# Patient Record
Sex: Male | Born: 1965
Health system: Southern US, Community
[De-identification: ages and names within clinical notes are randomized; demographics above are authoritative.]

## PROBLEM LIST (undated history)

## (undated) DIAGNOSIS — R519 Headache, unspecified: Secondary | ICD-10-CM

## (undated) DIAGNOSIS — I10 Essential (primary) hypertension: Secondary | ICD-10-CM

## (undated) DIAGNOSIS — E785 Hyperlipidemia, unspecified: Secondary | ICD-10-CM

## (undated) DIAGNOSIS — K429 Umbilical hernia without obstruction or gangrene: Secondary | ICD-10-CM

## (undated) DIAGNOSIS — J189 Pneumonia, unspecified organism: Secondary | ICD-10-CM

## (undated) DIAGNOSIS — A159 Respiratory tuberculosis unspecified: Secondary | ICD-10-CM

## (undated) DIAGNOSIS — Z8709 Personal history of other diseases of the respiratory system: Secondary | ICD-10-CM

## (undated) DIAGNOSIS — M1990 Unspecified osteoarthritis, unspecified site: Secondary | ICD-10-CM

## (undated) DIAGNOSIS — F259 Schizoaffective disorder, unspecified: Secondary | ICD-10-CM

## (undated) DIAGNOSIS — J449 Chronic obstructive pulmonary disease, unspecified: Secondary | ICD-10-CM

## (undated) DIAGNOSIS — R42 Dizziness and giddiness: Secondary | ICD-10-CM

## (undated) DIAGNOSIS — F329 Major depressive disorder, single episode, unspecified: Secondary | ICD-10-CM

## (undated) DIAGNOSIS — R51 Headache: Secondary | ICD-10-CM

## (undated) DIAGNOSIS — F319 Bipolar disorder, unspecified: Secondary | ICD-10-CM

## (undated) DIAGNOSIS — E119 Type 2 diabetes mellitus without complications: Secondary | ICD-10-CM

## (undated) DIAGNOSIS — K219 Gastro-esophageal reflux disease without esophagitis: Secondary | ICD-10-CM

## (undated) DIAGNOSIS — F32A Depression, unspecified: Secondary | ICD-10-CM

## (undated) DIAGNOSIS — F419 Anxiety disorder, unspecified: Secondary | ICD-10-CM

## (undated) DIAGNOSIS — F1910 Other psychoactive substance abuse, uncomplicated: Secondary | ICD-10-CM

## (undated) DIAGNOSIS — K649 Unspecified hemorrhoids: Secondary | ICD-10-CM

## (undated) DIAGNOSIS — M2550 Pain in unspecified joint: Secondary | ICD-10-CM

## (undated) DIAGNOSIS — M2540 Effusion, unspecified joint: Secondary | ICD-10-CM

## (undated) DIAGNOSIS — M549 Dorsalgia, unspecified: Secondary | ICD-10-CM

## (undated) HISTORY — DX: Headache: R51

## (undated) HISTORY — DX: Headache, unspecified: R51.9

## (undated) HISTORY — PX: TOTAL KNEE ARTHROPLASTY: SHX125

## (undated) HISTORY — PX: ESOPHAGOGASTRODUODENOSCOPY: SHX1529

## (undated) HISTORY — DX: Unspecified osteoarthritis, unspecified site: M19.90

## (undated) HISTORY — PX: JOINT REPLACEMENT: SHX530

## (undated) HISTORY — DX: Bipolar disorder, unspecified: F31.9

## (undated) HISTORY — PX: LIPOMA EXCISION: SHX5283

---

## 1998-03-07 ENCOUNTER — Emergency Department (HOSPITAL_COMMUNITY): Admission: EM | Admit: 1998-03-07 | Discharge: 1998-03-07 | Payer: Self-pay | Admitting: Emergency Medicine

## 1998-03-12 ENCOUNTER — Emergency Department (HOSPITAL_COMMUNITY): Admission: EM | Admit: 1998-03-12 | Discharge: 1998-03-12 | Payer: Self-pay | Admitting: Emergency Medicine

## 1998-03-19 ENCOUNTER — Emergency Department (HOSPITAL_COMMUNITY): Admission: EM | Admit: 1998-03-19 | Discharge: 1998-03-19 | Payer: Self-pay | Admitting: Emergency Medicine

## 1998-05-26 ENCOUNTER — Ambulatory Visit (HOSPITAL_COMMUNITY): Admission: RE | Admit: 1998-05-26 | Discharge: 1998-05-26 | Payer: Self-pay | Admitting: Family Medicine

## 1999-05-25 ENCOUNTER — Ambulatory Visit (HOSPITAL_COMMUNITY): Admission: RE | Admit: 1999-05-25 | Discharge: 1999-05-25 | Payer: Self-pay | Admitting: *Deleted

## 1999-11-26 ENCOUNTER — Emergency Department (HOSPITAL_COMMUNITY): Admission: EM | Admit: 1999-11-26 | Discharge: 1999-11-26 | Payer: Self-pay | Admitting: Emergency Medicine

## 2001-10-01 ENCOUNTER — Emergency Department (HOSPITAL_COMMUNITY): Admission: EM | Admit: 2001-10-01 | Discharge: 2001-10-01 | Payer: Self-pay

## 2001-10-06 ENCOUNTER — Emergency Department (HOSPITAL_COMMUNITY): Admission: EM | Admit: 2001-10-06 | Discharge: 2001-10-06 | Payer: Self-pay | Admitting: Emergency Medicine

## 2002-04-08 ENCOUNTER — Emergency Department (HOSPITAL_COMMUNITY): Admission: EM | Admit: 2002-04-08 | Discharge: 2002-04-08 | Payer: Self-pay | Admitting: *Deleted

## 2003-01-07 ENCOUNTER — Emergency Department (HOSPITAL_COMMUNITY): Admission: EM | Admit: 2003-01-07 | Discharge: 2003-01-07 | Payer: Self-pay | Admitting: Emergency Medicine

## 2004-07-16 ENCOUNTER — Emergency Department (HOSPITAL_COMMUNITY): Admission: EM | Admit: 2004-07-16 | Discharge: 2004-07-16 | Payer: Self-pay | Admitting: Emergency Medicine

## 2005-05-06 ENCOUNTER — Emergency Department (HOSPITAL_COMMUNITY): Admission: EM | Admit: 2005-05-06 | Discharge: 2005-05-06 | Payer: Self-pay | Admitting: Emergency Medicine

## 2005-07-14 ENCOUNTER — Emergency Department (HOSPITAL_COMMUNITY): Admission: EM | Admit: 2005-07-14 | Discharge: 2005-07-14 | Payer: Self-pay | Admitting: Family Medicine

## 2005-08-05 ENCOUNTER — Emergency Department (HOSPITAL_COMMUNITY): Admission: EM | Admit: 2005-08-05 | Discharge: 2005-08-06 | Payer: Self-pay | Admitting: Emergency Medicine

## 2005-08-12 ENCOUNTER — Emergency Department (HOSPITAL_COMMUNITY): Admission: EM | Admit: 2005-08-12 | Discharge: 2005-08-12 | Payer: Self-pay | Admitting: Family Medicine

## 2007-07-22 ENCOUNTER — Emergency Department (HOSPITAL_COMMUNITY): Admission: EM | Admit: 2007-07-22 | Discharge: 2007-07-22 | Payer: Self-pay | Admitting: Emergency Medicine

## 2008-08-31 ENCOUNTER — Emergency Department (HOSPITAL_COMMUNITY): Admission: EM | Admit: 2008-08-31 | Discharge: 2008-09-01 | Payer: Self-pay | Admitting: Emergency Medicine

## 2008-09-01 ENCOUNTER — Ambulatory Visit: Payer: Self-pay | Admitting: *Deleted

## 2008-09-01 ENCOUNTER — Inpatient Hospital Stay (HOSPITAL_COMMUNITY): Admission: RE | Admit: 2008-09-01 | Discharge: 2008-09-07 | Payer: Self-pay | Admitting: *Deleted

## 2008-12-20 ENCOUNTER — Encounter: Payer: Self-pay | Admitting: Family Medicine

## 2008-12-20 ENCOUNTER — Ambulatory Visit: Payer: Self-pay | Admitting: Family Medicine

## 2008-12-20 DIAGNOSIS — J209 Acute bronchitis, unspecified: Secondary | ICD-10-CM | POA: Insufficient documentation

## 2009-01-31 ENCOUNTER — Ambulatory Visit: Payer: Self-pay | Admitting: Family Medicine

## 2009-01-31 DIAGNOSIS — R0789 Other chest pain: Secondary | ICD-10-CM | POA: Insufficient documentation

## 2009-01-31 DIAGNOSIS — F17200 Nicotine dependence, unspecified, uncomplicated: Secondary | ICD-10-CM | POA: Insufficient documentation

## 2009-01-31 DIAGNOSIS — F319 Bipolar disorder, unspecified: Secondary | ICD-10-CM | POA: Insufficient documentation

## 2009-01-31 DIAGNOSIS — R030 Elevated blood-pressure reading, without diagnosis of hypertension: Secondary | ICD-10-CM | POA: Insufficient documentation

## 2009-01-31 DIAGNOSIS — Z9189 Other specified personal risk factors, not elsewhere classified: Secondary | ICD-10-CM | POA: Insufficient documentation

## 2009-01-31 DIAGNOSIS — L259 Unspecified contact dermatitis, unspecified cause: Secondary | ICD-10-CM | POA: Insufficient documentation

## 2009-02-01 ENCOUNTER — Telehealth: Payer: Self-pay | Admitting: Family Medicine

## 2009-02-01 ENCOUNTER — Encounter: Payer: Self-pay | Admitting: Family Medicine

## 2009-02-01 ENCOUNTER — Ambulatory Visit: Payer: Self-pay | Admitting: Family Medicine

## 2009-02-02 LAB — CONVERTED CEMR LAB
ALT: 17 units/L (ref 0–53)
Albumin: 4.3 g/dL (ref 3.5–5.2)
CO2: 25 meq/L (ref 19–32)
Calcium: 9.7 mg/dL (ref 8.4–10.5)
Chloride: 105 meq/L (ref 96–112)
Cholesterol: 165 mg/dL (ref 0–200)
Glucose, Bld: 101 mg/dL — ABNORMAL HIGH (ref 70–99)
Platelets: 280 10*3/uL (ref 150–400)
Potassium: 5.3 meq/L (ref 3.5–5.3)
RBC: 5.16 M/uL (ref 4.22–5.81)
Sodium: 139 meq/L (ref 135–145)
Total Protein: 7.2 g/dL (ref 6.0–8.3)
VLDL: 10 mg/dL (ref 0–40)
WBC: 6 10*3/uL (ref 4.0–10.5)

## 2009-02-07 ENCOUNTER — Telehealth: Payer: Self-pay | Admitting: Family Medicine

## 2009-02-07 ENCOUNTER — Encounter: Payer: Self-pay | Admitting: Family Medicine

## 2009-02-07 ENCOUNTER — Ambulatory Visit (HOSPITAL_COMMUNITY): Admission: RE | Admit: 2009-02-07 | Discharge: 2009-02-07 | Payer: Self-pay | Admitting: Family Medicine

## 2009-02-13 ENCOUNTER — Ambulatory Visit: Payer: Self-pay | Admitting: Family Medicine

## 2009-02-13 ENCOUNTER — Ambulatory Visit (HOSPITAL_COMMUNITY): Admission: RE | Admit: 2009-02-13 | Discharge: 2009-02-13 | Payer: Self-pay | Admitting: Family Medicine

## 2009-02-14 ENCOUNTER — Emergency Department (HOSPITAL_COMMUNITY): Admission: EM | Admit: 2009-02-14 | Discharge: 2009-02-14 | Payer: Self-pay | Admitting: Emergency Medicine

## 2009-02-17 ENCOUNTER — Encounter: Payer: Self-pay | Admitting: Family Medicine

## 2009-03-01 ENCOUNTER — Ambulatory Visit: Payer: Self-pay | Admitting: Sports Medicine

## 2009-03-09 ENCOUNTER — Emergency Department (HOSPITAL_COMMUNITY): Admission: EM | Admit: 2009-03-09 | Discharge: 2009-03-09 | Payer: Self-pay | Admitting: Emergency Medicine

## 2009-03-29 ENCOUNTER — Telehealth: Payer: Self-pay | Admitting: Family Medicine

## 2009-03-30 ENCOUNTER — Telehealth: Payer: Self-pay | Admitting: Family Medicine

## 2009-04-06 ENCOUNTER — Telehealth: Payer: Self-pay | Admitting: Family Medicine

## 2009-04-17 ENCOUNTER — Encounter: Payer: Self-pay | Admitting: Family Medicine

## 2009-04-25 ENCOUNTER — Ambulatory Visit: Payer: Self-pay | Admitting: Family Medicine

## 2009-04-25 DIAGNOSIS — M545 Low back pain, unspecified: Secondary | ICD-10-CM | POA: Insufficient documentation

## 2009-04-27 ENCOUNTER — Encounter: Payer: Self-pay | Admitting: Family Medicine

## 2009-05-01 ENCOUNTER — Encounter: Payer: Self-pay | Admitting: Family Medicine

## 2009-05-18 ENCOUNTER — Ambulatory Visit: Payer: Self-pay | Admitting: Family Medicine

## 2009-05-18 DIAGNOSIS — D1739 Benign lipomatous neoplasm of skin and subcutaneous tissue of other sites: Secondary | ICD-10-CM | POA: Insufficient documentation

## 2009-05-18 DIAGNOSIS — S143XXA Injury of brachial plexus, initial encounter: Secondary | ICD-10-CM | POA: Insufficient documentation

## 2009-05-22 ENCOUNTER — Encounter: Payer: Self-pay | Admitting: Family Medicine

## 2009-05-23 ENCOUNTER — Encounter: Payer: Self-pay | Admitting: Family Medicine

## 2009-05-25 ENCOUNTER — Telehealth: Payer: Self-pay | Admitting: Family Medicine

## 2009-05-25 ENCOUNTER — Encounter: Payer: Self-pay | Admitting: Family Medicine

## 2009-06-06 ENCOUNTER — Emergency Department (HOSPITAL_COMMUNITY): Admission: EM | Admit: 2009-06-06 | Discharge: 2009-06-07 | Payer: Self-pay | Admitting: Emergency Medicine

## 2009-07-04 ENCOUNTER — Encounter: Admission: RE | Admit: 2009-07-04 | Discharge: 2009-07-04 | Payer: Self-pay | Admitting: Surgery

## 2009-07-06 ENCOUNTER — Encounter (INDEPENDENT_AMBULATORY_CARE_PROVIDER_SITE_OTHER): Payer: Self-pay | Admitting: Surgery

## 2009-07-06 ENCOUNTER — Ambulatory Visit (HOSPITAL_BASED_OUTPATIENT_CLINIC_OR_DEPARTMENT_OTHER): Admission: RE | Admit: 2009-07-06 | Discharge: 2009-07-06 | Payer: Self-pay | Admitting: Surgery

## 2009-08-01 ENCOUNTER — Ambulatory Visit: Payer: Self-pay | Admitting: Family Medicine

## 2009-08-31 ENCOUNTER — Emergency Department (HOSPITAL_COMMUNITY): Admission: EM | Admit: 2009-08-31 | Discharge: 2009-08-31 | Payer: Self-pay | Admitting: Emergency Medicine

## 2009-09-07 ENCOUNTER — Encounter: Payer: Self-pay | Admitting: Family Medicine

## 2009-09-07 ENCOUNTER — Emergency Department (HOSPITAL_COMMUNITY): Admission: EM | Admit: 2009-09-07 | Discharge: 2009-09-07 | Payer: Self-pay | Admitting: Emergency Medicine

## 2010-02-13 ENCOUNTER — Encounter: Payer: Self-pay | Admitting: Family Medicine

## 2010-02-13 ENCOUNTER — Ambulatory Visit: Payer: Self-pay | Admitting: Family Medicine

## 2010-02-13 DIAGNOSIS — F528 Other sexual dysfunction not due to a substance or known physiological condition: Secondary | ICD-10-CM | POA: Insufficient documentation

## 2010-02-13 LAB — CONVERTED CEMR LAB: Testosterone: 459.89 ng/dL (ref 350–890)

## 2010-02-14 ENCOUNTER — Telehealth: Payer: Self-pay | Admitting: Family Medicine

## 2010-02-22 ENCOUNTER — Telehealth: Payer: Self-pay | Admitting: Family Medicine

## 2010-03-14 ENCOUNTER — Emergency Department (HOSPITAL_COMMUNITY): Admission: EM | Admit: 2010-03-14 | Discharge: 2010-03-14 | Payer: Self-pay | Admitting: Family Medicine

## 2010-03-20 ENCOUNTER — Ambulatory Visit: Payer: Self-pay | Admitting: Family Medicine

## 2010-03-20 ENCOUNTER — Encounter: Payer: Self-pay | Admitting: Family Medicine

## 2010-03-20 DIAGNOSIS — S6990XA Unspecified injury of unspecified wrist, hand and finger(s), initial encounter: Secondary | ICD-10-CM | POA: Insufficient documentation

## 2010-03-30 ENCOUNTER — Encounter: Payer: Self-pay | Admitting: Family Medicine

## 2010-03-30 ENCOUNTER — Ambulatory Visit: Payer: Self-pay | Admitting: Family Medicine

## 2010-03-30 DIAGNOSIS — N139 Obstructive and reflux uropathy, unspecified: Secondary | ICD-10-CM | POA: Insufficient documentation

## 2010-04-02 LAB — CONVERTED CEMR LAB: PSA: 1.5 ng/mL (ref 0.10–4.00)

## 2010-04-04 ENCOUNTER — Telehealth: Payer: Self-pay | Admitting: Family Medicine

## 2010-06-24 ENCOUNTER — Emergency Department (HOSPITAL_COMMUNITY): Admission: EM | Admit: 2010-06-24 | Discharge: 2010-06-24 | Payer: Self-pay | Admitting: Emergency Medicine

## 2010-07-02 ENCOUNTER — Telehealth: Payer: Self-pay | Admitting: Family Medicine

## 2010-07-09 ENCOUNTER — Emergency Department (HOSPITAL_COMMUNITY): Admission: EM | Admit: 2010-07-09 | Discharge: 2010-07-09 | Payer: Self-pay | Admitting: Family Medicine

## 2010-07-09 ENCOUNTER — Encounter: Payer: Self-pay | Admitting: Family Medicine

## 2010-10-17 ENCOUNTER — Ambulatory Visit: Admit: 2010-10-17 | Payer: Self-pay

## 2010-11-05 ENCOUNTER — Encounter: Payer: Self-pay | Admitting: Family Medicine

## 2010-11-05 ENCOUNTER — Ambulatory Visit
Admission: RE | Admit: 2010-11-05 | Discharge: 2010-11-05 | Payer: Self-pay | Source: Home / Self Care | Attending: Family Medicine | Admitting: Family Medicine

## 2010-11-05 DIAGNOSIS — R05 Cough: Secondary | ICD-10-CM | POA: Insufficient documentation

## 2010-11-05 DIAGNOSIS — IMO0001 Reserved for inherently not codable concepts without codable children: Secondary | ICD-10-CM | POA: Insufficient documentation

## 2010-11-05 LAB — CONVERTED CEMR LAB
Hemoglobin: 15.4 g/dL (ref 13.0–17.0)
RBC: 4.93 M/uL (ref 4.22–5.81)
RDW: 14.1 % (ref 11.5–15.5)
WBC: 6.9 10*3/uL (ref 4.0–10.5)

## 2010-11-05 LAB — CBC
Hemoglobin: 15.4 g/dL (ref 13.0–17.0)
MCV: 94.7 fL (ref 78.0–100.0)
Platelets: 279 10*3/uL (ref 150–400)
RBC: 4.93 MIL/uL (ref 4.22–5.81)
WBC: 6.9 10*3/uL (ref 4.0–10.5)

## 2010-11-06 ENCOUNTER — Ambulatory Visit (HOSPITAL_COMMUNITY)
Admission: RE | Admit: 2010-11-06 | Discharge: 2010-11-06 | Payer: Self-pay | Source: Home / Self Care | Attending: Family Medicine | Admitting: Family Medicine

## 2010-11-06 NOTE — Progress Notes (Signed)
Summary: triage  Phone Note Call from Patient Call back at Home Phone [phone]   Caller: Patient Summary of Call: Pt saying he has a breathing problem and needs to be seen also a rash. Initial call taken by: Clydell Hakim,  July 02, 2010 2:24 PM  Follow-up for Phone Call        states he has been sob x 2 wks. usually smokes 3-4 cigarettes per day.  told him we have no appts . he will use UC today Follow-up by: Golden Circle RN,  July 02, 2010 2:26 PM

## 2010-11-06 NOTE — Progress Notes (Signed)
Summary: pls call  Phone Note Call from Patient Call back at [phone]   Caller: Patient Summary of Call: wants to talk to Dr Wallene Huh - wants cheaper meds - cannot afford one given him Initial call taken by: De Nurse,  Feb 14, 2010 9:02 AM  Follow-up for Phone Call        Discussed that PDE-I meds are generally expensive. Discussed that he should talk with the physician that prescribed his neurontin about a trial of holding this medication to see if it might help.  Follow-up by: Bobby Rumpf  MD,  Feb 14, 2010 12:13 PM

## 2010-11-06 NOTE — Miscellaneous (Signed)
Summary: he is at [facility]  Clinical Lists Changes UC called for NPI to see him. as we are out of appts,, I ok'd the visit.Golden Circle RN  July 09, 2010 1:39 PM

## 2010-11-06 NOTE — Assessment & Plan Note (Signed)
Summary: f/u UC visit - bilateral hand injury,df   Vital Signs:  Patient profile:   45 year old male Weight:      213.8 pounds Temp:     98.2 degrees F Pulse rate:   68 / minute BP sitting:   124 / 84  (right arm)  Vitals Entered By: Starleen Blue RN (March 20, 2010 1:41 PM) CC: f/u hands Is Patient Diabetic? No Pain Assessment Patient in pain? no        Primary Care Provider:  Bobby Rumpf  MD  CC:  f/u hands.  History of Present Illness: 1) Injury to hands: Seen at Urgent Care on 03/14/10 for puncture wounds from nails while at [employer] store to bilateral hands (sustained on 03/14/10). Received tetanus booster, doxycycline x 10 days, ibuprofen as needed. Bilateral films w/o foreign body or bony injury. Injuries were to dorsum of left index finger at PIP and dorsum of right hand at 4th MCP.  Pain has mostly improved. Patient denies pus, streaking erythema, fever, chills, decreased range of motion, loss of sensation, weakness. Needs a note from work stating extent of injuries and also clearing him for return to work.   Habits & Providers  Alcohol-Tobacco-Diet     Tobacco Status: current     Cigarette Packs/Day: 0.25  Current Medications (verified): 1)  Viagra 25 Mg Tabs (Sildenafil Citrate) .... One Tab By Mouth Qday As Needed For Intercourse 2)  Neurontin 300 Mg Caps (Gabapentin) .... One Tab By Mouth Three Times A Day 3)  Doxycycline Hyclate 100 Mg Tabs (Doxycycline Hyclate) .... One Tab By Mouth Two Times A Day X 10 Days  Allergies (verified): 1)  ! Penicillin  Social History: Packs/Day:  0.25  Review of Systems       as per HPI   Physical Exam  General:  NAD, pleasant  Mouth:  full ROM  Neck:  full ROM  Extremities:  hands with well healed wounds  ~ 5 mm in diameter at dorsum of left index finger at PIP and dorsum of right hand at 4th MCP.  Neurologic:  normal strength in fingers; sensation intact in fingers    Impression & Recommendations:  Problem # 1:   HAND INJURY (ICD-959.4) Assessment New  Healing well w/o sequelae. No further intervention required. Continue doxycycline for complete course. Ibuprofen as needed. Note written to clear patient for work today.   Orders: [facility]- Est Level  3 (81191)  Complete Medication List: 1)  Viagra 25 Mg Tabs (Sildenafil citrate) .... One tab by mouth qday as needed for intercourse 2)  Neurontin 300 Mg Caps (Gabapentin) .... One tab by mouth three times a day 3)  Doxycycline Hyclate 100 Mg Tabs (Doxycycline hyclate) .... One tab by mouth two times a day x 10 days

## 2010-11-06 NOTE — Progress Notes (Signed)
Summary: Rx Req  Phone Note Call from Patient Call back at [phone]   Caller: Patient Summary of Call: Would like his Vigria sent to [facility] [address]. Initial call taken by: Clydell Hakim,  Feb 22, 2010 11:46 AM    Prescriptions: VIAGRA 25 MG TABS (SILDENAFIL CITRATE) one tab by mouth qday as needed for intercourse  #30 x 1   Entered and Authorized by:   Bobby Rumpf  MD   Signed by:   Bobby Rumpf  MD on 02/23/2010   Method used:   Electronically to        [facility] [phone]* (retail)       [address]       [place], [place]  [address]       Ph: [phone]       Fax: [phone]   RxID:   [id number]   Appended Document: Rx Req LM that rx ready for pick up.

## 2010-11-06 NOTE — Assessment & Plan Note (Signed)
Summary: stress problems,df   Vital Signs:  Patient profile:   45 year old male Height:      68 inches Weight:      218.2 pounds BMI:     33.30 Temp:     98.3 degrees F oral Pulse rate:   97 / minute BP sitting:   128 / 87  (left arm) Cuff size:   regular  Vitals Entered By: Garen Grams LPN (Feb 13, 2010 2:35 PM) CC: erectile dysfunction  Is Patient Diabetic? No Pain Assessment Patient in pain? no        Primary Care Provider:  Bobby Rumpf  MD  CC:  erectile dysfunction .  History of Present Illness: 1) Erectile dysfunction: Reports difficulty attaining / maintaining erection x 1 month. Recently incarcerated (released 1 month ago) - since then he has had above difficulties. Heterosexual. One partner. Denies penile pain, testicular pain / atrophy, gynecomastia, breast discharge. Does not think his problem may be psychogenic. Only meds are neurontin which he has been on for several months (not prescribed by me) and trazodone, which he has been on for at least 6 months. Denies alcohol, drug use. Reports tobacco use, one cigarette per day.   Habits & Providers  Alcohol-Tobacco-Diet     Tobacco Status: current     Tobacco Counseling: to quit use of tobacco products  Current Medications (verified): 1)  Viagra 25 Mg Tabs (Sildenafil Citrate) .... One Tab By Mouth Qday As Needed For Intercourse 2)  Neurontin 300 Mg Caps (Gabapentin) .... One Tab By Mouth Three Times A Day  Allergies (verified): 1)  ! Penicillin  Physical Exam  General:  vitals reviewed.  NAD  Breasts:  no gynecomastia and no masses.   Lungs:  Normal respiratory effort, chest expands symmetrically. Lungs are clear to auscultation, no crackles or wheezes. Heart:  Normal rate and regular rhythm. S1 and S2 normal without gallop, murmur, click, rub or other extra sounds. Abdomen:  Bowel sounds positive,abdomen soft and non-tender without masses, organomegaly or hernias noted. Genitalia:  circumcised, no  hydrocele, no scrotal masses, no testicular masses or atrophy or pain, and no urethral discharge.  no external abnormalities of penis. Msk:  No deformity or scoliosis noted of thoracic or lumbar spine.   Neurologic:  alert & oriented X3, cranial nerves II-XII intact, strength normal in all extremities, sensation intact to light touch, sensation intact to pinprick, and DTRs symmetrical and normal.     Impression & Recommendations:  Problem # 1:  ERECTILE DYSFUNCTION, PSYCHOGENIC (ICD-302.72) Assessment New Suspect psychogenic vs iatrogenic (on Neurontin) ED. Will check testosterone level, but exam and history not consistent with hormone abnormalities. Will prescribe Viagra at patient request. Advised regarding discussing issue with partner as well as he has been doing to see if this might be helpful. Follow up in 4-6 months.   His updated medication list for this problem includes:    Viagra 25 Mg Tabs (Sildenafil citrate) ..... One tab by mouth qday as needed for intercourse  Orders: Testosterone-[facility] ([id number]) [facility]- Est Level  3 (13086)  Complete Medication List: 1)  Viagra 25 Mg Tabs (Sildenafil citrate) .... One tab by mouth qday as needed for intercourse 2)  Neurontin 300 Mg Caps (Gabapentin) .... One tab by mouth three times a day Prescriptions: VIAGRA 25 MG TABS (SILDENAFIL CITRATE) one tab by mouth qday as needed for intercourse  #30 x 1   Entered and Authorized by:   Bobby Rumpf  MD   Signed by:  Bobby Rumpf  MD on 02/13/2010   Method used:   Electronically to        [facility] [address] [phone]* (retail)       [address]       [place], [place]  [id number]       Ph: [phone] or [phone]       Fax: [phone]   RxID:   [id number]

## 2010-11-06 NOTE — Assessment & Plan Note (Signed)
Summary: urinary obstruction/eo   Vital Signs:  Patient profile:   45 year old male Weight:      213.6 pounds Temp:     98 degrees F oral Pulse rate:   81 / minute Pulse rhythm:   regular BP sitting:   116 / 71  (left arm) Cuff size:   large  Vitals Entered By: Garen Grams LPN (March 30, 2010 2:29 PM)  Primary Care Provider:  Bobby Rumpf  MD   History of Present Illness: 1) Occasional urinary obstruction: Notes occasional halting urinary stream (occurs less than once a week). Denies nocturia, increased frequency of urination, new medications, fever, dysuria, hematuria, back pain, saddle anesthesia. Unsure about family history of prostate cancer. Has a history of erectile dysfunction that started this year as well after he was released from prison.   Current Medications (verified): 1)  Viagra 25 Mg Tabs (Sildenafil Citrate) .... One Tab By Mouth Qday As Needed For Intercourse 2)  Neurontin 300 Mg Caps (Gabapentin) .... One Tab By Mouth Three Times A Day  Allergies (verified): 1)  ! Penicillin  Physical Exam  General:  NAD, pleasant  Rectal:  DECLINES rectal exam  Genitalia:  circumcised, no hydrocele, no scrotal masses, no testicular masses or atrophy or pain, and no urethral discharge.  no external abnormalities of penis.   Impression & Recommendations:  Problem # 1:  URINARY OBSTRUCTION UNSPECIFIED (ICD-599.60) Assessment New Mild at most. Uncertain etiology. Declines exam. No meds on list that would cause. Patient requests "test for prostate cancer". Will perform PSA today given symptoms, patient reluctance to do exam, patient desire for PSA test for prostate cancer as well. Discussed risk/benefit. Will proceed with testing.   Orders: PSA-[facility] ([id number]) [facility]- Est Level  3 (09811)  Complete Medication List: 1)  Viagra 25 Mg Tabs (Sildenafil citrate) .... One tab by mouth qday as needed for intercourse 2)  Neurontin 300 Mg Caps (Gabapentin) .... One tab by mouth three  times a day

## 2010-11-06 NOTE — Progress Notes (Signed)
Summary: phn msg  Phone Note Other Incoming Call back at [phone] Ext [phone]   Caller: [employer] Claims Summary of Call: Needs to talk to Dr. Wallene Huh about letter that was written.  Needs clarification on some items in the letter.    Initial call taken by: Clydell Hakim,  April 04, 2010 3:25 PM  Follow-up for Phone Call        Called to clarify - date of office visit was incorrect - clarified this with claims adjuster. Dates in letter that patient was out of work due to this injury are correct and were dates given to me by patient's supervisor as the dates that should be listed in the letter.  Follow-up by: Bobby Rumpf  MD,  April 04, 2010 3:51 PM

## 2010-11-06 NOTE — Letter (Signed)
Summary: Generic Letter  [facility]  [address]   [place], [place] [address]   Phone: [phone]  Fax: [phone]    03/20/2010  Ammar Lamorte [address] [place], [place]  [address]  To whom it may concern:  This is to certify that the aforementioned patient, Andrew [name], was seen on 03/22/10 at [facility] for injuries sustained to bilateral hands on 03/14/10 and that these injuries were examined and found to be healing well without sequelae. Please note that the aforementioned patient should be excused from work from 03/15/10 to 03/19/10 as a result of these injuries and is currently cleared to return to work starting today 03/20/10. Please feel free to direct any correspondence or questions to the address and/or phone number provided.            Sincerely,   Bobby Rumpf  MD

## 2010-11-14 NOTE — Assessment & Plan Note (Signed)
Summary: chest pain,df   Vital Signs:  Patient profile:   45 year old male Height:      68 inches Weight:      204.9 pounds BMI:     31.27 Temp:     97.9 degrees F oral Pulse rate:   69 / minute BP sitting:   121 / 84  (left arm) Cuff size:   large  Vitals Entered By: Jimmy Footman, CMA (November 05, 2010 10:25 AM) CC: chest pain, cough(wet), sore throat, bodyaches x2 weeks Is Patient Diabetic? No   Primary Provider:  Bobby Rumpf  MD  CC:  chest pain, cough(wet), sore throat, and bodyaches x2 weeks.  History of Present Illness: Pt presents today with a history of a cough, present for 4-5 months, productive for about the past 4-6 weeks, body aches and chest pain for 2-3 weeks, and occasional wheezing with exertion over the past several months.  Pt denies fevers/chills/headache/visual changes/SOB/nausea/vomiting/diarrhea but complains of back pain, neck pain, and low energy.  No changes in weight recently.  He has tried naproxen, ibuprofen, and percocet for pain, with only percocent helping.  He has not tried tylenol and does not like taking it.  No recent injuries.  Preventive Screening-Counseling & Management  Alcohol-Tobacco     Smoking Status: current  Current Medications (verified): 1)  Viagra 25 Mg Tabs (Sildenafil Citrate) .... One Tab By Mouth Qday As Needed For Intercourse 2)  Neurontin 300 Mg Caps (Gabapentin) .... One Tab By Mouth Three Times A Day 3)  Meloxicam 15 Mg Tabs (Meloxicam) .... Take One Daily For Body Aches  Allergies (verified): 1)  ! Penicillin  Past History:  Past medical, surgical, family and social histories (including risk factors) reviewed for relevance to current acute and chronic problems.  Past Medical History: Reviewed history from 12/20/2008 and no changes required. Bipolar disorder- pt being seen at [facility] for this problem  Family History: Reviewed history from 01/31/2009 and no changes required. Family History of CAD   -Father  MI age 83 Family History Diabetes 1st degree relative- brother and sister Family History Hypertension- father, mother  Social History: Reviewed history from 01/31/2009 and no changes required. Pt lives alone.  Per pt, he is on disability due to his mood disorder.  Worked in [employer] He enjoys watching movies or exercising 3 times per week.  He smokes less than 1/4 pack of cigarettes per day, drinks no alcohol, and quit using drugs -ie cocaine 6 months ago.   Review of Systems       The patient complains of chest pain.  The patient denies anorexia, fever, weight loss, weight gain, vision loss, decreased hearing, hoarseness, syncope, dyspnea on exertion, peripheral edema, prolonged cough, headaches, hemoptysis, abdominal pain, melena, hematochezia, severe indigestion/heartburn, hematuria, incontinence, muscle weakness, difficulty walking, and unusual weight change.    Physical Exam  General:  NAD, pleasant  Head:  normocephalic, atraumatic, and no abnormalities observed.   Neck:  full ROM  Chest Wall:  no deformities.  Some tenderness to palpation throughout. Lungs:  Normal respiratory effort, chest expands symmetrically. Lungs are clear to auscultation, no crackles or wheezes. Heart:  Normal rate and regular rhythm. S1 and S2 normal without gallop, murmur, click, rub or other extra sounds. Abdomen:  Bowel sounds positive,abdomen soft without masses, organomegaly or hernias noted.  Some diffuse, non-focal tenderness in bilateral flanks. Msk:  normal ROM, no joint tenderness, and no joint swelling.  Some diffuse paraspinal tenderness. Pulses:  DP and  radial pulses 2+ bilaterally Extremities:  No edema bilaterally Neurologic:  alert & oriented X3, cranial nerves II-XII intact, strength normal in all extremities, and sensation intact to light touch.   Skin:  color normal, no rashes, no suspicious lesions, and tattoo(s).     Impression & Recommendations:  Problem # 1:  MYALGIA  (ICD-729.1) Feel that this patients body aches are not likely to be caused by a malignent process and is likely a prolonged viral syndrom.  Will obtain a CBC and CK to make certain he is not having significant muscle breakdown or other inflammatory process.  Will give meloxicam for his pain.  Pt did request percocet this visit.  Review of records indicates that the patient was given percocet in the past for an acute injury.  It was made evident in the note that this was a limited, one week supply.  Do not feel comfortable giving this medication without consultation with PCP.  Will recommend that he follow up with PCP in 1-2 weeks if he is not feeling better by that time.  Orders: CBC-[facility] (81191) CXR- 2view (CXR)  His updated medication list for this problem includes:    Meloxicam 15 Mg Tabs (Meloxicam) .Marland Kitchen... Take one daily for body aches  Problem # 2:  COUGH, CHRONIC (ICD-786.2) Due to the patient's history of chronic cough and smoking will order CXR to make certain that he does not have a significant pulmonary process causing his symptoms.  Do not think this is likely due to his normal physical exam, but in a patient with this history, it is hard to rule out the possibility of malignancy.  Orders: CBC-[facility] (47829) CXR- 2view (CXR)  Complete Medication List: 1)  Viagra 25 Mg Tabs (Sildenafil citrate) .... One tab by mouth qday as needed for intercourse 2)  Neurontin 300 Mg Caps (Gabapentin) .... One tab by mouth three times a day 3)  Meloxicam 15 Mg Tabs (Meloxicam) .... Take one daily for body aches  Other Orders: CK (Creatine Kinase)-[facility] [phone])  Patient Instructions: 1)  It was good to see you today. 2)  We will get some blood work to make sure you don't have something bad causing your symptoms.  I would also like to get a chest x-ray to make sure you don't have something bad causing your cough. 3)  We will try Mobic once daily for your pain. 4)  If you are still having these  problems in a week, we need to see you back so we can investigate further.  Try to see your regular doctor then. Prescriptions: MELOXICAM 15 MG TABS (MELOXICAM) Take one daily for body aches  #30 x 0   Entered and Authorized by:   Majel Homer MD   Signed by:   Majel Homer MD on 11/05/2010   Method used:   Print then Give to Patient   RxID:   [id number]    Orders Added: 1)  CBC-[facility] [85027] 2)  CK (Creatine Kinase)-[facility] [[id number]] 3)  CXR- 2view [CXR]  Appended Document: Orders Update    Clinical Lists Changes  Orders: Added new Test order of [facility]- Est Level  3 ([address]27) - Signed

## 2010-12-20 LAB — DIFFERENTIAL
Basophils Absolute: 0 10*3/uL (ref 0.0–0.1)
Basophils Relative: 0 % (ref 0–1)
Neutro Abs: 2.7 10*3/uL (ref 1.7–7.7)
Neutrophils Relative %: 47 % (ref 43–77)

## 2010-12-20 LAB — CBC
Hemoglobin: 16.2 g/dL (ref 13.0–17.0)
MCH: 32.9 pg (ref 26.0–34.0)
RBC: 4.92 MIL/uL (ref 4.22–5.81)

## 2010-12-20 LAB — BASIC METABOLIC PANEL
CO2: 25 mEq/L (ref 19–32)
Calcium: 9.2 mg/dL (ref 8.4–10.5)
GFR calc Af Amer: >60 mL/min (ref >60)
GFR calc non Af Amer: >60 mL/min (ref >60)
Sodium: 138 mEq/L (ref 135–145)

## 2010-12-20 LAB — CK TOTAL AND CKMB (NOT AT ARMC)
CK, MB: 2.7 ng/mL (ref 0.3–4.0)
Total CK: 391 U/L — ABNORMAL HIGH (ref 7–232)

## 2010-12-20 LAB — D-DIMER, QUANTITATIVE: D-Dimer, Quant: <0.22 ug/mL-FEU (ref 0.00–0.48)

## 2010-12-30 ENCOUNTER — Emergency Department (HOSPITAL_COMMUNITY)
Admission: EM | Admit: 2010-12-30 | Discharge: 2010-12-30 | Disposition: A | Discharge status: Home / Self Care | Payer: Medicare Other | Attending: Emergency Medicine | Admitting: Emergency Medicine

## 2010-12-30 ENCOUNTER — Emergency Department (HOSPITAL_COMMUNITY): Payer: Medicare Other

## 2010-12-30 DIAGNOSIS — M545 Low back pain, unspecified: Secondary | ICD-10-CM | POA: Insufficient documentation

## 2010-12-30 DIAGNOSIS — G8929 Other chronic pain: Secondary | ICD-10-CM | POA: Insufficient documentation

## 2010-12-30 DIAGNOSIS — I2510 Atherosclerotic heart disease of native coronary artery without angina pectoris: Secondary | ICD-10-CM | POA: Insufficient documentation

## 2010-12-30 DIAGNOSIS — R7309 Other abnormal glucose: Secondary | ICD-10-CM | POA: Insufficient documentation

## 2010-12-30 DIAGNOSIS — F3289 Other specified depressive episodes: Secondary | ICD-10-CM | POA: Insufficient documentation

## 2010-12-30 DIAGNOSIS — R1909 Other intra-abdominal and pelvic swelling, mass and lump: Secondary | ICD-10-CM | POA: Insufficient documentation

## 2010-12-30 DIAGNOSIS — R350 Frequency of micturition: Secondary | ICD-10-CM | POA: Insufficient documentation

## 2010-12-30 DIAGNOSIS — R109 Unspecified abdominal pain: Secondary | ICD-10-CM | POA: Insufficient documentation

## 2010-12-30 DIAGNOSIS — F329 Major depressive disorder, single episode, unspecified: Secondary | ICD-10-CM | POA: Insufficient documentation

## 2010-12-30 LAB — COMPREHENSIVE METABOLIC PANEL
AST: 26 U/L (ref 0–37)
Albumin: 3.6 g/dL (ref 3.5–5.2)
Chloride: 107 mEq/L (ref 96–112)
Creatinine, Ser: 0.85 mg/dL (ref 0.4–1.5)
GFR calc Af Amer: >60 mL/min (ref >60)
Potassium: 4.1 mEq/L (ref 3.5–5.1)
Total Bilirubin: 0.7 mg/dL (ref 0.3–1.2)
Total Protein: 6.3 g/dL (ref 6.0–8.3)

## 2010-12-30 LAB — URINALYSIS, ROUTINE W REFLEX MICROSCOPIC
Bilirubin Urine: NEGATIVE
Ketones, ur: NEGATIVE mg/dL
Nitrite: NEGATIVE
Specific Gravity, Urine: 1.025 (ref 1.005–1.030)
Urobilinogen, UA: 1 mg/dL (ref 0.0–1.0)

## 2010-12-30 LAB — DIFFERENTIAL
Basophils Relative: 1 % (ref 0–1)
Eosinophils Absolute: 0.2 10*3/uL (ref 0.0–0.7)
Neutrophils Relative %: 44 % (ref 43–77)

## 2010-12-30 LAB — CBC
MCH: 32 pg (ref 26.0–34.0)
Platelets: 255 10*3/uL (ref 150–400)
RBC: 4.75 MIL/uL (ref 4.22–5.81)
WBC: 5.3 10*3/uL (ref 4.0–10.5)

## 2011-01-04 ENCOUNTER — Ambulatory Visit (INDEPENDENT_AMBULATORY_CARE_PROVIDER_SITE_OTHER): Payer: Medicare Other | Admitting: Family Medicine

## 2011-01-04 VITALS — BP 124/53 | HR 61 | Temp 98.2°F | Wt 211.2 lb

## 2011-01-04 DIAGNOSIS — K439 Ventral hernia without obstruction or gangrene: Secondary | ICD-10-CM

## 2011-01-04 NOTE — Progress Notes (Signed)
  Subjective:    Patient ID: Andrew [name], male    DOB: 16-Apr-1966, 45 y.o.   MRN: [id number]  HPI  1) "Knot" on abdomen: Seen 12/30/10 for abdominal mass - noticed a small "knot" just above his umbilicus while he was showering - was not painful, but as he thought more about it, he felt as though he was having sharp pains in that area so he went to the ER. Review of ER report reveals diagnosis of likely abdominal wall hernia. Labs and studies at that time did not show any acute event (report of hyperglycemia on complete metabolic panel with glucose of 125 but patient reports that he had eaten shortly before going to ER). Denies nausea, emesis, diarrhea, constipation , melena, hematochezia.   Review of Systems As per HPI also positive for chronic low back pain.     Objective:   Physical Exam  Constitutional: He appears well-developed and well-nourished.  Cardiovascular: Normal rate and regular rhythm.   Abdominal: Soft. Bowel sounds are normal. He exhibits no distension. There is no tenderness. There is no rebound and no guarding.       2 cm by 2 cm abdominal wall hernia easily reducible just superior to umbilicus, non tender           Assessment & Plan:

## 2011-01-04 NOTE — Patient Instructions (Signed)
  Follow up at next appointment - we will check labs then.    Hernia A hernia occurs when an internal organ pushes out through a weak spot in the belly (abdominal) wall. Hernias most commonly occur in the groin and around the navel. Hernias also can occur through by cut (incision) made by the surgeon after an abdominal operation. Hernias often can be pushed back into place (reduced). Most hernias tend to get worse over time. Problems occur when abdominal contents get stuck in the opening (incarcerated hernia). The blood supply becomes blocked or impaired (strangulated hernia). Because of these risks, you may require surgery to repair the hernia. CAUSES  Heavy lifting.   Prolonged coughing.   Straining to move your bowels.   Hernias can also occur through a cut (incision) by a surgeon after an abdominal operation.  HOME CARE INSTRUCTIONS  Bed rest is not required. You may continue your normal activities. Avoid heavy lifting (more than 10 pounds) or straining. Cough gently. If you are a smoker it is best to stop. Even the best hernia repair can break down with the continual strain of coughing. Even if you do not have your hernia repaired, a cough will continue to aggravate the problem.   Do not wear anything tight over your hernia. Do not try to keep it in with an outside bandage or truss. These can damage abdominal contents if they are trapped within the hernia sac.   Eat a normal diet. Avoid constipation. Straining over long periods of time will increase hernia size and encourage breakdown of repairs. If you cannot do this with diet alone, stool softeners may be used.  SEEK IMMEDIATE MEDICAL CARE IF: You have problems (symptoms) of a trapped (incarcerated) hernia.   You develop increasing abdominal pain.   You feel sick to your stomach (nausea) and vomiting.   The hernia is stuck outside the abdomen, looks discolored, feels hard, or is tender.   You have any changes in your bowel habits  or in the hernia that is unusual for you.   You have increased pain or swelling around the hernia.   You cannot push the hernia back in place by applying gentle pressure while lying down.  MAKE SURE YOU:   Understand these instructions.   Will watch your condition.   Will get help right away if you are not doing well or get worse.  Document Released: 09/23/2005 Document Re-Released: 07/21/2009 [facility] Patient Information 2011 [place], [place].

## 2011-01-05 DIAGNOSIS — K439 Ventral hernia without obstruction or gangrene: Secondary | ICD-10-CM | POA: Insufficient documentation

## 2011-01-05 NOTE — Assessment & Plan Note (Signed)
New problem.   Easily reducible, no red flags. Mr. Decesare is somewhat anxious about this and requests an abdominal binder (this was suggested by PA at [facility] ER) so that he will not think about it as much. Script for binder given. Handout given. Reviewed red flags. Follow up in one month for labs as requested by psychiatrist, physical.

## 2011-01-08 LAB — URINALYSIS, ROUTINE W REFLEX MICROSCOPIC
Nitrite: NEGATIVE
Specific Gravity, Urine: 1.022 (ref 1.005–1.030)
pH: 6.5 (ref 5.0–8.0)

## 2011-01-09 LAB — CBC
MCHC: 34.8 g/dL (ref 30.0–36.0)
MCV: 93.6 fL (ref 78.0–100.0)
Platelets: 222 10*3/uL (ref 150–400)

## 2011-01-09 LAB — LIPASE, BLOOD: Lipase: 30 U/L (ref 11–59)

## 2011-01-09 LAB — COMPREHENSIVE METABOLIC PANEL
AST: 46 U/L — ABNORMAL HIGH (ref 0–37)
CO2: 25 mEq/L (ref 19–32)
Calcium: 8.9 mg/dL (ref 8.4–10.5)
Creatinine, Ser: 0.8 mg/dL (ref 0.4–1.5)
GFR calc Af Amer: >60 mL/min (ref >60)
GFR calc non Af Amer: >60 mL/min (ref >60)

## 2011-01-09 LAB — URINALYSIS, ROUTINE W REFLEX MICROSCOPIC
Nitrite: NEGATIVE
Specific Gravity, Urine: 1.029 (ref 1.005–1.030)
Urobilinogen, UA: 1 mg/dL (ref 0.0–1.0)

## 2011-01-09 LAB — DIFFERENTIAL
Eosinophils Relative: 3 % (ref 0–5)
Lymphocytes Relative: 28 % (ref 12–46)
Lymphs Abs: 1.1 10*3/uL (ref 0.7–4.0)
Neutro Abs: 2.2 10*3/uL (ref 1.7–7.7)

## 2011-01-09 LAB — URINE MICROSCOPIC-ADD ON

## 2011-01-11 LAB — BASIC METABOLIC PANEL
BUN: 10 mg/dL (ref 6–23)
Chloride: 106 mEq/L (ref 96–112)
GFR calc Af Amer: >60 mL/min (ref >60)
Potassium: 3.8 mEq/L (ref 3.5–5.1)
Sodium: 137 mEq/L (ref 135–145)

## 2011-01-11 LAB — CBC
HCT: 48.9 % (ref 39.0–52.0)
Hemoglobin: 16.7 g/dL (ref 13.0–17.0)
MCV: 95.5 fL (ref 78.0–100.0)
RBC: 5.13 MIL/uL (ref 4.22–5.81)
WBC: 6.2 10*3/uL (ref 4.0–10.5)

## 2011-01-11 LAB — DIFFERENTIAL
Eosinophils Absolute: 0.1 10*3/uL (ref 0.0–0.7)
Eosinophils Relative: 2 % (ref 0–5)
Lymphocytes Relative: 30 % (ref 12–46)
Lymphs Abs: 1.9 10*3/uL (ref 0.7–4.0)
Monocytes Absolute: 0.5 10*3/uL (ref 0.1–1.0)
Monocytes Relative: 7 % (ref 3–12)

## 2011-01-11 LAB — GLUCOSE, CAPILLARY: Glucose-Capillary: 106 mg/dL — ABNORMAL HIGH (ref 70–99)

## 2011-01-14 LAB — POCT URINALYSIS DIP (DEVICE)
Bilirubin Urine: NEGATIVE
Ketones, ur: NEGATIVE mg/dL
Protein, ur: NEGATIVE mg/dL
Specific Gravity, Urine: >=1.03 (ref 1.005–1.030)
pH: 5.5 (ref 5.0–8.0)

## 2011-01-16 ENCOUNTER — Ambulatory Visit (INDEPENDENT_AMBULATORY_CARE_PROVIDER_SITE_OTHER): Payer: Medicaid Other | Admitting: Family Medicine

## 2011-01-16 ENCOUNTER — Encounter: Payer: Self-pay | Admitting: Family Medicine

## 2011-01-16 VITALS — BP 141/87 | HR 71 | Temp 97.8°F | Wt 212.0 lb

## 2011-01-16 DIAGNOSIS — R030 Elevated blood-pressure reading, without diagnosis of hypertension: Secondary | ICD-10-CM

## 2011-01-16 DIAGNOSIS — F319 Bipolar disorder, unspecified: Secondary | ICD-10-CM

## 2011-01-16 DIAGNOSIS — K439 Ventral hernia without obstruction or gangrene: Secondary | ICD-10-CM

## 2011-01-16 DIAGNOSIS — M545 Low back pain: Secondary | ICD-10-CM

## 2011-01-16 DIAGNOSIS — F17200 Nicotine dependence, unspecified, uncomplicated: Secondary | ICD-10-CM

## 2011-01-16 LAB — CBC WITH DIFFERENTIAL/PLATELET
Basophils Absolute: 0 10*3/uL (ref 0.0–0.1)
Eosinophils Relative: 2 % (ref 0–5)
HCT: 51.2 % (ref 39.0–52.0)
Lymphocytes Relative: 33 % (ref 12–46)
Lymphs Abs: 2 10*3/uL (ref 0.7–4.0)
MCV: 93.8 fL (ref 78.0–100.0)
Neutro Abs: 3.4 10*3/uL (ref 1.7–7.7)
Platelets: 313 10*3/uL (ref 150–400)
RBC: 5.46 MIL/uL (ref 4.22–5.81)
RDW: 14 % (ref 11.5–15.5)
WBC: 6.1 10*3/uL (ref 4.0–10.5)

## 2011-01-16 LAB — COMPREHENSIVE METABOLIC PANEL
ALT: 33 U/L (ref 0–53)
CO2: 28 mEq/L (ref 19–32)
Calcium: 10 mg/dL (ref 8.4–10.5)
Chloride: 100 mEq/L (ref 96–112)
Creat: 1.08 mg/dL (ref 0.40–1.50)
Glucose, Bld: 108 mg/dL — ABNORMAL HIGH (ref 70–99)
Total Bilirubin: 0.7 mg/dL (ref 0.3–1.2)
Total Protein: 7.4 g/dL (ref 6.0–8.3)

## 2011-01-16 LAB — POCT GLYCOSYLATED HEMOGLOBIN (HGB A1C): Hemoglobin A1C: 6

## 2011-01-16 LAB — LIPID PANEL
Cholesterol: 185 mg/dL (ref 0–200)
HDL: 42 mg/dL (ref >39)
LDL Cholesterol: 124 mg/dL — ABNORMAL HIGH (ref 0–99)
Triglycerides: 95 mg/dL (ref <150)

## 2011-01-16 NOTE — Assessment & Plan Note (Signed)
Easily reducible, no red flags. Mr. Schue is somewhat anxious about this and requests an abdominal binder (lost previous script) (this was suggested by PA at [facility] ER) so that he will not think about it as much. Script for binder given. Handout given. Reviewed red flags.

## 2011-01-16 NOTE — Progress Notes (Signed)
  Subjective:    Patient ID: Andrew [name], male    DOB: 11/03/1965, 45 y.o.   MRN: [id number]  HPI  1) Bipolar d/o: On Zyprexa - reports some increased appetite and occasional diarrhea with this medication. Mood has been stable. Tobacco use helps stabilize mood as well. Denies depressive symptoms, manic symptoms, SI/HI. Meds prescribed by [facility] Labs requested CBC, metabolic panel, lipids, A1C. (fax [phone])  2) Elevated BP: No diagnosis of HTN. Checks his BP at home --> readings are 130-140 / 80-90. Denies chest pain, dyspnea, LE edema, drug use. No urinary changes or neurological changes noted.   3) Tobacco abuse: 1/2 pack per day. Pre-contemplative. Does not want to quit as it "helps him to be stable".  4) Low back pain: Chronic and intermittent. Somewhat improved with purchasing and using new mattress. Started without specific trigger or injury. Intermittent, worse with motion, better with rest, worse with lying flat, worse with palpation. Has tried ice or heating pad. Has been tried on meloxicam and flexeril with some benefit.  Lasts for up to 4 hours. Denies leg numbness or pain or tingling, saddle anethesia, leg weakness, incontinence. Pain is midline.   5) Prevention: Due for CMET, lipids, A1C for screening. PSA less than 1 year ago wnl. Reviewed PMH, family history and allergies and made appropriate changes.   Review of Systems As per HPI otherwise negative for balance of 12 systems.      Objective:   Physical Exam General:  alert, well-developed, and well-hydrated.   Mouth:  pharynx moist, no exudates, no lesions, and no erosions.   Neck:  supple, no lymphadenopathy   Lungs:  normal respiratory effort, normal breath sounds, no dullness, no crackles, and no wheezes.   Heart:  normal rate and regular rhythm.   Abdomen: Soft. Bowel sounds are normal. No distension. There is no tenderness. There is no rebound and no guarding.  2 cm by 2 cm abdominal wall hernia easily reducible  just superior to umbilicus, non tender  Pulses:  2+ radials and pedals  Extremities:  no edema  MSK: Mild pain with flexion, extension at lumbar spine, negative straight leg raise bilaterally, sensation intact bilateral lower extremities Neurologic:  alert & oriented X3 and cranial nerves II-XII intact.   Skin:  eczematous rash at mid abdomen at belt line  Psych:  Oriented X3, memory intact for recent and remote, not anxious appearing, and not depressed appearing.  No pressured speech or racing thoughts.        Assessment & Plan:

## 2011-01-16 NOTE — Assessment & Plan Note (Signed)
Managed by [facility]. Check A1C, CMET, CBC w/ diff, lipids today. Stable. Denies SI/HI. Follow up at [facility] as scheduled. Will fax lab results to number as above.

## 2011-01-16 NOTE — Assessment & Plan Note (Signed)
Somewhat improved. Likely muscle strain / spasm. Back exercises given. Continue meloxicam as needed. No red flag symptoms.

## 2011-01-16 NOTE — Assessment & Plan Note (Signed)
Continue to follow pressures. Advised regarding exercise, DASH diet. Follow up at next appointment.

## 2011-01-16 NOTE — Assessment & Plan Note (Signed)
Pre-contemplative. Follow up at next appointment.

## 2011-01-16 NOTE — Patient Instructions (Signed)
Great to see you today! I will send your labs over to the psychiatrist Do the back exercises that we discussed.  Wear the band as needed for support  Follow up in one year or sooner if needed

## 2011-01-24 ENCOUNTER — Encounter: Payer: Self-pay | Admitting: Family Medicine

## 2011-01-25 ENCOUNTER — Telehealth: Payer: Self-pay | Admitting: Family Medicine

## 2011-01-25 ENCOUNTER — Encounter: Payer: Self-pay | Admitting: Family Medicine

## 2011-01-25 NOTE — Telephone Encounter (Signed)
Wants to know results of his labs.

## 2011-01-25 NOTE — Telephone Encounter (Signed)
Unable to reach by phone. Letter sent. Labs also faxed to [facility].

## 2011-02-05 ENCOUNTER — Ambulatory Visit: Payer: Medicaid Other | Admitting: Family Medicine

## 2011-02-18 ENCOUNTER — Telehealth: Payer: Self-pay | Admitting: Family Medicine

## 2011-02-18 NOTE — Telephone Encounter (Signed)
Pt says he recently had lab work done, needs it faxed to mental health asap so they can prescribe his meds.

## 2011-02-18 NOTE — Telephone Encounter (Signed)
Labs faxed to [facility].

## 2011-02-19 NOTE — H&P (Signed)
Andrew [name], Andrew [name]                 ACCOUNT NO.:  [id number]   MEDICAL RECORD NO.:  [id number]          PATIENT TYPE:  IPS   LOCATION:  0301                          FACILITY:  [facility]   PHYSICIAN:  Jasmine Pang, M.D. DATE OF BIRTH:  [birth year]   DATE OF ADMISSION:  09/01/2008  DATE OF DISCHARGE:                       PSYCHIATRIC ADMISSION ASSESSMENT   TIME OF ASSESSMENT:  10:45 a.m.   IDENTIFICATION:  This is a 45 year old [place] American male, single.  This is a voluntary admission.   HISTORY OF PRESENT ILLNESS:  First [facility] admission for this 45 year old  who initially presented in the emergency room complaining of having a  lot of stress and with multiple physical complaints including frequent  nausea with vomiting.  Had complained that he fell yesterday and hit his  head and had also been vomiting some blood.  Today he is requesting help  with stress.  Says that he has been using cocaine about twice a week  for many years.  No clear period of abstinence.  Says he generally does  not abuse any other substances except the cocaine.  For the past 2 weeks  his cocaine has been heavier.  He is rather guarded about the reasons  why but does endorse that he has had a recent breakup with a girlfriend  and that was part of the stress.  Also had plans for this Thanksgiving  holiday that fell through but will not elaborate on details.  Says that  he has had thoughts on and off about hurting himself but will not  divulge a plan.  Today he has been cooperative here, complaining of  nausea that is keeping him from participating in activities, ate this  morning and vomited his breakfast.  Generally feeling kind of tired and  miserable.  Says that his thoughts are a little bit agitated and asking  for help calming down.  Denying any plans to hurt himself today.   PAST PSYCHIATRIC HISTORY:  First [facility] admission.  He denies a history of  any prior admissions.  Denies a history of treatment for  cocaine.  Has  admitted to having some auditory hallucinations on and off.  Denies any  prior treatment with psychotropics.  Has complained of hurting his head  in the past but this apparently involved a superficial scalp mass that  was removed.  Denies a history of seizures.   SOCIAL HISTORY:  Single African American male, has three grown children  that he is estranged from, generally does not keep in touch with them.  Currently working temporary jobs on and off as work is available.  Previously worked in Optician, dispensing and operated a Psychologist, sport and exercise.  Currently living in a rooming house and endorsing some social isolation  and recent breakup with girlfriend.  Denies any current legal problems.   FAMILY HISTORY:  He denies a family history of mental illness or  substance abuse.  No regular medical history.  No regular primary care.   MEDICAL PROBLEMS:  Are nausea and vomiting, hematemesis per history,  NOS.   PAST MEDICAL HISTORY:  Remarkable for possible  scalp surgery.   DRUG ALLERGIES:  Are PENICILLIN, gives him a rash.   PHYSICAL EXAMINATION:  VITAL SIGNS:  He is 6 feet 1 inch tall, 162  pounds, temperature 97, nine, pulse 91, respirations 20, blood pressure  119/80.  Full physical examination is done in the emergency room as  noted in the record.  He complained of a mild headache then which he  denies today.   DIAGNOSTIC STUDIES:  CBC - WBC 8.6, hemoglobin 14, hematocrit 42.1 and  platelets 302,000, MCV 92.3.  Chemistries all within normal limits.  Liver enzymes are currently pending.  They did check a heparin level on  him because of his complaints of bleeding and that was within normal  limits.  Alcohol level less than 5.  Routine urinalysis was remarkable  for 40 mg/dL of ketones.  Urine drug screen was positive for cocaine.  Chest x-ray revealed some minimal bronchitic changes with some biapical  bullous disease.  Exam of the scalp today reveals no signs of  contusion.  No abrasions or signs of injury.   CURRENT MEDICATIONS:  Are none.   MENTAL STATUS EXAM:  Fully alert male with quite guarded affect.  He is  in bed with the covers pulled up around his chin.  Eye contact is good.  Affect is fairly bright but says that he feels generally miserable  because he has been unable to eat or keep any food or fluid down for a  while.  Denies any abuse of alcohol.  Endorsing use of cocaine.  Speech  soft in tone and minimal.  He offers little by way of history.  Mood  mildly anxious.  Thought process is logical.  Gives coherent answers.  Gives a coherent history.  Asking for some help with his cocaine use.  Some feelings of paranoia that he gets, feeling guarded around people  and anxious, asking for a tranquilizer to help settle down.  He is  oriented to person, place and situation.  Willing to take medications  for his nausea and vomiting.   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  AXIS II:  Deferred.  AXIS III:  Nausea and vomiting, hematemesis by history.  AXIS IV:  Severe issues with relationship breakup and social isolation.  AXIS V:  Current is 45, past year not known.   PLAN:  Is to admit him to our dual diagnosis unit.  We are going to  start him on Seroquel 50 mg q.6 h p.r.n. for agitation and give him a  first dose now.  Will also start him on some Protonix routinely twice a  day and going to make available to him some Ensure and ginger ale.  Also  some Phenergan 25 mg IM or p.o. q.6 h p.r.n. for his nausea.  See if we  get his stomach settled down and will see if we can get him feeling  better, then possibly pursue some additional history regarding his  living situation and stressors.      Margaret A. Lorin Picket, N.P.      Jasmine Pang, M.D.  Electronically Signed    MAS/MEDQ  D:  09/01/2008  T:  09/01/2008  Job:  [id number]

## 2011-02-19 NOTE — Discharge Summary (Signed)
Andrew [name], Andrew [name]                 ACCOUNT NO.:  [id number]   MEDICAL RECORD NO.:  [id number]          PATIENT TYPE:  IPS   LOCATION:  0301                          FACILITY:  [facility]   PHYSICIAN:  Jasmine Pang, M.D. DATE OF BIRTH:  1966/07/12   DATE OF ADMISSION:  09/01/2008  DATE OF DISCHARGE:  09/07/2008                               DISCHARGE SUMMARY   IDENTIFICATION:  This is a 45 year old single African American male who  was admitted on a voluntary basis.   HISTORY OF PRESENT ILLNESS:  This is the first [facility] admission for this 54-  year-old, who initially presented in the emergency room complaining of  having a lot of stress and was also complaining of multiple physical  problems including frequent nausea and vomiting.  He complained that he  failed yesterday and hit his head and he has been vomiting some blood  today.  He is requesting some help with stress.  He says that he has  been using cocaine about twice a week from years.  It was no clear  period of abstinence.  He says he generally does not abuse any other  substances and cocaine.  For the past 2 weeks, his cocaine use has been  heavier.  He is rather guarded about reasons why, but does endorse that  he has had a recent breakup with a girlfriend and that was part of the  stress.  He also had plans for the Thanksgiving holiday that fell  through, but would not elaborate on the details.  He says that he had  thoughts on and off about hurting himself, but will not divulge a plan.  Today, he has been cooperative, complaining of nausea that is keeping  him from participating in activities.  He ate this morning and vomited  his breakfast.  He is generally feeling tired and miserable.  He says  that his thoughts were a little bit agitated and he is asking for help  calming down.  He is denying any plans to hurt himself today.   PAST PSYCHIATRIC HISTORY:  This is the first [facility] admission for the  patient.  He denies a history  of any other psychiatric hospitalization.  He denies a history of treatment for cocaine.  He is admitted to having  some auditory hallucinations in the past, on and off.  He denies any  prior treatment with psychotropics.  He is complained of hurting his  head in the past, but this apparently involved superficial scalp mass  that was removed.  He denies a history of seizures.   FAMILY HISTORY:  The patient denies any family history of mental illness  or substance abuse.   DRUG AND ALCOHOL HISTORY:  As per HPI.   MEDICAL PROBLEMS:  No regular medical history.  He was nauseated and  vomiting with some hematemesis by history.  He has also had possible  scalp surgery.   CURRENT MEDICATIONS:  None.   DRUG ALLERGIES:  PENICILLIN, which gives him a rash.   PHYSICAL EXAM:  There were no acute physical or medical problems noted.  His physical exam was done in the emergency room as noted in the record.  He did complain of mild headache, which he denies today.   DIAGNOSTIC STUDIES:  CBC reveals WBC of 8.6, hemoglobin of 14,  hematocrit 42.1, platelets of 302,000, MCV of 92.3.  Chemistries are all  within normal limits.  Liver enzymes are currently pending.  They did  check heparin level on him because of the complaints of bleeding and  that was within normal limits.  Alcohol level was less than 5.  Routine  urinalysis was remarkable for 40 mg/dL ketones.  Urine drug screen was  positive for cocaine.  Chest x-ray revealed some minimal bronchitic  changes with some by apical bullous disease.  Exam of the scalp today  reveals no signs of contusion.  No abrasions or signs of injury.   HOSPITAL COURSE:  Upon admission, the patient was started on trazodone  50 mg p.o. q.h.s. may repeat x1 p.r.n.  He was also started on Seroquel  50 mg p.o. q.6 h. p.r.n. agitation, first dose now and Protonix 40 mg  p.o. b.i.d. for GERD-like symptoms and Phenergan 25 mg p.o. q.6 h.  p.r.n. nausea.  In individual  sessions with me, the patient was  reserved.  He was lying in bed.  He stated his sleep had been poor.  His  appetite was poor.  Mood was depressed, anxious, and irritable.  There  was positive suicidal ideation.  He was also having auditory  hallucinations with voices telling me to hurt myself.  The patient was  started on Seroquel 50 mg p.o. q.h.s. for sleep and mood stabilization.  His sleep continued to be poor and especially with difficulty falling  asleep.  The trazodone was increased to 100 mg p.o. q.h.s. with may  repeat x1 p.r.n.  He has had poor dentition and was having difficulty  chewing his food and ordered some Ensure supplements t.i.d.  He also had  what appeared to be athletes foot and dry skin to his both feet.  I  prescribed Lotrimin antifungal cream apply to the feet b.i.d.  On  September 05, 2008, he stated that he was still depressed and anxious.  He was suicidal, but contract for safety.  He also thought he had seen  someone in his room the night before.  On September 06, 2008, sleep was  good.  Appetite was good.  He was less depressed, less anxious.  He does  find himself worrying about where he was going to work and live.  There  was no suicidal ideation.  No auditory or visual hallucinations.  On  September 07, 2008, sleep continued to be improved with the increased  trazodone.  Appetite was good, though he still had trouble chewing his  food due to his poor dentition.  Mood was less depressed, less anxious.  Affect was consistent with mood.  There was no suicidal or homicidal  ideation.  No thoughts of self-injurious behavior.  No auditory or  visual hallucinations.  No paranoia or delusions.  Thoughts were logical  and goal-directed.  Thought content no predominant theme.  Cognitive was  grossly intact.  Insight fair.  Judgment fair.  Impulse control was  fair.  It was felt the patient was safe to go home today and was ready  for discharge.   DISCHARGE DIAGNOSES:   Axis I:  Mood disorder, not otherwise specified,  with psychosis, cocaine dependence.  Axis II:  None.  Axis III:  Healthy.  Axis IV:  Severe (issues with relationship breakdown and social  isolation and burden of psychiatric illness).  Axis V:  Global assessment of functioning was 55 upon discharge.  GAF  was 46 upon admission.  GAF highest past year 60 to 10.   DISCHARGE PLANS:  There was no specific activity level or dietary  restrictions.   POSTHOSPITAL CARE PLANS:  The patient will go to vocational rehab for  help with getting his job.  He was given this phone number.  He will go  to [facility] on December 7th at 1:30 p.m. for a followup  medication management.   DISCHARGE MEDICATIONS:  1. Seroquel 50 mg at bedtime.  2. Pepcid 20 mg twice a day for 1 week then once a day.  3. Trazodone 100 mg at bedtime if needed for insomnia.  4. Lotrimin Cream apply to feet twice a day for 2 weeks.   The patient was also referred to [facility] for further medical  problems.      Jasmine Pang, M.D.  Electronically Signed     BHS/MEDQ  D:  09/07/2008  T:  09/08/2008  Job:  [id number]

## 2011-03-20 ENCOUNTER — Emergency Department (HOSPITAL_BASED_OUTPATIENT_CLINIC_OR_DEPARTMENT_OTHER)
Admission: EM | Admit: 2011-03-20 | Discharge: 2011-03-21 | Disposition: A | Discharge status: Home / Self Care | Payer: Medicare Other | Attending: Emergency Medicine | Admitting: Emergency Medicine

## 2011-03-20 ENCOUNTER — Emergency Department (INDEPENDENT_AMBULATORY_CARE_PROVIDER_SITE_OTHER): Payer: Medicare Other

## 2011-03-20 DIAGNOSIS — F17200 Nicotine dependence, unspecified, uncomplicated: Secondary | ICD-10-CM | POA: Insufficient documentation

## 2011-03-20 DIAGNOSIS — R221 Localized swelling, mass and lump, neck: Secondary | ICD-10-CM

## 2011-03-20 DIAGNOSIS — R220 Localized swelling, mass and lump, head: Secondary | ICD-10-CM

## 2011-03-20 DIAGNOSIS — I2510 Atherosclerotic heart disease of native coronary artery without angina pectoris: Secondary | ICD-10-CM | POA: Insufficient documentation

## 2011-03-20 DIAGNOSIS — J029 Acute pharyngitis, unspecified: Secondary | ICD-10-CM

## 2011-03-22 ENCOUNTER — Ambulatory Visit (INDEPENDENT_AMBULATORY_CARE_PROVIDER_SITE_OTHER): Payer: Medicare Other | Admitting: Family Medicine

## 2011-03-22 ENCOUNTER — Encounter: Payer: Self-pay | Admitting: Family Medicine

## 2011-03-22 DIAGNOSIS — L905 Scar conditions and fibrosis of skin: Secondary | ICD-10-CM

## 2011-03-22 NOTE — Progress Notes (Signed)
  Subjective:    Patient ID: Andrew [name], male    DOB: 1965-11-30, 45 y.o.   MRN: [id number]  HPI  1) Follow up lipoma: Patient status post removal of occipital lipoma with Dr. Davina Poke at [facility] (performed in 2010). He feels like lipoma is returning and has been having some soreness at the site of excision. He has tried Tylenol and Ibuprofen sparingly with some relief of pain. Denies dizziness, fever, chills, nausea, emesis, photophobia / phonophobia, swelling at site of lipoma excision, drainage at site of lipoma excision   Pertinent past history reviewed   Review of Systems As per HPI    Objective:   Physical Exam General: well appearing, NAD  Head: well healed scars occipitally at site of previous lipoma excisions without erythema, edema orr fluctuance. Mildly tender (at most) to palpation at site of scars.        Assessment & Plan:

## 2011-03-22 NOTE — Patient Instructions (Signed)
Give Dr. Rosezena Sensor office a call to set up an appointment to follow up your lipoma. Take ibuprofen for pain. This is an anti-inflammatory and will work best for you.  Stop working if you want to build up your sperm count - this will work better than anything else for this.

## 2011-03-23 DIAGNOSIS — L905 Scar conditions and fibrosis of skin: Secondary | ICD-10-CM | POA: Insufficient documentation

## 2011-03-23 NOTE — Assessment & Plan Note (Addendum)
At site of lipoma excision. Andrew [name] often has pain out of proportion to exam / physical findings - I do not believe his reported pain to be clinically significant at this time. Advised that patient could follow up with Dr. Davina Poke if needed. He understood this and will call to make follow-up appointment if needed. Would consider topical corticosteroid for irritation if continues.

## 2011-04-01 ENCOUNTER — Telehealth: Payer: Self-pay | Admitting: Family Medicine

## 2011-04-01 DIAGNOSIS — D170 Benign lipomatous neoplasm of skin and subcutaneous tissue of head, face and neck: Secondary | ICD-10-CM

## 2011-04-01 NOTE — Telephone Encounter (Signed)
Pt asking for referral to Dr. Luisa Hart at [facility], says he spoke with Dr. Wallene Huh about this at his last visit, would like to speak with [facility] before he leaves.

## 2011-04-23 ENCOUNTER — Ambulatory Visit (INDEPENDENT_AMBULATORY_CARE_PROVIDER_SITE_OTHER): Payer: Self-pay | Admitting: Surgery

## 2011-04-23 NOTE — Telephone Encounter (Signed)
Referral placed & pts has upcoming appt, closing encounter.

## 2011-05-16 ENCOUNTER — Ambulatory Visit (INDEPENDENT_AMBULATORY_CARE_PROVIDER_SITE_OTHER): Payer: Medicare Other | Admitting: Surgery

## 2011-05-16 ENCOUNTER — Encounter (INDEPENDENT_AMBULATORY_CARE_PROVIDER_SITE_OTHER): Payer: Self-pay | Admitting: Surgery

## 2011-05-16 VITALS — BP 124/88 | HR 64 | Temp 96.4°F | Ht 68.5 in | Wt 217.4 lb

## 2011-05-16 DIAGNOSIS — R51 Headache: Secondary | ICD-10-CM

## 2011-05-16 MED ORDER — HYDROCODONE-ACETAMINOPHEN 10-325 MG PO TABS: 1.0000 | ORAL_TABLET | Freq: Four times a day (QID) | ORAL | Status: DC | PRN

## 2011-05-16 NOTE — Patient Instructions (Signed)
Follow up with your new doctor.Will obtain a CT scan of your head to evaluate the pain.  Follow up as needed.

## 2011-05-16 NOTE — Progress Notes (Signed)
Andrew [name] is a 45 y.o. male.    Chief Complaint  Patient presents with  . Other    new pt- eval lipomA    HPI HPIThe patient presents today with a 2 month history of pain over his posterior head. He had a lipoma removed from his posterior scalp back in 2010. Over the last 2-3 months he is having increasing pain in his old scar site over his posterior scalp. The pain is relatively constant and dull in nature. Nothing seems to make it better or worse. He is very tender when he pushes on the scar. He thinks he feels a new lipoma there. There is been no redness, drainage or signs of infection. The pain is probably a 5-7/10. Over the counter pain medicine doesn't seem to help the scalp pain.   Past Medical History  Diagnosis Date  . Bipolar disorder   . Eczema   . Tobacco abuse   . Low back pain   . Elevated blood pressure reading without diagnosis of hypertension   . History of cocaine abuse   . Abdominal wall hernia   . Poor circulation   . Generalized headaches   . Arthritis     Past Surgical History  Procedure Date  . Lipoma excision     Family History  Problem Relation Age of Onset  . Hypertension Mother   . Cancer Mother   . Heart disease Father   . Heart attack Father 35  . Hyperlipidemia Father   . Hypertension Father   . Diabetes Sister   . Diabetes Brother     Social History History  Substance Use Topics  . Smoking status: Current Some Day Smoker -- 0.5 packs/day    Types: Cigarettes  . Smokeless tobacco: Not on file  . Alcohol Use: 4.2 oz/week    7 Glasses of wine per week    Allergies  Allergen Reactions  . Penicillins     Current Outpatient Prescriptions  Medication Sig Dispense Refill  . FLUoxetine (PROZAC) 20 MG tablet Take 20 mg by mouth daily.        Marland Kitchen OLANZapine (ZYPREXA) 5 MG tablet Take 5 mg by mouth at bedtime.        Marland Kitchen HYDROcodone-acetaminophen (NORCO) 10-325 MG per tablet Take 1 tablet by mouth every 6 (six) hours as needed for  pain.  20 tablet  0    Review of Systems Review of Systems  Constitutional: Negative.   HENT: Negative for hearing loss and tinnitus.   Eyes: Negative.   Respiratory: Negative.   Cardiovascular: Negative.   Gastrointestinal: Negative.   Genitourinary: Negative.   Musculoskeletal: Negative.   Skin: Negative.   Neurological: Positive for headaches. Negative for dizziness, tingling, tremors and focal weakness.  Endo/Heme/Allergies: Negative.   Psychiatric/Behavioral: Negative.     Physical Exam Physical Exam  Constitutional: He is oriented to person, place, and time. He appears well-developed and well-nourished.  HENT:  Head: Normocephalic.  Nose: Nose normal.       Scar over her posterior scalp well-healed. Extremely tender. No mass noted. No evidence of discharge or drainage.  Eyes: Conjunctivae are normal. Pupils are equal, round, and reactive to light.  Neck: Normal range of motion. Neck supple.  Musculoskeletal: Normal range of motion.  Neurological: He is alert and oriented to person, place, and time. He has normal reflexes. No cranial nerve deficit. Coordination normal.  Skin: Skin is warm and dry.  Psychiatric: He has a normal mood and affect. His  behavior is normal. Judgment and thought content normal.     Blood pressure 124/88, pulse 64, temperature 96.4 F (35.8 C), temperature source Temporal, height 5' 8.5" (1.74 m), weight 217 lb 6.4 oz (98.612 kg).  Assessment/Plan Pain over her posterior scalp near previous lipoma excision site x2 months.  The etiology of his pain is unclear to me. Certainly this could be underlying intracranial problem. I will send him for a CT scan of his head to further evaluate his pain since I see no obvious cause on physical examination today. Certainly this scar and cause discomfort for over 2 years out from surgery and is unusual for him to start bothering him as far out now. I recommended he contact his primary care doctor for visit and  we'll see him back unless something shows up on his workup.  Andrew [name] A. 05/16/2011, 10:44 AM

## 2011-05-17 ENCOUNTER — Ambulatory Visit
Admission: RE | Admit: 2011-05-17 | Discharge: 2011-05-17 | Disposition: A | Discharge status: Home / Self Care | Payer: Medicare Other | Source: Ambulatory Visit | Attending: Surgery | Admitting: Surgery

## 2011-05-17 MED ORDER — IOHEXOL 300 MG/ML  SOLN: 75.0000 mL | Freq: Once | INTRAMUSCULAR | Status: AC | PRN

## 2011-05-27 ENCOUNTER — Encounter: Payer: Medicare Other | Admitting: Family Medicine

## 2011-06-07 ENCOUNTER — Encounter: Payer: Medicare Other | Admitting: Family Medicine

## 2011-06-20 ENCOUNTER — Encounter: Payer: Self-pay | Admitting: Family Medicine

## 2011-06-20 ENCOUNTER — Ambulatory Visit (INDEPENDENT_AMBULATORY_CARE_PROVIDER_SITE_OTHER): Payer: Medicare Other | Admitting: Family Medicine

## 2011-06-20 VITALS — BP 116/73 | HR 62 | Temp 98.4°F | Ht 68.0 in | Wt 208.0 lb

## 2011-06-20 DIAGNOSIS — R519 Headache, unspecified: Secondary | ICD-10-CM | POA: Insufficient documentation

## 2011-06-20 DIAGNOSIS — R51 Headache: Secondary | ICD-10-CM

## 2011-06-20 MED ORDER — SUMATRIPTAN SUCCINATE 6 MG/0.5ML ~~LOC~~ SOLN: 6.0000 mg | Freq: Once | SUBCUTANEOUS | Status: DC

## 2011-06-20 MED ORDER — KETOROLAC TROMETHAMINE 30 MG/ML IJ SOLN
30.0000 mg | Freq: Once | INTRAMUSCULAR | Status: AC
  Administered 2011-06-20: 30 mg via INTRAMUSCULAR

## 2011-06-20 NOTE — Progress Notes (Signed)
Addended by: Adalberto Cole on: 06/20/2011 11:59 AM   Modules accepted: Orders

## 2011-06-20 NOTE — Assessment & Plan Note (Signed)
Patient does have some sign of having a little trauma to the left temporal region. Patient though seems to be doing well and going on his activities of daily living without any trouble. Patient states she is having some visual problems but when tested does not seem to be very impeded. Patient has full range of motion of the neck area but does have some mild cervical spasm on the left side. We will treat this as a possible migraine secondary to the trauma we will give him a shot of the trochanter as well as portal at this time. Monitor for 10 minutes while patient is an office have patient take ibuprofen at home as needed. Patient given handout about headaches and other home therapy secondly done. We'll have him followup only as needed if not better by the weekend the

## 2011-06-20 NOTE — Patient Instructions (Signed)
Headache, General, Without Cause The specific cause of your headache may not have been found today. There are many causes and types of headache. A few common ones are:  Tension headache.  Migraine.   Infections (examples: dental and sinus infections).  Bone and/or joint problems in the neck or jaw.   Depression.  Eye problems.   These headaches are not life threatening.  Headaches can sometimes be diagnosed by a patient history and a physical exam. Sometimes, lab and imaging studies (such as x-ray and/or CT scan) are used to rule out more serious problems. In some cases, a spinal tap (lumbar puncture) may be requested. There are many times when your exam and tests may be normal on the first visit even when there is a serious problem causing your headaches. Because of that, it is very important to follow up with your doctor or local clinic for further evaluation. HOME CARE INSTRUCTIONS  Keep follow-up appointments with your caregiver, or any specialist referral.   Only take over-the-counter or prescription medicines for pain, discomfort, or fever as directed by your caregiver.   Biofeedback, massage, or other relaxation techniques may be helpful.   Ice packs or heat applied to the head and neck can be used. Do this three to four times per day, or as needed.   Call your doctor if you have any questions or concerns.   If you smoke, you should quit.  SEEK MEDICAL CARE IF:  You develop problems with medications prescribed.   You do not respond to or obtain relief from medications.   You have a change from the usual headache.   You develop nausea or vomiting.  SEEK IMMEDIATE MEDICAL CARE IF:   If your headache becomes severe.   You have an unexplained oral temperature above 100.4, or as your caregiver suggests.   You have a stiff neck.   You have loss of vision.   You have muscular weakness.   You have loss of muscular control.   You develop severe symptoms different from  your first symptoms.   You start losing your balance or have trouble walking.   You feel faint or pass out.  MAKE SURE YOU:   Understand these instructions.   Will watch your condition.   Will get help right away if you are not doing well or get worse.  Document Released: 09/23/2005 Document Re-Released: 09/05/2008 [facility] Patient Information 2011 [place], [place].

## 2011-06-20 NOTE — Progress Notes (Signed)
  Subjective:    Patient ID: Andrew [name], male    DOB: [birth year], 45 y.o.   MRN: [id number]  HPI 45 year old [facility] was trying to walk to the door and the sliding door hit him on the left side of his temple. Patient states that it cost him to swing his head with a lot of force. Did not pass out did not have any loss of consciousness did not fall down. Patient states though he had a headache immediately and then starting on Tuesday, initial incident occurred Monday, he started having some little trouble with vision changes. Patient does know her glasses and never has worn glasses. Patient's vision is showing about approximately 20/40 uncorrected. Patient states since this has been occurring 2 he is seen more floaters in his eyes than usual and states he's had some mild photophobia. Patient denies any type of nausea vomiting patient though does state he has a headache on the left side that seems to be constant and throbbing. The patient has no history of headaches prior to this. Patient denies any type of numbness or tingling in any extremities and no trouble with word finding able to swallow without difficulty.   Review of Systems As above related to the history of present illness. Past medical history, social, surgical and family history all reviewed.      Objective:   Physical Exam Gen: NAD, alert HEENT: Extraocular movements intact, pupils are equal round reactive to light and accommodation, patient's head is normocephalic atraumatic in nature patient though is tender over the super orbital ridge. Patient's TMs are intact and nonbulging non-erythemic bilaterally. Patient's throat has no erythema no postnasal drip uvula is midline. Neuro, cranial nerves II through XII are intact, patient neurovascularly intact in all extremities and face.    Assessment & Plan:

## 2011-07-04 ENCOUNTER — Encounter: Payer: Medicare Other | Admitting: Family Medicine

## 2011-07-09 LAB — URINALYSIS, ROUTINE W REFLEX MICROSCOPIC
Hgb urine dipstick: NEGATIVE
Nitrite: NEGATIVE
Protein, ur: NEGATIVE
Specific Gravity, Urine: 1.029
Urobilinogen, UA: 1

## 2011-07-09 LAB — DIFFERENTIAL
Basophils Absolute: 0
Lymphocytes Relative: 19
Lymphs Abs: 1.6
Neutro Abs: 6.2
Neutrophils Relative %: 71

## 2011-07-09 LAB — POCT CARDIAC MARKERS
CKMB, poc: 1.7
CKMB, poc: 2.2
Myoglobin, poc: 190
Myoglobin, poc: 278
Troponin i, poc: <0.05
Troponin i, poc: <0.05

## 2011-07-09 LAB — POCT I-STAT, CHEM 8
BUN: 21
Calcium, Ion: 1.21
Chloride: 100
Glucose, Bld: 129 — ABNORMAL HIGH
HCT: 44
Potassium: 3.6

## 2011-07-09 LAB — CBC
HCT: 42.1
MCHC: 33.2
MCV: 92.3
Platelets: 302
RDW: 13.1
WBC: 8.6

## 2011-07-09 LAB — HEPATIC FUNCTION PANEL
ALT: 18 U/L (ref 0–53)
ALT: 16
AST: 29 U/L (ref 0–37)
AST: 26
Albumin: 3.1 g/dL — ABNORMAL LOW (ref 3.5–5.2)
Alkaline Phosphatase: 66 U/L (ref 39–117)
Bilirubin, Direct: 0.1 mg/dL (ref 0.0–0.3)
Total Bilirubin: 0.7 mg/dL (ref 0.3–1.2)
Total Protein: 7.1

## 2011-07-09 LAB — HEPARIN LEVEL (UNFRACTIONATED): Heparin Unfractionated: <0.1 — ABNORMAL LOW

## 2011-07-09 LAB — RAPID URINE DRUG SCREEN, HOSP PERFORMED
Amphetamines: NOT DETECTED
Opiates: NOT DETECTED
Tetrahydrocannabinol: NOT DETECTED

## 2011-07-09 LAB — LIPASE, BLOOD: Lipase: 21

## 2011-07-12 ENCOUNTER — Encounter: Payer: Medicare Other | Admitting: Family Medicine

## 2011-07-23 ENCOUNTER — Encounter: Payer: Medicare Other | Admitting: Family Medicine

## 2011-10-19 ENCOUNTER — Other Ambulatory Visit: Payer: Self-pay

## 2011-10-19 ENCOUNTER — Encounter (HOSPITAL_COMMUNITY): Payer: Self-pay | Admitting: *Deleted

## 2011-10-19 ENCOUNTER — Emergency Department (HOSPITAL_COMMUNITY)
Admission: EM | Admit: 2011-10-19 | Discharge: 2011-10-19 | Disposition: A | Discharge status: Home / Self Care | Payer: Medicare Other | Attending: Emergency Medicine | Admitting: Emergency Medicine

## 2011-10-19 ENCOUNTER — Emergency Department (HOSPITAL_COMMUNITY): Payer: Medicare Other

## 2011-10-19 DIAGNOSIS — M129 Arthropathy, unspecified: Secondary | ICD-10-CM | POA: Diagnosis not present

## 2011-10-19 DIAGNOSIS — R5381 Other malaise: Secondary | ICD-10-CM | POA: Insufficient documentation

## 2011-10-19 DIAGNOSIS — J3489 Other specified disorders of nose and nasal sinuses: Secondary | ICD-10-CM | POA: Insufficient documentation

## 2011-10-19 DIAGNOSIS — IMO0001 Reserved for inherently not codable concepts without codable children: Secondary | ICD-10-CM | POA: Insufficient documentation

## 2011-10-19 DIAGNOSIS — J189 Pneumonia, unspecified organism: Secondary | ICD-10-CM | POA: Insufficient documentation

## 2011-10-19 DIAGNOSIS — R059 Cough, unspecified: Secondary | ICD-10-CM | POA: Diagnosis not present

## 2011-10-19 DIAGNOSIS — I4460 Unspecified fascicular block: Secondary | ICD-10-CM | POA: Diagnosis not present

## 2011-10-19 DIAGNOSIS — F319 Bipolar disorder, unspecified: Secondary | ICD-10-CM | POA: Diagnosis not present

## 2011-10-19 DIAGNOSIS — R062 Wheezing: Secondary | ICD-10-CM | POA: Insufficient documentation

## 2011-10-19 DIAGNOSIS — R918 Other nonspecific abnormal finding of lung field: Secondary | ICD-10-CM | POA: Diagnosis not present

## 2011-10-19 DIAGNOSIS — R0989 Other specified symptoms and signs involving the circulatory and respiratory systems: Secondary | ICD-10-CM | POA: Diagnosis not present

## 2011-10-19 DIAGNOSIS — R6889 Other general symptoms and signs: Secondary | ICD-10-CM | POA: Diagnosis not present

## 2011-10-19 DIAGNOSIS — R0602 Shortness of breath: Secondary | ICD-10-CM | POA: Diagnosis not present

## 2011-10-19 DIAGNOSIS — F17200 Nicotine dependence, unspecified, uncomplicated: Secondary | ICD-10-CM | POA: Insufficient documentation

## 2011-10-19 DIAGNOSIS — Z79899 Other long term (current) drug therapy: Secondary | ICD-10-CM | POA: Insufficient documentation

## 2011-10-19 DIAGNOSIS — R05 Cough: Secondary | ICD-10-CM | POA: Insufficient documentation

## 2011-10-19 LAB — POCT I-STAT, CHEM 8
Calcium, Ion: 1.22 mmol/L (ref 1.12–1.32)
Glucose, Bld: 100 mg/dL — ABNORMAL HIGH (ref 70–99)
HCT: 48 % (ref 39.0–52.0)
Hemoglobin: 16.3 g/dL (ref 13.0–17.0)
TCO2: 30 mmol/L (ref 0–100)

## 2011-10-19 MED ORDER — ALBUTEROL SULFATE HFA 108 (90 BASE) MCG/ACT IN AERS: 2.0000 | INHALATION_SPRAY | RESPIRATORY_TRACT | Status: DC | PRN

## 2011-10-19 MED ORDER — KETOROLAC TROMETHAMINE 30 MG/ML IJ SOLN
30.0000 mg | Freq: Once | INTRAMUSCULAR | Status: AC
  Administered 2011-10-19: 30 mg via INTRAMUSCULAR
  Filled 2011-10-19: qty 1

## 2011-10-19 MED ORDER — ALBUTEROL SULFATE (5 MG/ML) 0.5% IN NEBU
5.0000 mg | INHALATION_SOLUTION | Freq: Once | RESPIRATORY_TRACT | Status: AC
  Administered 2011-10-19: 5 mg via RESPIRATORY_TRACT
  Filled 2011-10-19: qty 1

## 2011-10-19 MED ORDER — DEXTROSE 5 % IV SOLN
1.0000 g | Freq: Once | INTRAVENOUS | Status: AC
  Administered 2011-10-19: 17:00:00 via INTRAVENOUS
  Filled 2011-10-19: qty 10

## 2011-10-19 MED ORDER — HYDROCOD POLST-CHLORPHEN POLST 10-8 MG/5ML PO LQCR: 5.0000 mL | Freq: Every evening | ORAL | Status: DC | PRN

## 2011-10-19 MED ORDER — MOXIFLOXACIN HCL 400 MG PO TABS: 400.0000 mg | ORAL_TABLET | Freq: Every day | ORAL | Status: AC

## 2011-10-19 NOTE — ED Notes (Signed)
EKG was done and given to Dr. Patrica Duel. New and Old

## 2011-10-19 NOTE — ED Notes (Signed)
Patient c/o body aches, weakness, chest congestion, cough for about two weeks.

## 2011-10-19 NOTE — ED Provider Notes (Signed)
Date: 10/19/2011  Rate: 82  Rhythm: normal sinus rhythm  QRS Axis: left  Intervals: normal  ST/T Wave abnormalities: normal  Conduction Disutrbances:left anterior fascicular block  Narrative Interpretation:   Old EKG Reviewed: unchanged    Patient seen and examined following first nebulizer treatment.  Discussed results with patient.  Patient reports he feels much better after treatment.  On exam, pt is A&Ox4, NAD, RRR, lungs with coarse breath sounds without wheezing, moving air well in all fields.   8:17 PM Patient is sleeping soundly on monitor.    10:14 PM Patient now 98% on room air.  Feeling better after neb treatments.  Lungs are CTAB, moving air well in all fields.  Plan is for d/c home.  Avelox chosen because of patient's unclear medical history per Dr Patrica Duel.  Dillard Cannon Gilliam, [place] 10/19/11 2236

## 2011-10-19 NOTE — ED Provider Notes (Addendum)
History     CSN: [id number]  Arrival date & time 10/19/11  1450   First MD Initiated Contact with Patient 10/19/11 1610      Chief Complaint  Patient presents with  . Nasal Congestion  . chest congestion     (Consider location/radiation/quality/duration/timing/severity/associated sxs/prior treatment) HPI  Past Medical History  Diagnosis Date  . Bipolar disorder   . Eczema   . Tobacco abuse   . Low back pain   . Elevated blood pressure reading without diagnosis of hypertension   . History of cocaine abuse   . Abdominal wall hernia   . Poor circulation   . Generalized headaches   . Arthritis    Patient with fairly extensive past medical history documented above. Reports nasal congestion, cough, body aches, weakness, for a period of 2 weeks. He states today he "passed out" in the parking lot. He's had no documented fevers, no dysuria. He has pain in his chest especially with coughing. He's had no rash. No neck pain. Patient did not want to tell me his past medical history,.we'll review the chart. He is a smoker. Also, apparently drinks. Past Surgical History  Procedure Date  . Lipoma excision     Family History  Problem Relation Age of Onset  . Hypertension Mother   . Cancer Mother   . Heart disease Father   . Heart attack Father 54  . Hyperlipidemia Father   . Hypertension Father   . Diabetes Sister   . Diabetes Brother     History  Substance Use Topics  . Smoking status: Current Some Day Smoker -- 1.0 packs/day    Types: Cigarettes  . Smokeless tobacco: Not on file  . Alcohol Use: 4.2 oz/week    7 Glasses of wine per week      Review of Systems  All other systems reviewed and are negative.    Allergies  Penicillins  Home Medications   Current Outpatient Rx  Name Route Sig Dispense Refill  . OLANZAPINE 5 MG PO TABS Oral Take 5 mg by mouth at bedtime.        BP 121/80  Pulse 65  Temp(Src) 98.3 F (36.8 C) (Oral)  Resp 20  SpO2  95%  Physical Exam  Nursing note and vitals reviewed. Constitutional: He is oriented to person, place, and time. He appears well-developed and well-nourished.  HENT:  Head: Normocephalic and atraumatic.  Eyes: Conjunctivae and EOM are normal. Pupils are equal, round, and reactive to light.       Oral mucous membranes moist no exudate or edema to the throat. Left TM is slightly red, right TM is clear  Neck: Neck supple.  Cardiovascular: Normal rate and regular rhythm.  Exam reveals no gallop and no friction rub.   No murmur heard. Pulmonary/Chest: He has wheezes. He has no rales. He exhibits no tenderness.       Congested cough, scattered wheezing and rhonchi. No respiratory distress  Abdominal: Soft. Bowel sounds are normal. He exhibits no distension. There is no tenderness. There is no rebound and no guarding.  Musculoskeletal: Normal range of motion.  Neurological: He is alert and oriented to person, place, and time. No cranial nerve deficit. Coordination normal.  Skin: Skin is warm and dry. No rash noted.  Psychiatric: He has a normal mood and affect.    ED Course  Procedures (including critical care time)   Labs Reviewed  I-STAT, CHEM 8   No results found.   No diagnosis found.  MDM  Pt is seen and examined;  Initial history and physical completed.  Will follow.        Patient will be moved to the CDU for further care and evaluation and respiratory isolation.  Alhaji Mcneal A. Patrica Duel, MD 10/19/11 1658  Bryam Taborda A. Patrica Duel, MD 10/19/11 1659

## 2011-10-19 NOTE — ED Notes (Signed)
Pt resp e/u, AOx4, NAD, ambulatory with steady gait. Pt states understanding of discharge instructions and denies questions at time of discharge.

## 2011-10-20 NOTE — ED Notes (Signed)
Call from [facility] regarding RX Avelox too expensive. RX changed to Doxycycline 100 mg BID x seven days by Fayrene Helper PA.

## 2011-10-21 NOTE — ED Provider Notes (Signed)
Medical screening examination/treatment/procedure(s) were conducted as a shared visit with non-physician practitioner(s) and myself.  I personally evaluated the patient during the encounter   Damyah Gugel A. Patrica Duel, MD 10/21/11 2348

## 2011-12-18 ENCOUNTER — Encounter (HOSPITAL_COMMUNITY): Payer: Self-pay | Admitting: *Deleted

## 2011-12-18 ENCOUNTER — Emergency Department (HOSPITAL_COMMUNITY)

## 2011-12-18 ENCOUNTER — Emergency Department (HOSPITAL_COMMUNITY)
Admission: EM | Admit: 2011-12-18 | Discharge: 2011-12-18 | Disposition: A | Discharge status: Home / Self Care | Attending: Emergency Medicine | Admitting: Emergency Medicine

## 2011-12-18 DIAGNOSIS — Z79899 Other long term (current) drug therapy: Secondary | ICD-10-CM | POA: Insufficient documentation

## 2011-12-18 DIAGNOSIS — K429 Umbilical hernia without obstruction or gangrene: Secondary | ICD-10-CM | POA: Insufficient documentation

## 2011-12-18 DIAGNOSIS — R112 Nausea with vomiting, unspecified: Secondary | ICD-10-CM | POA: Insufficient documentation

## 2011-12-18 DIAGNOSIS — R197 Diarrhea, unspecified: Secondary | ICD-10-CM | POA: Insufficient documentation

## 2011-12-18 DIAGNOSIS — F319 Bipolar disorder, unspecified: Secondary | ICD-10-CM | POA: Insufficient documentation

## 2011-12-18 DIAGNOSIS — M129 Arthropathy, unspecified: Secondary | ICD-10-CM | POA: Insufficient documentation

## 2011-12-18 DIAGNOSIS — R1033 Periumbilical pain: Secondary | ICD-10-CM | POA: Insufficient documentation

## 2011-12-18 LAB — BASIC METABOLIC PANEL
Chloride: 101 mEq/L (ref 96–112)
Creatinine, Ser: 0.83 mg/dL (ref 0.50–1.35)
GFR calc Af Amer: >90 mL/min (ref >90)
Sodium: 136 mEq/L (ref 135–145)

## 2011-12-18 LAB — DIFFERENTIAL
Basophils Absolute: 0 10*3/uL (ref 0.0–0.1)
Basophils Relative: 0 % (ref 0–1)
Lymphocytes Relative: 40 % (ref 12–46)
Monocytes Absolute: 0.6 10*3/uL (ref 0.1–1.0)
Neutro Abs: 2.9 10*3/uL (ref 1.7–7.7)
Neutrophils Relative %: 47 % (ref 43–77)

## 2011-12-18 LAB — URINALYSIS, ROUTINE W REFLEX MICROSCOPIC
Ketones, ur: NEGATIVE mg/dL
Leukocytes, UA: NEGATIVE
Nitrite: NEGATIVE
Protein, ur: NEGATIVE mg/dL
pH: 6 (ref 5.0–8.0)

## 2011-12-18 LAB — CBC
HCT: 42.1 % (ref 39.0–52.0)
MCHC: 34.4 g/dL (ref 30.0–36.0)
Platelets: 294 10*3/uL (ref 150–400)
RDW: 12.9 % (ref 11.5–15.5)
WBC: 6.1 10*3/uL (ref 4.0–10.5)

## 2011-12-18 MED ORDER — IBUPROFEN 200 MG PO TABS: 400.0000 mg | ORAL_TABLET | Freq: Four times a day (QID) | ORAL | Status: AC | PRN

## 2011-12-18 MED ORDER — IOHEXOL 300 MG/ML  SOLN
100.0000 mL | Freq: Once | INTRAMUSCULAR | Status: AC | PRN
  Administered 2011-12-18: 100 mL via INTRAVENOUS
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
## 2011-12-18 NOTE — ED Notes (Signed)
Umbilical hernia pain for 2 weeks, worse today.,  Vomited x2 since yesterday, diarrhea x 2 days.    No fever or chills

## 2011-12-18 NOTE — ED Provider Notes (Signed)
History   This chart was scribed for Flint Melter, MD by Davonna Belling. The patient was seen in room APA04/APA04. Patient's care was started at 1330.   CSN: [id number]  Arrival date & time 12/18/11  1330   First MD Initiated Contact with Patient 12/18/11 1431      Chief Complaint  Patient presents with  . Abdominal Pain    (Consider location/radiation/quality/duration/timing/severity/associated sxs/prior treatment) Patient is a 46 y.o. male presenting with abdominal pain.  Abdominal Pain The primary symptoms of the illness include abdominal pain, nausea, vomiting and diarrhea. The primary symptoms of the illness do not include fever.  Symptoms associated with the illness do not include constipation.   Andrew [name] is a 46 y.o. male who presents to the Emergency Department complaining of constant moderate periumbilical abdominal pain with associated nausea, vomiting and diarrhea onset yesterday and worsening since. Reports having 1 episode of vomiting and diarrhea this morning.  Denies constipation, fever and previous history of similar abdominal pain. Patient reports he has been evaluated by a PCP for abdominal hernia 5 months ago and was given instructions to perform specific exercises, reduce food intake and prescribed a hernia belt to wear. Patient has had hernia since November.    Past Medical History  Diagnosis Date  . Bipolar disorder   . Eczema   . Tobacco abuse   . Low back pain   . Elevated blood pressure reading without diagnosis of hypertension   . History of cocaine abuse   . Abdominal wall hernia   . Poor circulation   . Generalized headaches   . Arthritis     Past Surgical History  Procedure Date  . Lipoma excision     Family History  Problem Relation Age of Onset  . Hypertension Mother   . Cancer Mother   . Heart disease Father   . Heart attack Father 87  . Hyperlipidemia Father   . Hypertension Father   . Diabetes Sister   . Diabetes Brother      History  Substance Use Topics  . Smoking status: Current Some Day Smoker -- 1.0 packs/day    Types: Cigarettes  . Smokeless tobacco: Not on file  . Alcohol Use: 4.2 oz/week    7 Glasses of wine per week      Review of Systems  Constitutional: Negative for fever.  Gastrointestinal: Positive for nausea, vomiting, abdominal pain and diarrhea. Negative for constipation.  All other systems reviewed and are negative.    Allergies  Penicillins  Home Medications   Current Outpatient Rx  Name Route Sig Dispense Refill  . ALBUTEROL SULFATE HFA 108 (90 BASE) MCG/ACT IN AERS Inhalation Inhale 2 puffs into the lungs every 4 (four) hours as needed for wheezing or shortness of breath (or cough). 1 Inhaler 0  . HYDROCOD POLST-CPM POLST ER 10-8 MG/5ML PO LQCR Oral Take 5 mLs by mouth at bedtime as needed (cough). 80 mL 0  . IBUPROFEN 200 MG PO TABS Oral Take 2 tablets (400 mg total) by mouth every 6 (six) hours as needed for pain. 60 tablet 0  . OLANZAPINE 5 MG PO TABS Oral Take 5 mg by mouth at bedtime.        BP 122/79  Pulse 52  Temp(Src) 97.8 F (36.6 C) (Oral)  Resp 21  Ht 5\' 8"  (1.727 m)  Wt 215 lb (97.523 kg)  BMI 32.69 kg/m2  SpO2 100%  Physical Exam  Nursing note and vitals reviewed. Constitutional: He  is oriented to person, place, and time. He appears well-developed and well-nourished. No distress.  HENT:  Head: Normocephalic and atraumatic.  Mouth/Throat: Oropharynx is clear and moist.  Eyes: EOM are normal. Pupils are equal, round, and reactive to light.  Neck: Neck supple. No tracheal deviation present.  Cardiovascular: Normal rate, regular rhythm and normal heart sounds.  Exam reveals no gallop and no friction rub.   No murmur heard. Pulmonary/Chest: Effort normal and breath sounds normal. No respiratory distress. He has no wheezes. He has no rales.  Abdominal: Soft. Bowel sounds are normal. He exhibits no distension. There is tenderness.       Diffuse mild  abdominal tenderness with increased tenderness to periumbilical region. Suggestion of mass palpated but indistinct.   Musculoskeletal: Normal range of motion. He exhibits no edema.  Neurological: He is alert and oriented to person, place, and time. No sensory deficit.  Skin: Skin is warm and dry.  Psychiatric: He has a normal mood and affect. His behavior is normal.    ED Course  Procedures (including critical care time)  DIAGNOSTIC STUDIES: Oxygen Saturation is 98% on room air, normal by my interpretation.    COORDINATION OF CARE: 2:45PM- Patient informed of current plan for treatment and evaluation and agrees with plan at this time. Patient's past medical records reviewed at bedside.    Labs Reviewed  BASIC METABOLIC PANEL - Abnormal; Notable for the following:    Glucose, Bld 121 (*)    All other components within normal limits  CBC  DIFFERENTIAL  URINALYSIS, ROUTINE W REFLEX MICROSCOPIC  LAB REPORT - SCANNED   Ct Abdomen Pelvis W Contrast  12/18/2011  *RADIOLOGY REPORT*  Clinical Data: Periumbilical pain.  Hernia.  Vomiting and diarrhea.  CT ABDOMEN AND PELVIS WITH CONTRAST  Technique:  Multidetector CT imaging of the abdomen and pelvis was performed following the standard protocol during bolus administration of intravenous contrast.  Contrast: OMNIPAQUE IOHEXOL 300 MG/ML IJ SOLN  Comparison: None.  Findings: The abdominal parenchymal organs are normal appearance. Gallbladder is unremarkable, and there is no evidence of hydronephrosis.  No soft tissue masses or lymphadenopathy identified within the abdomen or pelvis.  There is no evidence of inflammatory process or abnormal fluid collections.  No evidence of bowel wall thickening or dilatation.  A tiny periumbilical hernia is seen which contains only omental fat.  No evidence of herniated bowel or bowel obstruction.  IMPRESSION:  Tiny periumbilical hernia containing only fat.  No evidence of herniated bowel or other significant  abnormality.  Original Report Authenticated By: Danae Orleans, M.D.     1. Periumbilical hernia       MDM  Minor abdominal wall hernia, no indication for emergent surgical repair. Doubt sepsis, occult infection or ischemic abdominal contents.   Plan: Home Medications- advil prn; Home Treatments- rest prn; Recommended follow up- General Surgery for problems prn   I personally performed the services described in this documentation, which was scribed in my presence. The recorded information has been reviewed and considered.   Flint Melter, MD 12/20/11 0930

## 2011-12-18 NOTE — Discharge Instructions (Signed)
  You have a small hernia, that is unlikely to need surgical repair. Take ibuprofen for pain. See a Development worker, international aid. If you have persistent pain, vomiting, fever, or are unable to eat.     Hernia A hernia occurs when an internal organ pushes out through a weak spot in the abdominal wall. Hernias most commonly occur in the groin and around the navel. Hernias often can be pushed back into place (reduced). Most hernias tend to get worse over time. Some abdominal hernias can get stuck in the opening (irreducible or incarcerated hernia) and cannot be reduced. An irreducible abdominal hernia which is tightly squeezed into the opening is at risk for impaired blood supply (strangulated hernia). A strangulated hernia is a medical emergency. Because of the risk for an irreducible or strangulated hernia, surgery may be recommended to repair a hernia. CAUSES   Heavy lifting.   Prolonged coughing.   Straining to have a bowel movement.   A cut (incision) made during an abdominal surgery.  HOME CARE INSTRUCTIONS   Bed rest is not required. You may continue your normal activities.   Avoid lifting more than 10 pounds (4.5 kg) or straining.   Cough gently. If you are a smoker it is best to stop. Even the best hernia repair can break down with the continual strain of coughing. Even if you do not have your hernia repaired, a cough will continue to aggravate the problem.   Do not wear anything tight over your hernia. Do not try to keep it in with an outside bandage or truss. These can damage abdominal contents if they are trapped within the hernia sac.   Eat a normal diet.   Avoid constipation. Straining over long periods of time will increase hernia size and encourage breakdown of repairs. If you cannot do this with diet alone, stool softeners may be used.  SEEK IMMEDIATE MEDICAL CARE IF:   You have a fever.   You develop increasing abdominal pain.   You feel nauseous or vomit.   Your hernia is  stuck outside the abdomen, looks discolored, feels hard, or is tender.   You have any changes in your bowel habits or in the hernia that are unusual for you.   You have increased pain or swelling around the hernia.   You cannot push the hernia back in place by applying gentle pressure while lying down.  MAKE SURE YOU:   Understand these instructions.   Will watch your condition.   Will get help right away if you are not doing well or get worse.  Document Released: 09/23/2005 Document Revised: 09/12/2011 Document Reviewed: 05/12/2008 [facility] Patient Information 2012 [place], [place].

## 2012-02-03 ENCOUNTER — Emergency Department (HOSPITAL_COMMUNITY): Payer: Medicare Other

## 2012-02-03 ENCOUNTER — Emergency Department (HOSPITAL_COMMUNITY)
Admission: EM | Admit: 2012-02-03 | Discharge: 2012-02-03 | Disposition: A | Discharge status: Home / Self Care | Payer: Medicare Other | Attending: Emergency Medicine | Admitting: Emergency Medicine

## 2012-02-03 ENCOUNTER — Encounter (HOSPITAL_COMMUNITY): Payer: Self-pay | Admitting: Emergency Medicine

## 2012-02-03 DIAGNOSIS — F17200 Nicotine dependence, unspecified, uncomplicated: Secondary | ICD-10-CM | POA: Diagnosis not present

## 2012-02-03 DIAGNOSIS — F319 Bipolar disorder, unspecified: Secondary | ICD-10-CM | POA: Diagnosis not present

## 2012-02-03 DIAGNOSIS — S20219A Contusion of unspecified front wall of thorax, initial encounter: Secondary | ICD-10-CM | POA: Diagnosis not present

## 2012-02-03 DIAGNOSIS — I10 Essential (primary) hypertension: Secondary | ICD-10-CM | POA: Insufficient documentation

## 2012-02-03 DIAGNOSIS — R071 Chest pain on breathing: Secondary | ICD-10-CM | POA: Diagnosis not present

## 2012-02-03 DIAGNOSIS — J9819 Other pulmonary collapse: Secondary | ICD-10-CM | POA: Diagnosis not present

## 2012-02-03 DIAGNOSIS — Y9210 Unspecified residential institution as the place of occurrence of the external cause: Secondary | ICD-10-CM | POA: Insufficient documentation

## 2012-02-03 DIAGNOSIS — R918 Other nonspecific abnormal finding of lung field: Secondary | ICD-10-CM | POA: Diagnosis not present

## 2012-02-03 DIAGNOSIS — J984 Other disorders of lung: Secondary | ICD-10-CM | POA: Diagnosis not present

## 2012-02-03 HISTORY — DX: Essential (primary) hypertension: I10

## 2012-02-03 MED ORDER — HYDROCODONE-ACETAMINOPHEN 5-325 MG PO TABS: 1.0000 | ORAL_TABLET | ORAL | Status: AC | PRN
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
## 2012-02-03 NOTE — ED Provider Notes (Signed)
Medical screening examination/treatment/procedure(s) were performed by non-physician practitioner and as supervising physician I was immediately available for consultation/collaboration.   Loren Racer, MD 02/03/12 2007
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
## 2012-02-07 ENCOUNTER — Ambulatory Visit: Admitting: Family Medicine

## 2012-02-12 ENCOUNTER — Ambulatory Visit: Admitting: Family Medicine

## 2012-03-31 ENCOUNTER — Inpatient Hospital Stay (HOSPITAL_COMMUNITY)
Admission: EM | Admit: 2012-03-31 | Discharge: 2012-04-02 | DRG: 313 | Disposition: A | Discharge status: Home / Self Care | Payer: Medicaid Other | Attending: Internal Medicine | Admitting: Internal Medicine

## 2012-03-31 ENCOUNTER — Emergency Department (HOSPITAL_COMMUNITY): Payer: Medicaid Other

## 2012-03-31 ENCOUNTER — Encounter (HOSPITAL_COMMUNITY): Payer: Self-pay | Admitting: Emergency Medicine

## 2012-03-31 DIAGNOSIS — R748 Abnormal levels of other serum enzymes: Secondary | ICD-10-CM | POA: Diagnosis present

## 2012-03-31 DIAGNOSIS — R7989 Other specified abnormal findings of blood chemistry: Secondary | ICD-10-CM | POA: Diagnosis present

## 2012-03-31 DIAGNOSIS — R599 Enlarged lymph nodes, unspecified: Secondary | ICD-10-CM | POA: Diagnosis present

## 2012-03-31 DIAGNOSIS — M545 Low back pain: Secondary | ICD-10-CM

## 2012-03-31 DIAGNOSIS — F319 Bipolar disorder, unspecified: Secondary | ICD-10-CM | POA: Diagnosis not present

## 2012-03-31 DIAGNOSIS — R0789 Other chest pain: Principal | ICD-10-CM | POA: Diagnosis present

## 2012-03-31 DIAGNOSIS — I10 Essential (primary) hypertension: Secondary | ICD-10-CM | POA: Diagnosis present

## 2012-03-31 DIAGNOSIS — L905 Scar conditions and fibrosis of skin: Secondary | ICD-10-CM

## 2012-03-31 DIAGNOSIS — I472 Ventricular tachycardia, unspecified: Secondary | ICD-10-CM | POA: Diagnosis present

## 2012-03-31 DIAGNOSIS — R1110 Vomiting, unspecified: Secondary | ICD-10-CM | POA: Diagnosis not present

## 2012-03-31 DIAGNOSIS — M6282 Rhabdomyolysis: Secondary | ICD-10-CM | POA: Diagnosis present

## 2012-03-31 DIAGNOSIS — M129 Arthropathy, unspecified: Secondary | ICD-10-CM | POA: Diagnosis present

## 2012-03-31 DIAGNOSIS — R51 Headache: Secondary | ICD-10-CM | POA: Diagnosis not present

## 2012-03-31 DIAGNOSIS — L259 Unspecified contact dermatitis, unspecified cause: Secondary | ICD-10-CM | POA: Diagnosis present

## 2012-03-31 DIAGNOSIS — R030 Elevated blood-pressure reading, without diagnosis of hypertension: Secondary | ICD-10-CM

## 2012-03-31 DIAGNOSIS — R079 Chest pain, unspecified: Secondary | ICD-10-CM | POA: Diagnosis present

## 2012-03-31 DIAGNOSIS — F17200 Nicotine dependence, unspecified, uncomplicated: Secondary | ICD-10-CM | POA: Diagnosis present

## 2012-03-31 DIAGNOSIS — F528 Other sexual dysfunction not due to a substance or known physiological condition: Secondary | ICD-10-CM

## 2012-03-31 DIAGNOSIS — J439 Emphysema, unspecified: Secondary | ICD-10-CM | POA: Diagnosis not present

## 2012-03-31 DIAGNOSIS — F1410 Cocaine abuse, uncomplicated: Secondary | ICD-10-CM | POA: Diagnosis present

## 2012-03-31 DIAGNOSIS — K439 Ventral hernia without obstruction or gangrene: Secondary | ICD-10-CM

## 2012-03-31 DIAGNOSIS — I4729 Other ventricular tachycardia: Secondary | ICD-10-CM | POA: Diagnosis present

## 2012-03-31 HISTORY — DX: Major depressive disorder, single episode, unspecified: F32.9

## 2012-03-31 HISTORY — DX: Umbilical hernia without obstruction or gangrene: K42.9

## 2012-03-31 HISTORY — DX: Anxiety disorder, unspecified: F41.9

## 2012-03-31 HISTORY — DX: Depression, unspecified: F32.A

## 2012-03-31 HISTORY — DX: Schizoaffective disorder, unspecified: F25.9

## 2012-03-31 HISTORY — DX: Pneumonia, unspecified organism: J18.9

## 2012-03-31 LAB — CARDIAC PANEL(CRET KIN+CKTOT+MB+TROPI)
CK, MB: 11.1 ng/mL (ref 0.3–4.0)
CK, MB: 8.8 ng/mL (ref 0.3–4.0)
CK, MB: 8.1 ng/mL (ref 0.3–4.0)
Relative Index: 1.1 (ref 0.0–2.5)
Total CK: 1140 U/L — ABNORMAL HIGH (ref 7–232)
Total CK: 728 U/L — ABNORMAL HIGH (ref 7–232)
Troponin I: <0.3 ng/mL (ref <0.30)
Troponin I: <0.3 ng/mL (ref <0.30)

## 2012-03-31 LAB — SEDIMENTATION RATE: Sed Rate: 6 mm/hr (ref 0–16)

## 2012-03-31 LAB — LIPID PANEL
LDL Cholesterol: 83 mg/dL (ref 0–99)
Triglycerides: 67 mg/dL (ref <150)
VLDL: 13 mg/dL (ref 0–40)

## 2012-03-31 LAB — COMPREHENSIVE METABOLIC PANEL
Albumin: 4.1 g/dL (ref 3.5–5.2)
BUN: 26 mg/dL — ABNORMAL HIGH (ref 6–23)
Calcium: 9.5 mg/dL (ref 8.4–10.5)
Creatinine, Ser: 1.3 mg/dL (ref 0.50–1.35)
GFR calc Af Amer: 75 mL/min — ABNORMAL LOW (ref >90)
Glucose, Bld: 107 mg/dL — ABNORMAL HIGH (ref 70–99)
Total Protein: 7.5 g/dL (ref 6.0–8.3)

## 2012-03-31 LAB — LIPASE, BLOOD: Lipase: 28 U/L (ref 11–59)

## 2012-03-31 LAB — TSH: TSH: 2.206 u[IU]/mL (ref 0.350–4.500)

## 2012-03-31 LAB — ETHANOL: Alcohol, Ethyl (B): <11 mg/dL (ref 0–11)

## 2012-03-31 MED ORDER — ASPIRIN 81 MG PO CHEW
324.0000 mg | CHEWABLE_TABLET | Freq: Once | ORAL | Status: AC
  Administered 2012-03-31: 324 mg via ORAL
  Filled 2012-03-31: qty 4

## 2012-03-31 MED ORDER — SODIUM CHLORIDE 0.9 % IV BOLUS (SEPSIS)
1000.0000 mL | Freq: Once | INTRAVENOUS | Status: AC
  Administered 2012-03-31: 1000 mL via INTRAVENOUS

## 2012-03-31 MED ORDER — SODIUM CHLORIDE 0.9 % IJ SOLN
3.0000 mL | Freq: Two times a day (BID) | INTRAMUSCULAR | Status: DC
  Administered 2012-03-31: 3 mL via INTRAVENOUS

## 2012-03-31 MED ORDER — OXYCODONE-ACETAMINOPHEN 5-325 MG PO TABS
1.0000 | ORAL_TABLET | Freq: Four times a day (QID) | ORAL | Status: DC | PRN
  Administered 2012-03-31: 1 via ORAL
  Filled 2012-03-31: qty 1

## 2012-03-31 MED ORDER — ONDANSETRON HCL 4 MG/2ML IJ SOLN
4.0000 mg | Freq: Once | INTRAMUSCULAR | Status: AC
  Administered 2012-03-31: 4 mg via INTRAVENOUS
  Filled 2012-03-31: qty 2

## 2012-03-31 MED ORDER — NITROGLYCERIN 0.4 MG SL SUBL
SUBLINGUAL_TABLET | SUBLINGUAL | Status: AC
  Filled 2012-03-31: qty 25

## 2012-03-31 MED ORDER — SODIUM CHLORIDE 0.9 % IV SOLN
INTRAVENOUS | Status: AC
  Administered 2012-03-31 (×2): via INTRAVENOUS

## 2012-03-31 NOTE — ED Notes (Signed)
Pt c/o substernal CP with n/v that began this past Friday.  Denies SOB or diarrhea.  Pain increases with palpation.  Some RUQ pain as well.

## 2012-03-31 NOTE — Progress Notes (Signed)
UR Completed Shamela Haydon Graves-Bigelow, RN,BSN [phone]  

## 2012-03-31 NOTE — H&P (Addendum)
Andrew [name] is an 46 y.o. male.   Chief Complaint: chest pain HPI: 46 yo male with ?bipolar, has c/o cp "sscp" since Friday.  Slight sob?  Denies radiation of pain, fever, chills, cough, palp, n/v, diaphoresis. ekg not yet available in Epic, initial set of cardiac markers negative.  Pt denies any cp presently.  Notes that cp is intermittent.  Pt will be admitted for cp r/o  Past Medical History  Diagnosis Date  . Bipolar disorder   . Eczema   . Tobacco abuse   . Low back pain   . Elevated blood pressure reading without diagnosis of hypertension   . History of cocaine abuse   . Abdominal wall hernia   . Poor circulation   . Generalized headaches   . Arthritis   . Hypertension     Past Surgical History  Procedure Date  . Lipoma excision     Family History  Problem Relation Age of Onset  . Hypertension Mother   . Cancer Mother   . Heart disease Father   . Heart attack Father 69  . Hyperlipidemia Father   . Hypertension Father   . Diabetes Sister   . Diabetes Brother    Social History:  reports that he has been smoking Cigarettes.  He has been smoking about 1 pack per day. He does not have any smokeless tobacco history on file. He reports that he drinks about 4.2 ounces of alcohol per week. He reports that he does not use illicit drugs.  Allergies:  Allergies  Allergen Reactions  . Penicillins Hives     (Not in a hospital admission)  Results for orders placed during the hospital encounter of 03/31/12 (from the past 48 hour(s))  CARDIAC PANEL(CRET KIN+CKTOT+MB+TROPI)     Status: Abnormal   Collection Time   03/31/12  3:06 AM      Component Value Range Comment   Total CK 1140 (*) 7 - 232 U/L    CK, MB 11.1 (*) 0.3 - 4.0 ng/mL    Troponin I <0.30  <0.30 ng/mL    Relative Index 1.0  0.0 - 2.5   COMPREHENSIVE METABOLIC PANEL     Status: Abnormal   Collection Time   03/31/12  3:06 AM      Component Value Range Comment   Sodium 137  135 - 145 mEq/L    Potassium 3.6   3.5 - 5.1 mEq/L    Chloride 101  96 - 112 mEq/L    CO2 23  19 - 32 mEq/L    Glucose, Bld 107 (*) 70 - 99 mg/dL    BUN 26 (*) 6 - 23 mg/dL    Creatinine, Ser 1.61  0.50 - 1.35 mg/dL    Calcium 9.5  8.4 - 09.6 mg/dL    Total Protein 7.5  6.0 - 8.3 g/dL    Albumin 4.1  3.5 - 5.2 g/dL    AST 36  0 - 37 U/L    ALT 20  0 - 53 U/L    Alkaline Phosphatase 69  39 - 117 U/L    Total Bilirubin 0.8  0.3 - 1.2 mg/dL    GFR calc non Af Amer 64 (*) >90 mL/min    GFR calc Af Amer 75 (*) >90 mL/min   LIPASE, BLOOD     Status: Normal   Collection Time   03/31/12  3:06 AM      Component Value Range Comment   Lipase 28  11 -  59 U/L   ETHANOL     Status: Normal   Collection Time   03/31/12  3:06 AM      Component Value Range Comment   Alcohol, Ethyl (B) <11  0 - 11 mg/dL    Dg Chest 2 View  0/98/1191  *RADIOLOGY REPORT*  Clinical Data: Vomiting and chest pain.  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 The heart size and pulmonary vascularity are normal. The lungs appear clear and expanded without focal air space disease or consolidation. No blunting of the costophrenic angles.  No pneumothorax.  No significant change since previous study. Incidental note of a small cervical ribs.  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  No evidence of active pulmonary disease.  Original Report Authenticated By: Marlon Pel, M.D.    Review of Systems  Constitutional: Negative for fever, chills, weight loss, malaise/fatigue and diaphoresis.  HENT: Negative for hearing loss, ear pain, nosebleeds, congestion, sore throat, neck pain, tinnitus and ear discharge.   Eyes: Negative for blurred vision, double vision, photophobia, pain, discharge and redness.  Respiratory: Negative for cough, hemoptysis, sputum production, shortness of breath, wheezing and stridor.   Cardiovascular: Positive for chest pain. Negative for palpitations, orthopnea,  claudication, leg swelling and PND.  Gastrointestinal: Negative for heartburn, nausea, vomiting, abdominal pain, diarrhea, constipation, blood in stool and melena.  Genitourinary: Negative for dysuria, urgency, frequency, hematuria and flank pain.  Musculoskeletal: Negative for myalgias, back pain and joint pain.  Skin: Negative for itching and rash.  Neurological: Negative for dizziness, tingling, tremors, sensory change, speech change, focal weakness, seizures, loss of consciousness, weakness and headaches.  Endo/Heme/Allergies: Negative for environmental allergies and polydipsia. Does not bruise/bleed easily.  Psychiatric/Behavioral: Negative for depression, suicidal ideas, hallucinations and substance abuse. The patient is not nervous/anxious.     Blood pressure 123/77, pulse 66, temperature 98.3 F (36.8 C), resp. rate 16, SpO2 93.00%. Physical Exam  Constitutional: He is oriented to person, place, and time. He appears well-developed and well-nourished. No distress.  HENT:  Head: Normocephalic and atraumatic.  Mouth/Throat: No oropharyngeal exudate.  Eyes: Conjunctivae and EOM are normal. Pupils are equal, round, and reactive to light. Right eye exhibits no discharge. Left eye exhibits no discharge. No scleral icterus.  Neck: Normal range of motion. Neck supple. No JVD present. No tracheal deviation present. No thyromegaly present.  Cardiovascular: Normal rate and regular rhythm.  Exam reveals no gallop and no friction rub.   No murmur heard. Respiratory: Effort normal and breath sounds normal. No stridor. No respiratory distress. He has no wheezes. He has no rales. He exhibits no tenderness.  GI: Soft. Bowel sounds are normal. He exhibits no distension and no mass. There is no tenderness. There is no rebound and no guarding.  Musculoskeletal: Normal range of motion. He exhibits no edema and no tenderness.  Lymphadenopathy:    He has cervical adenopathy.  Neurological: He is alert and  oriented to person, place, and time. He has normal reflexes. He displays normal reflexes. No cranial nerve deficit. He exhibits normal muscle tone. Coordination normal.  Skin: Skin is warm and dry. No rash noted. He is not diaphoretic. No erythema. No pallor.  Psychiatric: He has a normal mood and affect. Judgment and thought content normal.     Assessment/Plan CP Tele Cpk, mb, trop q6h x 3 Pt notes that he has reproducile cp with palpation.  Please see if he confirms this with oncoming  physician If so then this is atypical cp and pt can be discharged if cardiac markers are negative otherwise if not consistently reproducible, please arrange stress testing.    Rhabdo NS IV Check tsh, esr, ana  Pearson Grippe 03/31/2012, 6:12 AM

## 2012-03-31 NOTE — Care Management Note (Signed)
    Page 1 of 1   04/01/2012     11:50:36 AM   CARE MANAGEMENT NOTE 04/01/2012  Patient:  Andrew [name], Andrew [name]   Account Number:  [id number]  Date Initiated:  03/31/2012  Documentation initiated by:  GRAVES-BIGELOW,Dennice Tindol  Subjective/Objective Assessment:   Pt admitted with cp.  CM received order from MD to help with medicaitons for mental Health. CM will be unalbe to assist with this effort. Pt needs to contact physician that usually writes Rx for mental health meds.     Action/Plan:   CM will continue to monitor for disposition needs.   Anticipated DC Date:  04/01/2012   Anticipated DC Plan:  HOME/SELF CARE      DC Planning Services  CM consult      Choice offered to / List presented to:             Status of service:  Completed, signed off Medicare Important Message given?   (If response is "NO", the following Medicare IM given date fields will be blank) Date Medicare IM given:   Date Additional Medicare IM given:    Discharge Disposition:  HOME/SELF CARE  Per UR Regulation:  Reviewed for med. necessity/level of care/duration of stay  If discussed at Long Length of Stay Meetings, dates discussed:    Comments:  04-01-12 [address], [place] [phone] CM spoke to pt about medications from [facility]. Pt states he has had several appointments with one MD and on the day of the appointment the MD is not available and the office has not set him up with another provider. CM did discuss issue with CSW and she placed call to [facility] to verify information. Pt states he has been without meds for 2-3 months. CSW did find out that pt has 3 appointments and he did not show up for them. CSW received appointment for April 22 2012 at 5:15.  Pt states usually gets meds from [facility].No further needs for CM at this time.

## 2012-03-31 NOTE — ED Notes (Signed)
Admitting MD at bedside.

## 2012-03-31 NOTE — ED Notes (Signed)
Pt reports chest pain x2 days mid chest but also reports recent cough / cold symptoms pain increases with deep breathing

## 2012-03-31 NOTE — Progress Notes (Signed)
Notified Dr. Susie Cassette pt having 7/10 CP while talking with adm nurse. Pt refuses NTG, pt appears to be in no distress. Pt has been sleeping through out day without difficulty. Pt will not get urine sample, states unable to get it, yet witnessed walking to BR by other staff. New orders given.

## 2012-03-31 NOTE — ED Provider Notes (Signed)
History     CSN: [id number]  Arrival date & time 03/31/12  0205   First MD Initiated Contact with Patient 03/31/12 0224      Chief Complaint  Patient presents with  . Chest Pain    (Consider location/radiation/quality/duration/timing/severity/associated sxs/prior treatment) HPI 46 yo male presents to emergency department complaining of central chest pain. Patient reports pain started on Friday, 3 days ago.  Pain lasts for minutes to hours.  Pt had episode of vomiting on Friday as well prior to onset of chest pain.  No radiation of chest pain, no sweating, sob with the chest pain.  Pt with bizarre affect, somnolent, falling asleep while talking with me.  Pt reports he does not want to answer a lot of questions as it makes him feel bad.  Pt denies h/o prior chest pain.  No recent change in activity.  Pt reports he took some codeine on Friday and earlier today for gum pain where his partials are rubbing uncomfortably.      Past Medical History  Diagnosis Date  . Bipolar disorder   . Eczema   . Tobacco abuse   . Low back pain   . Elevated blood pressure reading without diagnosis of hypertension   . History of cocaine abuse   . Abdominal wall hernia   . Poor circulation   . Generalized headaches   . Arthritis   . Hypertension     Past Surgical History  Procedure Date  . Lipoma excision     Family History  Problem Relation Age of Onset  . Hypertension Mother   . Cancer Mother   . Heart disease Father   . Heart attack Father 79  . Hyperlipidemia Father   . Hypertension Father   . Diabetes Sister   . Diabetes Brother     History  Substance Use Topics  . Smoking status: Current Some Day Smoker -- 1.0 packs/day    Types: Cigarettes  . Smokeless tobacco: Not on file  . Alcohol Use: 4.2 oz/week    7 Glasses of wine per week      Review of Systems  Unable to perform ROS: Other  pt refuses to ask  Allergies  Penicillins  Home Medications  No current outpatient  prescriptions on file.  BP 135/73  Pulse 100  Temp 98.3 F (36.8 C)  Resp 19  SpO2 96%  Physical Exam  Nursing note and vitals reviewed. Constitutional: He is oriented to person, place, and time. He appears well-developed and well-nourished. No distress.       Somnolent, bizarre affect  HENT:  Head: Normocephalic and atraumatic.  Nose: Nose normal.  Mouth/Throat: Oropharynx is clear and moist.  Eyes: Conjunctivae and EOM are normal. Pupils are equal, round, and reactive to light.  Neck: Normal range of motion. Neck supple. No JVD present. No tracheal deviation present. No thyromegaly present.  Cardiovascular: Normal rate, regular rhythm, normal heart sounds and intact distal pulses.  Exam reveals no gallop and no friction rub.   No murmur heard. Pulmonary/Chest: Effort normal and breath sounds normal. No stridor. No respiratory distress. He has no wheezes. He has no rales. He exhibits tenderness (significant tenderness over sternum).  Abdominal: Soft. Bowel sounds are normal. He exhibits no distension and no mass. There is no tenderness. There is no rebound and no guarding.  Musculoskeletal: Normal range of motion. He exhibits no edema and no tenderness.  Lymphadenopathy:    He has no cervical adenopathy.  Neurological: He is oriented  to person, place, and time. He has normal reflexes. No cranial nerve deficit. He exhibits normal muscle tone. Coordination normal.       Somnolent but arousable  Skin: Skin is dry. No rash noted. No erythema. No pallor.  Psychiatric:       Paranoid, bizarre affect    ED Course  Procedures (including critical care time)   Date: 03/31/2012  Rate: 98  Rhythm: normal sinus rhythm  QRS Axis: left  Intervals: normal  ST/T Wave abnormalities: normal  Conduction Disutrbances:none  Narrative Interpretation:   Old EKG Reviewed: unchanged   Labs Reviewed  CARDIAC PANEL(CRET KIN+CKTOT+MB+TROPI) - Abnormal; Notable for the following:    Total CK 1140  (*)     CK, MB 11.1 (*)     All other components within normal limits  COMPREHENSIVE METABOLIC PANEL - Abnormal; Notable for the following:    Glucose, Bld 107 (*)     BUN 26 (*)     GFR calc non Af Amer 64 (*)     GFR calc Af Amer 75 (*)     All other components within normal limits  LIPASE, BLOOD  ETHANOL  URINALYSIS, ROUTINE W REFLEX MICROSCOPIC  URINE RAPID DRUG SCREEN (HOSP PERFORMED)   Dg Chest 2 View  03/31/2012  *RADIOLOGY REPORT*  Clinical Data: Vomiting and chest pain.  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 The heart size and pulmonary vascularity are normal. The lungs appear clear and expanded without focal air space disease or consolidation. No blunting of the costophrenic angles.  No pneumothorax.  No significant change since previous study. Incidental note of a small cervical ribs.  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  No evidence of active pulmonary disease.  Original Report Authenticated By: Marlon Pel, M.D.     1. Chest pain   2. Cardiac enzymes elevated       MDM  46 year old male who presents with chest pain with bizarre paranoid affect and tenderness with palpation. Patient without ischemic changes on EKG, but has elevation in CK-MB with negative troponin Asian appears altered, question maybe recent cocaine use with washout syndrome, versus somnolence do to recent codeine use. Awaiting drug screen. Given abnormal lab findings, and our behavior will discuss with hospitalist for admission.        Olivia Mackie, MD 03/31/12 [phone]

## 2012-03-31 NOTE — Progress Notes (Signed)
Patient seen and examined, agree with monitoring on telemetry and cycle cardiac enzymes for at least 24 hours. Elevated CK Mild rhabdomyolysis, will continue with IV fluids, increase to 100 cc per hour Also order a 2-D echo, if no wall motion abnormalities the patient can safely be discharged home tomorrow Urine drug screen

## 2012-04-01 ENCOUNTER — Encounter (HOSPITAL_COMMUNITY): Payer: Self-pay | Admitting: Physician Assistant

## 2012-04-01 DIAGNOSIS — R079 Chest pain, unspecified: Secondary | ICD-10-CM

## 2012-04-01 DIAGNOSIS — I517 Cardiomegaly: Secondary | ICD-10-CM

## 2012-04-01 DIAGNOSIS — M6282 Rhabdomyolysis: Secondary | ICD-10-CM

## 2012-04-01 DIAGNOSIS — F1910 Other psychoactive substance abuse, uncomplicated: Secondary | ICD-10-CM

## 2012-04-01 DIAGNOSIS — I472 Ventricular tachycardia: Secondary | ICD-10-CM

## 2012-04-01 LAB — CARDIAC PANEL(CRET KIN+CKTOT+MB+TROPI)
CK, MB: 5.2 ng/mL — ABNORMAL HIGH (ref 0.3–4.0)
Total CK: 439 U/L — ABNORMAL HIGH (ref 7–232)
Troponin I: <0.3 ng/mL (ref <0.30)

## 2012-04-01 LAB — RAPID URINE DRUG SCREEN, HOSP PERFORMED
Barbiturates: NOT DETECTED
Cocaine: POSITIVE — AB
Tetrahydrocannabinol: NOT DETECTED

## 2012-04-01 LAB — ANA: Anti Nuclear Antibody(ANA): NEGATIVE

## 2012-04-01 LAB — URINALYSIS, ROUTINE W REFLEX MICROSCOPIC
Bilirubin Urine: NEGATIVE
Hgb urine dipstick: NEGATIVE
Ketones, ur: NEGATIVE mg/dL
Specific Gravity, Urine: 1.02 (ref 1.005–1.030)
Urobilinogen, UA: 1 mg/dL (ref 0.0–1.0)

## 2012-04-01 MED ORDER — ENSURE COMPLETE PO LIQD
237.0000 mL | Freq: Every day | ORAL | Status: DC
  Administered 2012-04-01: 237 mL via ORAL

## 2012-04-01 NOTE — Consult Note (Signed)
CARDIOLOGY CONSULT NOTE   Patient ID: Andrew [name] MRN: [id number] DOB/AGE: 1966-04-23 46 y.o.  Admit date: 03/31/2012  Primary Physician   Ellery Plunk, MD Primary Cardiologist   none Reason for Consultation   Chest pain  HPI: This  is a 46 y.o. male  with  history of HTN, cocaine abuse, bipolar and schizoaffective disorder who presented to the ER with 5 days of intermittent chest pain. He was in usual state of health until 03/27/12 when he came home from work, and started feeling chest pain 10/10 in his whole ches associated with N/V. Whenever he moved, the pain got worse but subsided completely when he laid down in bed.He described it as achy and intermittent. He denies any relation to exertion. He could not remember what he took for the pain on Friday 03/27/12 the day of onset. He however took motrin on Saturday 03/28/12 without any relief but pain subsided when he laid down.He experienced same trend on 6/23 and 6/24 but did not take any medications. He just laid down and it went away so he decided to come to the ER on 6/25 for evaluation. Cardiology has been consulted to further eval. Currently, he states chest only hurts 8/10 with palpation.  Past Medical History  Diagnosis Date  . Bipolar disorder   . Eczema   . Low back pain   . Elevated blood pressure reading without diagnosis of hypertension   . Abdominal wall hernia   . Poor circulation   . Arthritis   . Hypertension   . Anginal pain   . Pneumonia ~ 10/2011  . Umbilical hernia     "needs to be repaired"  . Generalized headaches     "just about every day"  . Anxiety   . Depression   . Schizoaffective disorder      Past Surgical History  Procedure Date  . Lipoma excision ~ 2010    "back left side of my head"   Allergies  Allergen Reactions  . Penicillins Hives    I have reviewed the patient's current medications   . feeding supplement  237 mL Oral Q1500  . nitroGLYCERIN      . sodium chloride  3 mL  Intravenous Q12H   . sodium chloride 100 mL/hr at 03/31/12 1758   oxyCODONE-acetaminophen  Prior to Admission medications   Not on File     History   Social History  . Marital Status: Divorced    Spouse Name: N/A    Number of Children: N/A  . Years of Education: N/A   Occupational History  . lifts and pulls concrete    Social History Main Topics  . Smoking status: Current Some Day Smoker -- 1.0 packs/day for 30 years    Types: Cigarettes  . Smokeless tobacco: Never Used  . Alcohol Use: 1.8 oz/week    3 Glasses of wine, 0 Shots of liquor per week     03/31/12 "2 bottles of wine/month; shot of liquor couple times/month  . Drug Use: Yes    Special: Cocaine     03/31/12 "don't really know when the last time I used was"  . Sexually Active: Yes   Other Topics Concern  . Not on file   Social History Narrative   Lives with girlfriend. Work involves a lot of pulling and lifting concrete, was not definite about type of work. Denies use of illegal drugs. Smokes half pack of cigarettes daily.     Family History  Problem Relation  Age of Onset  . Hypertension Mother   . Cancer Mother   . Heart disease Father   . Heart attack Father 7  . Hyperlipidemia Father   . Hypertension Father   . Diabetes Sister   . Diabetes Brother      ROS:  Full 14 point review of systems complete and found to be negative unless listed above.  Physical Exam: Blood pressure 97/65, pulse 71, temperature 98.2 F (36.8 C), temperature source Oral, resp. rate 18, height 5' 8.5" (1.74 m), weight 217 lb 14.4 oz (98.839 kg), SpO2 97.00%.  General: Well developed, well nourished, male in no acute distress Head: Eyes PERRLA,    Normocephalic and atraumatic Lungs: CTA with no wheezes. Heart: HRRR S1 S2, no rub/gallop or  murmur. pulses are 2+ extrem.   Neck: No carotid bruits. Abdomen: Bowel sounds present, hypogastric region  tender on palpation. Msk: No weakness, no joint deformities or  effusions. Extremities: No clubbing or cyanosis.  edema.  Neuro: Alert and oriented X 3. No focal deficits noted. Psych:  Good affect, responds inappropriately at intervals. Skin: No rashes or lesions noted.  Labs:   Lab Results  Component Value Date   WBC 6.1 12/18/2011   HGB 14.5 12/18/2011   HCT 42.1 12/18/2011   MCV 90.1 12/18/2011   PLT 294 12/18/2011     Lab 03/31/12 0306  NA 137  K 3.6  CL 101  CO2 23  BUN 26*  CREATININE 1.30  CALCIUM 9.5  PROT 7.5  BILITOT 0.8  ALKPHOS 69  ALT 20  AST 36  GLUCOSE 107*    Basename 04/01/12 0902 03/31/12 1940 03/31/12 1432 03/31/12 0825  CKTOTAL 439* 728* 837* 1003*  CKMB 5.2* 8.1* 8.8* 10.3*  CKMB Index 1.2 1.1 1.1 1.0  TROPONINI <0.30 <0.30 <0.30 <0.30    Lab Results  Component Value Date   CHOL 134 03/31/2012   HDL 38* 03/31/2012   LDLCALC 83 03/31/2012   TRIG 67 03/31/2012   Lipase  Date/Time Value Range Status  03/31/2012  3:06 AM 28  11 - 59 U/L Final   TSH  Date/Time Value Range Status  03/31/2012  6:28 AM 2.206  0.350 - 4.500 uIU/mL Final   Drugs of Abuse     Component Value Date/Time   LABOPIA NONE DETECTED 04/01/2012 0921   COCAINSCRNUR POSITIVE* 04/01/2012 0921   LABBENZ NONE DETECTED 04/01/2012 0921   AMPHETMU NONE DETECTED 04/01/2012 0921   THCU NONE DETECTED 04/01/2012 0921   LABBARB NONE DETECTED 04/01/2012 0921     Echo: 02/07/2009 Study Conclusions 1. Left ventricle: The cavity size was normal. Systolic function was normal. The estimated ejection fraction was in the range of 60% to 65%. Left ventricular diastolic function parameters were normal. 2. Right ventricle: The cavity size was mildly dilated.  ECG: 19-Oct-2011 17:00:07  Normal sinus rhythm Left anterior fascicular block Septal infarct , age undetermined Vent. rate 82 BPM PR interval 144 ms QRS duration 84 ms QT/QTc 382/446 ms P-R-T axes 45 -46 45  Radiology:  Dg Chest 2 View 03/31/2012  *RADIOLOGY REPORT*  Clinical Data: Vomiting and  chest pain.  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 The heart size and pulmonary vascularity are normal. The lungs appear clear and expanded without focal air space disease or consolidation. No blunting of the costophrenic angles.  No pneumothorax.  No significant change since previous study. Incidental note of a small cervical ribs.  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  No evidence of active pulmonary disease.  Original Report Authenticated By: Marlon Pel, M.D.    ASSESSMENT AND PLAN:    1.  chest pain- Pt feels pain only upon palpation of whole chest area, musculoskeletal cause is likely. ECG non-acute.  Troponins negative and CK-MB trending down.  Will schedule pt for outpatient stress test. Echo results pending. LDL normal. Will not place on ASA,BB , heparin or statin for now. Instructed to stop using cocaine.  2. Substance abuse- tested positive for cocaine this admission but denies use of illegal drugs.  I have confronted the patient about this and he admits to cocaine use.  3.Rhabdo - CK total trending down from 1003 to 439. Pt already on IV NS infusion @ 160ml/hr. Will maintain current treatment. 4. NSVT - await 2D echo. If his EF down, would consider catheterization with NSVT. Otherwise, would treat blood pressure and ask the patient to avoid cocaine.  4. Azotemia-  BUN 26. Probably due to rhabdomylysis. He is being hydrated with IV NS. Will f/u BMET in am.  5.  HTN-  Pt has hx of it. Currently stable without any meds. Will not place on any medications now.   Signed:  Lewayne Bunting, M.D.

## 2012-04-01 NOTE — Progress Notes (Signed)
*  PRELIMINARY RESULTS* Echocardiogram 2D Echocardiogram has been performed.  Jeryl Columbia 04/01/2012, 2:41 PM

## 2012-04-01 NOTE — Progress Notes (Signed)
Clinical social worker also met with pt at bedside to discuss pt current substance abuse. Pt denied all substance abuse. Pt states he doesn't drink or use illicit drugs. Pt tested positive for cocaine on urine drug screen but pt denies. Pt stated he is not interested in substance abuse resources at this time. CSW unable to complete SBIRT. Marland KitchenNo further Clinical Social Work needs, signing off.   Catha Gosselin, Theresia Majors  [phone] .04/01/2012 1300pm

## 2012-04-01 NOTE — Clinical Social Work Psychosocial (Signed)
     Clinical Social Work Department BRIEF PSYCHOSOCIAL ASSESSMENT 04/01/2012  Patient:  Andrew [name], Andrew [name]     Account Number:  [id number]     Admit date:  03/31/2012  Clinical Social Worker:  Doree Albee  Date/Time:  04/01/2012 11:53 AM  Referred by:  RN  Date Referred:  04/01/2012 Referred for  Other - See comment   Other Referral:   [facility] follow up appt.   Interview type:  Patient Other interview type:    PSYCHOSOCIAL DATA Living Status:  FRIEND(S) Admitted from facility:   Level of care:   Primary support name:  Barrett Henle Primary support relationship to patient:  FRIEND Degree of support available:   moderate    CURRENT CONCERNS Current Concerns  Other - See comment   Other Concerns:   follow up mental health    SOCIAL WORK ASSESSMENT / PLAN Clinical social worker assisted rn case manager regarding pt follow up appt at [facility]. CSW met with pt at bedside to discuss pt appointments at [facility]. Pt stated, " I've made a few appointment but i had to break them since I had to work." CSW and pt discussed policies regarding notice of cancellation of appointment and how some can be considered a no show. Pt and csw discussed that [facility] and other medical providers need 24 to 48 hours notice of cancellation of appointment or it may be a no show. CSW provided education on the [employer]. Pt verbalized understanding of making the next appointment of 7/17 at 5:15pm. Pt verbalized understanding of cancellation policy and if patient is "no show" again pt will have to complete re admission appointment.    CSW provided patient iwth appointment information.   Assessment/plan status:  No Further Intervention Required Other assessment/ plan:   Information/referral to community resources:   [employer]    PATIENTS/FAMILYS RESPONSE TO PLAN OF CARE: Pt thanked csw for concern and support and education regarding [facility] and [facility]. Pt plans to follow up with  [facility] on 7/17 at 515pm

## 2012-04-01 NOTE — Progress Notes (Signed)
INITIAL ADULT NUTRITION ASSESSMENT Date: 04/01/2012   Time: 1:54 PM  Reason for Assessment: Nutrition Risk Report  ASSESSMENT: Male 46 y.o.  Dx: chest pain  Hx:  Past Medical History  Diagnosis Date  . Bipolar disorder   . Eczema   . Low back pain   . Elevated blood pressure reading without diagnosis of hypertension   . Abdominal wall hernia   . Poor circulation   . Arthritis   . Hypertension   . Anginal pain   . Pneumonia ~ 10/2011  . Umbilical hernia     "needs to be repaired"  . Generalized headaches     "just about every day"  . Anxiety   . Depression   . Schizoaffective disorder     Related Meds:     . nitroGLYCERIN      . sodium chloride  3 mL Intravenous Q12H    Ht: 5' 8.5" (174 cm)  Wt: 217 lb 14.4 oz (98.839 kg)  Ideal Wt: 70 kg % Ideal Wt: 141%  Usual Wt: --- % Usual Wt: ---   Body mass index is 32.65 kg/(m^2).  Food/Nutrition Related Hx: unintentional weight loss > 10 lbs within the past month per admission nutrition screen  Labs:  CMP     Component Value Date/Time   NA 137 03/31/2012 0306   K 3.6 03/31/2012 0306   CL 101 03/31/2012 0306   CO2 23 03/31/2012 0306   GLUCOSE 107* 03/31/2012 0306   BUN 26* 03/31/2012 0306   CREATININE 1.30 03/31/2012 0306   CREATININE 1.08 01/16/2011 0948   CALCIUM 9.5 03/31/2012 0306   PROT 7.5 03/31/2012 0306   ALBUMIN 4.1 03/31/2012 0306   AST 36 03/31/2012 0306   ALT 20 03/31/2012 0306   ALKPHOS 69 03/31/2012 0306   BILITOT 0.8 03/31/2012 0306   GFRNONAA 64* 03/31/2012 0306   GFRAA 75* 03/31/2012 0306     Intake/Output Summary (Last 24 hours) at 04/01/12 1356 Last data filed at 03/31/12 2159  Gross per 24 hour  Intake    603 ml  Output      0 ml  Net    603 ml    Diet Order: Cardiac  Supplements/Tube Feeding: N/A  IVF:    sodium chloride Last Rate: 100 mL/hr at 03/31/12 1758    Estimated Nutritional Needs:   Kcal: 2000-2200 Protein: 100-110 gm Fluid: 2.0-2.2 L  Patient admitted with chest  pain; reports his appetite is good at this time, however, PTA he was experiencing nausea and vomiting for 3 days; reports a 15 lb weight loss during this time frame; PO intake 100% per flowsheet records; noted hx of mental illness; question accuracy of weight loss history; would like Ensure supplements during hospitalization per patient request -- RD to order.  NUTRITION DIAGNOSIS: No nutrition diagnosis at this time  RELATED TO: ---  AS EVIDENCE BY: ---  MONITORING/EVALUATION(Goals): Goal: Oral intake to meet >90% of estimated nutrition needs Monitor: PO intake, weight, labs, I/O's  EDUCATION NEEDS: -No education needs identified at this time  INTERVENTION:  Ensure Complete daily per pt request (350 kcals, 13 gm protein per 8 fl oz bottle)  RD to follow for nutrition care plan  Dietitian #: [phone]  DOCUMENTATION CODES Per approved criteria  -Obesity Unspecified    Alger Memos 04/01/2012, 1:54 PM

## 2012-04-01 NOTE — Progress Notes (Signed)
Subjective:   Chart reviewed. He indicates that the anterior chest "soreness" is better. This is worse with deep breaths or touching the area. Gives history of using cocaine approximately a week ago. Continues to smoke tobacco.  Objective  Vital signs in last 24 hours: Filed Vitals:   03/31/12 2100 04/01/12 0500 04/01/12 0551 04/01/12 1400  BP: 111/69 109/74  97/65  Pulse: 71 50 62 71  Temp: 98.3 F (36.8 C) 97.9 F (36.6 C)  98.2 F (36.8 C)  TempSrc: Oral Oral  Oral  Resp: 20 18  18   Height:      Weight:  98.839 kg (217 lb 14.4 oz)    SpO2: 95% 98%  97%   Weight change:   Intake/Output Summary (Last 24 hours) at 04/01/12 1839 Last data filed at 03/31/12 2159  Gross per 24 hour  Intake    243 ml  Output      0 ml  Net    243 ml    Physical Exam:  General Exam: Comfortable.  Respiratory System: Clear. No increased work of breathing. Reproducible upper anterior chest pain, to palpation.  Cardiovascular System: First and second heart sounds heard. Regular rate and rhythm. No JVD/murmurs.  telemetry shows mostly sinus rhythm but had an 8 beat non-sustained VT run. Gastrointestinal System: Abdomen is non distended, soft and normal bowel sounds heard.  nontender.  Central Nervous System: Alert and oriented. No focal neurological deficits. Extremities: Symmetric 5 x 5 power.  Labs:  Basic Metabolic Panel:  Lab 03/31/12 1610  NA 137  K 3.6  CL 101  CO2 23  GLUCOSE 107*  BUN 26*  CREATININE 1.30  CALCIUM 9.5  ALB --  PHOS --   Liver Function Tests:  Lab 03/31/12 0306  AST 36  ALT 20  ALKPHOS 69  BILITOT 0.8  PROT 7.5  ALBUMIN 4.1    Lab 03/31/12 0306  LIPASE 28  AMYLASE --   No results found for this basename: AMMONIA:3 in the last 168 hours CBC: No results found for this basename: WBC:3,NEUTROABS:3,HGB:3,HCT:3,MCV:5,PLT:3 in the last 168 hours Cardiac Enzymes:  Lab 04/01/12 0902 03/31/12 1940 03/31/12 1432 03/31/12 0825 03/31/12 0306  CKTOTAL 439*  728* 837* 1003* 1140*  CKMB 5.2* 8.1* 8.8* 10.3* 11.1*  CKMBINDEX -- -- -- -- --  TROPONINI <0.30 <0.30 <0.30 <0.30 <0.30   CBG:  Lab 04/01/12 1612  GLUCAP 180*    Iron Studies: No results found for this basename: IRON,TIBC,TRANSFERRIN,FERRITIN in the last 72 hours Studies/Results: Dg Chest 2 View  03/31/2012  *RADIOLOGY REPORT*  Clinical Data: Vomiting and chest pain.  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 The heart size and pulmonary vascularity are normal. The lungs appear clear and expanded without focal air space disease or consolidation. No blunting of the costophrenic angles.  No pneumothorax.  No significant change since previous study. Incidental note of a small cervical ribs.  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  No evidence of active pulmonary disease.  Original Report Authenticated By: Marlon Pel, M.D.   2-D echocardiogram:  Study Conclusions  Left ventricle: The cavity size was normal. Wall thickness was increased in a pattern of mild LVH. Systolic function was normal. The estimated ejection fraction was in the range of 60% to 65%. Wall motion was normal; there were no regional wall motion abnormalities. Left ventricular diastolic function parameters were normal.    Medications:    . sodium chloride  100 mL/hr at 03/31/12 1758      . feeding supplement  237 mL Oral Q1500  . nitroGLYCERIN      . sodium chloride  3 mL Intravenous Q12H    I  have reviewed scheduled and prn medications.     Problem/Plan: Active Problems:  Chest pain  1. Chest pain: Seems musculoskeletal in etiology. Troponins negative. Echo without abnormal findings. Cardiology consulted secondary to NSVT and will arrange outpatient stress test. Patient counseled regarding abstinence from substance abuse. Cardiology does not recommend aspirin/beta blocker/heparin or statins at this  time. 2. Rhabdomyolysis: Improving. Continue IV fluids for additional night. 3. NSVT: Cardiology consulted. Echo shows mild LVH but otherwise is okay. Await further recommendations by cardiology. 4. Polysubstance abuse: Cessation counseled. 5. Prerenal azotemia: Followup BMP tomorrow. 6. Hypertension: Currently controlled off medications.   Disposition: Possible discharge on 6/27, unless further workup recommended by cardiology.  Jkayla Spiewak 04/01/2012,6:39 PM  LOS: 1 day

## 2012-04-02 DIAGNOSIS — M6282 Rhabdomyolysis: Secondary | ICD-10-CM | POA: Diagnosis present

## 2012-04-02 DIAGNOSIS — F1410 Cocaine abuse, uncomplicated: Secondary | ICD-10-CM | POA: Diagnosis present

## 2012-04-02 DIAGNOSIS — R079 Chest pain, unspecified: Secondary | ICD-10-CM

## 2012-04-02 DIAGNOSIS — I472 Ventricular tachycardia: Secondary | ICD-10-CM

## 2012-04-02 DIAGNOSIS — F1910 Other psychoactive substance abuse, uncomplicated: Secondary | ICD-10-CM

## 2012-04-02 LAB — BASIC METABOLIC PANEL
BUN: 10 mg/dL (ref 6–23)
Calcium: 8.8 mg/dL (ref 8.4–10.5)
GFR calc non Af Amer: >90 mL/min (ref >90)
Glucose, Bld: 114 mg/dL — ABNORMAL HIGH (ref 70–99)
Sodium: 137 mEq/L (ref 135–145)

## 2012-04-02 MED ORDER — UNABLE TO FIND: Status: DC

## 2012-04-02 NOTE — Discharge Summary (Signed)
Discharge Summary  Andrew [name] MR#: [id number]  DOB:1966-01-24  Date of Admission: 03/31/2012 Date of Discharge: 04/02/2012  Patient's PCP: Ellery Plunk, MD  Attending Physician:Kameshia Madruga  Consults: 1. Cardiology: Marinus Maw, MD   Discharge Diagnoses: Principal Problem:  *Chest pain Active Problems:  TOBACCO ABUSE  Cocaine abuse  Rhabdomyolysis   Brief Admitting History and Physical 46 y.o. male with history of HTN, cocaine abuse, bipolar and schizoaffective disorder who presented to the ER with 5 days of intermittent chest pain. He was admitted for further evaluation and management.   Discharge Medications Current Discharge Medication List    START taking these medications   Details  UNABLE TO FIND To whom it may concern. This is to advise that Mr. Andrew [name] was admitted to the [facility] from 03/31/2012 to 04/02/2012 for medical evaluation and treatment. He is cleared to return to work without restrictions. Please contact us for any further assistance. Thank you, [facility]. Phone number: [phone]. Qty: 1 Units, Refills: 0        Hospital Course: Chest pain Present on Admission:  .Chest pain .TOBACCO ABUSE .Cocaine abuse .Rhabdomyolysis   1. Chest pain: Seems musculoskeletal in etiology. Troponins negative. Echo without abnormal findings. Cardiology consulted. Cardiology indicates that patient's chest pain is atypical for cardiac etiology and agree that it appears more musculoskeletal in nature. They do not recommend any ischemic workup unless symptoms change. They do not recommend any medications but have advised abstinence from cocaine. Patient has been counseled repeatedly regarding abstinence from tobacco and substance abuse. He verbalizes understanding. Cardiology has cleared him for return to work and discharge home.  2. Rhabdomyolysis:? Secondary to cocaine abuse and physical exertion: Patient denies any muscle aches.  Patient was hydrated with IV fluids and this has resolved. Patient has been counseled regarding adequately hydrating himself. 3. NSVT: Patient had 8 beat NSVT which was asymptomatic. Cardiology consulted. Echo shows mild LVH but otherwise is okay. Cardiology does not recommend any further workup or management. They have cleared him for discharge. Again counseled regarding abstinence from cocaine abuse. 4. Polysubstance abuse (tobacco and cocaine): Cessation counseled.  5. Prerenal azotemia: Resolved with IV fluids.   Day of Discharge  Complaints: Feels tired lying in bed. Denies chest pain or dyspnea or palpitations.  Physical exam: BP 108/81  Pulse 67  Temp 97.6 F (36.4 C) (Oral)  Resp 18  Ht 5' 8.5" (1.74 m)  Wt 93.895 kg (207 lb)  BMI 31.02 kg/m2  SpO2 97% General Exam: Comfortable.  Respiratory System: Clear. No increased work of breathing.  Cardiovascular System: First and second heart sounds heard. Regular rate and rhythm. No JVD/murmurs. telemetry shows bradycardia in the 50s to sinus rhythm in the 60s. No further episodes of NSVT.  Gastrointestinal System: Abdomen is non distended, soft and normal bowel sounds heard. nontender.  Central Nervous System: Alert and oriented. No focal neurological deficits.  Extremities: Symmetric 5 x 5 power.  Basic Metabolic Panel:  Lab 04/02/12 8295 03/31/12 0306  NA 137 137  K 4.0 3.6  CL 105 101  CO2 26 23  GLUCOSE 114* 107*  BUN 10 26*  CREATININE 0.99 1.30  CALCIUM 8.8 9.5  ALB -- --  PHOS -- --   Liver Function Tests:  Lab 03/31/12 0306  AST 36  ALT 20  ALKPHOS 69  BILITOT 0.8  PROT 7.5  ALBUMIN 4.1    Lab 03/31/12 0306  LIPASE 28  AMYLASE --   No results found for  this basename: AMMONIA:3 in the last 168 hours CBC: No results found for this basename: WBC:3,NEUTROABS:3,HGB:3,HCT:3,MCV:5,PLT:3 in the last 168 hours Cardiac Enzymes:  Lab 04/02/12 0500 04/01/12 0902 03/31/12 1940 03/31/12 1432 03/31/12 0825  03/31/12 0306  CKTOTAL 222 439* 728* 837* 1003* --  CKMB -- 5.2* 8.1* 8.8* 10.3* 11.1*  CKMBINDEX -- -- -- -- -- --  TROPONINI -- <0.30 <0.30 <0.30 <0.30 <0.30   CBG:  Lab 04/01/12 1612  GLUCAP 180*   Other lab data:  1. Lipid panel: Cholesterol 134, triglycerides 67, HDL 38, LDL 83 and VLDL 13. 2. ESR: 6. 3. TSH: 2.206. 4. ANA: Negative 5. Urine analysis: Not indicative of UTI. 6. UDS: Positive for cocaine. 7. Blood alcohol level: Less than 11.  Studies/Results:   Dg Chest 2 View   03/31/2012 *RADIOLOGY REPORT* Clinical Data: Vomiting and chest pain. CHEST - 2 VIEW Comparison: 02/03/2012 Findings: Bullous emphysematous changes in the upper lungs. The heart size and pulmonary vascularity are normal. The lungs appear clear and expanded without focal air space disease or consolidation. No blunting of the costophrenic angles. No pneumothorax. No significant change since previous study. Incidental note of a small cervical ribs. 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 No evidence of active pulmonary disease. Original Report Authenticated By: Marlon Pel, M.D.   2-D echocardiogram:   Study Conclusions  Left ventricle: The cavity size was normal. Wall thickness was increased in a pattern of mild LVH. Systolic function was normal. The estimated ejection fraction was in the range of 60% to 65%. Wall motion was normal; there were no regional wall motion abnormalities. Left ventricular diastolic function parameters were normal.    Disposition: Discharged home in stable condition.  Diet: Heart healthy.  Activity: Ad lib.   Follow-up Appts: Discharge Orders    Future Orders Please Complete By Expires   Diet - low sodium heart healthy      Activity as tolerated - No restrictions      Call MD for:  severe uncontrolled pain         TESTS THAT NEED FOLLOW-UP None.  Time spent on discharge, talking to the patient, and coordinating care: Less than 30  mins.   SignedMarcellus Scott, MD 04/02/2012, 11:39 AM

## 2012-04-02 NOTE — Progress Notes (Signed)
Went over discharge instructions with patient. Gave smoking cessation resources and numbers and told him to call and encouraged him to wean himself from cigarettes by reducing it a little at a time. Also gave information on cocaine and chest pain. Pt verbalizes understanding. Pt to be discharged walking out to private vehicle. Pt stable at this time. No other needs expressed.

## 2012-04-02 NOTE — Progress Notes (Signed)
Subjective: No CP or SOB at rest. Objective: Filed Vitals:   04/01/12 0551 04/01/12 1400 04/01/12 2100 04/02/12 0500  BP:  97/65 126/74 108/81  Pulse: 62 71 70 67  Temp:  98.2 F (36.8 C) 98.2 F (36.8 C) 97.6 F (36.4 C)  TempSrc:  Oral Oral Oral  Resp:  18 18 18   Height:      Weight:    207 lb (93.895 kg)  SpO2:  97% 98% 97%   Weight change: -14.4 oz (-0.408 kg)  Intake/Output Summary (Last 24 hours) at 04/02/12 0906 Last data filed at 04/01/12 1952  Gross per 24 hour  Intake    240 ml  Output      0 ml  Net    240 ml    General: Alert, awake, oriented x3, in no acute distress Neck:  JVP is normal Heart: Regular rate and rhythm, without murmurs, rubs, gallops.  Chest:  Mild discomfort with pressing. Lungs: Clear to auscultation.  No rales or wheezes. Exemities:  No edema.   Neuro: Grossly intact, nonfocal.   Lab Results: Results for orders placed during the hospital encounter of 03/31/12 (from the past 24 hour(s))  URINALYSIS, ROUTINE W REFLEX MICROSCOPIC     Status: Normal   Collection Time   04/01/12  9:21 AM      Component Value Range   Color, Urine YELLOW  YELLOW   APPearance CLEAR  CLEAR   Specific Gravity, Urine 1.020  1.005 - 1.030   pH 6.0  5.0 - 8.0   Glucose, UA NEGATIVE  NEGATIVE mg/dL   Hgb urine dipstick NEGATIVE  NEGATIVE   Bilirubin Urine NEGATIVE  NEGATIVE   Ketones, ur NEGATIVE  NEGATIVE mg/dL   Protein, ur NEGATIVE  NEGATIVE mg/dL   Urobilinogen, UA 1.0  0.0 - 1.0 mg/dL   Nitrite NEGATIVE  NEGATIVE   Leukocytes, UA NEGATIVE  NEGATIVE  URINE RAPID DRUG SCREEN (HOSP PERFORMED)     Status: Abnormal   Collection Time   04/01/12  9:21 AM      Component Value Range   Opiates NONE DETECTED  NONE DETECTED   Cocaine POSITIVE (*) NONE DETECTED   Benzodiazepines NONE DETECTED  NONE DETECTED   Amphetamines NONE DETECTED  NONE DETECTED   Tetrahydrocannabinol NONE DETECTED  NONE DETECTED   Barbiturates NONE DETECTED  NONE DETECTED  GLUCOSE,  CAPILLARY     Status: Abnormal   Collection Time   04/01/12  4:12 PM      Component Value Range   Glucose-Capillary 180 (*) 70 - 99 mg/dL   Comment 1 Notify RN    BASIC METABOLIC PANEL     Status: Abnormal   Collection Time   04/02/12  5:00 AM      Component Value Range   Sodium 137  135 - 145 mEq/L   Potassium 4.0  3.5 - 5.1 mEq/L   Chloride 105  96 - 112 mEq/L   CO2 26  19 - 32 mEq/L   Glucose, Bld 114 (*) 70 - 99 mg/dL   BUN 10  6 - 23 mg/dL   Creatinine, Ser 4.09  0.50 - 1.35 mg/dL   Calcium 8.8  8.4 - 81.1 mg/dL   GFR calc non Af Amer >90  >90 mL/min   GFR calc Af Amer >90  >90 mL/min  CK     Status: Normal   Collection Time   04/02/12  5:00 AM      Component Value Range   Total  CK 222  7 - 232 U/L    Studies/Results: No results found.  Medications: Reviewed  Tele:  SR.   Patient Active Hospital Problem List: Chest pain (03/31/2012)   Assessment: CP is atypical for cardiac.  Appears more musculoskeletal.  Echo normal.  I would not schedule stress test unless symptoms change (not brought on/ exacerbated by pressing chest, pain exertional with SOB).   Discussed with patient. Understands.  Cocaine:  Counselled on stopping.  OK to d/c from cardiac standpoint.  Return to work.  LOS: 2 days   Dietrich Pates 04/02/2012, 9:06 AM

## 2012-05-26 ENCOUNTER — Emergency Department (HOSPITAL_COMMUNITY)
Admission: EM | Admit: 2012-05-26 | Discharge: 2012-05-26 | Disposition: A | Discharge status: Home / Self Care | Payer: Medicare Other | Attending: Emergency Medicine | Admitting: Emergency Medicine

## 2012-05-26 ENCOUNTER — Emergency Department (HOSPITAL_COMMUNITY): Payer: Medicare Other

## 2012-05-26 ENCOUNTER — Encounter (HOSPITAL_COMMUNITY): Payer: Self-pay | Admitting: *Deleted

## 2012-05-26 DIAGNOSIS — R079 Chest pain, unspecified: Secondary | ICD-10-CM | POA: Insufficient documentation

## 2012-05-26 DIAGNOSIS — M79609 Pain in unspecified limb: Secondary | ICD-10-CM | POA: Insufficient documentation

## 2012-05-26 DIAGNOSIS — L0390 Cellulitis, unspecified: Secondary | ICD-10-CM

## 2012-05-26 DIAGNOSIS — L02419 Cutaneous abscess of limb, unspecified: Secondary | ICD-10-CM | POA: Insufficient documentation

## 2012-05-26 DIAGNOSIS — J439 Emphysema, unspecified: Secondary | ICD-10-CM | POA: Diagnosis not present

## 2012-05-26 DIAGNOSIS — M7989 Other specified soft tissue disorders: Secondary | ICD-10-CM

## 2012-05-26 DIAGNOSIS — R918 Other nonspecific abnormal finding of lung field: Secondary | ICD-10-CM | POA: Diagnosis not present

## 2012-05-26 DIAGNOSIS — J9819 Other pulmonary collapse: Secondary | ICD-10-CM | POA: Diagnosis not present

## 2012-05-26 DIAGNOSIS — F319 Bipolar disorder, unspecified: Secondary | ICD-10-CM | POA: Diagnosis not present

## 2012-05-26 DIAGNOSIS — R4182 Altered mental status, unspecified: Secondary | ICD-10-CM | POA: Diagnosis not present

## 2012-05-26 LAB — ETHANOL: Alcohol, Ethyl (B): <11 mg/dL (ref 0–11)

## 2012-05-26 LAB — COMPREHENSIVE METABOLIC PANEL
Albumin: 3 g/dL — ABNORMAL LOW (ref 3.5–5.2)
BUN: 7 mg/dL (ref 6–23)
Creatinine, Ser: 0.87 mg/dL (ref 0.50–1.35)
Total Bilirubin: 0.2 mg/dL — ABNORMAL LOW (ref 0.3–1.2)
Total Protein: 6.2 g/dL (ref 6.0–8.3)

## 2012-05-26 LAB — CBC WITH DIFFERENTIAL/PLATELET
Eosinophils Absolute: 0.3 10*3/uL (ref 0.0–0.7)
Eosinophils Relative: 5 % (ref 0–5)
Hemoglobin: 14.2 g/dL (ref 13.0–17.0)
Lymphs Abs: 1.8 10*3/uL (ref 0.7–4.0)
MCH: 31.3 pg (ref 26.0–34.0)
MCV: 90.9 fL (ref 78.0–100.0)
Monocytes Relative: 9 % (ref 3–12)
RBC: 4.53 MIL/uL (ref 4.22–5.81)

## 2012-05-26 LAB — POCT I-STAT, CHEM 8
Creatinine, Ser: 1 mg/dL (ref 0.50–1.35)
Glucose, Bld: 94 mg/dL (ref 70–99)
Hemoglobin: 13.9 g/dL (ref 13.0–17.0)
TCO2: 28 mmol/L (ref 0–100)

## 2012-05-26 LAB — POCT I-STAT TROPONIN I: Troponin i, poc: 0 ng/mL (ref 0.00–0.08)

## 2012-05-26 MED ORDER — CEPHALEXIN 500 MG PO CAPS: 500.0000 mg | ORAL_CAPSULE | Freq: Four times a day (QID) | ORAL | Status: AC

## 2012-05-26 MED ORDER — DOXYCYCLINE HYCLATE 100 MG IV SOLR
100.0000 mg | Freq: Once | INTRAVENOUS | Status: AC
  Administered 2012-05-26: 100 mg via INTRAVENOUS
  Filled 2012-05-26: qty 100

## 2012-05-26 MED ORDER — SODIUM CHLORIDE 0.9 % IV BOLUS (SEPSIS)
1000.0000 mL | Freq: Once | INTRAVENOUS | Status: AC
  Administered 2012-05-26: 1000 mL via INTRAVENOUS

## 2012-05-26 MED ORDER — ASPIRIN 81 MG PO CHEW
324.0000 mg | CHEWABLE_TABLET | Freq: Once | ORAL | Status: AC
  Administered 2012-05-26: 324 mg via ORAL
  Filled 2012-05-26: qty 4

## 2012-05-26 MED ORDER — SULFAMETHOXAZOLE-TRIMETHOPRIM 800-160 MG PO TABS: 1.0000 | ORAL_TABLET | Freq: Two times a day (BID) | ORAL | Status: AC

## 2012-05-26 NOTE — ED Notes (Signed)
Pt lethargic, groggy. Pt responding to voice. Pt appears irritable. Not receptive to request for urine or blood draw. Pt reporting "don't talk to me in the hospitality voice, I don't need nice from you".

## 2012-05-26 NOTE — ED Notes (Signed)
Patient unable to void at this time

## 2012-05-26 NOTE — Progress Notes (Signed)
VASCULAR LAB PRELIMINARY  PRELIMINARY  PRELIMINARY  PRELIMINARY  Right lower extremity venous duplex completed.    Preliminary report:  Right:  No evidence of DVT, superficial thrombosis, or Baker's cyst.  Sumaiya Arruda, RVT 05/26/2012, 5:10 PM

## 2012-05-26 NOTE — ED Notes (Signed)
RN asked for urine. Pt. Unable to provide.

## 2012-05-26 NOTE — ED Notes (Signed)
Pt c/o right leg pain and swelling. Redness and some edema noted. Pt. Is lethargic, Drifts off to sleep during assessment but is arousable, pt.s speech is slurred, and eyes are red. Pt. Denies any ETOH or drug use. Pt. Currently denies CP.

## 2012-05-26 NOTE — ED Notes (Signed)
Vascular at bedside

## 2012-05-26 NOTE — ED Notes (Signed)
Asked patient again for urine sample, which has been asked multiple times, pt was irriated refusing to provide sample. PA notified reporting do not worry with sample .

## 2012-05-26 NOTE — ED Notes (Signed)
Pt is here with chest pain that has been intermittent and denies pain at this time.  Pt has swelling in bilateral lower extremities but has problem in right leg and looks bruised and thinks he got bite by something

## 2012-05-26 NOTE — ED Notes (Signed)
Waiting for IV antibiotics, then patient will be discharged

## 2012-05-26 NOTE — ED Provider Notes (Signed)
History     CSN: [id number]  Arrival date & time 05/26/12  1042   First MD Initiated Contact with Patient 05/26/12 1304      Chief Complaint  Patient presents with  . Chest Pain    (Consider location/radiation/quality/duration/timing/severity/associated sxs/prior treatment) The history is provided by a friend and the patient.   46 y.o. in no acute distress complaining of pain and swelling to right lower extremity 7 days ago. He thinks he may have been bitten by a spider. He denies fever, nausea vomiting, chest pain. Patient's altered and somnolent but rousable. Level V caveat for AMS. He will not respond when asked about alcohol or drug abuse. He is here with friend who states she does not know if he uses alcohol or drugs.  Past Medical History  Diagnosis Date  . Bipolar disorder   . Eczema   . Low back pain   . Elevated blood pressure reading without diagnosis of hypertension   . Abdominal wall hernia   . Poor circulation   . Arthritis   . Hypertension   . Anginal pain   . Pneumonia ~ 10/2011  . Umbilical hernia     "needs to be repaired"  . Generalized headaches     "just about every day"  . Anxiety   . Depression   . Schizoaffective disorder     Past Surgical History  Procedure Date  . Lipoma excision ~ 2010    "back left side of my head"    Family History  Problem Relation Age of Onset  . Hypertension Mother   . Cancer Mother   . Heart disease Father   . Heart attack Father 60  . Hyperlipidemia Father   . Hypertension Father   . Diabetes Sister   . Diabetes Brother     History  Substance Use Topics  . Smoking status: Current Some Day Smoker -- 1.0 packs/day for 30 years    Types: Cigarettes  . Smokeless tobacco: Never Used  . Alcohol Use: 1.8 oz/week    3 Glasses of wine, 0 Shots of liquor per week     03/31/12 "2 bottles of wine/month; shot of liquor couple times/month      Review of Systems  Unable to perform ROS: Other  All other systems  reviewed and are negative.    Allergies  Penicillins  Home Medications  No current outpatient prescriptions on file.  BP 116/69  Pulse 78  Temp 98.1 F (36.7 C) (Oral)  Resp 18  SpO2 97%  Physical Exam  Vitals reviewed. Constitutional: He is oriented to person, place, and time. He appears well-developed and well-nourished. No distress.  HENT:  Head: Normocephalic.  Eyes: Conjunctivae and EOM are normal. Pupils are equal, round, and reactive to light.  Cardiovascular: Normal rate, regular rhythm, normal heart sounds and intact distal pulses.   Pulmonary/Chest: Effort normal and breath sounds normal. No respiratory distress. He has no wheezes. He has no rales. He exhibits no tenderness.  Abdominal: Soft. Bowel sounds are normal.  Musculoskeletal: Normal range of motion.  Neurological: He is oriented to person, place, and time.  Skin:       10 x 15 cm of induration, tenderness and warmth to right lateral ankle. Generalized swelling to the area as well.  Psychiatric: He has a normal mood and affect.       Somnolent but arousable, patient is uncooperative with neurological examination    ED Course  Procedures (including critical care time)  Labs  Reviewed  POCT I-STAT, CHEM 8 - Abnormal; Notable for the following:    Potassium 3.4 (*)     Calcium, Ion 1.28 (*)     All other components within normal limits  CBC WITH DIFFERENTIAL  URINALYSIS, ROUTINE W REFLEX MICROSCOPIC  COMPREHENSIVE METABOLIC PANEL  URINE RAPID DRUG SCREEN (HOSP PERFORMED)  ETHANOL   No results found.   Date: 05/26/2012  Rate: 64  Rhythm: normal sinus rhythm  QRS Axis: left  Intervals: normal  ST/T Wave abnormalities: nonspecific ST/T changes  Conduction Disutrbances:none  Narrative Interpretation:   Old EKG Reviewed: unchanged    1. Cellulitis       MDM  Patient is giving inconsistent reports he now denies chest pain, which is not consistent with the triage note.   Patient is not  falling following commands will not comply with neurological examination. I suspect that this is an intoxication, however, neurological process cannot be ruled out. I tended to obtain a urine drug screen the patient refused and reassured this is an intoxication scenario based on resolution of somnolence and agitation over the course of his stay here.  Patient has cellulitis to the right lower extremity at will give him a dose of doxycycline IV. I discharged him on Bactrim and Keflex. Venous duplex to rule out DVT showed no clots.   Pt verbalized understanding and agrees with care plan. Outpatient follow-up and return precautions given.          Wynetta Emery, PA-C 05/26/12 1725

## 2012-05-26 NOTE — ED Notes (Signed)
Pt ask for a urine, pt states that he is tired because he has not been sleeping and he did not have to urinate at this time

## 2012-05-28 NOTE — ED Provider Notes (Signed)
Medical screening examination/treatment/procedure(s) were performed by non-physician practitioner and as supervising physician I was immediately available for consultation/collaboration.   Loren Racer, MD 05/28/12 [phone]

## 2012-07-02 ENCOUNTER — Ambulatory Visit (INDEPENDENT_AMBULATORY_CARE_PROVIDER_SITE_OTHER): Payer: Medicaid Other | Admitting: Family Medicine

## 2012-07-02 ENCOUNTER — Encounter: Payer: Self-pay | Admitting: Family Medicine

## 2012-07-02 VITALS — BP 125/82 | HR 74 | Temp 98.1°F | Wt 208.0 lb

## 2012-07-02 DIAGNOSIS — Z23 Encounter for immunization: Secondary | ICD-10-CM

## 2012-07-02 DIAGNOSIS — J069 Acute upper respiratory infection, unspecified: Secondary | ICD-10-CM | POA: Diagnosis not present

## 2012-07-02 MED ORDER — BENZONATATE 200 MG PO CAPS: 200.0000 mg | ORAL_CAPSULE | Freq: Two times a day (BID) | ORAL | Status: DC | PRN

## 2012-07-02 MED ORDER — ONDANSETRON HCL 4 MG PO TABS: 4.0000 mg | ORAL_TABLET | Freq: Three times a day (TID) | ORAL | Status: DC | PRN

## 2012-07-02 NOTE — Progress Notes (Signed)
Patient ID: Andrew [name], male   DOB: [birth year], 46 y.o.   MRN: [id number]  Andrew [name] is a 46 y.o. male who presents complaining of 2-3 days of cold symptoms. He has been around a nephew who has been sick. He has had a cough that produces green sputum. Two days ago he says he had a fever to 103-104 but it has since come down. Has thrown up and is having trouble keeping anything down. Nose has been stuffy and he has had a headache. No diarrhea. Has tried mucinex, cough syrup, and tussinex. These helped some.  ROS- Per HPI  PMH Smokes around 3 cigarettes per day  Exam: Gen: NAD HEENT: MMM, R TM clear, L TM with some fluid but no erythema or bulging. No cervical LAD. Heart: RRR Lungs: CTAB Psych: unusual affect, seems strangely fixated on me being his primary doctor.

## 2012-07-02 NOTE — Assessment & Plan Note (Signed)
Appears well hydrated on exam. I do not see anything on his exam that suggests the need for antibiotics. Will rx tessalon pearls and zofran. He can also try mucinex OTC and saline nasal spray. F/u as needed with PCP.

## 2012-07-02 NOTE — Patient Instructions (Addendum)
I am giving you a medicine for your cough and for your nausea. You can also take Mucinex, which you can buy over the counter. You can also try nasal saline spray. If you are still having symptoms next week, you can make an appointment with Dr. Madolyn Frieze, your primary doctor.

## 2012-07-03 ENCOUNTER — Telehealth: Payer: Self-pay | Admitting: Family Medicine

## 2012-07-03 ENCOUNTER — Telehealth: Payer: Self-pay | Admitting: *Deleted

## 2012-07-03 ENCOUNTER — Other Ambulatory Visit: Payer: Self-pay | Admitting: Family Medicine

## 2012-07-03 DIAGNOSIS — J069 Acute upper respiratory infection, unspecified: Secondary | ICD-10-CM

## 2012-07-03 MED ORDER — PROMETHAZINE HCL 25 MG PO TABS: 25.0000 mg | ORAL_TABLET | Freq: Four times a day (QID) | ORAL | Status: DC | PRN

## 2012-07-03 NOTE — Telephone Encounter (Signed)
Called back and actually spoke with patient and gave him message .

## 2012-07-03 NOTE — Telephone Encounter (Signed)
Patient is calling because he needs to get his med straightened out so that his insurance will cover them.

## 2012-07-03 NOTE — Telephone Encounter (Signed)
Pt stated that he cannot afford to get the Robitussin and is asking that some other kind of cough med be sent into the pharmacy. He rides the bus and is still at the pharmacy waiting for an answer. [facility] [address].Loralee Pacas [place]

## 2012-07-03 NOTE — Telephone Encounter (Signed)
Paged Dr. Pollie Meyer and she states she has checked with Dr. Deirdre Priest and there is no medication that medicaid will cover. Will need to but something OTC. Message left on patient's voicemail.

## 2012-07-03 NOTE — Telephone Encounter (Signed)
I spoke with Dr. Deirdre Priest regarding other options. Unfortunately there is not a cough medicine that Medicaid will cover so buying a cough medicine over the counter is all we can offer him. Communicated this to Myrlene Broker, who will call pt to let him know.

## 2012-07-03 NOTE — Telephone Encounter (Signed)
PA required for ondansetron. PA form placed in intern  "to do box."for Dr.  Casper Harrison . Will also send to Dr. Pollie Meyer.

## 2012-07-03 NOTE — Telephone Encounter (Signed)
Dr. Bluford Kaufmann park has never met patient.  Will need an appt. Fleeger, Maryjo Rochester

## 2012-07-03 NOTE — Assessment & Plan Note (Signed)
Insurance will not cover zofran or tessalon, so I will send in an rx for phenergan. He will need to do OTC cough medicine. Routed message to [facility] red pool so they can contact him about this.

## 2012-07-03 NOTE — Telephone Encounter (Signed)
I will send in a prescription for phenergan for nausea. Medicaid will not cover cough medicine, so he will need to try robitussin or some other over the counter cough medicine, which he can buy at a pharmacy over the counter. Please call pt to let him know this.

## 2012-07-03 NOTE — Telephone Encounter (Signed)
Called pt. LMVM.  Waiting for call back. Please see Dr.McIntyre's message. Lorenda Hatchet, Renato Battles

## 2012-07-15 ENCOUNTER — Telehealth: Payer: Self-pay | Admitting: Family Medicine

## 2012-07-15 NOTE — Telephone Encounter (Signed)
Pt has a personal question to ask nurse

## 2012-07-15 NOTE — Telephone Encounter (Signed)
Returned call to patient and left message to call our office back.  Richardson, Jeannette Ann, RN  

## 2012-07-16 NOTE — Telephone Encounter (Signed)
Called patient again.  Patient c/o "dark red blood" in stool and describes color as "asphalt."  Has hx of abdominal hernia and performed abdominal crunches 3 days ago.  Has had bowel movement and dark blood afterwards on Tuesday, yesterday, and today.  Offered an appt for today, but patient lives in [place] and unable to come today.  Appt scheduled for tomorrow morning at 11:15am with Dr. Lula Olszewski.  Gaylene Brooks, RN

## 2012-07-17 ENCOUNTER — Ambulatory Visit (INDEPENDENT_AMBULATORY_CARE_PROVIDER_SITE_OTHER): Payer: Medicaid Other | Admitting: Family Medicine

## 2012-07-17 ENCOUNTER — Ambulatory Visit
Admission: RE | Admit: 2012-07-17 | Discharge: 2012-07-17 | Disposition: A | Discharge status: Home / Self Care | Payer: Medicaid Other | Source: Ambulatory Visit | Attending: Family Medicine | Admitting: Family Medicine

## 2012-07-17 ENCOUNTER — Encounter: Payer: Self-pay | Admitting: Family Medicine

## 2012-07-17 VITALS — BP 121/83 | HR 58 | Ht 68.5 in | Wt 216.0 lb

## 2012-07-17 DIAGNOSIS — Z9289 Personal history of other medical treatment: Secondary | ICD-10-CM | POA: Insufficient documentation

## 2012-07-17 DIAGNOSIS — R042 Hemoptysis: Secondary | ICD-10-CM | POA: Diagnosis not present

## 2012-07-17 DIAGNOSIS — R7611 Nonspecific reaction to tuberculin skin test without active tuberculosis: Secondary | ICD-10-CM | POA: Diagnosis not present

## 2012-07-17 DIAGNOSIS — R079 Chest pain, unspecified: Secondary | ICD-10-CM | POA: Diagnosis not present

## 2012-07-17 DIAGNOSIS — K649 Unspecified hemorrhoids: Secondary | ICD-10-CM | POA: Diagnosis not present

## 2012-07-17 MED ORDER — ABDOMINAL BINDER/ELASTIC MED MISC: 1.0000 | Freq: Once | Status: DC

## 2012-07-17 NOTE — Assessment & Plan Note (Signed)
Suspect this is from throat irritation from URI.  However, with complaints of hot/cold at night, feel need to rule out active TB.

## 2012-07-17 NOTE — Progress Notes (Signed)
  Subjective:    Patient ID: Andrew [name], male    DOB: 04/23/1966, 46 y.o.   MRN: [id number]  HPI  Andrew [name] comes in with several complaints.    He has an abdominal hernia.  He says a few weeks ago he decided to start working out again.  He says that he was doing some abdominal muscle exercises 3 days ago and had a sharp pain.  Since then, he has had some blood in his stool.  It is mostly when he wipes, but he does feel like there has been some in his stool.  He denies nausea/vomiting/diarrhea.  He has had nasal congestion for a few weeks, and has developed a cough.  For the past 3 days he has had some blood tinged sputum when he coughs up phlegm.  He says it is streaks of blood, not a lot.  He has a history of +PPD, but took 6 months of antibiotics from the [facility].  He says sometimes at night he gets hot and cold, but has not taken his temperature.   Past Medical History  Diagnosis Date  . Bipolar disorder   . Eczema   . Low back pain   . Elevated blood pressure reading without diagnosis of hypertension   . Abdominal wall hernia   . Poor circulation   . Arthritis   . Hypertension   . Anginal pain   . Pneumonia ~ 10/2011  . Umbilical hernia     "needs to be repaired"  . Generalized headaches     "just about every day"  . Anxiety   . Depression   . Schizoaffective disorder    Family History  Problem Relation Age of Onset  . Hypertension Mother   . Cancer Mother   . Heart disease Father   . Heart attack Father 68  . Hyperlipidemia Father   . Hypertension Father   . Diabetes Sister   . Diabetes Brother    History  Substance Use Topics  . Smoking status: Current Some Day Smoker -- 1.0 packs/day for 30 years    Types: Cigarettes  . Smokeless tobacco: Never Used  . Alcohol Use: 1.8 oz/week    3 Glasses of wine, 0 Shots of liquor per week     03/31/12 "2 bottles of wine/month; shot of liquor couple times/month     Review of Systems See HPI    Objective:   Physical Exam BP 121/83  Pulse 58  Ht 5' 8.5" (1.74 m)  Wt 216 lb (97.977 kg)  BMI 32.36 kg/m2 General appearance: alert, cooperative and no distress Throat: lips, mucosa, and tongue normal; teeth and gums normal Abdomen: soft, non-tender; bowel sounds normal; no masses,  no organomegaly and small abdominal wall hernia below umbilicas. Rectal: normal tone, normal prostate, no masses or tenderness and There is an external hemorrhoid. Extremities: extremities normal, atraumatic, no cyanosis or edema Pulses: 2+ and symmetric       Assessment & Plan:

## 2012-07-17 NOTE — Assessment & Plan Note (Signed)
Reassured him, gave hand out about hemorrhoid treatment.

## 2012-07-17 NOTE — Patient Instructions (Signed)
Hemorrhoids Hemorrhoids are enlarged (dilated) veins around the rectum. There are 2 types of hemorrhoids, and the type of hemorrhoid is determined by its location. Internal hemorrhoids occur in the veins just inside the rectum.They are usually not painful, but they may bleed.However, they may poke through to the outside and become irritated and painful. External hemorrhoids involve the veins outside the anus and can be felt as a painful swelling or hard lump near the anus.They are often itchy and may crack and bleed. Sometimes clots will form in the veins. This makes them swollen and painful. These are called thrombosed hemorrhoids. CAUSES Causes of hemorrhoids include:  Pregnancy. This increases the pressure in the hemorrhoidal veins.  Constipation.  Straining to have a bowel movement.  Obesity.  Heavy lifting or other activity that caused you to strain. TREATMENT Most of the time hemorrhoids improve in 1 to 2 weeks. However, if symptoms do not seem to be getting better or if you have a lot of rectal bleeding, your caregiver may perform a procedure to help make the hemorrhoids get smaller or remove them completely.Possible treatments include:  Rubber band ligation. A rubber band is placed at the base of the hemorrhoid to cut off the circulation.  Sclerotherapy. A chemical is injected to shrink the hemorrhoid.  Infrared light therapy. Tools are used to burn the hemorrhoid.  Hemorrhoidectomy. This is surgical removal of the hemorrhoid. HOME CARE INSTRUCTIONS   Increase fiber in your diet. Ask your caregiver about using fiber supplements.  Drink enough water and fluids to keep your urine clear or pale yellow.  Exercise regularly.  Go to the bathroom when you have the urge to have a bowel movement. Do not wait.  Avoid straining to have bowel movements.  Keep the anal area dry and clean.  Only take over-the-counter or prescription medicines for pain, discomfort, or fever as  directed by your caregiver. SEEK MEDICAL CARE IF:   You have increasing pain and swelling that is not controlled with your medicine.  You have uncontrolled bleeding.  You have difficulty or you are unable to have a bowel movement.  You have pain or inflammation outside the area of the hemorrhoids.  You have chills or an oral temperature above 102 F (38.9 C). MAKE SURE YOU:   Understand these instructions.  Will watch your condition.  Will get help right away if you are not doing well or get worse. Document Released: 09/20/2000 Document Revised: 12/16/2011 Document Reviewed: 09/03/2010 [facility] Patient Information 2013 [place], [place].

## 2012-07-17 NOTE — Assessment & Plan Note (Signed)
Will obtain CXR to rule out active TB.

## 2012-07-20 ENCOUNTER — Telehealth: Payer: Self-pay | Admitting: Family Medicine

## 2012-07-20 NOTE — Telephone Encounter (Signed)
Patient would like to speak to the nurse about the elastic binder that was prescribed.  He has some questions.

## 2012-07-20 NOTE — Telephone Encounter (Signed)
Pt states [facility] does not have the abdominal binder, gave pt #s for [facility] and [employer] for pt to call

## 2012-07-29 ENCOUNTER — Encounter: Payer: Medicaid Other | Admitting: Family Medicine

## 2012-11-17 ENCOUNTER — Emergency Department (HOSPITAL_COMMUNITY)
Admission: EM | Admit: 2012-11-17 | Discharge: 2012-11-17 | Disposition: A | Discharge status: Home / Self Care | Payer: Medicare Other | Attending: Emergency Medicine | Admitting: Emergency Medicine

## 2012-11-17 ENCOUNTER — Encounter (HOSPITAL_COMMUNITY): Payer: Self-pay | Admitting: Emergency Medicine

## 2012-11-17 ENCOUNTER — Emergency Department (HOSPITAL_COMMUNITY): Payer: Medicare Other

## 2012-11-17 DIAGNOSIS — S8990XA Unspecified injury of unspecified lower leg, initial encounter: Secondary | ICD-10-CM | POA: Diagnosis not present

## 2012-11-17 DIAGNOSIS — F17200 Nicotine dependence, unspecified, uncomplicated: Secondary | ICD-10-CM | POA: Diagnosis not present

## 2012-11-17 DIAGNOSIS — Z8659 Personal history of other mental and behavioral disorders: Secondary | ICD-10-CM | POA: Insufficient documentation

## 2012-11-17 DIAGNOSIS — Z8701 Personal history of pneumonia (recurrent): Secondary | ICD-10-CM | POA: Diagnosis not present

## 2012-11-17 DIAGNOSIS — Z8739 Personal history of other diseases of the musculoskeletal system and connective tissue: Secondary | ICD-10-CM | POA: Insufficient documentation

## 2012-11-17 DIAGNOSIS — Z8719 Personal history of other diseases of the digestive system: Secondary | ICD-10-CM | POA: Diagnosis not present

## 2012-11-17 DIAGNOSIS — M25569 Pain in unspecified knee: Secondary | ICD-10-CM | POA: Diagnosis not present

## 2012-11-17 DIAGNOSIS — Z8679 Personal history of other diseases of the circulatory system: Secondary | ICD-10-CM | POA: Insufficient documentation

## 2012-11-17 DIAGNOSIS — M25469 Effusion, unspecified knee: Secondary | ICD-10-CM | POA: Diagnosis not present

## 2012-11-17 DIAGNOSIS — I10 Essential (primary) hypertension: Secondary | ICD-10-CM | POA: Insufficient documentation

## 2012-11-17 DIAGNOSIS — G8911 Acute pain due to trauma: Secondary | ICD-10-CM | POA: Diagnosis not present

## 2012-11-17 DIAGNOSIS — Z872 Personal history of diseases of the skin and subcutaneous tissue: Secondary | ICD-10-CM | POA: Diagnosis not present

## 2012-11-17 DIAGNOSIS — S99919A Unspecified injury of unspecified ankle, initial encounter: Secondary | ICD-10-CM | POA: Diagnosis not present

## 2012-11-17 DIAGNOSIS — M25461 Effusion, right knee: Secondary | ICD-10-CM

## 2012-11-17 DIAGNOSIS — S99929A Unspecified injury of unspecified foot, initial encounter: Secondary | ICD-10-CM | POA: Diagnosis not present

## 2012-11-17 MED ORDER — NAPROXEN 500 MG PO TABS: 500.0000 mg | ORAL_TABLET | Freq: Two times a day (BID) | ORAL | Status: DC

## 2012-11-17 NOTE — ED Notes (Signed)
Pt ambulatory leaving ED. Pt had knee immobilizer placed by ortho tech. Pt given d/c teaching and prescription upon d/c. Pt has been educated on importance of follow up appointment with orthopedics. Pt verbalizes understanding and has no further questions upon d/c. Pt does not appear to be in acute distress upon d/c.
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
## 2012-11-17 NOTE — ED Notes (Signed)
Paged ortho 

## 2012-11-17 NOTE — ED Notes (Signed)
Ortho tech at bedside 
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
## 2012-11-17 NOTE — Progress Notes (Signed)
Orthopedic Tech Progress Note Patient Details:  Andrew [name] 03-Aug-1966 [id number]  Ortho Devices Type of Ortho Device: Knee Immobilizer Ortho Device/Splint Location: (R) LE Ortho Device/Splint Interventions: Application   Jennye Moccasin 11/17/2012, 10:16 PM

## 2012-11-18 NOTE — ED Provider Notes (Signed)
Medical screening examination/treatment/procedure(s) were performed by non-physician practitioner and as supervising physician I was immediately available for consultation/collaboration.  Christopher J. Pollina, MD 11/18/12 2258 

## 2012-11-24 DIAGNOSIS — M25469 Effusion, unspecified knee: Secondary | ICD-10-CM | POA: Diagnosis not present

## 2012-11-24 DIAGNOSIS — M25569 Pain in unspecified knee: Secondary | ICD-10-CM | POA: Diagnosis not present

## 2012-11-26 ENCOUNTER — Other Ambulatory Visit: Payer: Self-pay | Admitting: Family Medicine

## 2012-11-26 DIAGNOSIS — M25561 Pain in right knee: Secondary | ICD-10-CM

## 2012-12-05 ENCOUNTER — Ambulatory Visit
Admission: RE | Admit: 2012-12-05 | Discharge: 2012-12-05 | Disposition: A | Discharge status: Home / Self Care | Payer: Medicaid Other | Source: Ambulatory Visit | Attending: Family Medicine | Admitting: Family Medicine

## 2012-12-05 DIAGNOSIS — IMO0002 Reserved for concepts with insufficient information to code with codable children: Secondary | ICD-10-CM | POA: Diagnosis not present

## 2012-12-05 DIAGNOSIS — M25561 Pain in right knee: Secondary | ICD-10-CM

## 2012-12-10 DIAGNOSIS — M23329 Other meniscus derangements, posterior horn of medial meniscus, unspecified knee: Secondary | ICD-10-CM | POA: Diagnosis not present

## 2012-12-10 DIAGNOSIS — M25469 Effusion, unspecified knee: Secondary | ICD-10-CM | POA: Diagnosis not present

## 2012-12-10 DIAGNOSIS — M25569 Pain in unspecified knee: Secondary | ICD-10-CM | POA: Diagnosis not present

## 2013-01-11 ENCOUNTER — Encounter (HOSPITAL_COMMUNITY): Payer: Self-pay | Admitting: *Deleted

## 2013-01-11 ENCOUNTER — Emergency Department (HOSPITAL_COMMUNITY)
Admission: EM | Admit: 2013-01-11 | Discharge: 2013-01-11 | Disposition: A | Discharge status: Home / Self Care | Payer: Medicare Other | Attending: Emergency Medicine | Admitting: Emergency Medicine

## 2013-01-11 DIAGNOSIS — Z8659 Personal history of other mental and behavioral disorders: Secondary | ICD-10-CM | POA: Diagnosis not present

## 2013-01-11 DIAGNOSIS — M129 Arthropathy, unspecified: Secondary | ICD-10-CM | POA: Diagnosis not present

## 2013-01-11 DIAGNOSIS — IMO0001 Reserved for inherently not codable concepts without codable children: Secondary | ICD-10-CM | POA: Insufficient documentation

## 2013-01-11 DIAGNOSIS — I10 Essential (primary) hypertension: Secondary | ICD-10-CM | POA: Diagnosis not present

## 2013-01-11 DIAGNOSIS — Z8679 Personal history of other diseases of the circulatory system: Secondary | ICD-10-CM | POA: Insufficient documentation

## 2013-01-11 DIAGNOSIS — M791 Myalgia, unspecified site: Secondary | ICD-10-CM

## 2013-01-11 DIAGNOSIS — Z8701 Personal history of pneumonia (recurrent): Secondary | ICD-10-CM | POA: Diagnosis not present

## 2013-01-11 DIAGNOSIS — IMO0002 Reserved for concepts with insufficient information to code with codable children: Secondary | ICD-10-CM | POA: Insufficient documentation

## 2013-01-11 DIAGNOSIS — F17200 Nicotine dependence, unspecified, uncomplicated: Secondary | ICD-10-CM | POA: Insufficient documentation

## 2013-01-11 DIAGNOSIS — R4182 Altered mental status, unspecified: Secondary | ICD-10-CM | POA: Diagnosis not present

## 2013-01-11 DIAGNOSIS — Z8719 Personal history of other diseases of the digestive system: Secondary | ICD-10-CM | POA: Insufficient documentation

## 2013-01-11 DIAGNOSIS — R51 Headache: Secondary | ICD-10-CM | POA: Diagnosis not present

## 2013-01-11 DIAGNOSIS — R451 Restlessness and agitation: Secondary | ICD-10-CM

## 2013-01-11 DIAGNOSIS — Z872 Personal history of diseases of the skin and subcutaneous tissue: Secondary | ICD-10-CM | POA: Insufficient documentation

## 2013-01-11 LAB — CBC WITH DIFFERENTIAL/PLATELET
Basophils Absolute: 0 10*3/uL (ref 0.0–0.1)
Eosinophils Absolute: 0.2 10*3/uL (ref 0.0–0.7)
Lymphs Abs: 2.5 10*3/uL (ref 0.7–4.0)
MCH: 31 pg (ref 26.0–34.0)
Neutrophils Relative %: 59 % (ref 43–77)
Platelets: 316 10*3/uL (ref 150–400)
RBC: 4.81 MIL/uL (ref 4.22–5.81)
RDW: 12.7 % (ref 11.5–15.5)
WBC: 8.7 10*3/uL (ref 4.0–10.5)

## 2013-01-11 LAB — BASIC METABOLIC PANEL
BUN: 23 mg/dL (ref 6–23)
Creatinine, Ser: 1.13 mg/dL (ref 0.50–1.35)
GFR calc Af Amer: 88 mL/min — ABNORMAL LOW (ref >90)
GFR calc non Af Amer: 76 mL/min — ABNORMAL LOW (ref >90)
Glucose, Bld: 108 mg/dL — ABNORMAL HIGH (ref 70–99)
Potassium: 4.2 mEq/L (ref 3.5–5.1)

## 2013-01-11 MED ORDER — ACETAMINOPHEN 325 MG PO TABS
650.0000 mg | ORAL_TABLET | Freq: Once | ORAL | Status: AC
  Administered 2013-01-11: 650 mg via ORAL
  Filled 2013-01-11: qty 2
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
## 2013-01-11 NOTE — ED Notes (Signed)
Pt attempting to call for ride at present

## 2013-01-11 NOTE — ED Notes (Signed)
Patient allowed blood to be drawn but does not want to be touched, refusing meds.  Patient very jumpy and jerking arms.  Warm blanket applied.  When left alone, patient resting with eyes closed and arms crossed.

## 2013-01-11 NOTE — ED Notes (Signed)
Continues to jerk

## 2013-01-11 NOTE — ED Notes (Signed)
Discharge instructions given to prior nurse. Patient request 1-2 [place] sandwich bags to go. Given two to patient.

## 2013-01-11 NOTE — ED Notes (Signed)
Patient found outside the apartment building  Would not answer questions for EMS and would not allow them to do anything.

## 2013-02-12 ENCOUNTER — Emergency Department (HOSPITAL_COMMUNITY)
Admission: EM | Admit: 2013-02-12 | Discharge: 2013-02-14 | Disposition: A | Discharge status: Home / Self Care | Payer: Medicare Other | Attending: Emergency Medicine | Admitting: Emergency Medicine

## 2013-02-12 DIAGNOSIS — Z8739 Personal history of other diseases of the musculoskeletal system and connective tissue: Secondary | ICD-10-CM | POA: Insufficient documentation

## 2013-02-12 DIAGNOSIS — Z8659 Personal history of other mental and behavioral disorders: Secondary | ICD-10-CM | POA: Insufficient documentation

## 2013-02-12 DIAGNOSIS — Z8679 Personal history of other diseases of the circulatory system: Secondary | ICD-10-CM | POA: Insufficient documentation

## 2013-02-12 DIAGNOSIS — Z8701 Personal history of pneumonia (recurrent): Secondary | ICD-10-CM | POA: Insufficient documentation

## 2013-02-12 DIAGNOSIS — F1910 Other psychoactive substance abuse, uncomplicated: Secondary | ICD-10-CM | POA: Insufficient documentation

## 2013-02-12 DIAGNOSIS — Z8719 Personal history of other diseases of the digestive system: Secondary | ICD-10-CM | POA: Insufficient documentation

## 2013-02-12 DIAGNOSIS — F17200 Nicotine dependence, unspecified, uncomplicated: Secondary | ICD-10-CM | POA: Insufficient documentation

## 2013-02-12 DIAGNOSIS — I10 Essential (primary) hypertension: Secondary | ICD-10-CM | POA: Insufficient documentation

## 2013-02-12 DIAGNOSIS — Z872 Personal history of diseases of the skin and subcutaneous tissue: Secondary | ICD-10-CM | POA: Insufficient documentation

## 2013-02-12 DIAGNOSIS — R45851 Suicidal ideations: Secondary | ICD-10-CM | POA: Insufficient documentation

## 2013-02-13 ENCOUNTER — Encounter (HOSPITAL_COMMUNITY): Payer: Self-pay | Admitting: Emergency Medicine

## 2013-02-13 LAB — CBC
HCT: 41.5 % (ref 39.0–52.0)
MCH: 30.7 pg (ref 26.0–34.0)
MCV: 89.6 fL (ref 78.0–100.0)
Platelets: 250 10*3/uL (ref 150–400)
RBC: 4.63 MIL/uL (ref 4.22–5.81)
RDW: 13 % (ref 11.5–15.5)
WBC: 5.9 10*3/uL (ref 4.0–10.5)

## 2013-02-13 LAB — ETHANOL: Alcohol, Ethyl (B): <11 mg/dL (ref 0–11)

## 2013-02-13 LAB — RAPID URINE DRUG SCREEN, HOSP PERFORMED: Opiates: NOT DETECTED

## 2013-02-13 LAB — COMPREHENSIVE METABOLIC PANEL
BUN: 12 mg/dL (ref 6–23)
CO2: 26 mEq/L (ref 19–32)
Calcium: 9 mg/dL (ref 8.4–10.5)
Chloride: 102 mEq/L (ref 96–112)
Creatinine, Ser: 0.8 mg/dL (ref 0.50–1.35)
GFR calc non Af Amer: >90 mL/min (ref >90)
Total Bilirubin: 0.2 mg/dL — ABNORMAL LOW (ref 0.3–1.2)

## 2013-02-13 LAB — SALICYLATE LEVEL: Salicylate Lvl: <2 mg/dL — ABNORMAL LOW (ref 2.8–20.0)

## 2013-02-13 MED ORDER — ONDANSETRON HCL 4 MG PO TABS: 4.0000 mg | ORAL_TABLET | Freq: Three times a day (TID) | ORAL | Status: DC | PRN

## 2013-02-13 MED ORDER — ZOLPIDEM TARTRATE 5 MG PO TABS
5.0000 mg | ORAL_TABLET | Freq: Every evening | ORAL | Status: DC | PRN
  Administered 2013-02-13: 5 mg via ORAL
  Filled 2013-02-13: qty 1

## 2013-02-13 MED ORDER — LORAZEPAM 1 MG PO TABS
1.0000 mg | ORAL_TABLET | Freq: Three times a day (TID) | ORAL | Status: DC | PRN
  Administered 2013-02-13: 1 mg via ORAL
  Filled 2013-02-13: qty 1

## 2013-02-13 MED ORDER — ACETAMINOPHEN 325 MG PO TABS
650.0000 mg | ORAL_TABLET | ORAL | Status: DC | PRN
  Administered 2013-02-13: 650 mg via ORAL
  Filled 2013-02-13: qty 2

## 2013-02-13 MED ORDER — IBUPROFEN 600 MG PO TABS
600.0000 mg | ORAL_TABLET | Freq: Three times a day (TID) | ORAL | Status: DC | PRN
  Administered 2013-02-13: 600 mg via ORAL
  Filled 2013-02-13: qty 1

## 2013-02-13 MED ORDER — ALUM & MAG HYDROXIDE-SIMETH 200-200-20 MG/5ML PO SUSP: 30.0000 mL | ORAL | Status: DC | PRN

## 2013-02-13 NOTE — ED Notes (Signed)
Pt kept falling asleep during the assessment. He wouldn't elaborate about his recent stressors leading to him being suicidal. He's been to [facility] before

## 2013-02-13 NOTE — ED Notes (Signed)
Up to the bathroom 

## 2013-02-13 NOTE — ED Notes (Signed)
To the desk on the phone 

## 2013-02-13 NOTE — ED Provider Notes (Signed)
History     CSN: [id number]  Arrival date & time 02/12/13  2356   First MD Initiated Contact with Patient 02/13/13 0033      Chief Complaint  Patient presents with  . Medical Clearance    (Consider location/radiation/quality/duration/timing/severity/associated sxs/prior treatment) HPI Comments: Patient with a history of Bipolar Disorder and Schizoaffective Disorder presents today voluntarily due to have suicidal thoughts.  Patient reports that he began having these thoughts yesterday.  He cut his right forearm in an attempt to kill himself.  He has abrasions to his right forearm at this time.  He denies HI.  He reports that he drank alcohol earlier today, but does not regularly drink alcohol.  He denies recreational drug use. He denies any physical symptoms at this time.  His tetanus is UTD.    The history is provided by the patient.    Past Medical History  Diagnosis Date  . Bipolar disorder   . Eczema   . Low back pain   . Elevated blood pressure reading without diagnosis of hypertension   . Abdominal wall hernia   . Poor circulation   . Arthritis   . Hypertension   . Anginal pain   . Pneumonia ~ 10/2011  . Umbilical hernia     "needs to be repaired"  . Generalized headaches     "just about every day"  . Anxiety   . Depression   . Schizoaffective disorder     Past Surgical History  Procedure Laterality Date  . Lipoma excision  ~ 2010    "back left side of my head"    Family History  Problem Relation Age of Onset  . Hypertension Mother   . Cancer Mother   . Heart disease Father   . Heart attack Father 20  . Hyperlipidemia Father   . Hypertension Father   . Diabetes Sister   . Diabetes Brother     History  Substance Use Topics  . Smoking status: Current Some Day Smoker -- 1.00 packs/day for 30 years    Types: Cigarettes  . Smokeless tobacco: Never Used  . Alcohol Use: No     Comment: 03/31/12 "2 bottles of wine/month; shot of liquor couple times/month       Review of Systems  Psychiatric/Behavioral: Positive for suicidal ideas, self-injury and dysphoric mood.  All other systems reviewed and are negative.    Allergies  Penicillins  Home Medications  No current outpatient prescriptions on file.  BP 139/76  Pulse 85  Temp(Src) 98.7 F (37.1 C) (Oral)  Resp 18  Wt 200 lb (90.719 kg)  BMI 29.96 kg/m2  SpO2 98%  Physical Exam  Nursing note and vitals reviewed. Constitutional: He appears well-developed and well-nourished.  HENT:  Head: Normocephalic and atraumatic.  Eyes: EOM are normal. Pupils are equal, round, and reactive to light.  Neck: Normal range of motion. Neck supple.  Cardiovascular: Normal rate, regular rhythm and normal heart sounds.   Pulmonary/Chest: Effort normal and breath sounds normal.  Neurological: He is alert.  Skin: Skin is warm and dry.  Superficial abrasions to right forearm    Psychiatric: His speech is normal. His affect is angry. He is agitated. He is not actively hallucinating. He expresses suicidal ideation. He expresses no homicidal ideation. He expresses suicidal plans. He expresses no homicidal plans.    ED Course  Procedures (including critical care time)  Labs Reviewed  ACETAMINOPHEN LEVEL  CBC  COMPREHENSIVE METABOLIC PANEL  ETHANOL  SALICYLATE LEVEL  URINE RAPID DRUG SCREEN (HOSP PERFORMED)   No results found.   No diagnosis found.    MDM  Patient presenting with suicidal thoughts.  He reports that he is trying to come up with a plan that would work.  Patient appears agitated.  Psych holding orders placed.  ACT team consulted.        Pascal Lux Graceton, PA-C 02/13/13 0054  Magnus Sinning, PA-C 02/13/13 [phone]

## 2013-02-13 NOTE — ED Provider Notes (Signed)
Medical screening examination/treatment/procedure(s) were performed by non-physician practitioner and as supervising physician I was immediately available for consultation/collaboration.   Joya Gaskins, MD 02/13/13 [phone]
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
## 2013-02-13 NOTE — ED Provider Notes (Signed)
Andrew [name] S 1:00 AM patient discussed in sign out with Benn Moulder PAC. Patient with suicidal ideations. Labs pending for medical clearance. Will continue to follow labs sure patient is medically cleared.  Labs unremarkable. Patient to be evaluated by act team.   Angus Seller, PA-C 02/13/13 0321

## 2013-02-13 NOTE — ED Notes (Signed)
ACT  Into see

## 2013-02-13 NOTE — BH Assessment (Signed)
Assessment Note   Andrew [name] is an 47 y.o. male with a history of Bipolar Disorder and Schizoaffective Disorder presents today voluntarily due to suicidal thoughts. Patient's male friend that he lives with brought him here to [facility] for a evaluation. Patient reports that he began having these thoughts yesterday. He attempted to cut his right forearm in an attempt to kill himself. However, his friend stopped him.  He has abrasions to his right forearm at this time from another recent episode of trying to self mutilate himself. He continues to endorse plans of how he will harm himself by jumping off a 2 story building or stabbing himself in the chest. Patient has a prior history of multiple suicide attempts by overdosing.    He denies HI.   He reports auditory hallucinations of voices telling him to kill himself and how to kill himself. He also reports visual hallucinations of people in clouds waving at him. Says that one of the people in the clouds was his deceased mother.    He reports that he drank alcohol earlier today, but does not regularly drink alcohol. Prior to drinking yesterday he drank 2 weeks ago. He also reports heavy Heroin use. Sts he was using every day for the past year $200 worth and his last use was 2 weeks ago. He also using crack cocaine daily for the past year at $40 per use. He last used crack cocaine yesterday.   Patient has a psychiatrist at [facility] however he has not followed up in over a year. He also been non-compliant with medications in the past year. He is not able to provide the names of the medications that the psychiatrist at [facility] was prescribing him. Patient has received inpatient treatment at [facility] 1x in the past but he is unable recall the date of his hospitalization.   Pt referred to [facility] for inpatient hospitalization.   Axis I: Substance Induced Mood Disorder; Major Depressive Disorder, Recurrent, Severe with Psychotic Features Axis II: Deferred Axis III:   Past Medical History  Diagnosis Date  . Bipolar disorder   . Eczema   . Low back pain   . Elevated blood pressure reading without diagnosis of hypertension   . Abdominal wall hernia   . Poor circulation   . Arthritis   . Hypertension   . Anginal pain   . Pneumonia ~ 10/2011  . Umbilical hernia     "needs to be repaired"  . Generalized headaches     "just about every day"  . Anxiety   . Depression   . Schizoaffective disorder    Axis IV: other psychosocial or environmental problems, problems related to social environment, problems with access to health care services and problems with primary support group Axis V: 31-40 impairment in reality testing  Past Medical History:  Past Medical History  Diagnosis Date  . Bipolar disorder   . Eczema   . Low back pain   . Elevated blood pressure reading without diagnosis of hypertension   . Abdominal wall hernia   . Poor circulation   . Arthritis   . Hypertension   . Anginal pain   . Pneumonia ~ 10/2011  . Umbilical hernia     "needs to be repaired"  . Generalized headaches     "just about every day"  . Anxiety   . Depression   . Schizoaffective disorder     Past Surgical History  Procedure Laterality Date  . Lipoma excision  ~ 2010    "  back left side of my head"    Family History:  Family History  Problem Relation Age of Onset  . Hypertension Mother   . Cancer Mother   . Heart disease Father   . Heart attack Father 69  . Hyperlipidemia Father   . Hypertension Father   . Diabetes Sister   . Diabetes Brother     Social History:  reports that he has been smoking Cigarettes.  He has a 30 pack-year smoking history. He has never used smokeless tobacco. He reports that he uses illicit drugs (Cocaine and Marijuana). He reports that he does not drink alcohol.  Additional Social History:  Alcohol / Drug Use Pain Medications: SEE MAR Prescriptions: SEE MAR Over the Counter: SEE MAR History of alcohol / drug use?:  Yes Substance #1 Name of Substance 1: Crack Cocaine (snorting) 1 - Age of First Use: 47 yrs old  1 - Amount (size/oz): 2x's per week  1 - Frequency: $40 1 - Duration: on-going for the past year 1 - Last Use / Amount: 02/12/2013; $40 worth of cocaine Substance #2 Name of Substance 2: Heroin (snorting) 2 - Age of First Use: 47 yrs old  2 - Amount (size/oz): $200 per day 2 - Frequency: daily  2 - Duration: on-going for the past year 2 - Last Use / Amount: 2 weeks ago Substance #3 Name of Substance 3: Alcohol  3 - Age of First Use: teens  3 - Amount (size/oz): "very little" 3 - Frequency: "I rarely drink"; "Occasionally" 3 - Duration: 2 weeks ago  CIWA: CIWA-Ar BP: 113/70 mmHg Pulse Rate: 87 COWS:    Allergies:  Allergies  Allergen Reactions  . Penicillins Hives    Home Medications:  (Not in a hospital admission)  OB/GYN Status:  No LMP for male patient.  General Assessment Data Location of Assessment: [facility] Living Arrangements: Non-relatives/Friends (lives with a friend) Can pt return to current living arrangement?: Yes Admission Status: Voluntary Is patient capable of signing voluntary admission?: Yes Transfer from: Acute Hospital Referral Source: Self/Family/Friend     Risk to self Suicidal Ideation: Yes-Currently Present Suicidal Intent: Yes-Currently Present Is patient at risk for suicide?: Yes Suicidal Plan?: Yes-Currently Present Specify Current Suicidal Plan:  (jump out of a 2 story buiilding; cut self with knife) Access to Means: Yes Specify Access to Suicidal Means:  (access to buildings and sharp objects) What has been your use of drugs/alcohol within the last 12 months?:  (patient reports Heroin and Cocaine use; occas. alcohol use) Previous Attempts/Gestures: Yes How many times?:  (multiple x's-overdoses and cutting) Other Self Harm Risks:  (history of cutting) Triggers for Past Attempts: Other (Comment) (AVH's; "The voices told me to do  it") Intentional Self Injurious Behavior: None Family Suicide History: Unknown Recent stressful life event(s): Other (Comment) ("Can't find a job", relationship problems) Persecutory voices/beliefs?: No Depression: Yes Depression Symptoms: Despondent;Feeling angry/irritable;Feeling worthless/self pity;Loss of interest in usual pleasures;Guilt;Fatigue;Isolating;Insomnia Substance abuse history and/or treatment for substance abuse?: No Suicide prevention information given to non-admitted patients: Not applicable  Risk to Others Homicidal Ideation: No Thoughts of Harm to Others: No Current Homicidal Intent: No Current Homicidal Plan: No Access to Homicidal Means: No Identified Victim:  (n/a) History of harm to others?: No Assessment of Violence: None Noted Violent Behavior Description:  (patient currently calm and cooperative) Does patient have access to weapons?: No Criminal Charges Pending?: No Does patient have a court date: No  Psychosis Hallucinations: Auditory;Visual;With command (Aud-"Voices tell me to kill  myself and ways to do it") Delusions: Unspecified (Visual-people waving at me in clouds)  Mental Status Report Appear/Hygiene: Disheveled Eye Contact: Fair Motor Activity: Freedom of movement Speech: Logical/coherent Level of Consciousness: Alert Mood: Preoccupied;Apprehensive Affect: Appropriate to circumstance Anxiety Level: None Thought Processes: Coherent;Relevant Judgement: Impaired Orientation: Person;Place;Time;Situation Obsessive Compulsive Thoughts/Behaviors: None  Cognitive Functioning Concentration: Decreased Memory: Recent Intact;Remote Intact IQ: Average Insight: Poor Impulse Control: Poor Appetite: Poor Weight Loss:  (none reported) Weight Gain:  (none reported) Sleep: Decreased Total Hours of Sleep:  (2-3 hours per night) Vegetative Symptoms: None  ADLScreening [facility] Assessment [facility]) Patient's cognitive ability adequate to safely  complete daily activities?: Yes Patient able to express need for assistance with ADLs?: Yes Independently performs ADLs?: Yes (appropriate for developmental age)  Abuse/Neglect [facility]) Physical Abuse: Denies Verbal Abuse: Denies Sexual Abuse: Denies  Prior Inpatient Therapy Prior Inpatient Therapy: Yes Prior Therapy Dates:  (patient unable to recall) Prior Therapy Facilty/Provider(s):  [facility] ) Reason for Treatment:  (AVH's, suiciddal , substance abuse)  Prior Outpatient Therapy Prior Outpatient Therapy: Yes Prior Therapy Dates:  (past; last appointment was over 1 yr ago) Prior Therapy Facilty/Provider(s):  [facility]) Reason for Treatment:  (medication management)  ADL Screening (condition at time of admission) Patient's cognitive ability adequate to safely complete daily activities?: Yes Patient able to express need for assistance with ADLs?: Yes Independently performs ADLs?: Yes (appropriate for developmental age) Weakness of Legs: None Weakness of Arms/Hands: None  Home Assistive Devices/Equipment Home Assistive Devices/Equipment: None    Abuse/Neglect Assessment (Assessment to be complete while patient is alone) Physical Abuse: Denies Verbal Abuse: Denies Sexual Abuse: Denies Exploitation of patient/patient's resources: Denies Self-Neglect: Denies Values / Beliefs Cultural Requests During Hospitalization: None Spiritual Requests During Hospitalization: None   Advance Directives (For Healthcare) Advance Directive: Patient does not have advance directive Nutrition Screen- [facility] Patient's home diet: Regular  Additional Information 1:1 In Past 12 Months?: No CIRT Risk: No Elopement Risk: No Does patient have medical clearance?: Yes     Disposition:  Disposition Initial Assessment Completed for this Encounter: Yes Disposition of Patient: Inpatient treatment program Type of inpatient treatment program: Adult  On Site Evaluation by:   Reviewed with  Physician:     Octaviano Batty 02/13/2013 1:58 PM

## 2013-02-13 NOTE — ED Provider Notes (Signed)
7:43 AM pt seen in psych ED, pt resting comfortably without current complaints.  No reports of issues overnight from nursing. Pt is awaiting ACT team evaluation.   Ethelda Chick, MD 02/13/13 [phone]

## 2013-02-13 NOTE — ED Notes (Signed)
Pt up to bathroom.

## 2013-02-13 NOTE — ED Provider Notes (Signed)
Medical screening examination/treatment/procedure(s) were performed by non-physician practitioner and as supervising physician I was immediately available for consultation/collaboration.   Joya Gaskins, MD 02/13/13 [phone]

## 2013-02-14 NOTE — ED Notes (Addendum)
Patient resting in bed; no s/s of distress noted. Pt declining to talk about why he is depressed stating "I don't want to talk about that while I'm here; I just want to rest". Pt states he is suicidal but verbally contracts for safety; denies plan.

## 2013-02-14 NOTE — ED Notes (Signed)
Up to the bathroom 

## 2013-02-14 NOTE — ED Provider Notes (Signed)
Patient cleared for D/C by psych.  Gerhard Munch, MD 02/14/13 2101

## 2013-02-14 NOTE — ED Notes (Signed)
telepsych info faxed and called 

## 2013-02-14 NOTE — ED Provider Notes (Signed)
No new nursing issues. Placement pending  Toy Baker, MD 02/14/13 [phone]

## 2013-02-14 NOTE — ED Notes (Signed)
Sitting at table eatting sandwich

## 2013-02-25 ENCOUNTER — Emergency Department (HOSPITAL_COMMUNITY): Payer: Medicare Other

## 2013-02-25 ENCOUNTER — Emergency Department (HOSPITAL_COMMUNITY)
Admission: EM | Admit: 2013-02-25 | Discharge: 2013-02-25 | Disposition: A | Discharge status: Home / Self Care | Payer: Medicare Other | Attending: Emergency Medicine | Admitting: Emergency Medicine

## 2013-02-25 ENCOUNTER — Encounter (HOSPITAL_COMMUNITY): Payer: Self-pay | Admitting: Emergency Medicine

## 2013-02-25 DIAGNOSIS — Z8739 Personal history of other diseases of the musculoskeletal system and connective tissue: Secondary | ICD-10-CM | POA: Insufficient documentation

## 2013-02-25 DIAGNOSIS — S99919A Unspecified injury of unspecified ankle, initial encounter: Secondary | ICD-10-CM | POA: Diagnosis not present

## 2013-02-25 DIAGNOSIS — M25469 Effusion, unspecified knee: Secondary | ICD-10-CM | POA: Insufficient documentation

## 2013-02-25 DIAGNOSIS — Y9301 Activity, walking, marching and hiking: Secondary | ICD-10-CM | POA: Insufficient documentation

## 2013-02-25 DIAGNOSIS — Z8719 Personal history of other diseases of the digestive system: Secondary | ICD-10-CM | POA: Insufficient documentation

## 2013-02-25 DIAGNOSIS — Z8679 Personal history of other diseases of the circulatory system: Secondary | ICD-10-CM | POA: Insufficient documentation

## 2013-02-25 DIAGNOSIS — S8990XA Unspecified injury of unspecified lower leg, initial encounter: Secondary | ICD-10-CM | POA: Diagnosis not present

## 2013-02-25 DIAGNOSIS — Z8659 Personal history of other mental and behavioral disorders: Secondary | ICD-10-CM | POA: Insufficient documentation

## 2013-02-25 DIAGNOSIS — S99929A Unspecified injury of unspecified foot, initial encounter: Secondary | ICD-10-CM | POA: Insufficient documentation

## 2013-02-25 DIAGNOSIS — F17200 Nicotine dependence, unspecified, uncomplicated: Secondary | ICD-10-CM | POA: Diagnosis not present

## 2013-02-25 DIAGNOSIS — Z87828 Personal history of other (healed) physical injury and trauma: Secondary | ICD-10-CM | POA: Diagnosis not present

## 2013-02-25 DIAGNOSIS — Z880 Allergy status to penicillin: Secondary | ICD-10-CM | POA: Insufficient documentation

## 2013-02-25 DIAGNOSIS — I10 Essential (primary) hypertension: Secondary | ICD-10-CM | POA: Diagnosis not present

## 2013-02-25 DIAGNOSIS — W010XXA Fall on same level from slipping, tripping and stumbling without subsequent striking against object, initial encounter: Secondary | ICD-10-CM | POA: Insufficient documentation

## 2013-02-25 DIAGNOSIS — Z872 Personal history of diseases of the skin and subcutaneous tissue: Secondary | ICD-10-CM | POA: Diagnosis not present

## 2013-02-25 DIAGNOSIS — Y929 Unspecified place or not applicable: Secondary | ICD-10-CM | POA: Insufficient documentation

## 2013-02-25 DIAGNOSIS — Z8701 Personal history of pneumonia (recurrent): Secondary | ICD-10-CM | POA: Diagnosis not present

## 2013-02-25 DIAGNOSIS — M25461 Effusion, right knee: Secondary | ICD-10-CM

## 2013-02-25 MED ORDER — OXYCODONE-ACETAMINOPHEN 5-325 MG PO TABS
1.0000 | ORAL_TABLET | Freq: Once | ORAL | Status: AC
  Administered 2013-02-25: 1 via ORAL
  Filled 2013-02-25: qty 1

## 2013-02-25 MED ORDER — OXYCODONE-ACETAMINOPHEN 5-325 MG PO TABS: 1.0000 | ORAL_TABLET | Freq: Four times a day (QID) | ORAL | Status: DC | PRN

## 2013-02-25 NOTE — ED Provider Notes (Addendum)
History     CSN: [id number]  Arrival date & time 02/25/13  4098   First MD Initiated Contact with Patient 02/25/13 [phone]      Chief Complaint  Patient presents with  . Fall    (Consider location/radiation/quality/duration/timing/severity/associated sxs/prior treatment) Patient is a 47 y.o. male presenting with knee pain. The history is provided by the patient.  Knee Pain Location:  Knee Time since incident:  8 hours Injury: yes   Mechanism of injury: fall   Fall:    Fall occurred:  Tripped and walking (walking to the bathroom in the middle of the night and tripped over a dog toy and heard a pop in the knee)   Impact surface:  [employer] of impact:  Knees Knee location:  R knee Pain details:    Quality:  Shooting, throbbing and sharp   Radiates to:  Does not radiate   Severity:  Moderate   Onset quality:  Sudden   Timing:  Constant   Progression:  Unchanged Chronicity: known prior ligamentous injury in the knee. Prior injury to area:  Yes Relieved by:  Nothing Worsened by:  Bearing weight Ineffective treatments:  None tried Associated symptoms: stiffness and swelling   Associated symptoms: no back pain, no fever, no muscle weakness and no tingling   Risk factors: no known bone disorder     Past Medical History  Diagnosis Date  . Bipolar disorder   . Eczema   . Low back pain   . Elevated blood pressure reading without diagnosis of hypertension   . Abdominal wall hernia   . Poor circulation   . Arthritis   . Hypertension   . Anginal pain   . Pneumonia ~ 10/2011  . Umbilical hernia     "needs to be repaired"  . Generalized headaches     "just about every day"  . Anxiety   . Depression   . Schizoaffective disorder     Past Surgical History  Procedure Laterality Date  . Lipoma excision  ~ 2010    "back left side of my head"    Family History  Problem Relation Age of Onset  . Hypertension Mother   . Cancer Mother   . Heart disease Father   . Heart  attack Father 18  . Hyperlipidemia Father   . Hypertension Father   . Diabetes Sister   . Diabetes Brother     History  Substance Use Topics  . Smoking status: Current Some Day Smoker -- 1.00 packs/day for 30 years    Types: Cigarettes  . Smokeless tobacco: Never Used  . Alcohol Use: No     Comment: 03/31/12 "2 bottles of wine/month; shot of liquor couple times/month      Review of Systems  Constitutional: Negative for fever.  Musculoskeletal: Positive for stiffness. Negative for back pain.  All other systems reviewed and are negative.    Allergies  Penicillins  Home Medications  No current outpatient prescriptions on file.  There were no vitals taken for this visit.  Physical Exam  Nursing note and vitals reviewed. Constitutional: He is oriented to person, place, and time. He appears well-developed and well-nourished. No distress.  HENT:  Head: Normocephalic and atraumatic.  Mouth/Throat: Oropharynx is clear and moist.  Eyes: Conjunctivae and EOM are normal. Pupils are equal, round, and reactive to light.  Cardiovascular: Normal rate and intact distal pulses.   Pulmonary/Chest: Effort normal.  Musculoskeletal: Normal range of motion. He exhibits tenderness. He exhibits  no edema.       Right knee: He exhibits swelling and effusion. He exhibits no deformity, no erythema, no LCL laxity and no MCL laxity. Tenderness found. Medial joint line and MCL tenderness noted.       Legs: Neurological: He is alert and oriented to person, place, and time.  Skin: Skin is warm and dry. No rash noted. No erythema.  Psychiatric: He has a normal mood and affect. His behavior is normal.    ED Course  Procedures (including critical care time)  Labs Reviewed - No data to display Dg Knee Complete 4 Views Right  02/25/2013   *RADIOLOGY REPORT*  Clinical Data: Pain post trauma  RIGHT KNEE - COMPLETE 4+ VIEW  Comparison:  November 17, 2012  Findings:  Frontal, lateral, bilateral oblique  views were obtained. There is no fracture or dislocation.  There is a small joint effusion.  There is persistent spurring in the patellofemoral joint region.  Joint spaces are normal except for mild patellofemoral joint space narrowing, stable.  There is an unfused apophysis along the posterior proximal fibula, stable.  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 Small joint effusion.  No fracture or dislocation.   Original Report Authenticated By: Bretta Bang, M.D.     1. Knee effusion, right       MDM   Patient with a history of known ligamentous injury of his right knee who had a mechanical fall last night in knee pain in the knee. Appreciable swelling in the medial aspect of the knee without any discernible ligamentous laxity I exam however due to pain he guards when trying to range the knee. Neurovascularly intact without any femoral tenderness and no pain at the ankle. Plain of the knee pending   8:48 AM Small joint effusion but no other acute findings.  Will have f/u with ortho and pt already has a knee brace.     Gwyneth Sprout, MD 02/25/13 7829  Gwyneth Sprout, MD 02/25/13 5621

## 2013-02-25 NOTE — ED Notes (Signed)
Fell last night and hurt his  Rt leg states that he alsready has brace on that leg from before

## 2013-02-28 ENCOUNTER — Emergency Department (HOSPITAL_COMMUNITY)
Admission: EM | Admit: 2013-02-28 | Discharge: 2013-02-28 | Disposition: A | Discharge status: Home / Self Care | Payer: Medicare Other | Attending: Emergency Medicine | Admitting: Emergency Medicine

## 2013-02-28 ENCOUNTER — Emergency Department (HOSPITAL_COMMUNITY): Admission: EM | Admit: 2013-02-28 | Payer: Medicaid Other | Source: Home / Self Care

## 2013-02-28 ENCOUNTER — Encounter (HOSPITAL_COMMUNITY): Payer: Self-pay | Admitting: Emergency Medicine

## 2013-02-28 DIAGNOSIS — F17200 Nicotine dependence, unspecified, uncomplicated: Secondary | ICD-10-CM | POA: Insufficient documentation

## 2013-02-28 DIAGNOSIS — Z872 Personal history of diseases of the skin and subcutaneous tissue: Secondary | ICD-10-CM | POA: Insufficient documentation

## 2013-02-28 DIAGNOSIS — F1420 Cocaine dependence, uncomplicated: Secondary | ICD-10-CM | POA: Insufficient documentation

## 2013-02-28 DIAGNOSIS — F1910 Other psychoactive substance abuse, uncomplicated: Secondary | ICD-10-CM

## 2013-02-28 DIAGNOSIS — R45851 Suicidal ideations: Secondary | ICD-10-CM | POA: Insufficient documentation

## 2013-02-28 DIAGNOSIS — M129 Arthropathy, unspecified: Secondary | ICD-10-CM | POA: Insufficient documentation

## 2013-02-28 DIAGNOSIS — Z8701 Personal history of pneumonia (recurrent): Secondary | ICD-10-CM | POA: Insufficient documentation

## 2013-02-28 DIAGNOSIS — Z8719 Personal history of other diseases of the digestive system: Secondary | ICD-10-CM | POA: Insufficient documentation

## 2013-02-28 DIAGNOSIS — Z8679 Personal history of other diseases of the circulatory system: Secondary | ICD-10-CM | POA: Insufficient documentation

## 2013-02-28 DIAGNOSIS — Z8659 Personal history of other mental and behavioral disorders: Secondary | ICD-10-CM | POA: Insufficient documentation

## 2013-02-28 DIAGNOSIS — F1110 Opioid abuse, uncomplicated: Secondary | ICD-10-CM | POA: Insufficient documentation

## 2013-02-28 DIAGNOSIS — Z880 Allergy status to penicillin: Secondary | ICD-10-CM | POA: Insufficient documentation

## 2013-02-28 LAB — COMPREHENSIVE METABOLIC PANEL
AST: 17 U/L (ref 0–37)
BUN: 17 mg/dL (ref 6–23)
CO2: 27 mEq/L (ref 19–32)
Calcium: 9.3 mg/dL (ref 8.4–10.5)
Creatinine, Ser: 0.98 mg/dL (ref 0.50–1.35)
GFR calc Af Amer: >90 mL/min (ref >90)
GFR calc non Af Amer: >90 mL/min (ref >90)
Glucose, Bld: 150 mg/dL — ABNORMAL HIGH (ref 70–99)

## 2013-02-28 LAB — CBC WITH DIFFERENTIAL/PLATELET
Basophils Absolute: 0 10*3/uL (ref 0.0–0.1)
Eosinophils Relative: 3 % (ref 0–5)
HCT: 42.4 % (ref 39.0–52.0)
Lymphocytes Relative: 27 % (ref 12–46)
MCHC: 35.4 g/dL (ref 30.0–36.0)
MCV: 89.6 fL (ref 78.0–100.0)
Monocytes Absolute: 0.7 10*3/uL (ref 0.1–1.0)
RDW: 13 % (ref 11.5–15.5)
WBC: 8.8 10*3/uL (ref 4.0–10.5)

## 2013-02-28 MED ORDER — ACETAMINOPHEN 325 MG PO TABS: 650.0000 mg | ORAL_TABLET | ORAL | Status: DC | PRN

## 2013-02-28 MED ORDER — ZOLPIDEM TARTRATE 5 MG PO TABS: 5.0000 mg | ORAL_TABLET | Freq: Every evening | ORAL | Status: DC | PRN

## 2013-02-28 MED ORDER — LORAZEPAM 1 MG PO TABS: 1.0000 mg | ORAL_TABLET | Freq: Three times a day (TID) | ORAL | Status: DC | PRN

## 2013-02-28 MED ORDER — ONDANSETRON HCL 4 MG PO TABS: 4.0000 mg | ORAL_TABLET | Freq: Three times a day (TID) | ORAL | Status: DC | PRN

## 2013-02-28 MED ORDER — IBUPROFEN 400 MG PO TABS: 600.0000 mg | ORAL_TABLET | Freq: Three times a day (TID) | ORAL | Status: DC | PRN

## 2013-02-28 NOTE — ED Notes (Signed)
Pt given paper scrubs, sox and pt belonging bags for his person belonging. Pt told tech if she would leave he could change.

## 2013-02-28 NOTE — ED Notes (Signed)
PT. REQUESTING DETOX FOR COCAINE ABUSE , LAST COCAINE THIS EVENING , DENIES PAIN OR DISCOMFORT.

## 2013-02-28 NOTE — ED Notes (Signed)
Patient indicating at this time that he was having suicidal thoughts this evening, no plan at this time.  Patient states that he is SI, not HI.  Patient wanting help for his addiction to Crack cocaine, last time he had some was earlier this evening.

## 2013-02-28 NOTE — ED Notes (Signed)
Telepsych conference performed.

## 2013-02-28 NOTE — ED Provider Notes (Addendum)
History     CSN: [id number]  Arrival date & time 02/28/13  0116   First MD Initiated Contact with Patient 02/28/13 0134      Chief Complaint  Patient presents with  . Addiction Problem  . Suicidal     The history is provided by the patient.   patient reports a history of cocaine abuse with occasional heroin abuse.  His last use cocaine this evening.  He states this makes him sad and he wants to quit using cocaine.  He has had vague suicidal thoughts tonight without a significant plan.  He reports a history of schizoaffective disorder currently not on any medications.  Past Medical History  Diagnosis Date  . Bipolar disorder   . Eczema   . Low back pain   . Elevated blood pressure reading without diagnosis of hypertension   . Abdominal wall hernia   . Poor circulation   . Arthritis   . Hypertension   . Anginal pain   . Pneumonia ~ 10/2011  . Umbilical hernia     "needs to be repaired"  . Generalized headaches     "just about every day"  . Anxiety   . Depression   . Schizoaffective disorder     Past Surgical History  Procedure Laterality Date  . Lipoma excision  ~ 2010    "back left side of my head"    Family History  Problem Relation Age of Onset  . Hypertension Mother   . Cancer Mother   . Heart disease Father   . Heart attack Father 19  . Hyperlipidemia Father   . Hypertension Father   . Diabetes Sister   . Diabetes Brother     History  Substance Use Topics  . Smoking status: Current Some Day Smoker -- 1.00 packs/day for 30 years    Types: Cigarettes  . Smokeless tobacco: Never Used  . Alcohol Use: No     Comment: 03/31/12 "2 bottles of wine/month; shot of liquor couple times/month      Review of Systems  All other systems reviewed and are negative.    Allergies  Penicillins  Home Medications   Current Outpatient Rx  Name  Route  Sig  Dispense  Refill  . nabumetone (RELAFEN) 750 MG tablet   Oral   Take 750 mg by mouth 2 (two) times  daily.         Marland Kitchen oxyCODONE-acetaminophen (PERCOCET/ROXICET) 5-325 MG per tablet   Oral   Take 1-2 tablets by mouth every 6 (six) hours as needed for pain.   15 tablet   0     BP 117/75  Pulse 80  Temp(Src) 98 F (36.7 C) (Oral)  Resp 16  SpO2 97%  Physical Exam  Nursing note and vitals reviewed. Constitutional: He is oriented to person, place, and time. He appears well-developed and well-nourished.  HENT:  Head: Normocephalic and atraumatic.  Eyes: EOM are normal.  Neck: Normal range of motion.  Cardiovascular: Normal rate, regular rhythm, normal heart sounds and intact distal pulses.   Pulmonary/Chest: Effort normal and breath sounds normal. No respiratory distress.  Abdominal: Soft. He exhibits no distension. There is no tenderness.  Genitourinary: Rectum normal.  Musculoskeletal: Normal range of motion.  Neurological: He is alert and oriented to person, place, and time.  Skin: Skin is warm and dry.  Psychiatric: He has a normal mood and affect. Judgment normal.    ED Course  Procedures (including critical care time)  Labs Reviewed  CBC WITH DIFFERENTIAL - Abnormal; Notable for the following:    Platelets 414 (*)    All other components within normal limits  COMPREHENSIVE METABOLIC PANEL - Abnormal; Notable for the following:    Glucose, Bld 150 (*)    Albumin 3.4 (*)    All other components within normal limits  ETHANOL  DRUG SCREEN PANEL (SERUM)   No results found.   No diagnosis found.    MDM  We'll have psychiatry evaluate the patient for his vague SI.  I suspect the majority of his issues are more related to his substance abuse.  Lyanne Co, MD 02/28/13 [phone]  Patient seen and evaluated by the psychiatrist Dr. Jacky Kindle, MD who recommends discharge home at this time.  He does not believe the patient to be a threat to himself or to others and recommends outpatient followup for his substance abuse issues.  The patient be given substance abuse  referrals.  Lyanne Co, MD 02/28/13 [phone]

## 2013-02-28 NOTE — ED Notes (Signed)
Patient moved to C22.

## 2013-03-03 DIAGNOSIS — M23329 Other meniscus derangements, posterior horn of medial meniscus, unspecified knee: Secondary | ICD-10-CM | POA: Diagnosis not present

## 2013-03-07 ENCOUNTER — Encounter (HOSPITAL_COMMUNITY): Payer: Self-pay | Admitting: *Deleted

## 2013-03-07 ENCOUNTER — Emergency Department (HOSPITAL_COMMUNITY)
Admission: EM | Admit: 2013-03-07 | Discharge: 2013-03-07 | Disposition: A | Discharge status: Home / Self Care | Payer: Medicare Other | Attending: Emergency Medicine | Admitting: Emergency Medicine

## 2013-03-07 DIAGNOSIS — M129 Arthropathy, unspecified: Secondary | ICD-10-CM | POA: Diagnosis not present

## 2013-03-07 DIAGNOSIS — M25569 Pain in unspecified knee: Secondary | ICD-10-CM | POA: Diagnosis not present

## 2013-03-07 DIAGNOSIS — Z8701 Personal history of pneumonia (recurrent): Secondary | ICD-10-CM | POA: Insufficient documentation

## 2013-03-07 DIAGNOSIS — Z8719 Personal history of other diseases of the digestive system: Secondary | ICD-10-CM | POA: Insufficient documentation

## 2013-03-07 DIAGNOSIS — Z8679 Personal history of other diseases of the circulatory system: Secondary | ICD-10-CM | POA: Diagnosis not present

## 2013-03-07 DIAGNOSIS — IMO0002 Reserved for concepts with insufficient information to code with codable children: Secondary | ICD-10-CM | POA: Diagnosis not present

## 2013-03-07 DIAGNOSIS — Z8659 Personal history of other mental and behavioral disorders: Secondary | ICD-10-CM | POA: Diagnosis not present

## 2013-03-07 DIAGNOSIS — Z872 Personal history of diseases of the skin and subcutaneous tissue: Secondary | ICD-10-CM | POA: Diagnosis not present

## 2013-03-07 DIAGNOSIS — G8929 Other chronic pain: Secondary | ICD-10-CM | POA: Diagnosis not present

## 2013-03-07 DIAGNOSIS — I10 Essential (primary) hypertension: Secondary | ICD-10-CM | POA: Diagnosis not present

## 2013-03-07 DIAGNOSIS — Z880 Allergy status to penicillin: Secondary | ICD-10-CM | POA: Diagnosis not present

## 2013-03-07 DIAGNOSIS — Z8739 Personal history of other diseases of the musculoskeletal system and connective tissue: Secondary | ICD-10-CM | POA: Diagnosis not present

## 2013-03-07 DIAGNOSIS — F17200 Nicotine dependence, unspecified, uncomplicated: Secondary | ICD-10-CM | POA: Insufficient documentation

## 2013-03-07 NOTE — ED Provider Notes (Signed)
History  This chart was scribed for Dierdre Forth, PA-C working with Juliet Rude. Rubin Payor, MD by Ardelia Mems, ED Scribe. This patient was seen in room WTR7/WTR7 and the patient's care was started at 11:24 PM.   CSN: [id number]  Arrival date & time 03/07/13  2307     Chief Complaint  Patient presents with  . Knee Pain     The history is provided by the patient and the police. No language interpreter was used.    HPI Comments: Andrew [name] is a 47 y.o. male with a h/o bipolar disorder, schizoaffective disorder and arthritis brought in by [employer] who presents to the Emergency Department complaining of constant, moderate right knee pain. Pt reports that he has arthritis in his knee and a possible torn ligament. Pt states that he has a scheduled knee surgery. Pt is able to bend his right knee. Pt does not appear to have an injury to his left knee. Pt states that he can walk on his left knee but only with severe pain.  [employer] states the patient is here for evaluation of his knee and possible changing of his knee brace. [employer] date patient cannot go to jail with the current knee brace because it has metal in it.  They're requesting a soft knee brace.   Past Medical History  Diagnosis Date  . Bipolar disorder   . Eczema   . Low back pain   . Elevated blood pressure reading without diagnosis of hypertension   . Abdominal wall hernia   . Poor circulation   . Arthritis   . Hypertension   . Anginal pain   . Pneumonia ~ 10/2011  . Umbilical hernia     "needs to be repaired"  . Generalized headaches     "just about every day"  . Anxiety   . Depression   . Schizoaffective disorder     Past Surgical History  Procedure Laterality Date  . Lipoma excision  ~ 2010    "back left side of my head"    Family History  Problem Relation Age of Onset  . Hypertension Mother   . Cancer Mother   . Heart disease Father   . Heart attack Father 27  . Hyperlipidemia Father   . Hypertension Father    . Diabetes Sister   . Diabetes Brother     History  Substance Use Topics  . Smoking status: Current Some Day Smoker -- 1.00 packs/day for 30 years    Types: Cigarettes  . Smokeless tobacco: Never Used  . Alcohol Use: No     Comment: 03/31/12 "2 bottles of wine/month; shot of liquor couple times/month      Review of Systems  Constitutional: Negative for fever and chills.  HENT: Negative for neck pain.   Gastrointestinal: Negative for nausea, vomiting and diarrhea.  Musculoskeletal: Positive for arthralgias.  Skin: Negative for rash.  Psychiatric/Behavioral: Positive for agitation.  All other systems reviewed and are negative.    Allergies  Penicillins  Home Medications   Current Outpatient Rx  Name  Route  Sig  Dispense  Refill  . oxyCODONE-acetaminophen (PERCOCET/ROXICET) 5-325 MG per tablet   Oral   Take 1-2 tablets by mouth every 6 (six) hours as needed for pain.   15 tablet   0     Triage Vitals: BP 126/92  Pulse 74  Resp 20  SpO2 100%  Physical Exam  Nursing note and vitals reviewed. Constitutional: He appears well-developed and well-nourished. No distress.  HENT:  Head: Normocephalic and atraumatic.  Eyes: Conjunctivae are normal. Pupils are equal, round, and reactive to light.  Neck: Normal range of motion and full passive range of motion without pain. No spinous process tenderness and no muscular tenderness present. No rigidity. Normal range of motion present.  Cardiovascular: Normal rate, regular rhythm, normal heart sounds and intact distal pulses.   No murmur heard. Pulses:      Posterior tibial pulses are 2+ on the right side, and 2+ on the left side.  Capillary refill < 3 sec  Pulmonary/Chest: Effort normal and breath sounds normal. No respiratory distress. He has no wheezes.  Musculoskeletal: He exhibits tenderness. He exhibits no edema.       Right knee: He exhibits swelling and effusion. He exhibits normal range of motion, no ecchymosis, no  deformity, no laceration and no erythema. Tenderness found. Medial joint line and lateral joint line tenderness noted.       Legs: ROM: Full range of motion of the right knee with pain  Patient ambulates with a mild limp but steady gait and normal balance without brace  Lymphadenopathy:    He has no cervical adenopathy.  Neurological: He is alert. Coordination normal. GCS eye subscore is 4. GCS verbal subscore is 5. GCS motor subscore is 6.  Reflex Scores:      Patellar reflexes are 2+ on the right side and 2+ on the left side.      Achilles reflexes are 2+ on the right side and 2+ on the left side. Sensation intact the bilateral lower extremities Strength 5 out of 5 in the bilateral lower extremities  Skin: Skin is warm and dry. He is not diaphoretic. No erythema.  No tenting of the skin  Psychiatric: He has a normal mood and affect.    ED Course  Procedures (including critical care time)  DIAGNOSTIC STUDIES: Oxygen Saturation is 100% on RA, normal by my interpretation.    COORDINATION OF CARE: 11:26 PM- Pt advised of plan for treatment and pt agrees.     Labs Reviewed - No data to display No results found.   1. Knee pain, chronic, right       MDM  Frederich Cha presents with chronic pain in the right knee and in need of a different kind of brace.  No x-ray indicated as pt has chronic pain and there has been no reported acute injury. Pt advised to follow up with orthopedics for his already scheduled knee surgery.   Patient given soft brace while in ED, conservative therapy recommended and discussed. Recommended return to the hard brace when released from jail.  Patient will be d/c with [employer].    I personally performed the services described in this documentation, which was scribed in my presence. The recorded information has been reviewed and is accurate.   Dahlia Client Vilma Will, PA-C 03/08/13 0031

## 2013-03-07 NOTE — ED Notes (Signed)
Pt in with [employer], need knee brace evaluated for admission into jail.

## 2013-03-15 NOTE — ED Provider Notes (Signed)
Medical screening examination/treatment/procedure(s) were performed by non-physician practitioner and as supervising physician I was immediately available for consultation/collaboration.  Juliet Rude. Rubin Payor, MD 03/15/13 [phone]

## 2013-08-12 ENCOUNTER — Other Ambulatory Visit: Payer: Self-pay

## 2013-08-26 ENCOUNTER — Emergency Department (HOSPITAL_COMMUNITY)
Admission: EM | Admit: 2013-08-26 | Discharge: 2013-08-26 | Disposition: A | Discharge status: Home / Self Care | Payer: Medicare Other | Attending: Emergency Medicine | Admitting: Emergency Medicine

## 2013-08-26 ENCOUNTER — Encounter (HOSPITAL_COMMUNITY): Payer: Self-pay | Admitting: Emergency Medicine

## 2013-08-26 DIAGNOSIS — Z8719 Personal history of other diseases of the digestive system: Secondary | ICD-10-CM | POA: Insufficient documentation

## 2013-08-26 DIAGNOSIS — I10 Essential (primary) hypertension: Secondary | ICD-10-CM | POA: Insufficient documentation

## 2013-08-26 DIAGNOSIS — M129 Arthropathy, unspecified: Secondary | ICD-10-CM | POA: Diagnosis not present

## 2013-08-26 DIAGNOSIS — Z8659 Personal history of other mental and behavioral disorders: Secondary | ICD-10-CM | POA: Diagnosis not present

## 2013-08-26 DIAGNOSIS — Z872 Personal history of diseases of the skin and subcutaneous tissue: Secondary | ICD-10-CM | POA: Insufficient documentation

## 2013-08-26 DIAGNOSIS — Z8701 Personal history of pneumonia (recurrent): Secondary | ICD-10-CM | POA: Insufficient documentation

## 2013-08-26 DIAGNOSIS — Z880 Allergy status to penicillin: Secondary | ICD-10-CM | POA: Diagnosis not present

## 2013-08-26 DIAGNOSIS — N342 Other urethritis: Secondary | ICD-10-CM | POA: Diagnosis not present

## 2013-08-26 DIAGNOSIS — I209 Angina pectoris, unspecified: Secondary | ICD-10-CM | POA: Insufficient documentation

## 2013-08-26 DIAGNOSIS — F17200 Nicotine dependence, unspecified, uncomplicated: Secondary | ICD-10-CM | POA: Insufficient documentation

## 2013-08-26 LAB — URINE MICROSCOPIC-ADD ON

## 2013-08-26 LAB — URINALYSIS, ROUTINE W REFLEX MICROSCOPIC
Nitrite: NEGATIVE
Specific Gravity, Urine: 1.022 (ref 1.005–1.030)
Urobilinogen, UA: 0.2 mg/dL (ref 0.0–1.0)

## 2013-08-26 MED ORDER — AZITHROMYCIN 250 MG PO TABS
1000.0000 mg | ORAL_TABLET | Freq: Once | ORAL | Status: AC
  Administered 2013-08-26: 1000 mg via ORAL
  Filled 2013-08-26: qty 4

## 2013-08-26 MED ORDER — LIDOCAINE HCL (PF) 1 % IJ SOLN
INTRAMUSCULAR | Status: AC
  Administered 2013-08-26: 5 mL
  Filled 2013-08-26: qty 5

## 2013-08-26 MED ORDER — CEFTRIAXONE SODIUM 250 MG IJ SOLR
250.0000 mg | Freq: Once | INTRAMUSCULAR | Status: AC
  Administered 2013-08-26: 250 mg via INTRAMUSCULAR
  Filled 2013-08-26: qty 250

## 2013-08-26 NOTE — ED Provider Notes (Signed)
CSN: [id number]     Arrival date & time 08/26/13  1422 History   First MD Initiated Contact with Patient 08/26/13 1502     Chief Complaint  Patient presents with  . Hematuria    The history is provided by the patient.   patient reports blood in his urine over the past 24 hours.  He states that the majority of the blood that he noticed towards the end of his urination.  He also found a small amount of blood in his underwear.  He denies rectal bleeding.  Denies urinary frequency.  He does report some painful urination.  He denies urethral discharge.  No new sexual partners.  The patient is a monogamous relationship.  His symptoms are mild in severity.  Nothing worsens or improves his symptoms.         Past Medical History  Diagnosis Date  . Bipolar disorder   . Eczema   . Low back pain   . Elevated blood pressure reading without diagnosis of hypertension   . Abdominal wall hernia   . Poor circulation   . Arthritis   . Hypertension   . Anginal pain   . Pneumonia ~ 10/2011  . Umbilical hernia     "needs to be repaired"  . Generalized headaches     "just about every day"  . Anxiety   . Depression   . Schizoaffective disorder    Past Surgical History  Procedure Laterality Date  . Lipoma excision  ~ 2010    "back left side of my head"   Family History  Problem Relation Age of Onset  . Hypertension Mother   . Cancer Mother   . Heart disease Father   . Heart attack Father 70  . Hyperlipidemia Father   . Hypertension Father   . Diabetes Sister   . Diabetes Brother    History  Substance Use Topics  . Smoking status: Current Some Day Smoker -- 1.00 packs/day for 30 years    Types: Cigarettes  . Smokeless tobacco: Never Used  . Alcohol Use: No     Comment: 03/31/12 "2 bottles of wine/month; shot of liquor couple times/month    Review of Systems  Genitourinary: Positive for hematuria.  All other systems reviewed and are negative.    Allergies  Penicillins  Home  Medications   Current Outpatient Rx  Name  Route  Sig  Dispense  Refill  . acetaminophen (TYLENOL) 325 MG tablet   Oral   Take 650 mg by mouth once.          BP 117/81  Pulse 87  Temp(Src) 97.8 F (36.6 C) (Oral)  Resp 20  Ht 5' 7.5" (1.715 m)  Wt 205 lb 3 oz (93.072 kg)  BMI 31.64 kg/m2  SpO2 96% Physical Exam  Nursing note and vitals reviewed. Constitutional: He is oriented to person, place, and time. He appears well-developed and well-nourished.  HENT:  Head: Normocephalic and atraumatic.  Eyes: EOM are normal.  Neck: Normal range of motion.  Cardiovascular: Normal rate.   Pulmonary/Chest: Effort normal and breath sounds normal.  Abdominal: Soft. He exhibits no distension. There is no tenderness.  Genitourinary:  Normal uncircumcised penis.  Normal testes bilaterally.  No testicular tenderness.  No scrotal masses.  Mild tenderness along the shaft of his penis.  No urethral discharge noted.  No blood at the meatus  Musculoskeletal: Normal range of motion.  Neurological: He is alert and oriented to person, place, and time.  Skin: Skin is warm and dry.  Psychiatric: He has a normal mood and affect. Judgment normal.    ED Course  Procedures (including critical care time) Labs Review Labs Reviewed  URINALYSIS, ROUTINE W REFLEX MICROSCOPIC - Abnormal; Notable for the following:    APPearance HAZY (*)    Hgb urine dipstick MODERATE (*)    Leukocytes, UA SMALL (*)    All other components within normal limits  URINE MICROSCOPIC-ADD ON - Abnormal; Notable for the following:    Squamous Epithelial / LPF FEW (*)    All other components within normal limits  GC/CHLAMYDIA PROBE AMP   Imaging Review No results found.  EKG Interpretation   None       MDM   1. Urethritis    Will treat this as possible urethra this.  Rocephin and azithromycin.  GC and gonorrhea cultures pending.  Urine culture pending.    Lyanne Co, MD 08/26/13 1556

## 2013-08-26 NOTE — ED Notes (Signed)
Per pt noticed blood in urine yesterday. sts also pain while urinating.

## 2013-08-26 NOTE — ED Notes (Signed)
Upon entering room to review discharge instructions, pt had left room. Unable to locate pt to review paperwork at this time.

## 2013-08-26 NOTE — ED Notes (Signed)
Pt rates pain 8/10 denies need for pain medication. Provider at bedside for specimen collection.

## 2013-09-20 ENCOUNTER — Ambulatory Visit (INDEPENDENT_AMBULATORY_CARE_PROVIDER_SITE_OTHER): Payer: Medicare Other | Admitting: Family Medicine

## 2013-09-20 ENCOUNTER — Encounter: Payer: Self-pay | Admitting: Family Medicine

## 2013-09-20 VITALS — BP 125/83 | HR 66 | Temp 98.5°F | Ht 67.5 in | Wt 212.0 lb

## 2013-09-20 DIAGNOSIS — Z13220 Encounter for screening for lipoid disorders: Secondary | ICD-10-CM

## 2013-09-20 DIAGNOSIS — K219 Gastro-esophageal reflux disease without esophagitis: Secondary | ICD-10-CM

## 2013-09-20 DIAGNOSIS — Z0000 Encounter for general adult medical examination without abnormal findings: Secondary | ICD-10-CM

## 2013-09-20 DIAGNOSIS — E669 Obesity, unspecified: Secondary | ICD-10-CM

## 2013-09-20 LAB — COMPREHENSIVE METABOLIC PANEL
Albumin: 4.1 g/dL (ref 3.5–5.2)
Alkaline Phosphatase: 70 U/L (ref 39–117)
BUN: 12 mg/dL (ref 6–23)
CO2: 27 mEq/L (ref 19–32)
Glucose, Bld: 100 mg/dL — ABNORMAL HIGH (ref 70–99)
Total Bilirubin: 0.4 mg/dL (ref 0.3–1.2)

## 2013-09-20 LAB — LIPID PANEL
HDL: 49 mg/dL (ref >39)
LDL Cholesterol: 121 mg/dL — ABNORMAL HIGH (ref 0–99)
Total CHOL/HDL Ratio: 3.8 Ratio
Triglycerides: 79 mg/dL (ref <150)

## 2013-09-20 LAB — CBC
HCT: 48.2 % (ref 39.0–52.0)
Hemoglobin: 16.6 g/dL (ref 13.0–17.0)
MCH: 31.8 pg (ref 26.0–34.0)
MCHC: 34.4 g/dL (ref 30.0–36.0)

## 2013-09-20 MED ORDER — PANTOPRAZOLE SODIUM 40 MG PO TBEC: 40.0000 mg | DELAYED_RELEASE_TABLET | Freq: Every day | ORAL | Status: DC

## 2013-09-20 NOTE — Progress Notes (Signed)
Patient ID: Andrew [name], male   DOB: 06/07/1966, 47 y.o.   MRN: [id number]  Andrew [name] is a 47 y.o. male who presents to [facility] today for physical. His concerns today include abdominal pain and knee swelling  Abd Pain - He reports symptoms of reflux.  Endorses burping and burning in his throat.  Says that if he overeats he will occasionally vomit.  Feels some coughing/choking at night when he lies down. He has a history of umbilical hernia, but his symptoms are not consistent with this. (No sharp pain periumbilical, difficulty having BM, etc.) His symptoms somewhat interfere with his daily life.  Knee swelling- Had accident many months ago, followed at [facility] for right knee pain. States he needs surgery but cannot afford it at this time. He is wondering if the orthopedist will drain his knee. However, he does not want an exam on his knee here today.  Health maintenance- Needs lipid panel today as an overweight african [place] male. Declined flu shot.    The following portions of the patient's history were reviewed and updated as appropriate: allergies, current medications, past medical history, family and social history, and problem list.  Patient is a current smoker.  Past Medical History  Diagnosis Date  . Bipolar disorder   . Eczema   . Low back pain   . Elevated blood pressure reading without diagnosis of hypertension   . Abdominal wall hernia   . Poor circulation   . Arthritis   . Hypertension   . Anginal pain   . Pneumonia ~ 10/2011  . Umbilical hernia     "needs to be repaired"  . Generalized headaches     "just about every day"  . Anxiety   . Depression   . Schizoaffective disorder     ROS as above, reports some knee pain/swelling from a previous injury.    Medications reviewed. Current Outpatient Prescriptions  Medication Sig Dispense Refill  . acetaminophen (TYLENOL) 325 MG tablet Take 650 mg by mouth once.      . pantoprazole (PROTONIX) 40 MG tablet Take 1  tablet (40 mg total) by mouth daily.  30 tablet  5   No current facility-administered medications for this visit.    Exam:  BP 125/83  Pulse 66  Temp(Src) 98.5 F (36.9 C) (Oral)  Ht 5' 7.5" (1.715 m)  Wt 212 lb (96.163 kg)  BMI 32.69 kg/m2 Gen: Well NAD HEENT: EOMI,  MMM Lungs: CTABL Nl WOB Heart: RRR Abd: Non tender Exts: Non edematous BL. Does have brace on right knee, but declines further exam   Assessment Andrew [name] is a 47 y.o. male who presents for a routine physical.  Plan  Reflux: -Prescribed Protonix 40mg  daily for symptom relief -Follow-up in 6 months  Health Maintenance: -CBC, CMP, and lipid panel drawn today.

## 2013-09-20 NOTE — Assessment & Plan Note (Signed)
Protonix 40mg  daily, f/u 6 months or sooner if needed.

## 2013-09-20 NOTE — Patient Instructions (Addendum)
It was nice to see you today Andrew [name].  -For your blood pressure: continue to check it at home, and write down your measurements.  Bring these in to [place] so we can see how your pressures are doing.  -For your reflux we have prescribed Protonix, [facility].  Take it once a day and it should help with your symptoms.  -We have drawn blood to look at your cholesterol today.  We will send you a letter in the mail with your results.  -Please see [place] back in 6 months.

## 2013-09-22 ENCOUNTER — Other Ambulatory Visit (HOSPITAL_COMMUNITY): Payer: Self-pay | Admitting: Orthopedic Surgery

## 2013-09-22 ENCOUNTER — Encounter (HOSPITAL_COMMUNITY): Payer: Self-pay | Admitting: Pharmacy Technician

## 2013-09-22 DIAGNOSIS — M23329 Other meniscus derangements, posterior horn of medial meniscus, unspecified knee: Secondary | ICD-10-CM | POA: Diagnosis not present

## 2013-09-22 NOTE — Pre-Procedure Instructions (Addendum)
[name]  09/22/2013   Your procedure is scheduled on: Tuesday, December 23rd.    Report to [facility]  2 * 3 at  5:30 AM.         Call this number if you have problems the morning of surgery: [phone]   Remember:   Do not eat food or drink liquids after midnight Monday.   Take these medicines the morning of surgery with A SIP OF WATER: Protonix   Do not wear jewelry--no rings or watches.  Do not wear lotions, powders, or colognes.  You may NOT wear deodorant.   Men may shave face and neck.  Do not bring valuables to the hospital.  [facility] is not responsible for any belongings or valuables.               Contacts, dentures or bridgework may not be worn into surgery.  Leave suitcase in the car. After surgery it may be brought to your room.              Patients discharged the day of surgery will not be allowed to drive home, and will need to have a responsible adult stay with you for the first 24 hrs.   Name and phone number of your driver:  Barrett Henle   --   FRIEND    [phone]   Special Instructions: Shower using CHG 2 nights before surgery and the night before surgery.  If you shower the day of surgery use CHG.  Use special wash - you have one bottle of CHG for all showers.  You should use approximately 1/3 of the bottle for each shower.   Please read over the following fact sheets that you were given: Pain Booklet and Surgical Site Infection Prevention

## 2013-09-23 ENCOUNTER — Encounter: Payer: Self-pay | Admitting: Family Medicine

## 2013-09-23 ENCOUNTER — Encounter (HOSPITAL_COMMUNITY): Payer: Self-pay

## 2013-09-23 ENCOUNTER — Encounter (HOSPITAL_COMMUNITY)
Admission: RE | Admit: 2013-09-23 | Discharge: 2013-09-23 | Disposition: A | Discharge status: Home / Self Care | Payer: Medicare Other | Source: Ambulatory Visit | Attending: Orthopedic Surgery | Admitting: Orthopedic Surgery

## 2013-09-23 DIAGNOSIS — Z01810 Encounter for preprocedural cardiovascular examination: Secondary | ICD-10-CM | POA: Insufficient documentation

## 2013-09-23 DIAGNOSIS — Z01812 Encounter for preprocedural laboratory examination: Secondary | ICD-10-CM | POA: Insufficient documentation

## 2013-09-23 LAB — CBC
Hemoglobin: 16.9 g/dL (ref 13.0–17.0)
MCHC: 34.6 g/dL (ref 30.0–36.0)
Platelets: 284 10*3/uL (ref 150–400)
RBC: 5.21 MIL/uL (ref 4.22–5.81)
WBC: 6.5 10*3/uL (ref 4.0–10.5)

## 2013-09-23 LAB — COMPREHENSIVE METABOLIC PANEL
ALT: 20 U/L (ref 0–53)
AST: 20 U/L (ref 0–37)
Albumin: 3.5 g/dL (ref 3.5–5.2)
Alkaline Phosphatase: 71 U/L (ref 39–117)
CO2: 26 mEq/L (ref 19–32)
Chloride: 102 mEq/L (ref 96–112)
GFR calc non Af Amer: >90 mL/min (ref >90)
Potassium: 4.2 mEq/L (ref 3.5–5.1)
Sodium: 135 mEq/L (ref 135–145)
Total Bilirubin: 0.4 mg/dL (ref 0.3–1.2)

## 2013-09-23 NOTE — Progress Notes (Addendum)
After interviewing patient and asking about using any 'street drugs'..he told me that its been 4 yrs.  But after checking more into his hx, i found out he had used cocaine in May 2014.  When confronted with this new info.he stated he had used then, but has not used any since.  I strongly urged him NOT to.  DA He also told me that he had "stress test", but that was around 4 yrs ago, and he thinks it was at [facility]--but not sure.   No stress test results @ [facility] .  DA

## 2013-09-27 MED ORDER — CLINDAMYCIN PHOSPHATE 900 MG/50ML IV SOLN
900.0000 mg | INTRAVENOUS | Status: DC
  Filled 2013-09-27: qty 50

## 2013-09-28 ENCOUNTER — Encounter (HOSPITAL_COMMUNITY): Payer: Medicare Other | Admitting: Anesthesiology

## 2013-09-28 ENCOUNTER — Ambulatory Visit (HOSPITAL_COMMUNITY): Payer: Medicare Other | Admitting: Anesthesiology

## 2013-09-28 ENCOUNTER — Encounter (HOSPITAL_COMMUNITY)
Admission: RE | Disposition: A | Discharge status: Home / Self Care | Payer: Self-pay | Source: Ambulatory Visit | Attending: Orthopedic Surgery

## 2013-09-28 ENCOUNTER — Ambulatory Visit (HOSPITAL_COMMUNITY)
Admission: RE | Admit: 2013-09-28 | Discharge: 2013-09-28 | Disposition: A | Discharge status: Home / Self Care | Payer: Medicare Other | Source: Ambulatory Visit | Attending: Orthopedic Surgery | Admitting: Orthopedic Surgery

## 2013-09-28 ENCOUNTER — Encounter (HOSPITAL_COMMUNITY): Payer: Self-pay | Admitting: *Deleted

## 2013-09-28 DIAGNOSIS — Z5309 Procedure and treatment not carried out because of other contraindication: Secondary | ICD-10-CM | POA: Diagnosis not present

## 2013-09-28 DIAGNOSIS — M23305 Other meniscus derangements, unspecified medial meniscus, unspecified knee: Secondary | ICD-10-CM | POA: Insufficient documentation

## 2013-09-28 DIAGNOSIS — K047 Periapical abscess without sinus: Secondary | ICD-10-CM | POA: Diagnosis not present

## 2013-09-28 SURGERY — ARTHROSCOPY, KNEE
Anesthesia: General

## 2013-09-28 MED ORDER — ACETAMINOPHEN-CODEINE 120-12 MG/5ML PO SOLN
ORAL | Status: AC
  Filled 2013-09-28: qty 10

## 2013-09-28 MED ORDER — CHLORHEXIDINE GLUCONATE 4 % EX LIQD: 60.0000 mL | Freq: Once | CUTANEOUS | Status: DC

## 2013-09-28 MED ORDER — ACETAMINOPHEN-CODEINE #3 300-30 MG PO TABS: 1.0000 | ORAL_TABLET | ORAL | Status: DC | PRN

## 2013-09-28 SURGICAL SUPPLY — 48 items
BANDAGE ELASTIC 6 VELCRO ST LF (GAUZE/BANDAGES/DRESSINGS) IMPLANT
BANDAGE ESMARK 6X9 LF (GAUZE/BANDAGES/DRESSINGS) IMPLANT
BLADE CUDA 5.5 (BLADE) IMPLANT
BLADE GREAT WHITE 4.2 (BLADE) IMPLANT
BLADE SURG ROTATE 9660 (MISCELLANEOUS) IMPLANT
BNDG CMPR 9X6 STRL LF SNTH (GAUZE/BANDAGES/DRESSINGS)
BNDG ESMARK 6X9 LF (GAUZE/BANDAGES/DRESSINGS)
BUR OVAL 6.0 (BURR) IMPLANT
CLOTH BEACON ORANGE TIMEOUT ST (SAFETY) ×3 IMPLANT
COVER SURGICAL LIGHT HANDLE (MISCELLANEOUS) ×3 IMPLANT
CUFF TOURNIQUET SINGLE 34IN LL (TOURNIQUET CUFF) IMPLANT
CUFF TOURNIQUET SINGLE 44IN (TOURNIQUET CUFF) IMPLANT
DRAPE ARTHROSCOPY W/POUCH 114 (DRAPES) ×3 IMPLANT
DRAPE INCISE IOBAN 66X45 STRL (DRAPES) IMPLANT
DRAPE PROXIMA HALF (DRAPES) IMPLANT
DRAPE U-SHAPE 47X51 STRL (DRAPES) ×3 IMPLANT
DURAPREP 26ML APPLICATOR (WOUND CARE) ×3 IMPLANT
GAUZE XEROFORM 1X8 LF (GAUZE/BANDAGES/DRESSINGS) IMPLANT
GLOVE BIOGEL PI IND STRL 8 (GLOVE) ×2 IMPLANT
GLOVE BIOGEL PI INDICATOR 8 (GLOVE) ×1
GLOVE SURG ORTHO 8.0 STRL STRW (GLOVE) ×3 IMPLANT
GOWN PREVENTION PLUS XLARGE (GOWN DISPOSABLE) ×3 IMPLANT
GOWN STRL NON-REIN LRG LVL3 (GOWN DISPOSABLE) ×6 IMPLANT
KIT BASIN OR (CUSTOM PROCEDURE TRAY) ×3 IMPLANT
KIT ROOM TURNOVER OR (KITS) ×3 IMPLANT
MANIFOLD NEPTUNE II (INSTRUMENTS) IMPLANT
NDL 18GX1X1/2 (RX/OR ONLY) (NEEDLE) IMPLANT
NDL HYPO 25GX1X1/2 BEV (NEEDLE) ×1 IMPLANT
NEEDLE 18GX1X1/2 (RX/OR ONLY) (NEEDLE) IMPLANT
NEEDLE HYPO 25GX1X1/2 BEV (NEEDLE) ×2 IMPLANT
NS IRRIG 1000ML POUR BTL (IV SOLUTION) IMPLANT
PACK ARTHROSCOPY DSU (CUSTOM PROCEDURE TRAY) ×3 IMPLANT
PAD ARMBOARD 7.5X6 YLW CONV (MISCELLANEOUS) ×6 IMPLANT
PADDING CAST COTTON 6X4 STRL (CAST SUPPLIES) ×3 IMPLANT
SET ARTHROSCOPY TUBING (MISCELLANEOUS) ×2
SET ARTHROSCOPY TUBING LN (MISCELLANEOUS) ×2 IMPLANT
SPONGE GAUZE 4X4 12PLY (GAUZE/BANDAGES/DRESSINGS) IMPLANT
SPONGE LAP 4X18 X RAY DECT (DISPOSABLE) ×3 IMPLANT
SUT ETHILON 3 0 PS 1 (SUTURE) IMPLANT
SUT MENISCAL KIT (KITS) IMPLANT
SYR 20ML ECCENTRIC (SYRINGE) ×3 IMPLANT
SYR CONTROL 10ML LL (SYRINGE) IMPLANT
SYR TB 1ML LUER SLIP (SYRINGE) ×3 IMPLANT
TOWEL OR 17X24 6PK STRL BLUE (TOWEL DISPOSABLE) ×3 IMPLANT
TOWEL OR 17X26 10 PK STRL BLUE (TOWEL DISPOSABLE) ×3 IMPLANT
TUBE CONNECTING 12X1/4 (SUCTIONS) ×3 IMPLANT
WAND 90 DEG TURBOVAC W/CORD (SURGICAL WAND) ×3 IMPLANT
WATER STERILE IRR 1000ML POUR (IV SOLUTION) ×3 IMPLANT

## 2013-09-28 NOTE — Anesthesia Preprocedure Evaluation (Addendum)
Anesthesia Evaluation  Patient identified by MRN, date of birth, ID band Patient awake    Reviewed: Allergy & Precautions, H&P , NPO status   History of Anesthesia Complications Negative for: history of anesthetic complications  Airway Mallampati: II      Dental   Pulmonary Current Smoker,  1 ppd breath sounds clear to auscultation  Pulmonary exam normal       Cardiovascular hypertension, + angina Rhythm:Regular Rate:Normal  Patient states had chest pain x 1, medicated, and has not recurred   Neuro/Psych  Headaches, Bipolar Disorder Headaches    GI/Hepatic Neg liver ROS, GERD-  Medicated and Controlled,Related to type and amount of food consumption.   Endo/Other  negative endocrine ROS  Renal/GU negative Renal ROS     Musculoskeletal   Abdominal Normal abdominal exam  (+)   Peds  Hematology negative hematology ROS (+)   Anesthesia Other Findings   Reproductive/Obstetrics                          Anesthesia Physical Anesthesia Plan  ASA: III  Anesthesia Plan: General   Post-op Pain Management:    Induction: Intravenous  Airway Management Planned: LMA  Additional Equipment:   Intra-op Plan:   Post-operative Plan: Extubation in OR  Informed Consent: I have reviewed the patients History and Physical, chart, labs and discussed the procedure including the risks, benefits and alternatives for the proposed anesthesia with the patient or authorized representative who has indicated his/her understanding and acceptance.   Dental advisory given  Plan Discussed with: CRNA and Anesthesiologist  Anesthesia Plan Comments:        Anesthesia Quick Evaluation

## 2013-09-28 NOTE — Progress Notes (Signed)
Pt given Tylenol with Codeine liquid form

## 2013-09-28 NOTE — Progress Notes (Signed)
Pt with likely early tooth abcess Developed last pm Recommend to him  dental consult - pt aware from last visit with dentist that more work needed t 3 prescribed - surgery canceled for now

## 2013-10-15 ENCOUNTER — Telehealth: Payer: Self-pay | Admitting: Family Medicine

## 2013-10-15 MED ORDER — SIMVASTATIN 40 MG PO TABS: 40.0000 mg | ORAL_TABLET | Freq: Every day | ORAL | Status: DC

## 2013-10-15 NOTE — Telephone Encounter (Signed)
LMOVM informing pt that "MD went ahead and sent Rx in, please keep appt on 10/25/13". Andrew [name], [name]

## 2013-10-15 NOTE — Telephone Encounter (Signed)
Needs cholestrol pills. He cant control his diet right now. Also wanted to ask her "questions about something"

## 2013-10-15 NOTE — Telephone Encounter (Signed)
I went ahead and [facility] He should take one pill at bedtime. This is for his elevated LDL from his labs in December. Thank you for making an appointment for him to come in to discuss other issues.   Amber M. Hairford, M.D.

## 2013-10-15 NOTE — Telephone Encounter (Signed)
Advised that I would have to send a message to MD about cholesterol pills.  Pt states that he "cant exercise right now and is hungry all the time".  Pt made appt with MD to talk about headaches and to get cholesterol pills if MD approves. Takeyla Million, [name]

## 2013-10-23 ENCOUNTER — Emergency Department (HOSPITAL_COMMUNITY)
Admission: EM | Admit: 2013-10-23 | Discharge: 2013-10-23 | Disposition: A | Discharge status: Home / Self Care | Payer: Medicare Other | Attending: Emergency Medicine | Admitting: Emergency Medicine

## 2013-10-23 ENCOUNTER — Other Ambulatory Visit: Payer: Self-pay

## 2013-10-23 ENCOUNTER — Encounter (HOSPITAL_COMMUNITY): Payer: Self-pay | Admitting: Emergency Medicine

## 2013-10-23 ENCOUNTER — Emergency Department (HOSPITAL_COMMUNITY): Payer: Medicare Other

## 2013-10-23 DIAGNOSIS — Z79899 Other long term (current) drug therapy: Secondary | ICD-10-CM | POA: Insufficient documentation

## 2013-10-23 DIAGNOSIS — F3289 Other specified depressive episodes: Secondary | ICD-10-CM | POA: Diagnosis not present

## 2013-10-23 DIAGNOSIS — Z8739 Personal history of other diseases of the musculoskeletal system and connective tissue: Secondary | ICD-10-CM | POA: Insufficient documentation

## 2013-10-23 DIAGNOSIS — J438 Other emphysema: Secondary | ICD-10-CM | POA: Diagnosis not present

## 2013-10-23 DIAGNOSIS — F329 Major depressive disorder, single episode, unspecified: Secondary | ICD-10-CM | POA: Diagnosis not present

## 2013-10-23 DIAGNOSIS — R519 Headache, unspecified: Secondary | ICD-10-CM

## 2013-10-23 DIAGNOSIS — Z8719 Personal history of other diseases of the digestive system: Secondary | ICD-10-CM | POA: Diagnosis not present

## 2013-10-23 DIAGNOSIS — R071 Chest pain on breathing: Secondary | ICD-10-CM | POA: Insufficient documentation

## 2013-10-23 DIAGNOSIS — F17200 Nicotine dependence, unspecified, uncomplicated: Secondary | ICD-10-CM | POA: Diagnosis not present

## 2013-10-23 DIAGNOSIS — Z880 Allergy status to penicillin: Secondary | ICD-10-CM | POA: Diagnosis not present

## 2013-10-23 DIAGNOSIS — I10 Essential (primary) hypertension: Secondary | ICD-10-CM | POA: Insufficient documentation

## 2013-10-23 DIAGNOSIS — F411 Generalized anxiety disorder: Secondary | ICD-10-CM | POA: Insufficient documentation

## 2013-10-23 DIAGNOSIS — Z8659 Personal history of other mental and behavioral disorders: Secondary | ICD-10-CM | POA: Insufficient documentation

## 2013-10-23 DIAGNOSIS — Z8701 Personal history of pneumonia (recurrent): Secondary | ICD-10-CM | POA: Diagnosis not present

## 2013-10-23 DIAGNOSIS — R51 Headache: Secondary | ICD-10-CM | POA: Insufficient documentation

## 2013-10-23 DIAGNOSIS — Z872 Personal history of diseases of the skin and subcutaneous tissue: Secondary | ICD-10-CM | POA: Insufficient documentation

## 2013-10-23 DIAGNOSIS — R0789 Other chest pain: Secondary | ICD-10-CM

## 2013-10-23 LAB — CBC WITH DIFFERENTIAL/PLATELET
BASOS ABS: 0 10*3/uL (ref 0.0–0.1)
BASOS PCT: 0 % (ref 0–1)
Eosinophils Absolute: 0.1 10*3/uL (ref 0.0–0.7)
Eosinophils Relative: 2 % (ref 0–5)
HCT: 48.4 % (ref 39.0–52.0)
HEMOGLOBIN: 16.8 g/dL (ref 13.0–17.0)
LYMPHS PCT: 38 % (ref 12–46)
Lymphs Abs: 2.2 10*3/uL (ref 0.7–4.0)
MCH: 32.4 pg (ref 26.0–34.0)
MCHC: 34.7 g/dL (ref 30.0–36.0)
MCV: 93.4 fL (ref 78.0–100.0)
MONO ABS: 0.4 10*3/uL (ref 0.1–1.0)
Monocytes Relative: 7 % (ref 3–12)
NEUTROS ABS: 3.1 10*3/uL (ref 1.7–7.7)
NEUTROS PCT: 52 % (ref 43–77)
Platelets: 298 10*3/uL (ref 150–400)
RBC: 5.18 MIL/uL (ref 4.22–5.81)
RDW: 13 % (ref 11.5–15.5)
WBC: 5.8 10*3/uL (ref 4.0–10.5)

## 2013-10-23 LAB — COMPREHENSIVE METABOLIC PANEL
ALT: 17 U/L (ref 0–53)
AST: 17 U/L (ref 0–37)
Albumin: 3.2 g/dL — ABNORMAL LOW (ref 3.5–5.2)
Alkaline Phosphatase: 74 U/L (ref 39–117)
BUN: 9 mg/dL (ref 6–23)
CO2: 26 meq/L (ref 19–32)
CREATININE: 0.86 mg/dL (ref 0.50–1.35)
Calcium: 9 mg/dL (ref 8.4–10.5)
Chloride: 101 mEq/L (ref 96–112)
GFR calc Af Amer: >90 mL/min (ref >90)
Glucose, Bld: 111 mg/dL — ABNORMAL HIGH (ref 70–99)
POTASSIUM: 4.7 meq/L (ref 3.7–5.3)
Sodium: 137 mEq/L (ref 137–147)
Total Bilirubin: 0.3 mg/dL (ref 0.3–1.2)
Total Protein: 6.4 g/dL (ref 6.0–8.3)

## 2013-10-23 LAB — SEDIMENTATION RATE: SED RATE: 2 mm/h (ref 0–16)

## 2013-10-23 LAB — TROPONIN I: Troponin I: <0.3 ng/mL (ref <0.30)

## 2013-10-23 MED ORDER — NAPROXEN 500 MG PO TABS: 500.0000 mg | ORAL_TABLET | Freq: Two times a day (BID) | ORAL | Status: DC

## 2013-10-23 MED ORDER — IBUPROFEN 800 MG PO TABS
800.0000 mg | ORAL_TABLET | Freq: Once | ORAL | Status: AC
  Administered 2013-10-23: 800 mg via ORAL
  Filled 2013-10-23: qty 1

## 2013-10-23 MED ORDER — CYCLOBENZAPRINE HCL 10 MG PO TABS
10.0000 mg | ORAL_TABLET | Freq: Once | ORAL | Status: AC
  Administered 2013-10-23: 10 mg via ORAL
  Filled 2013-10-23: qty 1

## 2013-10-23 MED ORDER — CYCLOBENZAPRINE HCL 10 MG PO TABS: 10.0000 mg | ORAL_TABLET | Freq: Three times a day (TID) | ORAL | Status: DC | PRN

## 2013-10-23 NOTE — ED Provider Notes (Signed)
CSN: [id number]     Arrival date & time 10/23/13  1014 History   First MD Initiated Contact with Patient 10/23/13 1029     Chief Complaint  Patient presents with  . Chest Pain  . Headache   (Consider location/radiation/quality/duration/timing/severity/associated sxs/prior Treatment) HPI Patient reports he has been having chest pain off and on for the past 7 days. He states the pain comes and goes and only lasts a few seconds. He indicates it's in his left upper chest and he describes it as sharp. He states it got constant the past 3 days. He denies nausea or vomiting. He has had a cough with yellow/green sputum. He denies fever but states she's having sweats at night and  he feels hot. He states when he takes a big deep breath it makes him feel short of breath. He denies any wheezing. He states he had chest pain like this 5-6 years ago and was seen at [facility] and had echocardiogram and a stress test done. He states he's unable to sleep now because of the pain. He states deep breathing makes the pain worse. Laying down and being still makes it better. It also feels better if he puts pressure to the area or massages his chest.  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 He was told it could turned and could return inside his brain. He states he's having a sharp pain in his left temple off and on for the past one to 2 weeks that last one to 2 hours. He described it as an ache. He denies any numbness or tingling of his extremities. He denies any blurred vision or change in his vision. He denies any head trauma. Patient wants to get a CT done today.  Family history. Patient states his father had heart problems and died at age 26. He states his maternal grandmother had several MIs and died of a heart problem.  PCP [place]  Dr Sheral Apley Orthopedist Dr Marlou Sa  Past Medical History  Diagnosis Date  . Bipolar disorder   . Eczema    . Low back pain   . Elevated blood pressure reading without diagnosis of hypertension   . Abdominal wall hernia   . Poor circulation   . Arthritis   . Hypertension   . Anginal pain   . Pneumonia ~ 10/2011  . Umbilical hernia     "needs to be repaired"  . Generalized headaches     "just about every day"  . Anxiety   . Depression   . Schizoaffective disorder    Past Surgical History  Procedure Laterality Date  . Lipoma excision  ~ 2010    "back left side of my head"   Family History  Problem Relation Age of Onset  . Hypertension Mother   . Cancer Mother   . Heart disease Father   . Heart attack Father 25  . Hyperlipidemia Father   . Hypertension Father   . Diabetes Sister   . Diabetes Brother    History  Substance Use Topics  . Smoking status: Current Some Day Smoker -- 1.00 packs/day for 30 years    Types: Cigarettes  . Smokeless tobacco: Never Used  . Alcohol Use: No     Comment: occasionally has a drink every couple of months  on disability for a torn ligament in his leg is going to have surgery by Dr Marlou Sa soon Denies cocaine  since early last year States he smokes 1 pp week   Review of Systems  All other systems reviewed and are negative.    Allergies  Penicillins  Home Medications   Current Outpatient Rx  Name  Route  Sig  Dispense  Refill  . pantoprazole (PROTONIX) 40 MG tablet   Oral   Take 1 tablet (40 mg total) by mouth daily.   30 tablet   5   . simvastatin (ZOCOR) 40 MG tablet   Oral   Take 1 tablet (40 mg total) by mouth at bedtime.   30 tablet   11   . traMADol (ULTRAM) 50 MG tablet   Oral   Take 50 mg by mouth every 6 (six) hours as needed for moderate pain (every 8 hrs).          BP 110/84  Pulse 72  Temp(Src) 97.4 F (36.3 C) (Oral)  Resp 20  SpO2 98%  Vital signs normal   Physical Exam  Nursing note and vitals reviewed. Constitutional: He is oriented to person, place, and time. He appears well-developed and  well-nourished.  Non-toxic appearance. He does not appear ill. No distress.  HENT:  Head: Normocephalic and atraumatic.    Right Ear: External ear normal.  Left Ear: External ear normal.  Nose: Nose normal. No mucosal edema or rhinorrhea.  Mouth/Throat: Oropharynx is clear and moist and mucous membranes are normal. No dental abscesses or uvula swelling.  Tender in his left temple where he states he has had pain, no masses felt.  Patient has a scar in his posterior scalp where he had his prior lipoma resected.  Eyes: Conjunctivae and EOM are normal. Pupils are equal, round, and reactive to light.  Neck: Normal range of motion and full passive range of motion without pain. Neck supple.  Cardiovascular: Normal rate, regular rhythm and normal heart sounds.  Exam reveals no gallop and no friction rub.   No murmur heard. Pulmonary/Chest: Effort normal and breath sounds normal. No respiratory distress. He has no wheezes. He has no rhonchi. He has no rales. He exhibits tenderness. He exhibits no crepitus.    Tender in his left upper lateral chest wall in the muscle mass that helps move the arm.  Abdominal: Soft. Normal appearance and bowel sounds are normal. He exhibits no distension. There is no tenderness. There is no rebound and no guarding.  Musculoskeletal: Normal range of motion. He exhibits no edema and no tenderness.  Moves all extremities well.   Neurological: He is alert and oriented to person, place, and time. He has normal strength. No cranial nerve deficit.  Skin: Skin is warm, dry and intact. No rash noted. No erythema. No pallor.  Psychiatric: He has a normal mood and affect. His speech is normal and behavior is normal. His mood appears not anxious.    ED Course  Procedures (including critical care time)  Medications  ibuprofen (ADVIL,MOTRIN) tablet 800 mg (800 mg Oral Given 10/23/13 1121)  cyclobenzaprine (FLEXERIL) tablet 10 mg (10 mg Oral Given 10/23/13 1121)    Patient  has been in no distress during his ED visit. At this time I do not feel head CT indicated. He can followup with his PCP to discuss this if he continues to have headaches. Patient has a psychiatric history he seems to be concerned about possible recurrence of lipoma that was removed from his scalp several years ago. He has no other neurological symptoms. His sedimentation rate is normal at 2 with  no indication of temporal arteritis. He will be discharged on nonsteroidal anti-inflammatory drugs and muscle laxer for his chest wall pain.  Labs Review Results for orders placed during the hospital encounter of 10/23/13  SEDIMENTATION RATE      Result Value Range   Sed Rate 2  0 - 16 mm/hr  CBC WITH DIFFERENTIAL      Result Value Range   WBC 5.8  4.0 - 10.5 K/uL   RBC 5.18  4.22 - 5.81 MIL/uL   Hemoglobin 16.8  13.0 - 17.0 g/dL   HCT 48.4  39.0 - 52.0 %   MCV 93.4  78.0 - 100.0 fL   MCH 32.4  26.0 - 34.0 pg   MCHC 34.7  30.0 - 36.0 g/dL   RDW 13.0  11.5 - 15.5 %   Platelets 298  150 - 400 K/uL   Neutrophils Relative % 52  43 - 77 %   Neutro Abs 3.1  1.7 - 7.7 K/uL   Lymphocytes Relative 38  12 - 46 %   Lymphs Abs 2.2  0.7 - 4.0 K/uL   Monocytes Relative 7  3 - 12 %   Monocytes Absolute 0.4  0.1 - 1.0 K/uL   Eosinophils Relative 2  0 - 5 %   Eosinophils Absolute 0.1  0.0 - 0.7 K/uL   Basophils Relative 0  0 - 1 %   Basophils Absolute 0.0  0.0 - 0.1 K/uL  COMPREHENSIVE METABOLIC PANEL      Result Value Range   Sodium 137  137 - 147 mEq/L   Potassium 4.7  3.7 - 5.3 mEq/L   Chloride 101  96 - 112 mEq/L   CO2 26  19 - 32 mEq/L   Glucose, Bld 111 (*) 70 - 99 mg/dL   BUN 9  6 - 23 mg/dL   Creatinine, Ser 0.86  0.50 - 1.35 mg/dL   Calcium 9.0  8.4 - 10.5 mg/dL   Total Protein 6.4  6.0 - 8.3 g/dL   Albumin 3.2 (*) 3.5 - 5.2 g/dL   AST 17  0 - 37 U/L   ALT 17  0 - 53 U/L   Alkaline Phosphatase 74  39 - 117 U/L   Total Bilirubin 0.3  0.3 - 1.2 mg/dL   GFR calc non Af Amer >90  >90 mL/min    GFR calc Af Amer >90  >90 mL/min  TROPONIN I      Result Value Range   Troponin I <0.30  <0.30 ng/mL   Laboratory interpretation all normal    Imaging Review Dg Chest 2 View  10/23/2013   CLINICAL DATA:  Left-sided chest pressure for 3 days, cough and congestion  EXAM: CHEST  2 VIEW  COMPARISON:  07/17/2012  FINDINGS: Mild left and moderate right apical bullous change. Heart size and vascular pattern normal. No consolidation or effusion. No change from prior study.  IMPRESSION: Emphysematous change.  No acute findings.   Electronically Signed   By: Skipper Cliche M.D.   On: 10/23/2013 11:40    EKG Interpretation    Date/Time:  Saturday October 23 2013 10:18:41 EST Ventricular Rate:  74 PR Interval:  150 QRS Duration: 82 QT Interval:  354 QTC Calculation: 392 R Axis:   33 Text Interpretation:  Normal sinus rhythm Septal infarct , age undetermined No significant change since last tracing 23 Sep 2013 Confirmed by [facility]  MD-I, Juanluis Guastella ([id number]) on 10/23/2013 11:59:00 AM  MDM   1. Chest wall pain   2. Left temporal headache       Discharge Medication List as of 10/23/2013 12:50 PM    START taking these medications   Details  cyclobenzaprine (FLEXERIL) 10 MG tablet Take 1 tablet (10 mg total) by mouth 3 (three) times daily as needed for muscle spasms., Starting 10/23/2013, Until Discontinued, Print    naproxen (NAPROSYN) 500 MG tablet Take 1 tablet (500 mg total) by mouth 2 (two) times daily with a meal., Starting 10/23/2013, Until Discontinued, Print        Plan discharge  Rolland Porter, MD, Alanson Aly, MD 10/23/13 [phone]

## 2013-10-23 NOTE — Discharge Instructions (Signed)
Try ice and heat to your chest and your head. Take the medications as prescribed which should help both of your pains. If you continue to have pain you can be seen at the [facility] and they can decide if you need a CT scan of your head done at that time. You need to stop smoking, you have changes in your lung from the smoking you have been doing.

## 2013-10-23 NOTE — ED Notes (Signed)
Patient transported to X-ray 

## 2013-10-23 NOTE — ED Notes (Signed)
Pt placed back on cardiac monitor. Vital signs stable. Pt resting quietly at present. No signs of distress noted.

## 2013-10-23 NOTE — ED Notes (Signed)
Back from xray

## 2013-10-23 NOTE — ED Notes (Signed)
Patient discharged using teach back method verbalizes an understanding

## 2013-10-23 NOTE — ED Notes (Signed)
Pt c/o intermittent sharp L CP x 3 days.  Pt also c/o headache.

## 2013-10-25 ENCOUNTER — Ambulatory Visit (INDEPENDENT_AMBULATORY_CARE_PROVIDER_SITE_OTHER): Payer: Medicare Other | Admitting: Family Medicine

## 2013-10-25 ENCOUNTER — Encounter: Payer: Self-pay | Admitting: Family Medicine

## 2013-10-25 VITALS — BP 125/83 | HR 78 | Temp 97.5°F | Wt 215.0 lb

## 2013-10-25 DIAGNOSIS — F528 Other sexual dysfunction not due to a substance or known physiological condition: Secondary | ICD-10-CM

## 2013-10-25 DIAGNOSIS — R37 Sexual dysfunction, unspecified: Secondary | ICD-10-CM

## 2013-10-25 DIAGNOSIS — R51 Headache: Secondary | ICD-10-CM

## 2013-10-25 DIAGNOSIS — E7800 Pure hypercholesterolemia, unspecified: Secondary | ICD-10-CM

## 2013-10-25 DIAGNOSIS — F529 Unspecified sexual dysfunction not due to a substance or known physiological condition: Secondary | ICD-10-CM

## 2013-10-25 DIAGNOSIS — F17200 Nicotine dependence, unspecified, uncomplicated: Secondary | ICD-10-CM | POA: Diagnosis not present

## 2013-10-25 MED ORDER — EZETIMIBE 10 MG PO TABS: 10.0000 mg | ORAL_TABLET | Freq: Every day | ORAL | Status: DC

## 2013-10-25 MED ORDER — VARENICLINE TARTRATE 0.5 MG X 11 & 1 MG X 42 PO MISC: ORAL | Status: DC

## 2013-10-25 NOTE — Assessment & Plan Note (Signed)
Would like to switch from Zocor to Zetia since he states it helped his reflux as well. New Rx sent to pharmacy. D/c Zocor

## 2013-10-25 NOTE — Patient Instructions (Signed)
Stop taking the Zocor, and start Zetia. I have sent this to the pharmacy but if Medicaid needs approval, it may be a few days to get it filled.  Stop smoking! I have given you a written prescription for Chantix. Take as directed. Follow up with me in one month.  Bralin Garry M. Jihan Rudy, M.D.

## 2013-10-25 NOTE — Assessment & Plan Note (Signed)
Offered Rx for Viagra today which he states he cannot afford. Suggested seeing urologist if he feels this is necessary and he seems unsure if he wants to do that. I have placed referral and he can cancel the appointment if he would like.

## 2013-10-25 NOTE — Assessment & Plan Note (Signed)
Seems ready to quit. Declined health coaching and 1-800 quit line. Will prescribe Chantix starter pack, but patient advised of negative side effects to be aware of.

## 2013-10-25 NOTE — Assessment & Plan Note (Signed)
Con't current Tramadol and f/u as needed.

## 2013-10-25 NOTE — Progress Notes (Signed)
Patient ID: Andrew [name], male   DOB: [birth year], 48 y.o.   MRN: [id number]    Subjective: HPI: Patient is a 48 y.o. male presenting to clinic today for follow up appointment. Concerns today include headaches and sex life  1. Cholesterol - Patient started on Zocor recently. He states he took someone's Zetia recently and that helped with his reflux as well. He would like to switch to Zetia since he feels it worked better and made him feel better. States he has changed his diet and cutting back on fried foods. Increased veggies.   2. Headaches- Seen in ED. States his headache is around his temple. No vision changes or neurologic defect. EDP did not feel CT scan was needed, and he did not mention that today.  3. Tobacco cessation- Smokes 1/4ppd (down from 1ppd.) Went to the ED for chest pain, was told he has emphysematous changes and was told to stop smoking. He is still smoking some but is ready to stop. Would like to try Chantix. Would like to quit today.   4. Impotence - Decreased libido and decreased erections. Reports some nighttime erections, but not as much. 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  History Reviewed: Everyday smoker. Health Maintenance: UTD  ROS: Please see HPI above.  Objective: Office vital signs reviewed. BP 125/83  Pulse 78  Temp(Src) 97.5 F (36.4 C) (Oral)  Wt 215 lb (97.523 kg)  Physical Examination:  General: Awake, alert. NAD HEENT: Atraumatic, normocephalic Neck: No masses palpated. No LAD Pulm: CTAB, no wheezes Cardio: RRR, no murmurs appreciated Abdomen:+BS, soft, nontender, nondistended Extremities: No edema Neuro: Grossly intact  Assessment: 48 y.o. male follow up appointment  Plan: See Problem List and After Visit Summary

## 2013-11-24 ENCOUNTER — Ambulatory Visit: Payer: Medicare Other | Admitting: Family Medicine

## 2013-11-25 DIAGNOSIS — F259 Schizoaffective disorder, unspecified: Secondary | ICD-10-CM | POA: Diagnosis not present

## 2013-12-09 ENCOUNTER — Ambulatory Visit (INDEPENDENT_AMBULATORY_CARE_PROVIDER_SITE_OTHER): Payer: Medicare Other | Admitting: Family Medicine

## 2013-12-09 ENCOUNTER — Encounter: Payer: Self-pay | Admitting: Family Medicine

## 2013-12-09 ENCOUNTER — Telehealth: Payer: Self-pay | Admitting: Family Medicine

## 2013-12-09 VITALS — BP 129/80 | HR 75 | Temp 98.6°F | Wt 217.0 lb

## 2013-12-09 DIAGNOSIS — R030 Elevated blood-pressure reading, without diagnosis of hypertension: Secondary | ICD-10-CM | POA: Diagnosis not present

## 2013-12-09 DIAGNOSIS — R300 Dysuria: Secondary | ICD-10-CM | POA: Diagnosis not present

## 2013-12-09 DIAGNOSIS — F319 Bipolar disorder, unspecified: Secondary | ICD-10-CM

## 2013-12-09 DIAGNOSIS — Z79899 Other long term (current) drug therapy: Secondary | ICD-10-CM

## 2013-12-09 DIAGNOSIS — K219 Gastro-esophageal reflux disease without esophagitis: Secondary | ICD-10-CM

## 2013-12-09 DIAGNOSIS — F17200 Nicotine dependence, unspecified, uncomplicated: Secondary | ICD-10-CM

## 2013-12-09 LAB — POCT URINALYSIS DIPSTICK
Bilirubin, UA: NEGATIVE
Blood, UA: NEGATIVE
Glucose, UA: NEGATIVE
Ketones, UA: NEGATIVE
Nitrite, UA: NEGATIVE
PROTEIN UA: NEGATIVE
Spec Grav, UA: 1.015
Urobilinogen, UA: 0.2
pH, UA: 7

## 2013-12-09 LAB — POCT H PYLORI SCREEN: H PYLORI SCREEN, POC: NEGATIVE

## 2013-12-09 LAB — POCT UA - MICROSCOPIC ONLY

## 2013-12-09 LAB — POCT GLYCOSYLATED HEMOGLOBIN (HGB A1C): Hemoglobin A1C: 6.1

## 2013-12-09 MED ORDER — NICOTINE 14 MG/24HR TD PT24: 14.0000 mg | MEDICATED_PATCH | Freq: Every day | TRANSDERMAL | Status: DC

## 2013-12-09 MED ORDER — HYDROCHLOROTHIAZIDE 12.5 MG PO CAPS: 12.5000 mg | ORAL_CAPSULE | Freq: Every day | ORAL | Status: DC

## 2013-12-09 MED ORDER — METRONIDAZOLE 500 MG PO TABS: 2000.0000 mg | ORAL_TABLET | Freq: Once | ORAL | Status: DC

## 2013-12-09 MED ORDER — TRAMADOL HCL 50 MG PO TABS: 50.0000 mg | ORAL_TABLET | Freq: Four times a day (QID) | ORAL | Status: DC | PRN

## 2013-12-09 NOTE — Assessment & Plan Note (Signed)
UA neg for UTI, but positive for trich. Flagyl sent to pharmacy, patient notified.

## 2013-12-09 NOTE — Assessment & Plan Note (Signed)
Managed by [employer]. Labs ordered today. Will fax to [phone]

## 2013-12-09 NOTE — Assessment & Plan Note (Signed)
BP at goal but he is concerned about times of stress when he does not feel well. This has been an issue in the past as well. Given his concern and elevated BP previously, will try him on a low dose HCTZ to see how he does. F/u BP in 1 month.

## 2013-12-09 NOTE — Assessment & Plan Note (Signed)
Doing well off PPI. Will check H.pylori today.

## 2013-12-09 NOTE — Telephone Encounter (Signed)
Called patient and left message to call our office back.  Nolene Ebbs, RN

## 2013-12-09 NOTE — Assessment & Plan Note (Signed)
Will try on nicotine patches. Patient seems to be motivated. Will con't to monitor.

## 2013-12-09 NOTE — Progress Notes (Signed)
Patient ID: Andrew [name], male   DOB: 07/30/1966, 48 y.o.   MRN: [id number]    Subjective: HPI: Patient is a 48 y.o. male presenting to clinic today for follow up appointment. Concerns today include blood pressure, H. pylori  1. Hypertension: Does not handle stress well. Feels his blood pressure is high when he is stressed. Feels swollen and has headaches. Checks BP at home and bottom number is in the 90's. Feels better once he calms down.  Blood pressure today: 129/80 Taking Meds: None ROS: Denies headache, visual changes, nausea, vomiting, chest pain, abdominal pain or shortness of breath. Interested in starting a low dose medication.  2. Bipolar: Patient has history of bipolar managed by [facility]. Beverly Sessions has requested labs to be done and faxed to [phone]. He is on Valproic acid now but states he does not like it. I have encouraged him to NOT stop the medicine and to call [facility] with any questions.   3. Dysuria: Patient reports some dysuria. Denies fevers, frequency, urgency. He does state he has bilateral flank pain. No blood. No penis discharge.  4. Tobacco Dependence: As discussed before, he has some CXR changes that will only improve with smoking cessation. Encouraged to stop smoking. States Chantix did not work for him. Would like to try the nicotine patch.  5. GERD: Was told by a family member that he may have H.pylori and it could cause cancer. Right now, his symptoms are controlled off of Prilosec. He would like to be checked  History Reviewed: Daily smoker. Health Maintenance: Needs Tdap  ROS: Please see HPI above.  Objective: Office vital signs reviewed. BP 129/80  Pulse 75  Temp(Src) 98.6 F (37 C) (Oral)  Wt 217 lb (98.431 kg)  Physical Examination:  General: Awake, alert. NAD HEENT: Atraumatic, normocephalic. Missing multiple teeth Pulm: CTAB, no wheezes Cardio: RRR, no murmurs appreciated Abdomen:+BS, soft, nontender, nondistended. ?CVA  tenderness Extremities: No edema Neuro: Grossly intact  Assessment: 48 y.o. male follow up  Plan: See Problem List and After Visit Summary

## 2013-12-09 NOTE — Patient Instructions (Signed)
I will call you with any abnormal results. I will fax your labs to [facility] for you.  Take one HCTZ every morning for your blood pressure.  I will see you back in one month.  Joely Losier M. [place] Luppino, M.D.

## 2013-12-09 NOTE — Telephone Encounter (Signed)
Please let patient know that his H.pylori was negative.  His urine did not show an infection in his bladder or his kidneys. It did show that he had trichomonas. He needs to take an antibiotic for this (Flagyl 2g x1) which I will send to the pharmacy. His sexual partner should also get treatment and they should obtain from intercourse for at least 72 hours.  Thank you, Amber M. Hairford, M.D.

## 2013-12-10 LAB — CBC WITH DIFFERENTIAL/PLATELET
Basophils Absolute: 0 10*3/uL (ref 0.0–0.1)
Basophils Relative: 0 % (ref 0–1)
EOS ABS: 0.1 10*3/uL (ref 0.0–0.7)
Eosinophils Relative: 2 % (ref 0–5)
HEMATOCRIT: 47.2 % (ref 39.0–52.0)
HEMOGLOBIN: 15.7 g/dL (ref 13.0–17.0)
LYMPHS ABS: 2.5 10*3/uL (ref 0.7–4.0)
LYMPHS PCT: 44 % (ref 12–46)
MCH: 30.7 pg (ref 26.0–34.0)
MCHC: 33.3 g/dL (ref 30.0–36.0)
MCV: 92.4 fL (ref 78.0–100.0)
MONO ABS: 0.6 10*3/uL (ref 0.1–1.0)
Monocytes Relative: 11 % (ref 3–12)
Neutro Abs: 2.5 10*3/uL (ref 1.7–7.7)
Neutrophils Relative %: 43 % (ref 43–77)
PLATELETS: 262 10*3/uL (ref 150–400)
RBC: 5.11 MIL/uL (ref 4.22–5.81)
RDW: 13.3 % (ref 11.5–15.5)
WBC: 5.7 10*3/uL (ref 4.0–10.5)

## 2013-12-10 LAB — COMPREHENSIVE METABOLIC PANEL
ALK PHOS: 64 U/L (ref 39–117)
ALT: 22 U/L (ref 0–53)
AST: 22 U/L (ref 0–37)
Albumin: 3.7 g/dL (ref 3.5–5.2)
BILIRUBIN TOTAL: 0.4 mg/dL (ref 0.2–1.2)
BUN: 8 mg/dL (ref 6–23)
CO2: 29 mEq/L (ref 19–32)
Calcium: 9.2 mg/dL (ref 8.4–10.5)
Chloride: 106 mEq/L (ref 96–112)
Creat: 0.85 mg/dL (ref 0.50–1.35)
Glucose, Bld: 107 mg/dL — ABNORMAL HIGH (ref 70–99)
Potassium: 4.4 mEq/L (ref 3.5–5.3)
SODIUM: 139 meq/L (ref 135–145)
TOTAL PROTEIN: 6 g/dL (ref 6.0–8.3)

## 2013-12-10 LAB — VALPROIC ACID LEVEL: Valproic Acid Lvl: 1 ug/mL — ABNORMAL LOW (ref 50.0–100.0)

## 2013-12-10 NOTE — Telephone Encounter (Signed)
Called patient and informed of test results.  Patient will pick up Rx and notify partner that she needs to be tested/treated for trichomonas.  Will also need to abstain from intercourse for at least 72 hours & patient verbalized understanding.  Burna Forts, BSN, RN-BC

## 2013-12-21 ENCOUNTER — Telehealth: Payer: Self-pay | Admitting: Family Medicine

## 2013-12-21 NOTE — Telephone Encounter (Signed)
Can you please print off Mr. Brisendine last labs and fax to [facility] at [phone]?  Thank you! Tabita Corbo M. Charae Depaolis, M.D.

## 2013-12-29 ENCOUNTER — Emergency Department (HOSPITAL_COMMUNITY)
Admission: EM | Admit: 2013-12-29 | Discharge: 2013-12-30 | Disposition: A | Discharge status: Home / Self Care | Payer: Medicare Other | Attending: Emergency Medicine | Admitting: Emergency Medicine

## 2013-12-29 ENCOUNTER — Encounter (HOSPITAL_COMMUNITY): Payer: Self-pay | Admitting: Emergency Medicine

## 2013-12-29 ENCOUNTER — Emergency Department (HOSPITAL_COMMUNITY): Payer: Medicare Other

## 2013-12-29 DIAGNOSIS — M129 Arthropathy, unspecified: Secondary | ICD-10-CM | POA: Insufficient documentation

## 2013-12-29 DIAGNOSIS — F319 Bipolar disorder, unspecified: Secondary | ICD-10-CM | POA: Insufficient documentation

## 2013-12-29 DIAGNOSIS — Z8719 Personal history of other diseases of the digestive system: Secondary | ICD-10-CM | POA: Insufficient documentation

## 2013-12-29 DIAGNOSIS — Z79899 Other long term (current) drug therapy: Secondary | ICD-10-CM | POA: Insufficient documentation

## 2013-12-29 DIAGNOSIS — Z8701 Personal history of pneumonia (recurrent): Secondary | ICD-10-CM | POA: Insufficient documentation

## 2013-12-29 DIAGNOSIS — Z791 Long term (current) use of non-steroidal anti-inflammatories (NSAID): Secondary | ICD-10-CM | POA: Insufficient documentation

## 2013-12-29 DIAGNOSIS — J209 Acute bronchitis, unspecified: Secondary | ICD-10-CM | POA: Diagnosis not present

## 2013-12-29 DIAGNOSIS — I10 Essential (primary) hypertension: Secondary | ICD-10-CM | POA: Insufficient documentation

## 2013-12-29 DIAGNOSIS — F411 Generalized anxiety disorder: Secondary | ICD-10-CM | POA: Insufficient documentation

## 2013-12-29 DIAGNOSIS — Z872 Personal history of diseases of the skin and subcutaneous tissue: Secondary | ICD-10-CM | POA: Insufficient documentation

## 2013-12-29 DIAGNOSIS — I209 Angina pectoris, unspecified: Secondary | ICD-10-CM | POA: Diagnosis not present

## 2013-12-29 DIAGNOSIS — J440 Chronic obstructive pulmonary disease with acute lower respiratory infection: Secondary | ICD-10-CM | POA: Insufficient documentation

## 2013-12-29 DIAGNOSIS — R0789 Other chest pain: Secondary | ICD-10-CM | POA: Diagnosis not present

## 2013-12-29 DIAGNOSIS — Z880 Allergy status to penicillin: Secondary | ICD-10-CM | POA: Insufficient documentation

## 2013-12-29 DIAGNOSIS — Z8659 Personal history of other mental and behavioral disorders: Secondary | ICD-10-CM | POA: Insufficient documentation

## 2013-12-29 DIAGNOSIS — F17200 Nicotine dependence, unspecified, uncomplicated: Secondary | ICD-10-CM | POA: Diagnosis not present

## 2013-12-29 DIAGNOSIS — J438 Other emphysema: Secondary | ICD-10-CM | POA: Diagnosis not present

## 2013-12-29 DIAGNOSIS — J40 Bronchitis, not specified as acute or chronic: Secondary | ICD-10-CM

## 2013-12-29 LAB — I-STAT TROPONIN, ED: TROPONIN I, POC: 0 ng/mL (ref 0.00–0.08)

## 2013-12-29 LAB — BASIC METABOLIC PANEL
BUN: 10 mg/dL (ref 6–23)
CALCIUM: 9.2 mg/dL (ref 8.4–10.5)
CO2: 22 meq/L (ref 19–32)
Chloride: 105 mEq/L (ref 96–112)
Creatinine, Ser: 0.84 mg/dL (ref 0.50–1.35)
GFR calc Af Amer: >90 mL/min (ref >90)
GFR calc non Af Amer: >90 mL/min (ref >90)
Glucose, Bld: 164 mg/dL — ABNORMAL HIGH (ref 70–99)
POTASSIUM: 4 meq/L (ref 3.7–5.3)
SODIUM: 140 meq/L (ref 137–147)

## 2013-12-29 LAB — CBC
HCT: 46.6 % (ref 39.0–52.0)
Hemoglobin: 16.3 g/dL (ref 13.0–17.0)
MCH: 31.5 pg (ref 26.0–34.0)
MCHC: 35 g/dL (ref 30.0–36.0)
MCV: 90.1 fL (ref 78.0–100.0)
PLATELETS: 340 10*3/uL (ref 150–400)
RBC: 5.17 MIL/uL (ref 4.22–5.81)
RDW: 13.1 % (ref 11.5–15.5)
WBC: 8.6 10*3/uL (ref 4.0–10.5)

## 2013-12-29 LAB — PRO B NATRIURETIC PEPTIDE: PRO B NATRI PEPTIDE: 48.3 pg/mL (ref 0–125)

## 2013-12-29 NOTE — ED Notes (Signed)
Patient with history of congestion, chest pain and shortness of breath for last two weeks.  Patients states that he also is having some dizziness along with the congestion.

## 2013-12-30 MED ORDER — AMOXICILLIN 500 MG PO CAPS: 1000.0000 mg | ORAL_CAPSULE | Freq: Two times a day (BID) | ORAL | Status: DC

## 2013-12-30 NOTE — ED Provider Notes (Signed)
CSN: [id number]     Arrival date & time 12/29/13  1904 History   First MD Initiated Contact with Patient 12/29/13 2311     Chief Complaint  Patient presents with  . Chest Pain  . Shortness of Breath  . Nasal Congestion     (Consider location/radiation/quality/duration/timing/severity/associated sxs/prior Treatment) Patient is a 48 y.o. male presenting with chest pain and shortness of breath. The history is provided by the patient. No language interpreter was used.  Chest Pain Pain location:  L chest and R chest Associated symptoms: cough and shortness of breath   Associated symptoms: no fever, no nausea and not vomiting   Associated symptoms comment:  Cough, congestion, chest discomfort without fever for the past 2 weeks. He denies nausea or vomiting. He reports he is cutting down significantly on smoking but continues to smoke 1/2 pack per week.  Shortness of Breath Associated symptoms: chest pain, cough and sore throat   Associated symptoms: no fever and no vomiting     Past Medical History  Diagnosis Date  . Bipolar disorder   . Eczema   . Low back pain   . Elevated blood pressure reading without diagnosis of hypertension   . Abdominal wall hernia   . Poor circulation   . Arthritis   . Hypertension   . Anginal pain   . Pneumonia ~ 10/2011  . Umbilical hernia     "needs to be repaired"  . Generalized headaches     "just about every day"  . Anxiety   . Depression   . Schizoaffective disorder    Past Surgical History  Procedure Laterality Date  . Lipoma excision  ~ 2010    "back left side of my head"   Family History  Problem Relation Age of Onset  . Hypertension Mother   . Cancer Mother   . Heart disease Father   . Heart attack Father 63  . Hyperlipidemia Father   . Hypertension Father   . Diabetes Sister   . Diabetes Brother    History  Substance Use Topics  . Smoking status: Current Some Day Smoker -- 1.00 packs/day for 30 years    Types: Cigarettes  .  Smokeless tobacco: Never Used  . Alcohol Use: No     Comment: occasionally has a drink every couple of months    Review of Systems  Constitutional: Negative for fever and chills.  HENT: Positive for congestion and sore throat. Negative for rhinorrhea.   Respiratory: Positive for cough and shortness of breath.   Cardiovascular: Positive for chest pain.       Chest pain with cough.  Gastrointestinal: Negative.  Negative for nausea and vomiting.  Musculoskeletal: Positive for myalgias.  Skin: Negative.   Neurological: Negative.       Allergies  Penicillins  Home Medications   Current Outpatient Rx  Name  Route  Sig  Dispense  Refill  . cyclobenzaprine (FLEXERIL) 10 MG tablet   Oral   Take 1 tablet (10 mg total) by mouth 3 (three) times daily as needed for muscle spasms.   30 tablet   0   . ezetimibe (ZETIA) 10 MG tablet   Oral   Take 1 tablet (10 mg total) by mouth daily.   30 tablet   11   . hydrochlorothiazide (MICROZIDE) 12.5 MG capsule   Oral   Take 1 capsule (12.5 mg total) by mouth daily.   30 capsule   11   . metroNIDAZOLE (FLAGYL) 500 MG  tablet   Oral   Take 4 tablets (2,000 mg total) by mouth once.   4 tablet   0   . naproxen (NAPROSYN) 500 MG tablet   Oral   Take 1 tablet (500 mg total) by mouth 2 (two) times daily with a meal.   30 tablet   0   . nicotine (NICODERM CQ - DOSED IN MG/24 HOURS) 14 mg/24hr patch   Transdermal   Place 1 patch (14 mg total) onto the skin daily.   28 patch   0   . traMADol (ULTRAM) 50 MG tablet   Oral   Take 1 tablet (50 mg total) by mouth every 6 (six) hours as needed for moderate pain (every 8 hrs).   30 tablet   0   . varenicline (CHANTIX STARTING MONTH PAK) 0.5 MG X 11 & 1 MG X 42 tablet      Take one 0.5 mg tablet by mouth once daily for 3 days, then increase to one 0.5 mg tablet twice daily for 4 days, then increase to one 1 mg tablet twice daily.   53 tablet   0    BP 127/77  Pulse 82  Temp(Src)  98.5 F (36.9 C) (Oral)  Resp 14  Wt 216 lb (97.977 kg)  SpO2 96% Physical Exam  Constitutional: He is oriented to person, place, and time. He appears well-developed and well-nourished.  HENT:  Head: Normocephalic.  Right Ear: External ear normal.  Left Ear: External ear normal.  Nose: Mucosal edema present. Right sinus exhibits frontal sinus tenderness. Left sinus exhibits frontal sinus tenderness.  Mouth/Throat: Oropharynx is clear and moist.  Neck: Normal range of motion. Neck supple.  Cardiovascular: Normal rate and normal heart sounds.   No murmur heard. Pulmonary/Chest: Effort normal and breath sounds normal. He has no wheezes. He has no rales.  Abdominal: Soft. Bowel sounds are normal. He exhibits no distension. There is no tenderness.  Musculoskeletal: Normal range of motion.  Lymphadenopathy:    He has no cervical adenopathy.  Neurological: He is oriented to person, place, and time.  Skin: Skin is warm and dry. No pallor.    ED Course  Procedures (including critical care time) Labs Review Labs Reviewed  BASIC METABOLIC PANEL - Abnormal; Notable for the following:    Glucose, Bld 164 (*)    All other components within normal limits  CBC  PRO B NATRIURETIC PEPTIDE  I-STAT TROPOININ, ED   Imaging Review Dg Chest 2 View  12/29/2013   CLINICAL DATA:  CHEST PAIN SHORTNESS OF BREATH NASAL CONGESTION  EXAM: CHEST  2 VIEW  COMPARISON:  DG CHEST 2 VIEW dated 10/23/2013  FINDINGS: Cardiac silhouette is within normal limits. Stable emphysematous changes within the lung apices. No focal regions of consolidation or focal infiltrates. Osseous structures unremarkable.  IMPRESSION: Stable biapical emphysematous changes. No evidence of acute cardiopulmonary disease.   Electronically Signed   By: Margaree Mackintosh M.D.   On: 12/29/2013 20:38     EKG Interpretation None      MDM   Final diagnoses:  None    1. Bronchitis  Uncomplicated bronchitis in a patient with known  emphysema. No compromise to oxygenation, no tachycardia or tachypnea. Unchanged CXR. Abfebrile. Stable for discharge.     Dewaine Oats, PA-C 12/30/13 [phone]

## 2013-12-30 NOTE — Discharge Instructions (Signed)
Bronchitis °Bronchitis is swelling (inflammation) of the air tubes leading to your lungs (bronchi). This causes mucus and a cough. If the swelling gets bad, you may have trouble breathing. °HOME CARE  °· Rest. °· Drink enough fluids to keep your pee (urine) clear or pale yellow (unless you have a condition where you have to watch how much you drink). °· Only take medicine as told by your doctor. If you were given antibiotic medicines, finish them even if you start to feel better. °· Avoid smoke, irritating chemicals, and strong smells. These make the problem worse. Quit smoking if you smoke. This helps your lungs heal faster. °· Use a cool mist humidifier. Change the water in the humidifier every day. You can also sit in the bathroom with hot shower running for 5 10 minutes. Keep the door closed. °· See your health care provider as told. °· Wash your hands often. °GET HELP IF: °Your problems do not get better after 1 week. °GET HELP RIGHT AWAY IF:  °· Your fever gets worse. °· You have chills. °· Your chest hurts. °· Your problems breathing get worse. °· You have blood in your mucus. °· You pass out (faint). °· You feel lightheaded. °· You have a bad headache. °· You throw up (vomit) again and again. °MAKE SURE YOU: °· Understand these instructions. °· Will watch your condition. °· Will get help right away if you are not doing well or get worse. °Document Released: 03/11/2008 Document Revised: 07/14/2013 Document Reviewed: 05/18/2013 °ExitCare® Patient Information ©2014 [employer]. ° °

## 2014-01-02 NOTE — ED Provider Notes (Signed)
Medical screening examination/treatment/procedure(s) were performed by non-physician practitioner and as supervising physician I was immediately available for consultation/collaboration.   EKG Interpretation   Date/Time:  Wednesday December 29 2013 19:15:15 EDT Ventricular Rate:  95 PR Interval:  154 QRS Duration: 82 QT Interval:  332 QTC Calculation: 417 R Axis:   -4 Text Interpretation:  Normal sinus rhythm Septal infarct , age  undetermined Abnormal ECG ED PHYSICIAN INTERPRETATION AVAILABLE IN [place]  Patrick Confirmed by TEST, Record ([id number]) on 12/31/2013 7:41:57 AM        Elyn Peers, MD 01/02/14 [phone]

## 2014-01-05 ENCOUNTER — Ambulatory Visit: Payer: Medicare Other | Admitting: Family Medicine

## 2014-01-06 ENCOUNTER — Ambulatory Visit (INDEPENDENT_AMBULATORY_CARE_PROVIDER_SITE_OTHER): Payer: Medicare Other | Admitting: Family Medicine

## 2014-01-06 ENCOUNTER — Other Ambulatory Visit (HOSPITAL_COMMUNITY)
Admission: RE | Admit: 2014-01-06 | Discharge: 2014-01-06 | Disposition: A | Discharge status: Home / Self Care | Payer: Medicare Other | Source: Ambulatory Visit | Attending: Family Medicine | Admitting: Family Medicine

## 2014-01-06 VITALS — BP 111/69 | HR 69 | Temp 98.2°F | Ht 67.5 in | Wt 217.0 lb

## 2014-01-06 DIAGNOSIS — Z202 Contact with and (suspected) exposure to infections with a predominantly sexual mode of transmission: Secondary | ICD-10-CM

## 2014-01-06 DIAGNOSIS — Z711 Person with feared health complaint in whom no diagnosis is made: Secondary | ICD-10-CM | POA: Diagnosis not present

## 2014-01-06 DIAGNOSIS — Z113 Encounter for screening for infections with a predominantly sexual mode of transmission: Secondary | ICD-10-CM | POA: Insufficient documentation

## 2014-01-06 DIAGNOSIS — A64 Unspecified sexually transmitted disease: Secondary | ICD-10-CM

## 2014-01-06 DIAGNOSIS — M25569 Pain in unspecified knee: Secondary | ICD-10-CM

## 2014-01-06 DIAGNOSIS — M23329 Other meniscus derangements, posterior horn of medial meniscus, unspecified knee: Secondary | ICD-10-CM | POA: Diagnosis not present

## 2014-01-06 DIAGNOSIS — M25561 Pain in right knee: Secondary | ICD-10-CM

## 2014-01-06 MED ORDER — TRAMADOL HCL 50 MG PO TABS: 50.0000 mg | ORAL_TABLET | Freq: Four times a day (QID) | ORAL | Status: DC | PRN

## 2014-01-06 MED ORDER — CEFTRIAXONE SODIUM 1 G IJ SOLR
250.0000 mg | Freq: Once | INTRAMUSCULAR | Status: AC
  Administered 2014-01-06: 250 mg via INTRAMUSCULAR

## 2014-01-06 MED ORDER — AZITHROMYCIN 250 MG PO TABS: 1000.0000 mg | ORAL_TABLET | Freq: Once | ORAL | Status: DC

## 2014-01-06 NOTE — Patient Instructions (Signed)
Take the Azithromycin all the pills at once.    Shot today.  We will call you on Monday with results of the tests.  Referral to Ortho today.

## 2014-01-06 NOTE — Progress Notes (Signed)
Subjective:    Andrew [name] is a 48 y.o. male who presents to [facility] today for 2 issues:  1.  STD check:  Currently with 2 sexual partners.  I have trouble understanding if he or they have been diagnosed with current STD, but according to patient, someone in the relationship has.  Was recently "checked out" about 1 month ago, though no records in our chart.  Desire recheck today.  Denies any symptoms, dysuria, fevers, chills, abdominal/testicular pain, etc.   2.  Knee pain:  Has been seen at Ortho for this.  States he has "torn ligament:" and requires surgery.  Pain on daily basis.  Some swelling.  Wears flexible brace for relief.  Left Ortho's office earlier this PM for separate issue, told he needs separate referral for knee.      ROS as above per HPI, otherwise neg.   The following portions of the patient's history were reviewed and updated as appropriate: allergies, current medications, past medical history, family and social history, and problem list. Patient is a nonsmoker.    PMH reviewed.  Past Medical History  Diagnosis Date  . Bipolar disorder   . Eczema   . Low back pain   . Elevated blood pressure reading without diagnosis of hypertension   . Abdominal wall hernia   . Poor circulation   . Arthritis   . Hypertension   . Anginal pain   . Pneumonia ~ 10/2011  . Umbilical hernia     "needs to be repaired"  . Generalized headaches     "just about every day"  . Anxiety   . Depression   . Schizoaffective disorder    Past Surgical History  Procedure Laterality Date  . Lipoma excision  ~ 2010    "back left side of my head"    Medications reviewed. Current Outpatient Prescriptions  Medication Sig Dispense Refill  . amoxicillin (AMOXIL) 500 MG capsule Take 2 capsules (1,000 mg total) by mouth 2 (two) times daily.  40 capsule  0  . cyclobenzaprine (FLEXERIL) 10 MG tablet Take 1 tablet (10 mg total) by mouth 3 (three) times daily as needed for muscle spasms.  30 tablet  0   . ezetimibe (ZETIA) 10 MG tablet Take 1 tablet (10 mg total) by mouth daily.  30 tablet  11  . hydrochlorothiazide (MICROZIDE) 12.5 MG capsule Take 1 capsule (12.5 mg total) by mouth daily.  30 capsule  11  . metroNIDAZOLE (FLAGYL) 500 MG tablet Take 4 tablets (2,000 mg total) by mouth once.  4 tablet  0  . naproxen (NAPROSYN) 500 MG tablet Take 1 tablet (500 mg total) by mouth 2 (two) times daily with a meal.  30 tablet  0  . nicotine (NICODERM CQ - DOSED IN MG/24 HOURS) 14 mg/24hr patch Place 1 patch (14 mg total) onto the skin daily.  28 patch  0  . traMADol (ULTRAM) 50 MG tablet Take 1 tablet (50 mg total) by mouth every 6 (six) hours as needed for moderate pain (every 8 hrs).  30 tablet  0  . varenicline (CHANTIX STARTING MONTH PAK) 0.5 MG X 11 & 1 MG X 42 tablet Take one 0.5 mg tablet by mouth once daily for 3 days, then increase to one 0.5 mg tablet twice daily for 4 days, then increase to one 1 mg tablet twice daily.  53 tablet  0   No current facility-administered medications for this visit.     Objective:   Physical  Exam BP 111/69  Pulse 69  Temp(Src) 98.2 F (36.8 C) (Oral) Gen:  Alert, cooperative patient who appears stated age in no acute distress.  Vital signs reviewed. Psych:  Pressured speech, though coherent and not tangential.   No results found for this or any previous visit (from the past 72 hour(s)).

## 2014-01-06 NOTE — Assessment & Plan Note (Addendum)
As in HPI, unclear exact history. Will test and treat, especially as this is long weekend. Rocephin here, never received for this previously.   Azithro 1000 mg once called in. Has allergy to PCNs, which is mild hives.  Ok for Rocephin.  Will call with results on Monday.

## 2014-01-07 DIAGNOSIS — M25561 Pain in right knee: Secondary | ICD-10-CM | POA: Insufficient documentation

## 2014-01-07 NOTE — Assessment & Plan Note (Signed)
Ortho referral today.  Seems to be LCL tear based on history.   Patient "in hurry," had to see a house and deferred any examination.  Tramadol for relief until he can be seen there.

## 2014-01-10 ENCOUNTER — Telehealth: Payer: Self-pay | Admitting: Family Medicine

## 2014-01-10 LAB — URINE CYTOLOGY ANCILLARY ONLY
Chlamydia: NEGATIVE
NEISSERIA GONORRHEA: NEGATIVE

## 2014-01-10 NOTE — Telephone Encounter (Signed)
Do you mind calling and letting Andrew [name] know that all of his labs were normal?  (we were checking for STDs).  Thank you,  Merry Proud

## 2014-01-11 ENCOUNTER — Telehealth: Payer: Self-pay | Admitting: Family Medicine

## 2014-01-11 NOTE — Telephone Encounter (Signed)
Please advise.Thank you.Andrew [name]  

## 2014-01-11 NOTE — Telephone Encounter (Signed)
Faxed over Tramadol the day of his appointment.  Unsure why this was not picked up.  He should ask his pharmacy if they did not receive it, or we can call and ask.  If they did not receive the fax, could we call this in with instructions as per last prescription.

## 2014-01-11 NOTE — Telephone Encounter (Signed)
Left several messages on voicemail for a return call,since no return calls,I left message on voicemail stating all labs were normal and to call back to confirm message received.Lerone Onder, Lewie Loron

## 2014-01-11 NOTE — Telephone Encounter (Signed)
Patient seen by Dr. Mingo Amber on 01/06/14 and states he was suppose to be prescribed meds for the pain in his legs. Also, patient request to speak to [facility]. Advised patient that she was not is the office today and that I would relay msg for a call back.

## 2014-01-12 ENCOUNTER — Ambulatory Visit: Payer: Medicare Other | Admitting: Family Medicine

## 2014-01-12 NOTE — Telephone Encounter (Signed)
Called [facility],pharmacist states it has been refilled yesterday on the 6th of April and it has been ready./informed patient he voiced understanding.[place]

## 2014-01-14 ENCOUNTER — Encounter: Payer: Self-pay | Admitting: Family Medicine

## 2014-01-14 ENCOUNTER — Ambulatory Visit (INDEPENDENT_AMBULATORY_CARE_PROVIDER_SITE_OTHER): Payer: Medicare Other | Admitting: Family Medicine

## 2014-01-14 VITALS — BP 122/88 | HR 71 | Temp 98.4°F | Wt 219.0 lb

## 2014-01-14 DIAGNOSIS — M25569 Pain in unspecified knee: Secondary | ICD-10-CM

## 2014-01-14 DIAGNOSIS — M25561 Pain in right knee: Secondary | ICD-10-CM

## 2014-01-14 DIAGNOSIS — F319 Bipolar disorder, unspecified: Secondary | ICD-10-CM | POA: Diagnosis not present

## 2014-01-14 MED ORDER — ACETAMINOPHEN-CODEINE #3 300-30 MG PO TABS: 1.0000 | ORAL_TABLET | Freq: Three times a day (TID) | ORAL | Status: DC | PRN

## 2014-01-14 NOTE — Patient Instructions (Signed)
I am sorry your knee is bothering you, but it is important to keep your appointment with Dr. Marlou Sa.  Take care, I will see you as needed!  Andrew [name], M.D.

## 2014-01-14 NOTE — Progress Notes (Signed)
Patient ID: Andrew [name], male   DOB: 31-Oct-1965, 48 y.o.   MRN: [id number]    Subjective: HPI: Patient is a 48 y.o. male presenting to clinic today for follow up for knee pain.  1. Right knee pain - Has been told he has a torn ligament (review of chart states medial meniscus.) Knee has been giving out. He wears a brace for comfort, but refuses to walk to with cane. Followed by Dr. Marlou Sa by [facility], referred back to Ortho at last visit. States he could not have surgery a few months ago because of dental abscess. Requesting other pain medication. Tramadol did not help in the past. Informed patient we do not do chronic narcotics, and he understands. Tylenol #3 has helped before and willing to try that again.   History Reviewed: Daily smoker. Health Maintenance: Need Tdap  ROS: Please see HPI above.  Objective: Office vital signs reviewed. BP 122/88  Pulse 71  Temp(Src) 98.4 F (36.9 C) (Oral)  Wt 219 lb (99.338 kg)  Physical Examination:  General: Awake, alert. NAD HEENT: Atraumatic, normocephalic. MMM. Pulm: CTAB, no wheezes Cardio: RRR, no murmurs appreciated Extremities: Trace edema. Brace on R knee. TTP medial joint line. Full extension, flexion to 90 degrees. No weakness appreciated with brace in place Neuro: Strength and sensation rossly intact  Assessment: 48 y.o. male follow up knee pain  Plan: See Problem List and After Visit Summary

## 2014-01-16 NOTE — Assessment & Plan Note (Signed)
A: Right knee pain due to medial meniscus injury. Awaiting surgery. Followed by Dr. Marlou Sa and next appt 4/16.  P: - Cancel Tramadol Rx at pharmacy - Rx Tylenol #3, disp 45 - F/u with ortho - Ice and NSAID prn.

## 2014-01-20 ENCOUNTER — Other Ambulatory Visit (HOSPITAL_COMMUNITY): Payer: Self-pay | Admitting: Orthopedic Surgery

## 2014-01-20 DIAGNOSIS — M25569 Pain in unspecified knee: Secondary | ICD-10-CM | POA: Diagnosis not present

## 2014-02-02 ENCOUNTER — Emergency Department (HOSPITAL_COMMUNITY)
Admission: EM | Admit: 2014-02-02 | Discharge: 2014-02-02 | Disposition: A | Discharge status: Home / Self Care | Payer: Medicare Other | Attending: Emergency Medicine | Admitting: Emergency Medicine

## 2014-02-02 ENCOUNTER — Encounter (HOSPITAL_COMMUNITY): Payer: Self-pay | Admitting: Emergency Medicine

## 2014-02-02 ENCOUNTER — Emergency Department (HOSPITAL_COMMUNITY): Payer: Medicare Other

## 2014-02-02 DIAGNOSIS — R059 Cough, unspecified: Secondary | ICD-10-CM | POA: Diagnosis not present

## 2014-02-02 DIAGNOSIS — M129 Arthropathy, unspecified: Secondary | ICD-10-CM | POA: Diagnosis not present

## 2014-02-02 DIAGNOSIS — F17200 Nicotine dependence, unspecified, uncomplicated: Secondary | ICD-10-CM | POA: Diagnosis not present

## 2014-02-02 DIAGNOSIS — Z880 Allergy status to penicillin: Secondary | ICD-10-CM | POA: Insufficient documentation

## 2014-02-02 DIAGNOSIS — R05 Cough: Secondary | ICD-10-CM

## 2014-02-02 DIAGNOSIS — I209 Angina pectoris, unspecified: Secondary | ICD-10-CM | POA: Diagnosis not present

## 2014-02-02 DIAGNOSIS — Z791 Long term (current) use of non-steroidal anti-inflammatories (NSAID): Secondary | ICD-10-CM | POA: Insufficient documentation

## 2014-02-02 DIAGNOSIS — Z8701 Personal history of pneumonia (recurrent): Secondary | ICD-10-CM | POA: Insufficient documentation

## 2014-02-02 DIAGNOSIS — Z872 Personal history of diseases of the skin and subcutaneous tissue: Secondary | ICD-10-CM | POA: Diagnosis not present

## 2014-02-02 DIAGNOSIS — M542 Cervicalgia: Secondary | ICD-10-CM | POA: Diagnosis not present

## 2014-02-02 DIAGNOSIS — Z8659 Personal history of other mental and behavioral disorders: Secondary | ICD-10-CM | POA: Insufficient documentation

## 2014-02-02 DIAGNOSIS — J9801 Acute bronchospasm: Secondary | ICD-10-CM | POA: Insufficient documentation

## 2014-02-02 DIAGNOSIS — Z79899 Other long term (current) drug therapy: Secondary | ICD-10-CM | POA: Insufficient documentation

## 2014-02-02 DIAGNOSIS — I10 Essential (primary) hypertension: Secondary | ICD-10-CM | POA: Insufficient documentation

## 2014-02-02 DIAGNOSIS — Z8719 Personal history of other diseases of the digestive system: Secondary | ICD-10-CM | POA: Diagnosis not present

## 2014-02-02 DIAGNOSIS — Z792 Long term (current) use of antibiotics: Secondary | ICD-10-CM | POA: Diagnosis not present

## 2014-02-02 DIAGNOSIS — R079 Chest pain, unspecified: Secondary | ICD-10-CM | POA: Diagnosis not present

## 2014-02-02 MED ORDER — OXYCODONE-ACETAMINOPHEN 5-325 MG PO TABS
1.0000 | ORAL_TABLET | Freq: Once | ORAL | Status: AC
  Administered 2014-02-02: 1 via ORAL
  Filled 2014-02-02: qty 1

## 2014-02-02 MED ORDER — PREDNISONE 20 MG PO TABS
60.0000 mg | ORAL_TABLET | Freq: Once | ORAL | Status: AC
  Administered 2014-02-02: 60 mg via ORAL
  Filled 2014-02-02: qty 3

## 2014-02-02 MED ORDER — IPRATROPIUM BROMIDE 0.02 % IN SOLN
0.5000 mg | Freq: Once | RESPIRATORY_TRACT | Status: AC
  Administered 2014-02-02: 0.5 mg via RESPIRATORY_TRACT
  Filled 2014-02-02: qty 2.5

## 2014-02-02 MED ORDER — ALBUTEROL SULFATE HFA 108 (90 BASE) MCG/ACT IN AERS
2.0000 | INHALATION_SPRAY | RESPIRATORY_TRACT | Status: DC | PRN
  Administered 2014-02-02: 2 via RESPIRATORY_TRACT
  Filled 2014-02-02: qty 6.7

## 2014-02-02 MED ORDER — NAPROXEN 500 MG PO TABS: 500.0000 mg | ORAL_TABLET | Freq: Two times a day (BID) | ORAL | Status: DC

## 2014-02-02 MED ORDER — PREDNISONE 50 MG PO TABS: 50.0000 mg | ORAL_TABLET | Freq: Every day | ORAL | Status: DC

## 2014-02-02 MED ORDER — ALBUTEROL SULFATE (2.5 MG/3ML) 0.083% IN NEBU
5.0000 mg | INHALATION_SOLUTION | Freq: Once | RESPIRATORY_TRACT | Status: AC
  Administered 2014-02-02: 5 mg via RESPIRATORY_TRACT
  Filled 2014-02-02: qty 6
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
## 2014-02-02 NOTE — ED Notes (Signed)
Pt presents to department for evaluation of diffuse chest pain and productive cough. Onset yesterday. 5/10 pain upon arrival to ED. Respirations unlabored. Pt is alert and oriented x4.

## 2014-02-02 NOTE — Discharge Instructions (Signed)
Return to the ED with any concerns including difficulty breathing despite using albuterol every 4 hours, not drinking fluids, decreased urine output, vomiting and not able to keep down liquids or medications, decreased level of alertness/lethargy, or any other alarming symptoms °

## 2014-02-04 ENCOUNTER — Telehealth: Payer: Self-pay

## 2014-02-04 NOTE — Telephone Encounter (Signed)
Returned call to patient.  No answer.  Will call patient back later.    Nolene Ebbs, RN

## 2014-02-04 NOTE — Telephone Encounter (Signed)
Pt called and would like Jeannette to call him. Blima Rich

## 2014-02-08 ENCOUNTER — Ambulatory Visit: Payer: Medicare Other | Admitting: Family Medicine

## 2014-02-16 ENCOUNTER — Encounter (HOSPITAL_COMMUNITY): Payer: Self-pay

## 2014-02-18 NOTE — Pre-Procedure Instructions (Signed)
Andrew [name]  02/18/2014   Your procedure is scheduled on: Tues, May 26 @ 11:00 AM  Report to [facility] at 9:00 AM.  Call this number if you have problems the morning of surgery: [phone]   Remember:   Do not eat food or drink liquids after midnight.   Take these medicines the morning of surgery with A SIP OF WATER: Prednisone(Deltasone)              Stop taking your Naproxen. No Goody's,BC's,Aspirin,Ibuprofen,Fish Oil,or any Herbal Medications   Do not wear jewelry  Do not wear lotions, powders, or colognes. You may wear deodorant.  Men may shave face and neck.  Do not bring valuables to the hospital.  [facility] is not responsible                  for any belongings or valuables.               Contacts, dentures or bridgework may not be worn into surgery.  Leave suitcase in the car. After surgery it may be brought to your room.  For patients admitted to the hospital, discharge time is determined by your                treatment team.               Patients discharged the day of surgery will not be allowed to drive  home.    Special Instructions:  [place] - Preparing for Surgery  Before surgery, you can play an important role.  Because skin is not sterile, your skin needs to be as free of germs as possible.  You can reduce the number of germs on you skin by washing with CHG (chlorahexidine gluconate) soap before surgery.  CHG is an antiseptic cleaner which kills germs and bonds with the skin to continue killing germs even after washing.  Please DO NOT use if you have an allergy to CHG or antibacterial soaps.  If your skin becomes reddened/irritated stop using the CHG and inform your nurse when you arrive at Short Stay.  Do not shave (including legs and underarms) for at least 48 hours prior to the first CHG shower.  You may shave your face.  Please follow these instructions carefully:   1.  Shower with CHG Soap the night before surgery and the                                 morning of Surgery.  2.  If you choose to wash your hair, wash your hair first as usual with your       normal shampoo.  3.  After you shampoo, rinse your hair and body thoroughly to remove the                      Shampoo.  4.  Use CHG as you would any other liquid soap.  You can apply chg directly       to the skin and wash gently with scrungie or a clean washcloth.  5.  Apply the CHG Soap to your body ONLY FROM THE NECK DOWN.        Do not use on open wounds or open sores.  Avoid contact with your eyes,       ears, mouth and genitals (private parts).  Wash genitals (private parts)  with your normal soap.  6.  Wash thoroughly, paying special attention to the area where your surgery        will be performed.  7.  Thoroughly rinse your body with warm water from the neck down.  8.  DO NOT shower/wash with your normal soap after using and rinsing off       the CHG Soap.  9.  Pat yourself dry with a clean towel.            10.  Wear clean pajamas.            11.  Place clean sheets on your bed the night of your first shower and do not        sleep with pets.  Day of Surgery  Do not apply any lotions/deoderants the morning of surgery.  Please wear clean clothes to the hospital/surgery center.     Please read over the following fact sheets that you were given: Pain Booklet, Coughing and Deep Breathing and Surgical Site Infection Prevention

## 2014-02-21 ENCOUNTER — Encounter (HOSPITAL_COMMUNITY)
Admission: RE | Admit: 2014-02-21 | Discharge: 2014-02-21 | Disposition: A | Discharge status: Home / Self Care | Payer: Medicare Other | Source: Ambulatory Visit | Attending: Orthopedic Surgery | Admitting: Orthopedic Surgery

## 2014-02-21 ENCOUNTER — Encounter (HOSPITAL_COMMUNITY): Payer: Self-pay

## 2014-02-21 DIAGNOSIS — Z01812 Encounter for preprocedural laboratory examination: Secondary | ICD-10-CM | POA: Diagnosis not present

## 2014-02-21 HISTORY — DX: Pain in unspecified joint: M25.50

## 2014-02-21 HISTORY — DX: Dizziness and giddiness: R42

## 2014-02-21 HISTORY — DX: Effusion, unspecified joint: M25.40

## 2014-02-21 HISTORY — DX: Hyperlipidemia, unspecified: E78.5

## 2014-02-21 HISTORY — DX: Personal history of other diseases of the respiratory system: Z87.09

## 2014-02-21 HISTORY — DX: Gastro-esophageal reflux disease without esophagitis: K21.9

## 2014-02-21 HISTORY — DX: Dorsalgia, unspecified: M54.9

## 2014-02-21 LAB — CBC
HCT: 51.6 % (ref 39.0–52.0)
HEMOGLOBIN: 17.9 g/dL — AB (ref 13.0–17.0)
MCH: 31.6 pg (ref 26.0–34.0)
MCHC: 34.7 g/dL (ref 30.0–36.0)
MCV: 91 fL (ref 78.0–100.0)
PLATELETS: 298 10*3/uL (ref 150–400)
RBC: 5.67 MIL/uL (ref 4.22–5.81)
RDW: 13.7 % (ref 11.5–15.5)
WBC: 6.1 10*3/uL (ref 4.0–10.5)

## 2014-02-21 LAB — BASIC METABOLIC PANEL
BUN: 11 mg/dL (ref 6–23)
CHLORIDE: 103 meq/L (ref 96–112)
CO2: 24 meq/L (ref 19–32)
Calcium: 8.7 mg/dL (ref 8.4–10.5)
Creatinine, Ser: 0.92 mg/dL (ref 0.50–1.35)
GFR calc Af Amer: >90 mL/min (ref >90)
GFR calc non Af Amer: >90 mL/min (ref >90)
GLUCOSE: 117 mg/dL — AB (ref 70–99)
POTASSIUM: 4 meq/L (ref 3.7–5.3)
SODIUM: 138 meq/L (ref 137–147)

## 2014-02-21 MED ORDER — CHLORHEXIDINE GLUCONATE 4 % EX LIQD: 60.0000 mL | Freq: Once | CUTANEOUS | Status: DC

## 2014-02-21 NOTE — Progress Notes (Addendum)
Pt doesn't have a cardiologist   Echo reports in epic from 2010 and 2013  Stress test done around 2010  Denies ever having a heart cath  EKG and CXR in epic from 02-02-14  Medical Md is Dr.Amber Hairford

## 2014-02-28 MED ORDER — CLINDAMYCIN PHOSPHATE 900 MG/50ML IV SOLN
900.0000 mg | INTRAVENOUS | Status: AC
  Administered 2014-03-01: 900 mg via INTRAVENOUS
  Filled 2014-02-28: qty 50

## 2014-03-01 ENCOUNTER — Encounter (HOSPITAL_COMMUNITY): Payer: Self-pay | Admitting: Anesthesiology

## 2014-03-01 ENCOUNTER — Ambulatory Visit (HOSPITAL_COMMUNITY)
Admission: RE | Admit: 2014-03-01 | Discharge: 2014-03-01 | Disposition: A | Discharge status: Home / Self Care | Payer: Medicare Other | Source: Ambulatory Visit | Attending: Orthopedic Surgery | Admitting: Orthopedic Surgery

## 2014-03-01 ENCOUNTER — Ambulatory Visit (HOSPITAL_COMMUNITY): Payer: Medicare Other | Admitting: Anesthesiology

## 2014-03-01 ENCOUNTER — Encounter (HOSPITAL_COMMUNITY): Payer: Medicare Other | Admitting: Anesthesiology

## 2014-03-01 ENCOUNTER — Encounter (HOSPITAL_COMMUNITY)
Admission: RE | Disposition: A | Discharge status: Home / Self Care | Payer: Self-pay | Source: Ambulatory Visit | Attending: Orthopedic Surgery

## 2014-03-01 DIAGNOSIS — I10 Essential (primary) hypertension: Secondary | ICD-10-CM | POA: Insufficient documentation

## 2014-03-01 DIAGNOSIS — F259 Schizoaffective disorder, unspecified: Secondary | ICD-10-CM | POA: Diagnosis not present

## 2014-03-01 DIAGNOSIS — F411 Generalized anxiety disorder: Secondary | ICD-10-CM | POA: Diagnosis not present

## 2014-03-01 DIAGNOSIS — Y929 Unspecified place or not applicable: Secondary | ICD-10-CM | POA: Insufficient documentation

## 2014-03-01 DIAGNOSIS — S83249A Other tear of medial meniscus, current injury, unspecified knee, initial encounter: Secondary | ICD-10-CM

## 2014-03-01 DIAGNOSIS — F17200 Nicotine dependence, unspecified, uncomplicated: Secondary | ICD-10-CM | POA: Insufficient documentation

## 2014-03-01 DIAGNOSIS — M23329 Other meniscus derangements, posterior horn of medial meniscus, unspecified knee: Secondary | ICD-10-CM | POA: Diagnosis not present

## 2014-03-01 DIAGNOSIS — E785 Hyperlipidemia, unspecified: Secondary | ICD-10-CM | POA: Diagnosis not present

## 2014-03-01 DIAGNOSIS — IMO0002 Reserved for concepts with insufficient information to code with codable children: Secondary | ICD-10-CM | POA: Diagnosis not present

## 2014-03-01 DIAGNOSIS — X58XXXA Exposure to other specified factors, initial encounter: Secondary | ICD-10-CM | POA: Insufficient documentation

## 2014-03-01 DIAGNOSIS — F319 Bipolar disorder, unspecified: Secondary | ICD-10-CM | POA: Insufficient documentation

## 2014-03-01 DIAGNOSIS — Z880 Allergy status to penicillin: Secondary | ICD-10-CM | POA: Insufficient documentation

## 2014-03-01 DIAGNOSIS — G8918 Other acute postprocedural pain: Secondary | ICD-10-CM | POA: Diagnosis not present

## 2014-03-01 DIAGNOSIS — K219 Gastro-esophageal reflux disease without esophagitis: Secondary | ICD-10-CM | POA: Insufficient documentation

## 2014-03-01 HISTORY — PX: KNEE ARTHROSCOPY WITH MEDIAL MENISECTOMY: SHX5651

## 2014-03-01 SURGERY — ARTHROSCOPY, KNEE, WITH MEDIAL MENISCECTOMY
Anesthesia: General | Site: Knee | Laterality: Right

## 2014-03-01 MED ORDER — SODIUM CHLORIDE 0.9 % IR SOLN
Status: DC | PRN
  Administered 2014-03-01 (×2): 3000 mL
  Administered 2014-03-01: 1000 mL

## 2014-03-01 MED ORDER — MORPHINE SULFATE 4 MG/ML IJ SOLN
INTRAMUSCULAR | Status: DC | PRN
  Administered 2014-03-01: 8 mg via SUBCUTANEOUS

## 2014-03-01 MED ORDER — BUPIVACAINE-EPINEPHRINE (PF) 0.25% -1:200000 IJ SOLN
INTRAMUSCULAR | Status: DC | PRN
  Administered 2014-03-01: 10 mL

## 2014-03-01 MED ORDER — ONDANSETRON HCL 4 MG/2ML IJ SOLN
INTRAMUSCULAR | Status: DC | PRN
  Administered 2014-03-01: 4 mg via INTRAVENOUS

## 2014-03-01 MED ORDER — FENTANYL CITRATE 0.05 MG/ML IJ SOLN
INTRAMUSCULAR | Status: AC
  Filled 2014-03-01: qty 5

## 2014-03-01 MED ORDER — MIDAZOLAM HCL 5 MG/5ML IJ SOLN
INTRAMUSCULAR | Status: DC | PRN
  Administered 2014-03-01 (×2): 1 mg via INTRAVENOUS

## 2014-03-01 MED ORDER — LACTATED RINGERS IV SOLN
INTRAVENOUS | Status: DC | PRN
  Administered 2014-03-01 (×2): via INTRAVENOUS

## 2014-03-01 MED ORDER — LIDOCAINE HCL (CARDIAC) 20 MG/ML IV SOLN
INTRAVENOUS | Status: AC
  Filled 2014-03-01: qty 5

## 2014-03-01 MED ORDER — LIDOCAINE HCL (CARDIAC) 20 MG/ML IV SOLN
INTRAVENOUS | Status: DC | PRN
  Administered 2014-03-01: 80 mg via INTRAVENOUS

## 2014-03-01 MED ORDER — MORPHINE SULFATE 4 MG/ML IJ SOLN
INTRAMUSCULAR | Status: AC
  Filled 2014-03-01: qty 2

## 2014-03-01 MED ORDER — CLONIDINE HCL (ANALGESIA) 100 MCG/ML EP SOLN
EPIDURAL | Status: DC | PRN
  Administered 2014-03-01: 1 mL

## 2014-03-01 MED ORDER — ROCURONIUM BROMIDE 50 MG/5ML IV SOLN
INTRAVENOUS | Status: AC
  Filled 2014-03-01: qty 1

## 2014-03-01 MED ORDER — BUPIVACAINE-EPINEPHRINE (PF) 0.25% -1:200000 IJ SOLN
INTRAMUSCULAR | Status: AC
  Filled 2014-03-01: qty 30

## 2014-03-01 MED ORDER — PROPOFOL 10 MG/ML IV BOLUS
INTRAVENOUS | Status: DC | PRN
  Administered 2014-03-01: 200 mg via INTRAVENOUS

## 2014-03-01 MED ORDER — ROPIVACAINE HCL 5 MG/ML IJ SOLN
INTRAMUSCULAR | Status: DC | PRN
  Administered 2014-03-01: 15 mL via PERINEURAL

## 2014-03-01 MED ORDER — BUPIVACAINE HCL (PF) 0.25 % IJ SOLN
INTRAMUSCULAR | Status: AC
  Filled 2014-03-01: qty 30

## 2014-03-01 MED ORDER — PROPOFOL 10 MG/ML IV BOLUS
INTRAVENOUS | Status: AC
  Filled 2014-03-01: qty 20

## 2014-03-01 MED ORDER — OXYCODONE-ACETAMINOPHEN 10-325 MG PO TABS: 1.0000 | ORAL_TABLET | Freq: Four times a day (QID) | ORAL | Status: DC | PRN

## 2014-03-01 MED ORDER — MIDAZOLAM HCL 2 MG/2ML IJ SOLN
INTRAMUSCULAR | Status: AC
  Filled 2014-03-01: qty 2

## 2014-03-01 MED ORDER — FENTANYL CITRATE 0.05 MG/ML IJ SOLN
INTRAMUSCULAR | Status: DC | PRN
  Administered 2014-03-01 (×2): 25 ug via INTRAVENOUS

## 2014-03-01 MED ORDER — LACTATED RINGERS IV SOLN
INTRAVENOUS | Status: DC
  Administered 2014-03-01: 10:00:00 via INTRAVENOUS

## 2014-03-01 SURGICAL SUPPLY — 57 items
BANDAGE ELASTIC 6 VELCRO ST LF (GAUZE/BANDAGES/DRESSINGS) ×2 IMPLANT
BANDAGE ESMARK 6X9 LF (GAUZE/BANDAGES/DRESSINGS) IMPLANT
BLADE CUDA 5.5 (BLADE) IMPLANT
BLADE GREAT WHITE 4.2 (BLADE) IMPLANT
BLADE GREAT WHITE 4.2MM (BLADE)
BLADE SURG ROTATE 9660 (MISCELLANEOUS) IMPLANT
BNDG CMPR 9X6 STRL LF SNTH (GAUZE/BANDAGES/DRESSINGS)
BNDG ESMARK 6X9 LF (GAUZE/BANDAGES/DRESSINGS)
BUR OVAL 6.0 (BURR) IMPLANT
COVER SURGICAL LIGHT HANDLE (MISCELLANEOUS) ×3 IMPLANT
CUFF TOURNIQUET SINGLE 34IN LL (TOURNIQUET CUFF) IMPLANT
CUFF TOURNIQUET SINGLE 44IN (TOURNIQUET CUFF) IMPLANT
DRAPE ARTHROSCOPY W/POUCH 114 (DRAPES) ×3 IMPLANT
DRAPE INCISE IOBAN 66X45 STRL (DRAPES) IMPLANT
DRAPE PROXIMA HALF (DRAPES) IMPLANT
DRAPE U-SHAPE 47X51 STRL (DRAPES) ×3 IMPLANT
DRSG PAD ABDOMINAL 8X10 ST (GAUZE/BANDAGES/DRESSINGS) ×2 IMPLANT
DURAPREP 26ML APPLICATOR (WOUND CARE) ×3 IMPLANT
GAUZE XEROFORM 1X8 LF (GAUZE/BANDAGES/DRESSINGS) ×2 IMPLANT
GLOVE BIOGEL PI IND STRL 8 (GLOVE) ×1 IMPLANT
GLOVE BIOGEL PI INDICATOR 8 (GLOVE) ×2
GLOVE SURG ORTHO 8.0 STRL STRW (GLOVE) ×3 IMPLANT
GOWN STRL REUS W/ TWL LRG LVL3 (GOWN DISPOSABLE) ×2 IMPLANT
GOWN STRL REUS W/ TWL XL LVL3 (GOWN DISPOSABLE) ×1 IMPLANT
GOWN STRL REUS W/TWL LRG LVL3 (GOWN DISPOSABLE) ×6
GOWN STRL REUS W/TWL XL LVL3 (GOWN DISPOSABLE) ×3
KIT BASIN OR (CUSTOM PROCEDURE TRAY) ×3 IMPLANT
KIT ROOM TURNOVER OR (KITS) ×3 IMPLANT
MANIFOLD NEPTUNE II (INSTRUMENTS) IMPLANT
NDL 18GX1X1/2 (RX/OR ONLY) (NEEDLE) IMPLANT
NDL HYPO 25GX1X1/2 BEV (NEEDLE) ×1 IMPLANT
NEEDLE 18GX1X1/2 (RX/OR ONLY) (NEEDLE) IMPLANT
NEEDLE HYPO 25GX1X1/2 BEV (NEEDLE) ×3 IMPLANT
NS IRRIG 1000ML POUR BTL (IV SOLUTION) IMPLANT
PACK ARTHROSCOPY DSU (CUSTOM PROCEDURE TRAY) ×3 IMPLANT
PAD ARMBOARD 7.5X6 YLW CONV (MISCELLANEOUS) ×6 IMPLANT
PADDING CAST ABS 4INX4YD NS (CAST SUPPLIES) ×2
PADDING CAST ABS 6INX4YD NS (CAST SUPPLIES) ×2
PADDING CAST ABS COTTON 4X4 ST (CAST SUPPLIES) IMPLANT
PADDING CAST ABS COTTON 6X4 NS (CAST SUPPLIES) IMPLANT
PADDING CAST COTTON 6X4 STRL (CAST SUPPLIES) ×3 IMPLANT
SET ARTHROSCOPY TUBING (MISCELLANEOUS) ×3
SET ARTHROSCOPY TUBING LN (MISCELLANEOUS) ×1 IMPLANT
SPONGE GAUZE 4X4 12PLY (GAUZE/BANDAGES/DRESSINGS) ×2 IMPLANT
SPONGE LAP 4X18 X RAY DECT (DISPOSABLE) ×3 IMPLANT
SUT ETHILON 3 0 PS 1 (SUTURE) IMPLANT
SUT MENISCAL KIT (KITS) IMPLANT
SYR 20ML ECCENTRIC (SYRINGE) ×3 IMPLANT
SYR CONTROL 10ML LL (SYRINGE) IMPLANT
SYR TB 1ML LUER SLIP (SYRINGE) ×3 IMPLANT
TOWEL OR 17X24 6PK STRL BLUE (TOWEL DISPOSABLE) ×3 IMPLANT
TOWEL OR 17X26 10 PK STRL BLUE (TOWEL DISPOSABLE) ×3 IMPLANT
TUBE CONNECTING 12'X1/4 (SUCTIONS) ×1
TUBE CONNECTING 12X1/4 (SUCTIONS) ×2 IMPLANT
WAND 90 DEG TURBOVAC W/CORD (SURGICAL WAND) ×2 IMPLANT
WAND HAND CNTRL MULTIVAC 90 (MISCELLANEOUS) ×3 IMPLANT
WATER STERILE IRR 1000ML POUR (IV SOLUTION) ×3 IMPLANT

## 2014-03-01 NOTE — Anesthesia Procedure Notes (Addendum)
Anesthesia Regional Block:  Adductor canal block  Pre-Anesthetic Checklist: ,, timeout performed, Correct Patient, Correct Site, Correct Laterality, Correct Procedure, Correct Position, site marked, Risks and benefits discussed,  Surgical consent,  Pre-op evaluation,  At surgeon's request and post-op pain management  Laterality: Lower and Right  Prep: chloraprep       Needles:  Injection technique: Single-shot  Needle Type: Echogenic Needle          Additional Needles:  Procedures: ultrasound guided (picture in chart) Adductor canal block Narrative:  Start time: 03/01/2014 11:06 AM End time: 03/01/2014 11:11 AM Injection made incrementally with aspirations every 5 mL.  Performed by: Personally  Anesthesiologist: Moser  Additional Notes: H+P and labs reviewed, risks and benefits discussed with patient, procedure tolerated well without complications   Procedure Name: LMA Insertion Date/Time: 03/01/2014 11:02 AM Performed by: Susa Loffler Pre-anesthesia Checklist: Patient identified, Timeout performed, Emergency Drugs available, Suction available and Patient being monitored Patient Re-evaluated:Patient Re-evaluated prior to inductionOxygen Delivery Method: Circle system utilized Preoxygenation: Pre-oxygenation with 100% oxygen Intubation Type: IV induction LMA: LMA inserted LMA Size: 5.0 Number of attempts: 1 Placement Confirmation: positive ETCO2 and breath sounds checked- equal and bilateral Tube secured with: Tape Dental Injury: Teeth and Oropharynx as per pre-operative assessment

## 2014-03-01 NOTE — Anesthesia Preprocedure Evaluation (Addendum)
Anesthesia Evaluation  Patient identified by MRN, date of birth, ID band Patient awake    Reviewed: Allergy & Precautions, H&P , NPO status   History of Anesthesia Complications Negative for: history of anesthetic complications  Airway Mallampati: II TM Distance: >3 FB Neck ROM: Full    Dental  (+) Edentulous Upper,    Pulmonary Recent URI , Current Smoker,  1 ppd breath sounds clear to auscultation  Pulmonary exam normal       Cardiovascular hypertension, Pt. on medications + angina Rhythm:Regular  Patient states had chest pain x 1, medicated, and has not recurred   Neuro/Psych  Headaches, PSYCHIATRIC DISORDERS Anxiety Depression Bipolar Disorder Schizophrenia Headaches    GI/Hepatic Neg liver ROS, GERD-  Medicated and Controlled,Related to type and amount of food consumption.   Endo/Other  negative endocrine ROS  Renal/GU negative Renal ROS     Musculoskeletal   Abdominal Normal abdominal exam  (+)   Peds  Hematology negative hematology ROS (+)   Anesthesia Other Findings   Reproductive/Obstetrics                          Anesthesia Physical  Anesthesia Plan  ASA: II  Anesthesia Plan: General   Post-op Pain Management:    Induction: Intravenous  Airway Management Planned: LMA  Additional Equipment: None  Intra-op Plan:   Post-operative Plan: Extubation in OR  Informed Consent: I have reviewed the patients History and Physical, chart, labs and discussed the procedure including the risks, benefits and alternatives for the proposed anesthesia with the patient or authorized representative who has indicated his/her understanding and acceptance.   Dental advisory given  Plan Discussed with: CRNA and Surgeon  Anesthesia Plan Comments:        Anesthesia Quick Evaluation

## 2014-03-01 NOTE — Op Note (Signed)
03/01/2014  12:12 PM  PATIENT:  Andrew [name]  48 y.o. male  PRE-OPERATIVE DIAGNOSIS:  RIGHT KNEE MEDIAL MENISCUS TEAR  POST-OPERATIVE DIAGNOSIS:  Right Knee Medial meniscus Tear  PROCEDURE:  Procedure(s): KNEE ARTHROSCOPY WITH MEDIAL MENISECTOMY  SURGEON:  Surgeon(s): Meredith Pel, MD  ASSISTANT: none  ANESTHESIA:   general  EBL: 10 ml    Total I/O In: 1000 [I.V.:1000] Out: -   BLOOD ADMINISTERED: none  DRAINS: none   LOCAL MEDICATIONS USED:  Marcaine mso4 clonidine  SPECIMEN:  No Specimen  COUNTS:  YES  TOURNIQUET:    DICTATION: .Other Dictation: Dictation Number [id number]  PLAN OF CARE: Discharge to home after PACU  PATIENT DISPOSITION:  PACU - hemodynamically stable

## 2014-03-01 NOTE — H&P (Signed)
Andrew [name] is an 48 y.o. male.   Chief Complaint: Right knee pain HPI: Andrew [name] is a 48 year old patient with right knee pain. He has had history of medial meniscal tear. He's failed conservative management. 6 reschedule for prior knee arthroscopy and debridement but he had a tooth abscess. Since been taken care of. He reports continued pain. Denies a family history of DVT or pulmonary embolism. Reports occasional mechanical symptoms as well.  Past Medical History  Diagnosis Date  . Bipolar disorder   . Umbilical hernia   . Anxiety   . Depression   . Schizoaffective disorder   . Hypertension     takes HCTZ daily  . Hyperlipidemia     takes Zetia daily  . History of bronchitis     1 month ago and treated with Amoxicillin and given an inhaler;takes Prenisone  . Pneumonia     hx of 2 yrs ago  . Generalized headaches     history of migraines-last one 02/02/14  . Dizziness   . Arthritis     right knee  . Joint pain   . Joint swelling   . Back pain     occasionally  . GERD (gastroesophageal reflux disease)     takes Prilosec daily as needed    Past Surgical History  Procedure Laterality Date  . Lipoma excision  ~ 2010    "back left side of my head"  . Esophagogastroduodenoscopy      Family History  Problem Relation Age of Onset  . Hypertension Mother   . Cancer Mother   . Heart disease Father   . Heart attack Father 8  . Hyperlipidemia Father   . Hypertension Father   . Diabetes Sister   . Diabetes Brother    Social History:  reports that he has been smoking Cigarettes.  He has a 8 pack-year smoking history. He has never used smokeless tobacco. He reports that he drinks alcohol. He reports that he does not use illicit drugs.  Allergies:  Allergies  Allergen Reactions  . Penicillins Hives    No prescriptions prior to admission    No results found for this or any previous visit (from the past 68 hour(s)). No results found.  Review of Systems  Constitutional:  Negative.   HENT: Negative.   Eyes: Negative.   Respiratory: Negative.   Cardiovascular: Negative.   Gastrointestinal: Negative.   Genitourinary: Negative.   Musculoskeletal: Positive for joint pain.  Skin: Negative.   Neurological: Negative.   Endo/Heme/Allergies: Negative.   Psychiatric/Behavioral: Negative.     There were no vitals taken for this visit. Physical Exam  Constitutional: He appears well-developed.  HENT:  Head: Normocephalic.  Eyes: Pupils are equal, round, and reactive to light.  Neck: Normal range of motion.  Cardiovascular: Normal rate.   Respiratory: Effort normal.  Neurological: He is alert.  Skin: Skin is warm.  Psychiatric: He has a normal mood and affect.   right knee demonstrates full range of motion stable collateral crucial ligaments medial joint line tenderness in [place] mechanism trace effusion positive work compression testing no skin changes around the knee  Assessment/Plan Impression is right knee medial meniscal tear symptomatic despite conservative measures plan knee arthroscopy and debridement risk and benefits discussed with the patient including not limited to infection nerve vessel damage incomplete healing incomplete pain relief knee stiffness all questions answered anticipate discharge over the hospital as an outpatient.  Andrew [name] 03/01/2014, 7:26 AM

## 2014-03-01 NOTE — Progress Notes (Signed)
Orthopedic Tech Progress Note Patient Details:  Andrew [name] 1966-09-26 [id number]  Ortho Devices Type of Ortho Device: Crutches Ortho Device/Splint Interventions: Application   Theodoro Parma Cammer 03/01/2014, 1:42 PM

## 2014-03-01 NOTE — Transfer of Care (Signed)
Immediate Anesthesia Transfer of Care Note  Patient: Andrew [name]  Procedure(s) Performed: Procedure(s): KNEE ARTHROSCOPY WITH MEDIAL MENISECTOMY (Right)  Patient Location: PACU  Anesthesia Type:General  Level of Consciousness: awake, alert  and oriented  Airway & Oxygen Therapy: Patient Spontanous Breathing and Patient connected to nasal cannula oxygen  Post-op Assessment: Report given to PACU RN and Post -op Vital signs reviewed and stable  Post vital signs: Reviewed and stable  Complications: No apparent anesthesia complications

## 2014-03-02 NOTE — Op Note (Deleted)
Andrew [name], Andrew [name]                 ACCOUNT NO.:  [id number]  MEDICAL RECORD NO.:  [id number]  LOCATION:                               FACILITY:  [facility]  PHYSICIAN:  Anderson Malta, M.D.    DATE OF BIRTH:  [birth year]  DATE OF PROCEDURE:  03/01/2014 DATE OF DISCHARGE:  03/01/2014                              OPERATIVE REPORT   PREOPERATIVE DIAGNOSIS:  Right knee medial meniscal tear.  POSTOPERATIVE DIAGNOSIS:  Right knee medial meniscal tear.  PROCEDURE:  Right knee diagnostic __________ arthroscopy with partial medial meniscectomy.  SURGEON:  Anderson Malta, M.D.  ASSISTANT:  None.  ANESTHESIA:  General.  INDICATIONS:  This is a patient with right knee pain, presents for operative management after explanation of risks and benefits, and failure of conservative management for his medial meniscal tear.  OPERATIVE FINDINGS: 1. Examination under anesthesia, range of motion 0 to 135 degrees of     flexion __________ varus and valgus stress at 0 to 30 degrees.  No     posterolateral rotatory instability is noted. 2. Diagnostic arthroscopy: 3. 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 4. Intact lateral compartment articular cartilage and meniscus. 5. Intact ACL and PCL. 6. Some spurring on the inferior pole of patella. 7. Grade 2-3 chondromalacia over essentially the entire weightbearing     surface area of the medial femoral condyle with no focal pothole     with a tear of the posterior horn medial meniscus, involving about     60% anterior-posterior width of the meniscus.  PROCEDURE IN DETAIL:  The patient was brought to the operating room, where general endotracheal anesthesia was induced.  Preoperative antibiotics were administered.  Time-out was called.  Right leg was prescrubbed with alcohol and Betadine, which was allowed to air dry, prepped with DuraPrep solution and draped in  sterile manner including the foot.  Anterior and inferolateral and anterior and inferomedial ports were established.  Diagnostic arthroscopy was performed. Patellofemoral compartment was generally intact, although, there was some early grade 1 and early grade 2 wear on the lateral facet of the patella.  No loose bodies in medial lateral gutter.  Lateral compartment was intact.  ACL and PCL intact.  Medial compartment demonstrated significant tear of the medial meniscus along with grade 2-3 chondromalacia over essentially the entire weightbearing surface area of the medial femoral condyle with a focal chondral defect.  The meniscal tear was debrided back to stable rims with combination of basket punch and shaver.  ArthroCare wand used to control bleeding points.  Thorough irrigation performed.  Instruments were removed. Portals closed using 3-0 nylon.  Solution of Marcaine, morphine, clonidine injected into the knee.  Bulky dressing applied.  The patient tolerated the procedure well without immediate complications.     Anderson Malta, M.D.     GSD/MEDQ  D:  03/01/2014  T:  03/02/2014  Job:  [id number]

## 2014-03-02 NOTE — Op Note (Signed)
NAME:  [name], Andrew                 ACCOUNT NO.:  [id number]  MEDICAL RECORD NO.:  [id number]  LOCATION:                               FACILITY:  [facility]  PHYSICIAN:  G. Scott Ayelen Sciortino, M.D.    DATE OF BIRTH:  04/07/1966  DATE OF PROCEDURE:  03/01/2014 DATE OF DISCHARGE:  03/01/2014                              OPERATIVE REPORT   PREOPERATIVE DIAGNOSIS:  Right knee medial meniscal tear.  POSTOPERATIVE DIAGNOSIS:  Right knee medial meniscal tear.  PROCEDURE:  Right knee diagnostic __________ arthroscopy with partial medial meniscectomy.  SURGEON:  G. Scott Kaelyn Nauta, M.D.  ASSISTANT:  None.  ANESTHESIA:  General.  INDICATIONS:  This is a patient with right knee pain, presents for operative management after explanation of risks and benefits, and failure of conservative management for his medial meniscal tear.  OPERATIVE FINDINGS: 1. Examination under anesthesia, range of motion 0 to 135 degrees of     flexion __________ varus and valgus stress at 0 to 30 degrees.  No     posterolateral rotatory instability is noted. 2. Diagnostic arthroscopy: 3. 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 4. Intact lateral compartment articular cartilage and meniscus. 5. Intact ACL and PCL. 6. Some spurring on the inferior pole of patella. 7. Grade 2-3 chondromalacia over essentially the entire weightbearing     surface area of the medial femoral condyle with no focal pothole     with a tear of the posterior horn medial meniscus, involving about     60% anterior-posterior width of the meniscus.  PROCEDURE IN DETAIL:  The patient was brought to the operating room, where general endotracheal anesthesia was induced.  Preoperative antibiotics were administered.  Time-out was called.  Right leg was prescrubbed with alcohol and Betadine, which was allowed to air dry, prepped with DuraPrep solution and draped in  sterile manner including the foot.  Anterior and inferolateral and anterior and inferomedial ports were established.  Diagnostic arthroscopy was performed. Patellofemoral compartment was generally intact, although, there was some early grade 1 and early grade 2 wear on the lateral facet of the patella.  No loose bodies in medial lateral gutter.  Lateral compartment was intact.  ACL and PCL intact.  Medial compartment demonstrated significant tear of the medial meniscus along with grade 2-3 chondromalacia over essentially the entire weightbearing surface area of the medial femoral condyle with a focal chondral defect.  The meniscal tear was debrided back to stable rims with combination of basket punch and shaver.  ArthroCare wand used to control bleeding points.  Thorough irrigation performed.  Instruments were removed. Portals closed using 3-0 nylon.  Solution of Marcaine, morphine, clonidine injected into the knee.  Bulky dressing applied.  The patient tolerated the procedure well without immediate complications.     G. Scott Mallie Giambra, M.D.     GSD/MEDQ  D:  03/01/2014  T:  03/02/2014  Job:  [id number] 

## 2014-03-02 NOTE — Anesthesia Postprocedure Evaluation (Signed)
  Anesthesia Post-op Note  Patient: Andrew [name]  Procedure(s) Performed: Procedure(s): KNEE ARTHROSCOPY WITH MEDIAL MENISECTOMY (Right)  Patient Location: PACU  Anesthesia Type:General and Regional  Level of Consciousness: awake and alert   Airway and Oxygen Therapy: Patient Spontanous Breathing  Post-op Pain: mild  Post-op Assessment: Post-op Vital signs reviewed, Patient's Cardiovascular Status Stable, Respiratory Function Stable, Patent Airway, No signs of Nausea or vomiting and Pain level controlled  Post-op Vital Signs: Reviewed and stable  Last Vitals:  Filed Vitals:   03/01/14 1315  BP: 124/88  Pulse: 64  Temp: 36.4 C  Resp: 16    Complications: No apparent anesthesia complications

## 2014-03-03 ENCOUNTER — Encounter (HOSPITAL_COMMUNITY): Payer: Self-pay | Admitting: Orthopedic Surgery

## 2014-03-31 DIAGNOSIS — F319 Bipolar disorder, unspecified: Secondary | ICD-10-CM | POA: Diagnosis not present

## 2014-04-13 DIAGNOSIS — M23329 Other meniscus derangements, posterior horn of medial meniscus, unspecified knee: Secondary | ICD-10-CM | POA: Diagnosis not present

## 2014-04-13 DIAGNOSIS — M25569 Pain in unspecified knee: Secondary | ICD-10-CM | POA: Diagnosis not present

## 2014-04-25 DIAGNOSIS — F319 Bipolar disorder, unspecified: Secondary | ICD-10-CM | POA: Diagnosis not present

## 2014-05-11 DIAGNOSIS — F319 Bipolar disorder, unspecified: Secondary | ICD-10-CM | POA: Diagnosis not present

## 2014-05-19 DIAGNOSIS — Z125 Encounter for screening for malignant neoplasm of prostate: Secondary | ICD-10-CM | POA: Diagnosis not present

## 2014-05-19 DIAGNOSIS — M23302 Other meniscus derangements, unspecified lateral meniscus, unspecified knee: Secondary | ICD-10-CM | POA: Diagnosis not present

## 2014-05-19 DIAGNOSIS — Z87891 Personal history of nicotine dependence: Secondary | ICD-10-CM | POA: Diagnosis not present

## 2014-05-19 DIAGNOSIS — E782 Mixed hyperlipidemia: Secondary | ICD-10-CM | POA: Diagnosis not present

## 2014-05-19 DIAGNOSIS — E781 Pure hyperglyceridemia: Secondary | ICD-10-CM | POA: Diagnosis not present

## 2014-05-19 DIAGNOSIS — I10 Essential (primary) hypertension: Secondary | ICD-10-CM | POA: Diagnosis not present

## 2014-06-23 DIAGNOSIS — E782 Mixed hyperlipidemia: Secondary | ICD-10-CM | POA: Diagnosis not present

## 2014-06-23 DIAGNOSIS — R7309 Other abnormal glucose: Secondary | ICD-10-CM | POA: Diagnosis not present

## 2014-08-15 DIAGNOSIS — S9781XA Crushing injury of right foot, initial encounter: Secondary | ICD-10-CM | POA: Diagnosis not present

## 2014-08-15 DIAGNOSIS — S8701XA Crushing injury of right knee, initial encounter: Secondary | ICD-10-CM | POA: Diagnosis not present

## 2014-08-15 DIAGNOSIS — M25461 Effusion, right knee: Secondary | ICD-10-CM | POA: Diagnosis not present

## 2014-08-15 DIAGNOSIS — S8991XA Unspecified injury of right lower leg, initial encounter: Secondary | ICD-10-CM | POA: Diagnosis not present

## 2014-08-22 DIAGNOSIS — M25461 Effusion, right knee: Secondary | ICD-10-CM | POA: Diagnosis not present

## 2014-08-22 DIAGNOSIS — S3992XA Unspecified injury of lower back, initial encounter: Secondary | ICD-10-CM | POA: Diagnosis not present

## 2014-08-22 DIAGNOSIS — M549 Dorsalgia, unspecified: Secondary | ICD-10-CM | POA: Diagnosis not present

## 2014-08-22 DIAGNOSIS — S8991XA Unspecified injury of right lower leg, initial encounter: Secondary | ICD-10-CM | POA: Diagnosis not present

## 2014-09-04 ENCOUNTER — Emergency Department (HOSPITAL_COMMUNITY): Payer: Medicare Other

## 2014-09-04 ENCOUNTER — Encounter (HOSPITAL_COMMUNITY): Payer: Self-pay | Admitting: *Deleted

## 2014-09-04 ENCOUNTER — Emergency Department (HOSPITAL_COMMUNITY)
Admission: EM | Admit: 2014-09-04 | Discharge: 2014-09-04 | Disposition: A | Discharge status: Home / Self Care | Payer: Medicare Other | Attending: Emergency Medicine | Admitting: Emergency Medicine

## 2014-09-04 DIAGNOSIS — R059 Cough, unspecified: Secondary | ICD-10-CM

## 2014-09-04 DIAGNOSIS — E785 Hyperlipidemia, unspecified: Secondary | ICD-10-CM | POA: Diagnosis not present

## 2014-09-04 DIAGNOSIS — M1711 Unilateral primary osteoarthritis, right knee: Secondary | ICD-10-CM | POA: Insufficient documentation

## 2014-09-04 DIAGNOSIS — G43909 Migraine, unspecified, not intractable, without status migrainosus: Secondary | ICD-10-CM | POA: Diagnosis not present

## 2014-09-04 DIAGNOSIS — Z8701 Personal history of pneumonia (recurrent): Secondary | ICD-10-CM | POA: Diagnosis not present

## 2014-09-04 DIAGNOSIS — Z7952 Long term (current) use of systemic steroids: Secondary | ICD-10-CM | POA: Diagnosis not present

## 2014-09-04 DIAGNOSIS — I10 Essential (primary) hypertension: Secondary | ICD-10-CM | POA: Diagnosis not present

## 2014-09-04 DIAGNOSIS — Z880 Allergy status to penicillin: Secondary | ICD-10-CM | POA: Diagnosis not present

## 2014-09-04 DIAGNOSIS — Z720 Tobacco use: Secondary | ICD-10-CM | POA: Diagnosis not present

## 2014-09-04 DIAGNOSIS — Z79899 Other long term (current) drug therapy: Secondary | ICD-10-CM | POA: Insufficient documentation

## 2014-09-04 DIAGNOSIS — K219 Gastro-esophageal reflux disease without esophagitis: Secondary | ICD-10-CM | POA: Insufficient documentation

## 2014-09-04 DIAGNOSIS — F319 Bipolar disorder, unspecified: Secondary | ICD-10-CM | POA: Insufficient documentation

## 2014-09-04 DIAGNOSIS — R05 Cough: Secondary | ICD-10-CM | POA: Diagnosis not present

## 2014-09-04 DIAGNOSIS — F419 Anxiety disorder, unspecified: Secondary | ICD-10-CM | POA: Diagnosis not present

## 2014-09-04 DIAGNOSIS — J40 Bronchitis, not specified as acute or chronic: Secondary | ICD-10-CM

## 2014-09-04 DIAGNOSIS — R0602 Shortness of breath: Secondary | ICD-10-CM | POA: Diagnosis not present

## 2014-09-04 DIAGNOSIS — R112 Nausea with vomiting, unspecified: Secondary | ICD-10-CM | POA: Diagnosis present

## 2014-09-04 DIAGNOSIS — F17210 Nicotine dependence, cigarettes, uncomplicated: Secondary | ICD-10-CM | POA: Diagnosis not present

## 2014-09-04 MED ORDER — ALBUTEROL SULFATE HFA 108 (90 BASE) MCG/ACT IN AERS: 2.0000 | INHALATION_SPRAY | RESPIRATORY_TRACT | Status: DC | PRN

## 2014-09-04 MED ORDER — ALBUTEROL SULFATE HFA 108 (90 BASE) MCG/ACT IN AERS
2.0000 | INHALATION_SPRAY | RESPIRATORY_TRACT | Status: DC
  Administered 2014-09-04: 2 via RESPIRATORY_TRACT
  Filled 2014-09-04: qty 6.7

## 2014-09-04 NOTE — ED Notes (Signed)
Pt reports feeling bad for several days. Having n/v, blood in stools, head congestion and fever/chills.

## 2014-09-04 NOTE — ED Provider Notes (Signed)
CSN: [id number]     Arrival date & time 09/04/14  1118 History   First MD Initiated Contact with Patient 09/04/14 1133     Chief Complaint  Patient presents with  . Emesis  . Blood In Stools      The history is provided by the patient.   patient reports productive cough over the past several days.  He reports nasal congestion.  Low-grade chills and fever.  He reports nausea and vomiting.  He denies diarrhea.  He reports majority of his vomiting is more when he coughs excessively.  There were streaks of blood in his vomit.  No gross blood vomited.  Denies abdominal pain.  No chest pain.  Symptoms are mild in severity.  No recent sick contacts.  Reports possible streaks of blood in his stool as well.  Denies active pain.  No history of hemorrhoids  Past Medical History  Diagnosis Date  . Bipolar disorder   . Umbilical hernia   . Anxiety   . Depression   . Schizoaffective disorder   . Hypertension     takes HCTZ daily  . Hyperlipidemia     takes Zetia daily  . History of bronchitis     1 month ago and treated with Amoxicillin and given an inhaler;takes Prenisone  . Pneumonia     hx of 2 yrs ago  . Generalized headaches     history of migraines-last one 02/02/14  . Dizziness   . Arthritis     right knee  . Joint pain   . Joint swelling   . Back pain     occasionally  . GERD (gastroesophageal reflux disease)     takes Prilosec daily as needed   Past Surgical History  Procedure Laterality Date  . Lipoma excision  ~ 2010    "back left side of my head"  . Esophagogastroduodenoscopy    . Knee arthroscopy with medial menisectomy Right 03/01/2014    Procedure: KNEE ARTHROSCOPY WITH MEDIAL MENISECTOMY;  Surgeon: Meredith Pel, MD;  Location: [place];  Service: Orthopedics;  Laterality: Right;   Family History  Problem Relation Age of Onset  . Hypertension Mother   . Cancer Mother   . Heart disease Father   . Heart attack Father 22  . Hyperlipidemia Father   . Hypertension  Father   . Diabetes Sister   . Diabetes Brother    History  Substance Use Topics  . Smoking status: Current Some Day Smoker -- 0.25 packs/day for 32 years    Types: Cigarettes  . Smokeless tobacco: Never Used  . Alcohol Use: Yes     Comment: occasionally    Review of Systems  All other systems reviewed and are negative.     Allergies  Penicillins  Home Medications   Prior to Admission medications   Medication Sig Start Date End Date Taking? Authorizing Provider  albuterol (VENTOLIN HFA) 108 (90 BASE) MCG/ACT inhaler Inhale 2 puffs into the lungs every 6 (six) hours as needed for wheezing or shortness of breath.    Historical Provider, MD  co-enzyme Q-10 50 MG capsule Take 50 mg by mouth daily.    Historical Provider, MD  cyclobenzaprine (FLEXERIL) 10 MG tablet Take 10 mg by mouth 3 (three) times daily as needed for muscle spasms.    Historical Provider, MD  divalproex (DEPAKOTE ER) 500 MG 24 hr tablet Take 500 mg by mouth daily.    Historical Provider, MD  ezetimibe (ZETIA) 10 MG tablet Take  10 mg by mouth daily.    Historical Provider, MD  hydrochlorothiazide (MICROZIDE) 12.5 MG capsule Take 1 capsule (12.5 mg total) by mouth daily. 12/09/13   Amber Fidel Levy, MD  mirtazapine (REMERON) 15 MG tablet Take 15 mg by mouth at bedtime.    Historical Provider, MD  naproxen (NAPROSYN) 500 MG tablet Take 1 tablet (500 mg total) by mouth 2 (two) times daily. 02/02/14   Threasa Beards, MD  oxyCODONE-acetaminophen (PERCOCET) 10-325 MG per tablet Take 1-2 tablets by mouth every 6 (six) hours as needed for pain. 03/01/14   Meredith Pel, MD  pantoprazole (PROTONIX) 40 MG tablet Take 40 mg by mouth daily.    Historical Provider, MD  predniSONE (DELTASONE) 50 MG tablet Take 1 tablet (50 mg total) by mouth daily. 02/02/14   Threasa Beards, MD  risperiDONE (RISPERDAL) 1 MG tablet Take 1 mg by mouth 2 (two) times daily.    Historical Provider, MD  VITAMIN E PO Take 1 tablet by mouth daily.     Historical Provider, MD   BP 130/78 mmHg  Pulse 75  Temp(Src) 98.1 F (36.7 C) (Oral)  Resp 22  SpO2 96% Physical Exam  Constitutional: He is oriented to person, place, and time. He appears well-developed and well-nourished.  HENT:  Head: Normocephalic and atraumatic.  Eyes: EOM are normal.  Neck: Normal range of motion.  Cardiovascular: Normal rate, regular rhythm, normal heart sounds and intact distal pulses.   Pulmonary/Chest: Effort normal and breath sounds normal. No respiratory distress. He has no wheezes.  Abdominal: Soft. He exhibits no distension. There is no tenderness.  Genitourinary:  Rectal exam nontender.  No hemorrhoids.  No gross blood.  Light brown stool  Musculoskeletal: Normal range of motion.  Neurological: He is alert and oriented to person, place, and time.  Skin: Skin is warm and dry.  Psychiatric: He has a normal mood and affect. Judgment normal.  Nursing note and vitals reviewed.   ED Course  Procedures (including critical care time) Labs Review Labs Reviewed - No data to display  Imaging Review Dg Chest 2 View  09/04/2014   CLINICAL DATA:  Productive cough, left side chest pain  EXAM: CHEST  2 VIEW  COMPARISON:  02/02/2014  FINDINGS: Cardiomediastinal silhouette is stable. Again noticed severe bullous changes especially in right apex. No acute infiltrate or pulmonary edema. Bony thorax is unremarkable. Mild hyperinflation. Mild bullous changes and left apex.  IMPRESSION: No active disease. Mild hyperinflation again noted. Bullous changes especially in right apex again noted.   Electronically Signed   By: Lahoma Crocker M.D.   On: 09/04/2014 12:56     EKG Interpretation None      MDM   Final diagnoses:  SOB (shortness of breath)  Cough    Suspect bronchitis.  X-ray to evaluate for pneumonia.  Albuterol the help with cough.  Rectal exam demonstrates no hemorrhoids and no gross blood.  Abdominal exam benign.  Likely bronchitis which will be viral in  nature.  No indication for antibiotics unless his x-ray shows pneumonia.  Symptomatic treatment.  1:11 PM Pt improved at this time.     Hoy Morn, MD 09/04/14 1311

## 2014-09-04 NOTE — Discharge Instructions (Signed)
Cough, Adult  A cough is a reflex that helps clear your throat and airways. It can help heal the body or may be a reaction to an irritated airway. A cough may only last 2 or 3 weeks (acute) or may last more than 8 weeks (chronic).  CAUSES Acute cough:  Viral or bacterial infections. Chronic cough:  Infections.  Allergies.  Asthma.  Post-nasal drip.  Smoking.  Heartburn or acid reflux.  Some medicines.  Chronic lung problems (COPD).  Cancer. SYMPTOMS   Cough.  Fever.  Chest pain.  Increased breathing rate.  High-pitched whistling sound when breathing (wheezing).  Colored mucus that you cough up (sputum). TREATMENT   A bacterial cough may be treated with antibiotic medicine.  A viral cough must run its course and will not respond to antibiotics.  Your caregiver may recommend other treatments if you have a chronic cough. HOME CARE INSTRUCTIONS   Only take over-the-counter or prescription medicines for pain, discomfort, or fever as directed by your caregiver. Use cough suppressants only as directed by your caregiver.  Use a cold steam vaporizer or humidifier in your bedroom or home to help loosen secretions.  Sleep in a semi-upright position if your cough is worse at night.  Rest as needed.  Stop smoking if you smoke. SEEK IMMEDIATE MEDICAL CARE IF:   You have pus in your sputum.  Your cough starts to worsen.  You cannot control your cough with suppressants and are losing sleep.  You begin coughing up blood.  You have difficulty breathing.  You develop pain which is getting worse or is uncontrolled with medicine.  You have a fever. MAKE SURE YOU:   Understand these instructions.  Will watch your condition.  Will get help right away if you are not doing well or get worse. Document Released: 03/22/2011 Document Revised: 12/16/2011 Document Reviewed: 03/22/2011 Exit[employer] Patient Information 2015 [employer]. This information is not intended  to replace advice given to you by your health care provider. Make sure you discuss any questions you have with your health care provider.  

## 2014-09-16 DIAGNOSIS — F319 Bipolar disorder, unspecified: Secondary | ICD-10-CM | POA: Diagnosis not present

## 2014-10-12 DIAGNOSIS — M2241 Chondromalacia patellae, right knee: Secondary | ICD-10-CM | POA: Diagnosis not present

## 2014-10-12 DIAGNOSIS — S83281A Other tear of lateral meniscus, current injury, right knee, initial encounter: Secondary | ICD-10-CM | POA: Diagnosis not present

## 2014-10-12 DIAGNOSIS — M1711 Unilateral primary osteoarthritis, right knee: Secondary | ICD-10-CM | POA: Diagnosis not present

## 2014-10-12 DIAGNOSIS — M23321 Other meniscus derangements, posterior horn of medial meniscus, right knee: Secondary | ICD-10-CM | POA: Diagnosis not present

## 2014-10-20 ENCOUNTER — Encounter (HOSPITAL_COMMUNITY): Payer: Self-pay | Admitting: Orthopedic Surgery

## 2014-11-23 DIAGNOSIS — M2241 Chondromalacia patellae, right knee: Secondary | ICD-10-CM | POA: Diagnosis not present

## 2014-11-23 DIAGNOSIS — M1711 Unilateral primary osteoarthritis, right knee: Secondary | ICD-10-CM | POA: Diagnosis not present

## 2014-11-23 DIAGNOSIS — Z96651 Presence of right artificial knee joint: Secondary | ICD-10-CM | POA: Diagnosis not present

## 2014-11-23 DIAGNOSIS — M23321 Other meniscus derangements, posterior horn of medial meniscus, right knee: Secondary | ICD-10-CM | POA: Diagnosis not present

## 2014-11-26 ENCOUNTER — Encounter (HOSPITAL_COMMUNITY): Payer: Self-pay | Admitting: Emergency Medicine

## 2014-11-26 ENCOUNTER — Emergency Department (HOSPITAL_COMMUNITY): Payer: Medicare Other

## 2014-11-26 ENCOUNTER — Emergency Department (HOSPITAL_COMMUNITY)
Admission: EM | Admit: 2014-11-26 | Discharge: 2014-11-26 | Disposition: A | Discharge status: Home / Self Care | Payer: Medicare Other | Attending: Emergency Medicine | Admitting: Emergency Medicine

## 2014-11-26 DIAGNOSIS — Z7952 Long term (current) use of systemic steroids: Secondary | ICD-10-CM | POA: Diagnosis not present

## 2014-11-26 DIAGNOSIS — M1990 Unspecified osteoarthritis, unspecified site: Secondary | ICD-10-CM | POA: Diagnosis not present

## 2014-11-26 DIAGNOSIS — Z720 Tobacco use: Secondary | ICD-10-CM | POA: Insufficient documentation

## 2014-11-26 DIAGNOSIS — J449 Chronic obstructive pulmonary disease, unspecified: Secondary | ICD-10-CM | POA: Diagnosis not present

## 2014-11-26 DIAGNOSIS — R195 Other fecal abnormalities: Secondary | ICD-10-CM | POA: Diagnosis not present

## 2014-11-26 DIAGNOSIS — Z8701 Personal history of pneumonia (recurrent): Secondary | ICD-10-CM | POA: Diagnosis not present

## 2014-11-26 DIAGNOSIS — F319 Bipolar disorder, unspecified: Secondary | ICD-10-CM | POA: Diagnosis not present

## 2014-11-26 DIAGNOSIS — Z791 Long term (current) use of non-steroidal anti-inflammatories (NSAID): Secondary | ICD-10-CM | POA: Insufficient documentation

## 2014-11-26 DIAGNOSIS — Z9889 Other specified postprocedural states: Secondary | ICD-10-CM | POA: Diagnosis not present

## 2014-11-26 DIAGNOSIS — Z79899 Other long term (current) drug therapy: Secondary | ICD-10-CM | POA: Insufficient documentation

## 2014-11-26 DIAGNOSIS — Z880 Allergy status to penicillin: Secondary | ICD-10-CM | POA: Diagnosis not present

## 2014-11-26 DIAGNOSIS — K219 Gastro-esophageal reflux disease without esophagitis: Secondary | ICD-10-CM | POA: Insufficient documentation

## 2014-11-26 DIAGNOSIS — K921 Melena: Secondary | ICD-10-CM | POA: Diagnosis not present

## 2014-11-26 DIAGNOSIS — R109 Unspecified abdominal pain: Secondary | ICD-10-CM | POA: Diagnosis not present

## 2014-11-26 DIAGNOSIS — F259 Schizoaffective disorder, unspecified: Secondary | ICD-10-CM | POA: Insufficient documentation

## 2014-11-26 DIAGNOSIS — K5730 Diverticulosis of large intestine without perforation or abscess without bleeding: Secondary | ICD-10-CM | POA: Diagnosis not present

## 2014-11-26 DIAGNOSIS — F419 Anxiety disorder, unspecified: Secondary | ICD-10-CM | POA: Diagnosis not present

## 2014-11-26 DIAGNOSIS — K625 Hemorrhage of anus and rectum: Secondary | ICD-10-CM | POA: Diagnosis present

## 2014-11-26 DIAGNOSIS — I10 Essential (primary) hypertension: Secondary | ICD-10-CM | POA: Insufficient documentation

## 2014-11-26 HISTORY — DX: Chronic obstructive pulmonary disease, unspecified: J44.9

## 2014-11-26 HISTORY — DX: Headache, unspecified: R51.9

## 2014-11-26 HISTORY — DX: Headache: R51

## 2014-11-26 HISTORY — DX: Other psychoactive substance abuse, uncomplicated: F19.10

## 2014-11-26 LAB — I-STAT CHEM 8, ED
BUN: 13 mg/dL (ref 6–23)
Calcium, Ion: 1.19 mmol/L (ref 1.12–1.23)
Chloride: 101 mmol/L (ref 96–112)
Creatinine, Ser: 0.8 mg/dL (ref 0.50–1.35)
GLUCOSE: 102 mg/dL — AB (ref 70–99)
HCT: 52 % (ref 39.0–52.0)
Hemoglobin: 17.7 g/dL — ABNORMAL HIGH (ref 13.0–17.0)
Potassium: 4 mmol/L (ref 3.5–5.1)
Sodium: 140 mmol/L (ref 135–145)
TCO2: 23 mmol/L (ref 0–100)

## 2014-11-26 LAB — POC OCCULT BLOOD, ED: FECAL OCCULT BLD: NEGATIVE

## 2014-11-26 MED ORDER — CIPROFLOXACIN HCL 500 MG PO TABS: 500.0000 mg | ORAL_TABLET | Freq: Two times a day (BID) | ORAL | Status: DC

## 2014-11-26 MED ORDER — METRONIDAZOLE 500 MG PO TABS: 500.0000 mg | ORAL_TABLET | Freq: Three times a day (TID) | ORAL | Status: DC

## 2014-11-26 MED ORDER — IOHEXOL 300 MG/ML  SOLN
100.0000 mL | Freq: Once | INTRAMUSCULAR | Status: AC | PRN
  Administered 2014-11-26: 100 mL via INTRAVENOUS

## 2014-11-26 MED ORDER — IOHEXOL 300 MG/ML  SOLN
25.0000 mL | INTRAMUSCULAR | Status: DC | PRN
  Administered 2014-11-26: 25 mL via ORAL
  Filled 2014-11-26: qty 30

## 2014-11-26 NOTE — ED Notes (Signed)
Pt from home with c/o rectal bleeding starting this past Thurs with left sided abdominal pain. Normal bowel sounds throughout.  Pt reports hx of the same with dx of hemorrhoids this past year, increased bleeding this episode compared to last.  Pt states when he has a BM "it looks like I poured a cup of blood in the toilet."  Pt denies leaking blood between BMs, dizziness or lightheadedness, last BM yesterday evening was soft stool.  Pt additionally reports when he inhales deeply and coughs the mucus forward, it is blood tinged.  Pt ambulatory to room with ease, NAD, A&O.

## 2014-11-26 NOTE — Discharge Instructions (Signed)
These follow the directions provided. Be sure to follow-up with the GI doctor regarding your visit to the ER today. Please take the Miralax dissolved in your beverage of choice once a day to help soften your stools.  Your CT scan shows a thickening of the bowel wall related to inflammation. Take your antibiotics as directed until they are all gone. Don't hesitate to return for any new, worsening, or concerning symptoms.   SEEK IMMEDIATE MEDICAL CARE IF:  Your pain does not go away within 2 hours.  You keep throwing up (vomiting).  Your pain is felt only in portions of the abdomen, such as the right side or the left lower portion of the abdomen.  You pass bloody or black tarry stools.

## 2014-11-26 NOTE — ED Notes (Signed)
Patient transported to CT 

## 2014-11-26 NOTE — ED Provider Notes (Signed)
CSN: [id number]     Arrival date & time 11/26/14  1052 History   First MD Initiated Contact with Patient 11/26/14 1105     Chief Complaint  Patient presents with  . Rectal Bleeding   (Consider location/radiation/quality/duration/timing/severity/associated sxs/prior Treatment) HPI  Andrew [name] is a 49 yo male presenting with report of blood in stools.  He states he first noticed this 4 days ago when he had a bowel movement.  He reports his stool was hard and brown with streaks of blood in it but then blood continued to drip from his rectum the bowel movement. He notes a bloody bowel movement one time a day since Tuesday, the last bm was last night. He endorses some left upper and middle abd pain.   He denies any fevers, chills, nausea, vomiting or light-headedness.    Past Medical History  Diagnosis Date  . Bipolar disorder   . Umbilical hernia   . Anxiety   . Depression   . Schizoaffective disorder   . Hypertension     takes HCTZ daily  . Hyperlipidemia     takes Zetia daily  . History of bronchitis     1 month ago and treated with Amoxicillin and given an inhaler;takes Prenisone  . Pneumonia     hx of 2 yrs ago  . Generalized headaches     history of migraines-last one 02/02/14  . Dizziness   . Arthritis     right knee  . Joint pain   . Joint swelling   . Back pain     occasionally  . GERD (gastroesophageal reflux disease)     takes Prilosec daily as needed  . Headache   . Polysubstance abuse    Past Surgical History  Procedure Laterality Date  . Lipoma excision  ~ 2010    "back left side of my head"  . Esophagogastroduodenoscopy    . Knee arthroscopy with medial menisectomy Right 03/01/2014    Procedure: KNEE ARTHROSCOPY WITH MEDIAL MENISECTOMY;  Surgeon: Meredith Pel, MD;  Location: [facility];  Service: Orthopedics;  Laterality: Right;   Family History  Problem Relation Age of Onset  . Hypertension Mother   . Cancer Mother   . Heart disease Father   . Heart  attack Father 54  . Hyperlipidemia Father   . Hypertension Father   . Diabetes Sister   . Diabetes Brother    History  Substance Use Topics  . Smoking status: Current Some Day Smoker -- 0.25 packs/day for 32 years    Types: Cigarettes  . Smokeless tobacco: Never Used  . Alcohol Use: Yes     Comment: occasionally    Review of Systems  Constitutional: Negative for fever and chills.  HENT: Negative for sore throat.   Eyes: Negative for visual disturbance.  Respiratory: Negative for cough and shortness of breath.   Cardiovascular: Negative for chest pain and leg swelling.  Gastrointestinal: Positive for abdominal pain and blood in stool. Negative for nausea, vomiting and diarrhea.  Genitourinary: Negative for dysuria.  Musculoskeletal: Negative for myalgias.  Skin: Negative for rash.  Neurological: Negative for weakness, numbness and headaches.    Allergies  Penicillins  Home Medications   Prior to Admission medications   Medication Sig Start Date End Date Taking? Authorizing Provider  albuterol (PROVENTIL HFA;VENTOLIN HFA) 108 (90 BASE) MCG/ACT inhaler Inhale 2 puffs into the lungs every 4 (four) hours as needed for wheezing or shortness of breath. 09/04/14   Hoy Morn,  MD  albuterol (VENTOLIN HFA) 108 (90 BASE) MCG/ACT inhaler Inhale 2 puffs into the lungs every 6 (six) hours as needed for wheezing or shortness of breath.    Historical Provider, MD  co-enzyme Q-10 50 MG capsule Take 50 mg by mouth daily.    Historical Provider, MD  cyclobenzaprine (FLEXERIL) 10 MG tablet Take 10 mg by mouth 3 (three) times daily as needed for muscle spasms.    Historical Provider, MD  divalproex (DEPAKOTE ER) 500 MG 24 hr tablet Take 500 mg by mouth daily.    Historical Provider, MD  ezetimibe (ZETIA) 10 MG tablet Take 10 mg by mouth daily.    Historical Provider, MD  hydrochlorothiazide (MICROZIDE) 12.5 MG capsule Take 1 capsule (12.5 mg total) by mouth daily. 12/09/13   Amber Fidel Levy,  MD  mirtazapine (REMERON) 15 MG tablet Take 15 mg by mouth at bedtime.    Historical Provider, MD  naproxen (NAPROSYN) 500 MG tablet Take 1 tablet (500 mg total) by mouth 2 (two) times daily. 02/02/14   Threasa Beards, MD  oxyCODONE-acetaminophen (PERCOCET) 10-325 MG per tablet Take 1-2 tablets by mouth every 6 (six) hours as needed for pain. 03/01/14   Meredith Pel, MD  pantoprazole (PROTONIX) 40 MG tablet Take 40 mg by mouth daily.    Historical Provider, MD  predniSONE (DELTASONE) 50 MG tablet Take 1 tablet (50 mg total) by mouth daily. 02/02/14   Threasa Beards, MD  risperiDONE (RISPERDAL) 1 MG tablet Take 1 mg by mouth 2 (two) times daily.    Historical Provider, MD  VITAMIN E PO Take 1 tablet by mouth daily.    Historical Provider, MD   BP 116/83 mmHg  Pulse 71  Temp(Src) 98 F (36.7 C) (Oral)  Resp 18  SpO2 97% Physical Exam  Constitutional: He appears well-developed and well-nourished. No distress.  HENT:  Head: Normocephalic and atraumatic.  Mouth/Throat: Oropharynx is clear and moist. No oropharyngeal exudate.  Eyes: Conjunctivae are normal.  Neck: Neck supple. No thyromegaly present.  Cardiovascular: Normal rate, regular rhythm and intact distal pulses.   Pulmonary/Chest: Effort normal and breath sounds normal. No respiratory distress. He has no wheezes. He has no rales. He exhibits no tenderness.  Abdominal: Soft. He exhibits no distension and no mass. There is no hepatosplenomegaly. There is tenderness in the left upper quadrant. There is no rigidity, no rebound, no guarding, no CVA tenderness, no tenderness at McBurney's point and negative Murphy's sign.    Musculoskeletal: He exhibits no tenderness.  Lymphadenopathy:    He has no cervical adenopathy.  Neurological: He is alert.  Skin: Skin is warm and dry. No rash noted. He is not diaphoretic.  Psychiatric: He has a normal mood and affect.  Nursing note and vitals reviewed.   ED Course  Procedures (including  critical care time) Labs Review Labs Reviewed  I-STAT CHEM 8, ED - Abnormal; Notable for the following:    Glucose, Bld 102 (*)    Hemoglobin 17.7 (*)    All other components within normal limits  POC OCCULT BLOOD, ED    Imaging Review Ct Abdomen Pelvis W Contrast  11/26/2014   CLINICAL DATA:  Rectal bleeding starting Thursday, left side abdominal pain  EXAM: CT ABDOMEN AND PELVIS WITH CONTRAST  TECHNIQUE: Multidetector CT imaging of the abdomen and pelvis was performed using the standard protocol following bolus administration of intravenous contrast.  CONTRAST:  160mL OMNIPAQUE IOHEXOL 300 MG/ML  SOLN  COMPARISON:  12/18/2011  FINDINGS: Sagittal images of the spine are unremarkable. Lung bases shows mild dependent atelectasis posteriorly.  Mild hepatic fatty infiltration. No calcified gallstones are noted within gallbladder.  The pancreas, spleen and adrenal glands are unremarkable.  No aortic aneurysm.  Kidneys are symmetrical in size and enhancement. No hydronephrosis or hydroureter.  Delayed renal images shows bilateral renal symmetrical excretion.  No small bowel obstruction.  No ascites or free air.  No adenopathy.  Some stool noted in right colon and transverse colon. No pericecal inflammation. Normal appendix clearly visualize in axial image 65.  Scattered diverticula are noted distal left colon and sigmoid colon. There is no evidence of colitis or diverticulitis.  In axial image 63 there is mild thickening of distal rectal wall. Mild thickening of rectal anal wall is noted axial image 66. Hemorrhoids or proctitis cannot be excluded. Correlation with clinical exam and colonoscopy is recommended. No definite evidence of enhancing mass. Prostate gland measures 4.7 x 4 cm. The urinary bladder is unremarkable. Question tiny cyst in left seminal vesicle Measures 1.1 cm.  IMPRESSION: 1. Mild hepatic fatty infiltration. 2. No hydronephrosis or hydroureter. 3. In axial image 63 there is mild thickening  of distal rectal wall. Mild thickening of rectal anal wall is noted axial image 66. Hemorrhoids or proctitis cannot be excluded. Correlation with clinical exam and colonoscopy is recommended. No definite evidence of enhancing mass. 4. Diverticula are noted in distal left colon. Sigmoid colon diverticula are noted. No evidence of acute diverticulitis 5. No pericecal inflammation.  Normal appendix.   Electronically Signed   By: Lahoma Crocker M.D.   On: 11/26/2014 15:28   Dg Abd Acute W/chest  11/26/2014   CLINICAL DATA:  Left abdominal pain for the past 4 days.  EXAM: ACUTE ABDOMEN SERIES (ABDOMEN 2 VIEW & CHEST 1 VIEW)  COMPARISON:  Chest radiographs dated 09/04/2014 and abdomen and pelvis CT dated 12/18/2011.  FINDINGS: Normal sized heart. Stable biapical bullous changes, remaining greater on the right. Otherwise, clear lungs with stable mildly prominent interstitial markings. Thoracic spine degenerative changes. Mild thoracolumbar scoliosis.  Borderline dilated small bowel loops in the left mid abdomen, containing multiple air-fluid levels. Stool and gas in normal caliber colon.  IMPRESSION: 1. Mild ileus or minimal partial obstruction involving loops of jejunum in the left mid abdomen. 2. Stable changes of COPD.   Electronically Signed   By: Claudie Revering M.D.   On: 11/26/2014 13:45     EKG Interpretation None      MDM   Final diagnoses:  Abdominal pain, acute  Bloody stools   49 yo presenting with bloody stools and LUQ pain.  Hemoccult negative, I-stat 8 negative for significant abnormality.  Discussed case with Dr. Thurnell Garbe, abd x-ray done, showing small bowel loops concerning for partial small bowel obstruction.  CT abd/pelvis done, no bowel obstruction noted but Mild thickening of rectal anal wall is noted consistent with hemorrhoids or proctitis.  Discussed findings with pt. Prescription for cipro and flagyl.  Pt is well-appearing, in no acute distress and vital signs reviewed and not concerning.   They appear safe to be discharged.  Discharge include follow-up with GI.  Return precautions provided.  Pt aware of plan and in agreement.    Filed Vitals:   11/26/14 1430 11/26/14 1508 11/26/14 1509 11/26/14 1530  BP: 113/80 128/88  120/83  Pulse: 54  61 61  Temp:      TempSrc:      Resp:      SpO2:  99%  100% 98%   Meds given in ED:  Medications  iohexol (OMNIPAQUE) 300 MG/ML solution 100 mL (100 mLs Intravenous Contrast Given 11/26/14 1444)    Discharge Medication List as of 11/26/2014  3:48 PM    START taking these medications   Details  ciprofloxacin (CIPRO) 500 MG tablet Take 1 tablet (500 mg total) by mouth every 12 (twelve) hours., Starting 11/26/2014, Until Discontinued, Print    metroNIDAZOLE (FLAGYL) 500 MG tablet Take 1 tablet (500 mg total) by mouth 3 (three) times daily., Starting 11/26/2014, Until Discontinued, Print           Britt Bottom, NP 11/26/14 [place], DO 11/29/14 [phone]

## 2014-12-02 ENCOUNTER — Other Ambulatory Visit (HOSPITAL_COMMUNITY): Payer: Self-pay | Admitting: Orthopedic Surgery

## 2014-12-28 NOTE — Progress Notes (Addendum)
Spoke with Andrew [name] at surgeons office regarding pt PAT appointment scheduled for 12/29/14 for surgery dated 01/24/15. According to [name], it is our responsibility to call pt and reschedule PAT. Several unsuccessful attempts were made to contact pt via mobile number and work number; lvm on pt mobile number listed as # [phone].

## 2014-12-28 NOTE — Progress Notes (Signed)
Left voice message on pt home number as well  regarding PAT appt for 12/29/14

## 2014-12-28 NOTE — Progress Notes (Signed)
Spoke with pt regarding PAT; pt will contact Baker Janus ( surgical scheduler) tomorrow for new appointment.

## 2014-12-29 ENCOUNTER — Inpatient Hospital Stay (HOSPITAL_COMMUNITY)
Admission: RE | Admit: 2014-12-29 | Discharge: 2014-12-29 | Disposition: A | Discharge status: Home / Self Care | Payer: Medicare Other | Source: Ambulatory Visit

## 2015-01-04 DIAGNOSIS — M1711 Unilateral primary osteoarthritis, right knee: Secondary | ICD-10-CM | POA: Diagnosis not present

## 2015-01-16 ENCOUNTER — Inpatient Hospital Stay (HOSPITAL_COMMUNITY)
Admission: RE | Admit: 2015-01-16 | Discharge: 2015-01-16 | Disposition: A | Discharge status: Home / Self Care | Payer: Medicare Other | Source: Ambulatory Visit

## 2015-01-17 ENCOUNTER — Encounter (HOSPITAL_COMMUNITY): Payer: Self-pay

## 2015-01-17 ENCOUNTER — Encounter (HOSPITAL_COMMUNITY)
Admission: RE | Admit: 2015-01-17 | Discharge: 2015-01-17 | Disposition: A | Discharge status: Home / Self Care | Payer: Medicare Other | Source: Ambulatory Visit | Attending: Orthopedic Surgery | Admitting: Orthopedic Surgery

## 2015-01-17 DIAGNOSIS — Z01812 Encounter for preprocedural laboratory examination: Secondary | ICD-10-CM | POA: Diagnosis not present

## 2015-01-17 HISTORY — DX: Unspecified hemorrhoids: K64.9

## 2015-01-17 HISTORY — DX: Respiratory tuberculosis unspecified: A15.9

## 2015-01-17 LAB — CBC
HEMATOCRIT: 46.4 % (ref 39.0–52.0)
Hemoglobin: 16.4 g/dL (ref 13.0–17.0)
MCH: 32.2 pg (ref 26.0–34.0)
MCHC: 35.3 g/dL (ref 30.0–36.0)
MCV: 91 fL (ref 78.0–100.0)
Platelets: 292 10*3/uL (ref 150–400)
RBC: 5.1 MIL/uL (ref 4.22–5.81)
RDW: 13 % (ref 11.5–15.5)
WBC: 6 10*3/uL (ref 4.0–10.5)

## 2015-01-17 LAB — URINALYSIS, ROUTINE W REFLEX MICROSCOPIC
Bilirubin Urine: NEGATIVE
Glucose, UA: NEGATIVE mg/dL
HGB URINE DIPSTICK: NEGATIVE
Ketones, ur: NEGATIVE mg/dL
Leukocytes, UA: NEGATIVE
Nitrite: NEGATIVE
PROTEIN: NEGATIVE mg/dL
Specific Gravity, Urine: 1.025 (ref 1.005–1.030)
Urobilinogen, UA: 0.2 mg/dL (ref 0.0–1.0)
pH: 7 (ref 5.0–8.0)

## 2015-01-17 LAB — ABO/RH: ABO/RH(D): O POS

## 2015-01-17 LAB — COMPREHENSIVE METABOLIC PANEL
ALBUMIN: 3.7 g/dL (ref 3.5–5.2)
ALK PHOS: 82 U/L (ref 39–117)
ALT: 19 U/L (ref 0–53)
AST: 25 U/L (ref 0–37)
Anion gap: 8 (ref 5–15)
BUN: 11 mg/dL (ref 6–23)
CALCIUM: 9.3 mg/dL (ref 8.4–10.5)
CO2: 24 mmol/L (ref 19–32)
Chloride: 105 mmol/L (ref 96–112)
Creatinine, Ser: 0.8 mg/dL (ref 0.50–1.35)
GFR calc Af Amer: >90 mL/min (ref >90)
GFR calc non Af Amer: >90 mL/min (ref >90)
Glucose, Bld: 75 mg/dL (ref 70–99)
Potassium: 4.1 mmol/L (ref 3.5–5.1)
Sodium: 137 mmol/L (ref 135–145)
TOTAL PROTEIN: 6.6 g/dL (ref 6.0–8.3)
Total Bilirubin: 0.6 mg/dL (ref 0.3–1.2)

## 2015-01-17 LAB — PROTIME-INR
INR: 0.94 (ref 0.00–1.49)
Prothrombin Time: 12.7 seconds (ref 11.6–15.2)

## 2015-01-17 LAB — SURGICAL PCR SCREEN
MRSA, PCR: POSITIVE — AB
STAPHYLOCOCCUS AUREUS: POSITIVE — AB

## 2015-01-17 LAB — APTT: aPTT: 30 seconds (ref 24–37)

## 2015-01-17 LAB — TYPE AND SCREEN
ABO/RH(D): O POS
Antibody Screen: NEGATIVE

## 2015-01-17 NOTE — Pre-Procedure Instructions (Signed)
Andrew [name]  01/17/2015   Your procedure is scheduled on:  Tuesday, April 19.  Report to [facility] Admitting at 5:30 AM.   Call this number if you have problems the morning of surgery: [phone]               For any other questions, please call [phone], Monday - Friday 8 AM - 4 PM.   Remember:   Do not eat food or drink liquids after midnight Monday, April 18.   Take these medicines the morning of surgery with A SIP OF WATER: -   Do not wear jewelry, make-up or nail polish.  Do not wear lotions, powders, or perfumes.    Men may shave face and neck.  Do not bring valuables to the hospital.              [facility] is not responsible for any belongings or valuables.               Contacts, dentures or bridgework may not be worn into surgery.  Leave suitcase in the car. After surgery it may be brought to your room.  For patients admitted to the hospital, discharge time is determined by your treatment team.                 Special Instructions: Review  [place] - Preparing For Surgery.   Please read over the following fact sheets that you were given: Pain Booklet, Coughing and Deep Breathing, Blood Transfusion Information and Surgical Site Infection Prevention

## 2015-01-17 NOTE — Progress Notes (Signed)
Andrew [name] reported that he has not taken any of his regular medications "in a minute."  I asked him to define how long , he said a couple months.  "I just stopped taking Hydrocodone-acetaminophen a few days ago."  "I don't want anything to interfere with my surgery."  I asked patient what medications he normally takes, patient  loudly said "you got it in the computer", as he raised up from seat toward computer. I explained that  I need to review the medication on the list with him and he allowed me to read them to him.  Patient said he has not had any shortness of breath or chest pain.  Patient denies seeing a cardiologist.  Patient lives alone and does not have someone to assist him when he goes home, he is not sure who will drive him home.  "they are going to send someone to check on me, right'.  I told patient that it will be up to his Dr and physical therapy and to what surgery is done; to determine what needs he may have.   Patient reported that he did not go to joint class and refused to see video.  I did give patient a  Guide to Total Joint' booklet.

## 2015-01-17 NOTE — Progress Notes (Signed)
I called a prescription for Mupirocin ointment to [facility], [address], [place], [place].

## 2015-01-19 LAB — URINE CULTURE
CULTURE: NO GROWTH
Colony Count: NO GROWTH

## 2015-01-23 MED ORDER — CLINDAMYCIN PHOSPHATE 900 MG/50ML IV SOLN
900.0000 mg | INTRAVENOUS | Status: DC
  Filled 2015-01-23: qty 50

## 2015-01-23 MED ORDER — CHLORHEXIDINE GLUCONATE 4 % EX LIQD
60.0000 mL | Freq: Once | CUTANEOUS | Status: DC
  Filled 2015-01-23: qty 60

## 2015-01-24 ENCOUNTER — Encounter (HOSPITAL_COMMUNITY)
Admission: RE | Disposition: A | Discharge status: Skilled Nursing Facility | Payer: Self-pay | Source: Ambulatory Visit | Attending: Orthopedic Surgery

## 2015-01-24 ENCOUNTER — Inpatient Hospital Stay (HOSPITAL_COMMUNITY): Payer: Medicare Other | Admitting: Anesthesiology

## 2015-01-24 ENCOUNTER — Inpatient Hospital Stay (HOSPITAL_COMMUNITY)
Admission: RE | Admit: 2015-01-24 | Discharge: 2015-01-27 | DRG: 470 | Disposition: A | Discharge status: Skilled Nursing Facility | Payer: Medicare Other | Source: Ambulatory Visit | Attending: Orthopedic Surgery | Admitting: Orthopedic Surgery

## 2015-01-24 ENCOUNTER — Encounter (HOSPITAL_COMMUNITY): Payer: Self-pay | Admitting: *Deleted

## 2015-01-24 DIAGNOSIS — M2241 Chondromalacia patellae, right knee: Secondary | ICD-10-CM | POA: Diagnosis not present

## 2015-01-24 DIAGNOSIS — Z7952 Long term (current) use of systemic steroids: Secondary | ICD-10-CM

## 2015-01-24 DIAGNOSIS — F5221 Male erectile disorder: Secondary | ICD-10-CM | POA: Diagnosis present

## 2015-01-24 DIAGNOSIS — F319 Bipolar disorder, unspecified: Secondary | ICD-10-CM | POA: Diagnosis present

## 2015-01-24 DIAGNOSIS — Z880 Allergy status to penicillin: Secondary | ICD-10-CM | POA: Diagnosis not present

## 2015-01-24 DIAGNOSIS — F259 Schizoaffective disorder, unspecified: Secondary | ICD-10-CM | POA: Diagnosis present

## 2015-01-24 DIAGNOSIS — F419 Anxiety disorder, unspecified: Secondary | ICD-10-CM | POA: Diagnosis present

## 2015-01-24 DIAGNOSIS — Z79899 Other long term (current) drug therapy: Secondary | ICD-10-CM

## 2015-01-24 DIAGNOSIS — E780 Pure hypercholesterolemia: Secondary | ICD-10-CM | POA: Diagnosis present

## 2015-01-24 DIAGNOSIS — M1711 Unilateral primary osteoarthritis, right knee: Secondary | ICD-10-CM | POA: Diagnosis not present

## 2015-01-24 DIAGNOSIS — Z8611 Personal history of tuberculosis: Secondary | ICD-10-CM | POA: Diagnosis not present

## 2015-01-24 DIAGNOSIS — J449 Chronic obstructive pulmonary disease, unspecified: Secondary | ICD-10-CM | POA: Diagnosis present

## 2015-01-24 DIAGNOSIS — K219 Gastro-esophageal reflux disease without esophagitis: Secondary | ICD-10-CM | POA: Diagnosis present

## 2015-01-24 DIAGNOSIS — M179 Osteoarthritis of knee, unspecified: Secondary | ICD-10-CM | POA: Diagnosis not present

## 2015-01-24 DIAGNOSIS — E785 Hyperlipidemia, unspecified: Secondary | ICD-10-CM | POA: Diagnosis present

## 2015-01-24 DIAGNOSIS — M25561 Pain in right knee: Secondary | ICD-10-CM | POA: Diagnosis not present

## 2015-01-24 DIAGNOSIS — I10 Essential (primary) hypertension: Secondary | ICD-10-CM | POA: Diagnosis present

## 2015-01-24 DIAGNOSIS — Z91018 Allergy to other foods: Secondary | ICD-10-CM | POA: Diagnosis not present

## 2015-01-24 DIAGNOSIS — F17210 Nicotine dependence, cigarettes, uncomplicated: Secondary | ICD-10-CM | POA: Diagnosis present

## 2015-01-24 DIAGNOSIS — M1710 Unilateral primary osteoarthritis, unspecified knee: Secondary | ICD-10-CM | POA: Diagnosis present

## 2015-01-24 DIAGNOSIS — M549 Dorsalgia, unspecified: Secondary | ICD-10-CM | POA: Diagnosis not present

## 2015-01-24 HISTORY — PX: PARTIAL KNEE ARTHROPLASTY: SHX2174

## 2015-01-24 SURGERY — ARTHROPLASTY, KNEE, UNICOMPARTMENTAL
Anesthesia: Monitor Anesthesia Care | Site: Knee | Laterality: Right

## 2015-01-24 MED ORDER — POTASSIUM CHLORIDE IN NACL 20-0.9 MEQ/L-% IV SOLN
INTRAVENOUS | Status: AC
  Administered 2015-01-24: 15:00:00 via INTRAVENOUS
  Filled 2015-01-24 (×2): qty 1000

## 2015-01-24 MED ORDER — HYDROMORPHONE HCL 1 MG/ML IJ SOLN
INTRAMUSCULAR | Status: AC
  Administered 2015-01-24: 0.5 mg via INTRAVENOUS
  Filled 2015-01-24: qty 2

## 2015-01-24 MED ORDER — MENTHOL 3 MG MT LOZG: 1.0000 | LOZENGE | OROMUCOSAL | Status: DC | PRN

## 2015-01-24 MED ORDER — OXYCODONE HCL 5 MG PO TABS: 5.0000 mg | ORAL_TABLET | Freq: Once | ORAL | Status: DC | PRN

## 2015-01-24 MED ORDER — LIDOCAINE HCL (CARDIAC) 20 MG/ML IV SOLN
INTRAVENOUS | Status: AC
  Filled 2015-01-24: qty 5

## 2015-01-24 MED ORDER — BUPIVACAINE-EPINEPHRINE (PF) 0.25% -1:200000 IJ SOLN
INTRAMUSCULAR | Status: AC
  Filled 2015-01-24: qty 30

## 2015-01-24 MED ORDER — ACETAMINOPHEN 325 MG PO TABS
650.0000 mg | ORAL_TABLET | Freq: Four times a day (QID) | ORAL | Status: DC | PRN
  Administered 2015-01-24 – 2015-01-25 (×2): 650 mg via ORAL
  Filled 2015-01-24 (×2): qty 2

## 2015-01-24 MED ORDER — PHENOL 1.4 % MT LIQD: 1.0000 | OROMUCOSAL | Status: DC | PRN

## 2015-01-24 MED ORDER — LACTATED RINGERS IV SOLN
INTRAVENOUS | Status: DC | PRN
  Administered 2015-01-24 (×2): via INTRAVENOUS

## 2015-01-24 MED ORDER — PROMETHAZINE HCL 25 MG/ML IJ SOLN
INTRAMUSCULAR | Status: AC
  Administered 2015-01-24: 6.25 mg via INTRAVENOUS
  Filled 2015-01-24: qty 1

## 2015-01-24 MED ORDER — OXYCODONE HCL 5 MG PO TABS
ORAL_TABLET | ORAL | Status: AC
  Administered 2015-01-24: 10 mg via ORAL
  Filled 2015-01-24: qty 2

## 2015-01-24 MED ORDER — EPHEDRINE SULFATE 50 MG/ML IJ SOLN
INTRAMUSCULAR | Status: AC
  Filled 2015-01-24: qty 1

## 2015-01-24 MED ORDER — CLINDAMYCIN PHOSPHATE 900 MG/50ML IV SOLN
INTRAVENOUS | Status: DC | PRN
  Administered 2015-01-24: 900 mg via INTRAVENOUS

## 2015-01-24 MED ORDER — CHLORHEXIDINE GLUCONATE CLOTH 2 % EX PADS
6.0000 | MEDICATED_PAD | Freq: Every day | CUTANEOUS | Status: DC
  Administered 2015-01-25 – 2015-01-27 (×3): 6 via TOPICAL

## 2015-01-24 MED ORDER — MUPIROCIN 2 % EX OINT
1.0000 "application " | TOPICAL_OINTMENT | Freq: Two times a day (BID) | CUTANEOUS | Status: DC
  Administered 2015-01-25 – 2015-01-27 (×5): 1 via NASAL
  Filled 2015-01-24: qty 22

## 2015-01-24 MED ORDER — HYDROMORPHONE HCL 1 MG/ML IJ SOLN
1.0000 mg | INTRAMUSCULAR | Status: DC | PRN
  Administered 2015-01-25 (×3): 1 mg via INTRAVENOUS
  Filled 2015-01-24 (×3): qty 1

## 2015-01-24 MED ORDER — ACETAMINOPHEN 325 MG PO TABS
ORAL_TABLET | ORAL | Status: AC
  Administered 2015-01-24: 650 mg via ORAL
  Filled 2015-01-24: qty 2

## 2015-01-24 MED ORDER — ACETAMINOPHEN 650 MG RE SUPP
650.0000 mg | Freq: Four times a day (QID) | RECTAL | Status: DC | PRN
  Filled 2015-01-24: qty 1

## 2015-01-24 MED ORDER — OXYCODONE HCL 5 MG/5ML PO SOLN: 5.0000 mg | Freq: Once | ORAL | Status: DC | PRN

## 2015-01-24 MED ORDER — MORPHINE SULFATE 4 MG/ML IJ SOLN
INTRAMUSCULAR | Status: DC | PRN
  Administered 2015-01-24: 8 mg via SUBCUTANEOUS

## 2015-01-24 MED ORDER — ONDANSETRON HCL 4 MG PO TABS: 4.0000 mg | ORAL_TABLET | Freq: Four times a day (QID) | ORAL | Status: DC | PRN

## 2015-01-24 MED ORDER — RIVAROXABAN 20 MG PO TABS
10.0000 mg | ORAL_TABLET | Freq: Every day | ORAL | Status: DC
  Administered 2015-01-25 – 2015-01-27 (×3): 10 mg via ORAL
  Filled 2015-01-24 (×3): qty 1

## 2015-01-24 MED ORDER — HYDROMORPHONE HCL 1 MG/ML IJ SOLN
0.2500 mg | INTRAMUSCULAR | Status: DC | PRN
  Administered 2015-01-24 (×4): 0.5 mg via INTRAVENOUS

## 2015-01-24 MED ORDER — SODIUM CHLORIDE 0.9 % IJ SOLN
INTRAMUSCULAR | Status: DC | PRN
  Administered 2015-01-24: 40 mL

## 2015-01-24 MED ORDER — MIDAZOLAM HCL 2 MG/2ML IJ SOLN
INTRAMUSCULAR | Status: AC
  Filled 2015-01-24: qty 2

## 2015-01-24 MED ORDER — PROPOFOL 10 MG/ML IV BOLUS
INTRAVENOUS | Status: DC | PRN
  Administered 2015-01-24: 10 mg via INTRAVENOUS
  Administered 2015-01-24 (×3): 20 mg via INTRAVENOUS

## 2015-01-24 MED ORDER — METOCLOPRAMIDE HCL 5 MG PO TABS: 5.0000 mg | ORAL_TABLET | Freq: Three times a day (TID) | ORAL | Status: DC | PRN

## 2015-01-24 MED ORDER — FENTANYL CITRATE (PF) 100 MCG/2ML IJ SOLN
INTRAMUSCULAR | Status: DC | PRN
  Administered 2015-01-24 (×2): 50 ug via INTRAVENOUS

## 2015-01-24 MED ORDER — PROPOFOL 10 MG/ML IV BOLUS
INTRAVENOUS | Status: AC
  Filled 2015-01-24: qty 20

## 2015-01-24 MED ORDER — STERILE WATER FOR INJECTION IJ SOLN
INTRAMUSCULAR | Status: AC
  Filled 2015-01-24: qty 10

## 2015-01-24 MED ORDER — ONDANSETRON HCL 4 MG/2ML IJ SOLN: 4.0000 mg | Freq: Four times a day (QID) | INTRAMUSCULAR | Status: DC | PRN

## 2015-01-24 MED ORDER — BUPIVACAINE IN DEXTROSE 0.75-8.25 % IT SOLN
INTRATHECAL | Status: DC | PRN
  Administered 2015-01-24: 15 mg via INTRATHECAL

## 2015-01-24 MED ORDER — CLONIDINE HCL (ANALGESIA) 100 MCG/ML EP SOLN
EPIDURAL | Status: DC | PRN
  Administered 2015-01-24: 100 ug

## 2015-01-24 MED ORDER — BUPIVACAINE LIPOSOME 1.3 % IJ SUSP
INTRAMUSCULAR | Status: DC | PRN
  Administered 2015-01-24: 20 mL

## 2015-01-24 MED ORDER — METOCLOPRAMIDE HCL 5 MG/ML IJ SOLN: 5.0000 mg | Freq: Three times a day (TID) | INTRAMUSCULAR | Status: DC | PRN

## 2015-01-24 MED ORDER — SUCCINYLCHOLINE CHLORIDE 20 MG/ML IJ SOLN
INTRAMUSCULAR | Status: AC
  Filled 2015-01-24: qty 1

## 2015-01-24 MED ORDER — BUPIVACAINE LIPOSOME 1.3 % IJ SUSP
20.0000 mL | INTRAMUSCULAR | Status: DC
  Filled 2015-01-24: qty 20

## 2015-01-24 MED ORDER — ROCURONIUM BROMIDE 50 MG/5ML IV SOLN
INTRAVENOUS | Status: AC
  Filled 2015-01-24: qty 1

## 2015-01-24 MED ORDER — MORPHINE SULFATE 4 MG/ML IJ SOLN
INTRAMUSCULAR | Status: AC
  Filled 2015-01-24: qty 2

## 2015-01-24 MED ORDER — PROPOFOL INFUSION 10 MG/ML OPTIME
INTRAVENOUS | Status: DC | PRN
  Administered 2015-01-24: 100 ug/kg/min via INTRAVENOUS

## 2015-01-24 MED ORDER — FENTANYL CITRATE (PF) 250 MCG/5ML IJ SOLN
INTRAMUSCULAR | Status: AC
  Filled 2015-01-24: qty 5

## 2015-01-24 MED ORDER — CLONIDINE HCL (ANALGESIA) 100 MCG/ML EP SOLN
150.0000 ug | EPIDURAL | Status: DC
  Filled 2015-01-24: qty 1.5

## 2015-01-24 MED ORDER — HYDROCHLOROTHIAZIDE 12.5 MG PO CAPS
12.5000 mg | ORAL_CAPSULE | Freq: Every day | ORAL | Status: DC
Start: 2015-01-24 — End: 2015-01-24

## 2015-01-24 MED ORDER — VANCOMYCIN HCL 1000 MG IV SOLR
1000.0000 mg | INTRAVENOUS | Status: DC | PRN
  Administered 2015-01-24: 1000 mg via INTRAVENOUS

## 2015-01-24 MED ORDER — MIDAZOLAM HCL 5 MG/5ML IJ SOLN
INTRAMUSCULAR | Status: DC | PRN
  Administered 2015-01-24 (×2): 2 mg via INTRAVENOUS

## 2015-01-24 MED ORDER — LIDOCAINE HCL (CARDIAC) 20 MG/ML IV SOLN
INTRAVENOUS | Status: DC | PRN
  Administered 2015-01-24: 20 mg via INTRAVENOUS
  Administered 2015-01-24 (×2): 40 mg via INTRAVENOUS

## 2015-01-24 MED ORDER — OXYCODONE HCL 5 MG PO TABS
5.0000 mg | ORAL_TABLET | ORAL | Status: DC | PRN
  Administered 2015-01-24 – 2015-01-27 (×9): 10 mg via ORAL
  Filled 2015-01-24 (×8): qty 2

## 2015-01-24 MED ORDER — VANCOMYCIN HCL IN DEXTROSE 1-5 GM/200ML-% IV SOLN
1000.0000 mg | INTRAVENOUS | Status: DC
  Filled 2015-01-24: qty 200

## 2015-01-24 MED ORDER — PROMETHAZINE HCL 25 MG/ML IJ SOLN
6.2500 mg | INTRAMUSCULAR | Status: DC | PRN
  Administered 2015-01-24: 6.25 mg via INTRAVENOUS

## 2015-01-24 MED ORDER — TRANEXAMIC ACID 1000 MG/10ML IV SOLN
1000.0000 mg | INTRAVENOUS | Status: AC
  Administered 2015-01-24: 1000 mg via INTRAVENOUS
  Filled 2015-01-24: qty 10

## 2015-01-24 MED ORDER — VANCOMYCIN HCL IN DEXTROSE 1-5 GM/200ML-% IV SOLN
1000.0000 mg | Freq: Two times a day (BID) | INTRAVENOUS | Status: AC
  Administered 2015-01-24: 1000 mg via INTRAVENOUS
  Filled 2015-01-24: qty 200

## 2015-01-24 MED ORDER — 0.9 % SODIUM CHLORIDE (POUR BTL) OPTIME
TOPICAL | Status: DC | PRN
  Administered 2015-01-24: 1000 mL

## 2015-01-24 SURGICAL SUPPLY — 76 items
ALCOHOL 70% 16 OZ (MISCELLANEOUS) ×3 IMPLANT
BANDAGE ELASTIC 4 VELCRO ST LF (GAUZE/BANDAGES/DRESSINGS) ×3 IMPLANT
BANDAGE ELASTIC 6 VELCRO ST LF (GAUZE/BANDAGES/DRESSINGS) ×2 IMPLANT
BANDAGE ESMARK 6X9 LF (GAUZE/BANDAGES/DRESSINGS) ×1 IMPLANT
BLADE SAG 18X100X1.27 (BLADE) ×2 IMPLANT
BNDG CMPR 9X6 STRL LF SNTH (GAUZE/BANDAGES/DRESSINGS) ×1
BNDG CMPR MED 15X6 ELC VLCR LF (GAUZE/BANDAGES/DRESSINGS) ×2
BNDG COHESIVE 6X5 TAN STRL LF (GAUZE/BANDAGES/DRESSINGS) ×3 IMPLANT
BNDG ELASTIC 6X15 VLCR STRL LF (GAUZE/BANDAGES/DRESSINGS) ×7 IMPLANT
BNDG ESMARK 6X9 LF (GAUZE/BANDAGES/DRESSINGS) ×3
BOWL SMART MIX CTS (DISPOSABLE) ×3 IMPLANT
CAPT KNEE TRIATH TK-4 ×2 IMPLANT
CEMENT BONE SIMPLEX SPEEDSET (Cement) ×4 IMPLANT
COVER SURGICAL LIGHT HANDLE (MISCELLANEOUS) ×3 IMPLANT
CUFF TOURNIQUET SINGLE 34IN LL (TOURNIQUET CUFF) ×3 IMPLANT
CUFF TOURNIQUET SINGLE 44IN (TOURNIQUET CUFF) IMPLANT
DRAPE IMP U-DRAPE 54X76 (DRAPES) ×3 IMPLANT
DRAPE INCISE IOBAN 66X45 STRL (DRAPES) IMPLANT
DRAPE ORTHO SPLIT 77X108 STRL (DRAPES) ×9
DRAPE SURG ORHT 6 SPLT 77X108 (DRAPES) ×3 IMPLANT
DRAPE U-SHAPE 47X51 STRL (DRAPES) ×3 IMPLANT
DRSG PAD ABDOMINAL 8X10 ST (GAUZE/BANDAGES/DRESSINGS) ×4 IMPLANT
DURAPREP 26ML APPLICATOR (WOUND CARE) ×3 IMPLANT
ELECT REM PT RETURN 9FT ADLT (ELECTROSURGICAL) ×3
ELECTRODE REM PT RTRN 9FT ADLT (ELECTROSURGICAL) ×1 IMPLANT
FACESHIELD WRAPAROUND (MASK) ×3 IMPLANT
FACESHIELD WRAPAROUND OR TEAM (MASK) ×1 IMPLANT
GAUZE SPONGE 4X4 12PLY STRL (GAUZE/BANDAGES/DRESSINGS) ×3 IMPLANT
GAUZE XEROFORM 5X9 LF (GAUZE/BANDAGES/DRESSINGS) ×3 IMPLANT
GLOVE BIOGEL PI IND STRL 8 (GLOVE) ×1 IMPLANT
GLOVE BIOGEL PI INDICATOR 8 (GLOVE) ×4
GLOVE SURG ORTHO 8.0 STRL STRW (GLOVE) ×7 IMPLANT
GOWN STRL REUS W/ TWL LRG LVL3 (GOWN DISPOSABLE) ×3 IMPLANT
GOWN STRL REUS W/ TWL XL LVL3 (GOWN DISPOSABLE) ×1 IMPLANT
GOWN STRL REUS W/TWL LRG LVL3 (GOWN DISPOSABLE) ×9
GOWN STRL REUS W/TWL XL LVL3 (GOWN DISPOSABLE) ×3
HANDPIECE INTERPULSE COAX TIP (DISPOSABLE) ×3
HOOD PEEL AWAY FACE SHEILD DIS (HOOD) ×11 IMPLANT
IMMOBILIZER KNEE 20 (SOFTGOODS) IMPLANT
IMMOBILIZER KNEE 22 UNIV (SOFTGOODS) IMPLANT
IMMOBILIZER KNEE 24 THIGH 36 (MISCELLANEOUS) IMPLANT
IMMOBILIZER KNEE 24 UNIV (MISCELLANEOUS)
KIT BASIN OR (CUSTOM PROCEDURE TRAY) ×3 IMPLANT
KIT ROOM TURNOVER OR (KITS) ×3 IMPLANT
MANIFOLD NEPTUNE II (INSTRUMENTS) ×3 IMPLANT
NDL 18GX1X1/2 (RX/OR ONLY) (NEEDLE) ×1 IMPLANT
NDL SPNL 18GX3.5 QUINCKE PK (NEEDLE) ×1 IMPLANT
NEEDLE 18GX1X1/2 (RX/OR ONLY) (NEEDLE) ×3 IMPLANT
NEEDLE HYPO 22GX1.5 SAFETY (NEEDLE) ×6 IMPLANT
NEEDLE SPNL 18GX3.5 QUINCKE PK (NEEDLE) ×3 IMPLANT
NS IRRIG 1000ML POUR BTL (IV SOLUTION) ×6 IMPLANT
PACK TOTAL JOINT (CUSTOM PROCEDURE TRAY) ×3 IMPLANT
PACK UNIVERSAL I (CUSTOM PROCEDURE TRAY) ×3 IMPLANT
PAD ARMBOARD 7.5X6 YLW CONV (MISCELLANEOUS) ×6 IMPLANT
PAD CAST 4YDX4 CTTN HI CHSV (CAST SUPPLIES) ×1 IMPLANT
PADDING CAST ABS 4INX4YD NS (CAST SUPPLIES) ×2
PADDING CAST ABS COTTON 4X4 ST (CAST SUPPLIES) IMPLANT
PADDING CAST COTTON 4X4 STRL (CAST SUPPLIES) ×3
PADDING CAST COTTON 6X4 STRL (CAST SUPPLIES) ×5 IMPLANT
SET HNDPC FAN SPRY TIP SCT (DISPOSABLE) ×1 IMPLANT
SOLUTION BETADINE 4OZ (MISCELLANEOUS) ×3 IMPLANT
SPONGE GAUZE 4X4 12PLY STER LF (GAUZE/BANDAGES/DRESSINGS) ×2 IMPLANT
SPONGE LAP 18X18 X RAY DECT (DISPOSABLE) IMPLANT
STAPLER VISISTAT 35W (STAPLE) IMPLANT
SUCTION FRAZIER TIP 10 FR DISP (SUCTIONS) ×3 IMPLANT
SUT VIC AB 0 CTB1 27 (SUTURE) ×9 IMPLANT
SUT VIC AB 1 CT1 27 (SUTURE) ×15
SUT VIC AB 1 CT1 27XBRD ANBCTR (SUTURE) ×5 IMPLANT
SUT VIC AB 2-0 CT1 27 (SUTURE) ×6
SUT VIC AB 2-0 CT1 TAPERPNT 27 (SUTURE) ×2 IMPLANT
SYR 30ML LL (SYRINGE) ×3 IMPLANT
SYR TB 1ML LUER SLIP (SYRINGE) ×3 IMPLANT
TOWEL OR 17X24 6PK STRL BLUE (TOWEL DISPOSABLE) ×6 IMPLANT
TOWEL OR 17X26 10 PK STRL BLUE (TOWEL DISPOSABLE) ×6 IMPLANT
TRAY FOLEY CATH 16FRSI W/METER (SET/KITS/TRAYS/PACK) IMPLANT
WATER STERILE IRR 1000ML POUR (IV SOLUTION) ×2 IMPLANT

## 2015-01-24 NOTE — Anesthesia Preprocedure Evaluation (Addendum)
Anesthesia Evaluation  Patient identified by MRN, date of birth, ID band Patient awake    Reviewed: Allergy & Precautions, NPO status , Patient's Chart, lab work & pertinent test results  Airway Mallampati: II  TM Distance: >3 FB Neck ROM: Full    Dental  (+) Missing, Dental Advisory Given   Pulmonary COPD COPD inhaler, Current Smoker,    Pulmonary exam normal       Cardiovascular hypertension,     Neuro/Psych  Headaches, PSYCHIATRIC DISORDERS Anxiety Depression Bipolar Disorder Schizophrenia    GI/Hepatic Neg liver ROS, GERD-  ,  Endo/Other  negative endocrine ROS  Renal/GU negative Renal ROS     Musculoskeletal   Abdominal   Peds  Hematology negative hematology ROS (+)   Anesthesia Other Findings   Reproductive/Obstetrics                            Anesthesia Physical Anesthesia Plan  ASA: III  Anesthesia Plan: MAC and Spinal   Post-op Pain Management:    Induction:   Airway Management Planned: Simple Face Mask  Additional Equipment:   Intra-op Plan:   Post-operative Plan:   Informed Consent: I have reviewed the patients History and Physical, chart, labs and discussed the procedure including the risks, benefits and alternatives for the proposed anesthesia with the patient or authorized representative who has indicated his/her understanding and acceptance.   Dental advisory given  Plan Discussed with: CRNA, Anesthesiologist and Surgeon  Anesthesia Plan Comments:        Anesthesia Quick Evaluation

## 2015-01-24 NOTE — Progress Notes (Signed)
Md made aware that patient is wanting to go home.will wait for further instruction from the MD.

## 2015-01-24 NOTE — Progress Notes (Signed)
Orthopedic Tech Progress Note Patient Details:  ZOE NORDIN [birth year] [id number]  CPM Right Knee CPM Right Knee: On Right Knee Flexion (Degrees): 60 Right Knee Extension (Degrees): 0 Additional Comments: trapeze bar patient helper Viewed order from doctor's order list  Hildred Priest 01/24/2015, 12:21 PM

## 2015-01-24 NOTE — Transfer of Care (Signed)
Immediate Anesthesia Transfer of Care Note  Patient: Andrew [name]  Procedure(s) Performed: Procedure(s) with comments: RIGHT KNEE MEDIAL COMPARTMENT REPLACEMENT VS TOTAL KNEE ARTHROPLASTY. (Right) - RIGHT KNEE MEDIAL COMPARTMENT REPLACEMENT VS TOTAL KNEE ARTHROPLASTY.  Patient Location: PACU  Anesthesia Type:MAC and Spinal  Level of Consciousness: awake, alert , oriented and sedated  Airway & Oxygen Therapy: Patient Spontanous Breathing and Patient connected to nasal cannula oxygen  Post-op Assessment: Report given to RN and Post -op Vital signs reviewed and stable  Post vital signs: Reviewed and stable  Last Vitals:  Filed Vitals:   01/24/15 0639  BP: 127/75  Pulse: 72  Temp: 36.7 C  Resp: 20    Complications: No apparent anesthesia complications

## 2015-01-24 NOTE — Brief Op Note (Signed)
01/24/2015  10:49 AM  PATIENT:  Andrew [name]  49 y.o. male  PRE-OPERATIVE DIAGNOSIS:  RIGHT KNEE OSTEOARTHRITIS  POST-OPERATIVE DIAGNOSIS:  same  PROCEDURE:  Procedure(s):  TOTAL KNEE ARTHROPLASTY.  SURGEON:  Surgeon(s): Meredith Pel, MD  ASSISTANT: carla bethune rnfa  ANESTHESIA:   spinal  EBL: 100 ml    Total I/O In: 1700 [I.V.:1700] Out: -   BLOOD ADMINISTERED: none  DRAINS: none   LOCAL MEDICATIONS USED:  none  SPECIMEN:  No Specimen  COUNTS:  YES  TOURNIQUET:   Total Tourniquet Time Documented: Thigh (Right) - 117 minutes Total: Thigh (Right) - 117 minutes   DICTATION: .Other Dictation: Dictation Number [id number]  PLAN OF CARE: Admit to inpatient   PATIENT DISPOSITION:  PACU - hemodynamically stable

## 2015-01-24 NOTE — H&P (Signed)
TOTAL KNEE ADMISSION H&P  Patient is being admitted for right uni vs total knee arthroplasty.  Subjective:  Chief Complaint:right knee pain.  HPI: Andrew [name], 49 y.o. male, has a history of pain and functional disability in the right knee due to arthritis and has failed non-surgical conservative treatments for greater than 12 weeks to includeNSAID's and/or analgesics, corticosteriod injections, flexibility and strengthening excercises, use of assistive devices and activity modification.  Onset of symptoms was gradual, starting 8 years ago with gradually worsening course since that time. The patient noted prior procedures on the knee to include  menisectomy on the right knee(s).  Patient currently rates pain in the right knee(s) at 9 out of 10 with activity. Patient has night pain, worsening of pain with activity and weight bearing, pain that interferes with activities of daily living, pain with passive range of motion, crepitus and joint swelling.  Patient has evidence of periarticular osteophytes and joint space narrowing by imaging studies. This patient has had pain that interferes with working capability. There is no active infection.  Patient Active Problem List   Diagnosis Date Noted  . Right knee pain 01/07/2014  . Concern about STD in male without diagnosis 01/06/2014  . Dysuria 12/09/2013  . High cholesterol 10/25/2013  . GERD (gastroesophageal reflux disease) 09/20/2013  . History of positive PPD 07/17/2012  . Cocaine abuse 04/02/2012  . Rhabdomyolysis 04/02/2012  . Hernia of abdominal wall 01/05/2011  . ERECTILE DYSFUNCTION, PSYCHOGENIC 02/13/2010  . BIPOLAR DISORDER UNSPECIFIED 01/31/2009  . TOBACCO ABUSE 01/31/2009  . ELEVATED BP READING WITHOUT DX HYPERTENSION 01/31/2009   Past Medical History  Diagnosis Date  . Umbilical hernia   . Anxiety   . Depression   . Hypertension     takes HCTZ daily  . Hyperlipidemia     takes Zetia daily  . History of bronchitis     1  month ago and treated with Amoxicillin and given an inhaler;takes Prenisone  . Pneumonia     hx of 2 yrs ago  . Generalized headaches     history of migraines-last one 02/02/14  . Dizziness   . Arthritis     right knee  . Joint pain   . Joint swelling   . Back pain     occasionally  . GERD (gastroesophageal reflux disease)     takes Prilosec daily as needed  . Headache   . Polysubstance abuse   . COPD (chronic obstructive pulmonary disease)   . Tuberculosis     treated for 1 year  . Bipolar disorder     denies  . Schizoaffective disorder     denies  . Hemorrhoids     Past Surgical History  Procedure Laterality Date  . Lipoma excision  ~ 2010    "back left side of my head"  . Esophagogastroduodenoscopy    . Knee arthroscopy with medial menisectomy Right 03/01/2014    Procedure: KNEE ARTHROSCOPY WITH MEDIAL MENISECTOMY;  Surgeon: Meredith Pel, MD;  Location: [facility];  Service: Orthopedics;  Laterality: Right;    Prescriptions prior to admission  Medication Sig Dispense Refill Last Dose  . HYDROcodone-acetaminophen (NORCO/VICODIN) 5-325 MG per tablet Take 1 tablet by mouth every 6 (six) hours as needed for moderate pain.   Past Month at Unknown time  . albuterol (PROVENTIL HFA;VENTOLIN HFA) 108 (90 BASE) MCG/ACT inhaler Inhale 2 puffs into the lungs every 4 (four) hours as needed for wheezing or shortness of breath. (Patient not taking: Reported on  01/16/2015) 1 Inhaler 0 Not Taking at Unknown time  . ciprofloxacin (CIPRO) 500 MG tablet Take 1 tablet (500 mg total) by mouth every 12 (twelve) hours. (Patient not taking: Reported on 01/16/2015) 14 tablet 0 Not Taking at Unknown time  . hydrochlorothiazide (MICROZIDE) 12.5 MG capsule Take 1 capsule (12.5 mg total) by mouth daily. (Patient not taking: Reported on 11/26/2014) 30 capsule 11 Not Taking at Unknown time  . metroNIDAZOLE (FLAGYL) 500 MG tablet Take 1 tablet (500 mg total) by mouth 3 (three) times daily. (Patient not taking:  Reported on 01/16/2015) 21 tablet 0 Not Taking at Unknown time  . naproxen (NAPROSYN) 500 MG tablet Take 1 tablet (500 mg total) by mouth 2 (two) times daily. (Patient not taking: Reported on 11/26/2014) 30 tablet 0 Past Week at Unknown time  . oxyCODONE-acetaminophen (PERCOCET) 10-325 MG per tablet Take 1-2 tablets by mouth every 6 (six) hours as needed for pain. (Patient not taking: Reported on 11/26/2014) 60 tablet 0 Not Taking at Unknown time  . predniSONE (DELTASONE) 50 MG tablet Take 1 tablet (50 mg total) by mouth daily. (Patient not taking: Reported on 11/26/2014) 4 tablet 0 Not Taking at Unknown time   Allergies  Allergen Reactions  . Penicillins Hives  . Mushroom Extract Complex Hives    History  Substance Use Topics  . Smoking status: Current Some Day Smoker -- 0.25 packs/day for 32 years    Types: Cigarettes  . Smokeless tobacco: Never Used  . Alcohol Use: Yes     Comment: occasionally     Family History  Problem Relation Age of Onset  . Hypertension Mother   . Cancer Mother   . Heart disease Father   . Heart attack Father 74  . Hyperlipidemia Father   . Hypertension Father   . Diabetes Sister   . Diabetes Brother      Review of Systems  Constitutional: Negative.   HENT: Negative.   Eyes: Negative.   Respiratory: Negative.   Cardiovascular: Negative.   Gastrointestinal: Negative.   Genitourinary: Negative.   Musculoskeletal: Positive for joint pain.  Skin: Negative.   Neurological: Negative.   Endo/Heme/Allergies: Negative.   Psychiatric/Behavioral: Negative.     Objective:  Physical Exam  Constitutional: He appears well-developed.  HENT:  Head: Normocephalic.  Eyes: Pupils are equal, round, and reactive to light.  Neck: Normal range of motion.  Cardiovascular: Normal rate.   Respiratory: Effort normal.  Neurological: He is alert.  Skin: Skin is warm.  Psychiatric: He has a normal mood and affect.  varuis alignment palpable pedal pulses good range of  motion stable collateral cruciate ligaments intact skin around the left knee region  Vital signs in last 24 hours: Temp:  [98.1 F (36.7 C)] 98.1 F (36.7 C) (04/19 0639) Pulse Rate:  [72] 72 (04/19 0639) Resp:  [20] 20 (04/19 0639) BP: (127)/(75) 127/75 mmHg (04/19 0639) SpO2:  [97 %] 97 % (04/19 0639) Weight:  [99.338 kg (219 lb)] 99.338 kg (219 lb) (04/19 0639)  Labs:   Estimated body mass index is 33.77 kg/(m^2) as calculated from the following:   Height as of this encounter: 5' 7.5" (1.715 m).   Weight as of this encounter: 99.338 kg (219 lb).   Imaging Review Plain radiographs demonstrate severe degenerative joint disease of the right knee(s). The overall alignment ismild varus. The bone quality appears to be good for age and reported activity level.  Assessment/Plan:  End stage arthritis, right knee   The patient history,  physical examination, clinical judgment of the provider and imaging studies are consistent with end stage degenerative joint disease of the right knee(s) and uni vs total knee arthroplasty is deemed medically necessary. The treatment options including medical management, injection therapy arthroscopy and arthroplasty were discussed at length. The risks and benefits of total knee arthroplasty were presented and reviewed. The risks due to aseptic loosening, infection, stiffness, patella tracking problems, thromboembolic complications and other imponderables were discussed. The patient acknowledged the explanation, agreed to proceed with the plan and consent was signed. Patient is being admitted for inpatient treatment for surgery, pain control, PT, OT, prophylactic antibiotics, VTE prophylaxis, progressive ambulation and ADL's and discharge planning. The patient is planning to be discharged home with home health services

## 2015-01-24 NOTE — Anesthesia Postprocedure Evaluation (Signed)
Anesthesia Post Note  Patient: Andrew [name]  Procedure(s) Performed: Procedure(s) (LRB): RIGHT KNEE MEDIAL COMPARTMENT REPLACEMENT VS TOTAL KNEE ARTHROPLASTY. (Right)  Anesthesia type: MAC/SAB  Patient location: PACU  Post pain: Pain level controlled  Post assessment: Patient's Cardiovascular Status Stable  Last Vitals:  Filed Vitals:   01/24/15 1215  BP: 142/95  Pulse: 69  Temp:   Resp: 16    Post vital signs: Reviewed and stable  Level of consciousness: sedated  Complications: No apparent anesthesia complications

## 2015-01-24 NOTE — Progress Notes (Signed)
Utilization review completed.  

## 2015-01-24 NOTE — Anesthesia Procedure Notes (Addendum)
Procedure Name: MAC Date/Time: 01/24/2015 7:43 AM Performed by: Scheryl Darter Pre-anesthesia Checklist: Patient identified, Emergency Drugs available, Suction available, Patient being monitored and Timeout performed Patient Re-evaluated:Patient Re-evaluated prior to inductionOxygen Delivery Method: Simple face mask Intubation Type: IV induction Placement Confirmation: positive ETCO2   Spinal Patient location during procedure: OR Start time: 01/24/2015 7:41 AM End time: 01/24/2015 7:51 AM Staffing Anesthesiologist: Duane Boston Performed by: anesthesiologist  Preanesthetic Checklist Completed: patient identified, surgical consent, pre-op evaluation, timeout performed, IV checked, risks and benefits discussed and monitors and equipment checked Spinal Block Patient position: sitting Prep: Betadine Patient monitoring: cardiac monitor, continuous pulse ox and blood pressure Approach: midline Location: L2-3 Injection technique: single-shot Needle Needle type: Pencan  Needle gauge: 24 G Needle length: 9 cm Additional Notes Functioning IV was confirmed and monitors were applied. Sterile prep and drape, including hand hygiene and sterile gloves were used. The patient was positioned and the spine was prepped. The skin was anesthetized with lidocaine.  Free flow of clear CSF was obtained prior to injecting local anesthetic into the CSF.  The spinal needle aspirated freely following injection.  The needle was carefully withdrawn.  The patient tolerated the procedure well.

## 2015-01-24 NOTE — Progress Notes (Signed)
PT Cancellation Note  Patient Details Name: Andrew [name] MRN: [id number] DOB: 1966-06-16   Cancelled Treatment:    Reason Eval/Treat Not Completed: Patient's level of consciousness;Pain limiting ability to participate Spent 17 minutes attempting to perform PT evaluation - removed CPM as pt requested to sit EOB to use urinal. Very drowsy and not able to stay alert to participate in therapy. Pt quickly became very unpleasant and agitated when asked to sit EOB. "you are too rough." Will follow up next available time when pt is alert and cooperative to perform PT evaluation.   Candy Sledge A 01/24/2015, 2:57 PM Candy Sledge, [place], DPT [phone]

## 2015-01-25 ENCOUNTER — Encounter (HOSPITAL_COMMUNITY): Payer: Self-pay | Admitting: General Practice

## 2015-01-25 MED ORDER — OXYCODONE HCL ER 10 MG PO T12A
10.0000 mg | EXTENDED_RELEASE_TABLET | Freq: Two times a day (BID) | ORAL | Status: DC
  Administered 2015-01-25 – 2015-01-27 (×5): 10 mg via ORAL
  Filled 2015-01-25 (×5): qty 1

## 2015-01-25 NOTE — Evaluation (Signed)
Occupational Therapy Evaluation Patient Details Name: Andrew [name] MRN: [id number] DOB: 04/09/1966 Today's Date: 01/25/2015    History of Present Illness This 49 y.o. male admitted for Rt TKA.   PMH:  Bipolar; schizoaffective disorder; Tuberculosis; cocaine abuse; HTN; h/o dizziness.    Clinical Impression   Pt admitted with above. He demonstrates the below listed deficits and will benefit from continued OT to maximize safety and independence with BADLs.  Pt is highly anxious with moving.  Currently, he requires mod - max A for LB ADLs.  He lives in second floor apt and must be able to negotiate 2 flights of steps to enter.  He is unsure if he has assist at discharge.  Based on this, feel SNF may be best options at discharge to allow him to regain independence to return home modified independently.       Follow Up Recommendations  SNF    Equipment Recommendations  3 in 1 bedside comode;Tub/shower bench    Recommendations for Other Services       Precautions / Restrictions Precautions Precautions: Knee Precaution Booklet Issued: No Required Braces or Orthoses: Knee Immobilizer - Right Knee Immobilizer - Right: On at all times Restrictions Weight Bearing Restrictions: Yes RLE Weight Bearing: Weight bearing as tolerated      Mobility Bed Mobility               General bed mobility comments: Pt EOB with PT upon OT's entrance   Transfers Overall transfer level: Needs assistance   Transfers: Sit to/from [employer] Transfers Sit to Stand: Min assist;+2 safety/equipment Stand pivot transfers: Min assist;+2 safety/equipment       General transfer comment: Pt very anxious with mobility - fearful of falling. .  requires min A initially to steady,  Requires verbal cues for hand placement and walker safety     Balance Overall balance assessment: Needs assistance Sitting-balance support: Feet supported Sitting balance-Leahy Scale: Good     Standing balance  support: During functional activity;Single extremity supported Standing balance-Leahy Scale: Fair                              ADL Overall ADL's : Needs assistance/impaired Eating/Feeding: Independent;Sitting   Grooming: Wash/dry hands;Wash/dry face;Oral care;Set up;Sitting   Upper Body Bathing: Set up;Sitting   Lower Body Bathing: Moderate assistance;Sit to/from stand   Upper Body Dressing : Set up;Sitting   Lower Body Dressing: Sit to/from stand;Maximal assistance   Toilet Transfer: Minimal assistance;+2 for safety/equipment;Ambulation;Comfort height toilet;RW   Toileting- Clothing Manipulation and Hygiene: Maximal assistance;Sit to/from stand       Functional mobility during ADLs: Minimal assistance;+2 for safety/equipment;Rolling walker General ADL Comments: Pt very irritable.  requires cues and encouragement for participation.  very fearful of falling.  Currently, he is unable to access Rt LE for ADLs, and unable to remove one UE from walker to simulate pulling up pants      Vision     Perception     Praxis      Pertinent Vitals/Pain Pain Assessment: Faces Faces Pain Scale: Hurts worst Pain Location: RLE with movement Pain Descriptors / Indicators: Sore;Sharp;Throbbing;Guarding;Discomfort Pain Intervention(s): Limited activity within patient's tolerance;Monitored during session;Premedicated before session;Repositioned     Hand Dominance Right   Extremity/Trunk Assessment Upper Extremity Assessment Upper Extremity Assessment: Defer to OT evaluation   Lower Extremity Assessment Lower Extremity Assessment: RLE deficits/detail RLE Deficits / Details: Pt not allowing therapist to touch/mobilize RLE  due to pain. Lacking full knee extension ~15 degrees per visual observation. Pt will not attempt knee flexion secondary to pain. KI donned. Ankle AROM WFL. RLE: Unable to fully assess due to pain;Unable to fully assess due to immobilization   Cervical /  Trunk Assessment Cervical / Trunk Assessment: Normal   Communication Communication Communication: No difficulties   Cognition Arousal/Alertness: Awake/alert Behavior During Therapy: WFL for tasks assessed/performed Overall Cognitive Status: No family/caregiver present to determine baseline cognitive functioning                     General Comments       Exercises       Shoulder Instructions      Home Living Family/patient expects to be discharged to:: Private residence Living Arrangements: Alone Available Help at Discharge: Other (Comment) Type of Home: Apartment Home Access: Stairs to enter Entrance Stairs-Number of Steps: 2 flights  Entrance Stairs-Rails: Right Home Layout: One level     Bathroom Shower/Tub: Tub/shower unit;Curtain Shower/tub characteristics: Curtain       Home Equipment: None          Prior Functioning/Environment Level of Independence: Independent             OT Diagnosis: Generalized weakness;Acute pain   OT Problem List: Decreased strength;Decreased activity tolerance;Decreased safety awareness;Decreased knowledge of use of DME or AE;Decreased knowledge of precautions;Pain   OT Treatment/Interventions: Self-care/ADL training;DME and/or AE instruction;Therapeutic activities;Patient/family education;Balance training    OT Goals(Current goals can be found in the care plan section) Acute Rehab OT Goals Patient Stated Goal: to regain independence  OT Goal Formulation: With patient Time For Goal Achievement: 02/01/15 Potential to Achieve Goals: Good ADL Goals Pt Will Perform Grooming: with supervision;standing Pt Will Perform Lower Body Bathing: with supervision;with adaptive equipment;sit to/from stand Pt Will Perform Lower Body Dressing: with supervision;with adaptive equipment;sit to/from stand Pt Will Transfer to Toilet: with supervision;ambulating;regular height toilet;bedside commode;grab bars Pt Will Perform Toileting -  Clothing Manipulation and hygiene: with supervision;sit to/from stand Pt Will Perform Tub/Shower Transfer: Tub transfer;with supervision;ambulating;tub bench;rolling walker  OT Frequency: Min 2X/week   Barriers to D/C: Decreased caregiver support          Co-evaluation PT/OT/SLP Co-Evaluation/Treatment: Yes Reason for Co-Treatment: For patient/therapist safety PT goals addressed during session: Mobility/safety with mobility;Proper use of DME OT goals addressed during session: ADL's and self-care      End of Session Equipment Utilized During Treatment: Right knee immobilizer;Rolling walker;Gait belt CPM Right Knee CPM Right Knee: Off Nurse Communication: Mobility status  Activity Tolerance: Patient limited by pain Patient left: in chair;with call bell/phone within reach   Time: 7793-9030 OT Time Calculation (min): 25 min Charges:  OT General Charges $OT Visit: 1 Procedure OT Evaluation $Initial OT Evaluation Tier I: 1 Procedure G-Codes:    Lucille Passy M 02/01/15, 3:29 PM

## 2015-01-25 NOTE — Evaluation (Signed)
Physical Therapy Evaluation Patient Details Name: Andrew [name] MRN: [id number] DOB: [birth year] Today's Date: 01/25/2015   History of Present Illness  This 49 y.o. male admitted for Rt TKA.   PMH:  Bipolar; schizoaffective disorder; Tuberculosis; cocaine abuse; HTN; h/o dizziness.     Clinical Impression  Patient presents with increased pain, post surgical deficits of RLE s/p above surgery and anxiety impacting mobility. Increased time to perform all mobility and pt with low pain tolerance. Pt has 2 flights of steps to negotiate to get into second floor apt and not sure if he has assist at discharge. Because of this, pt would benefit from ST SNF to improve mobility and maximize independence so pt can safely return home at a Mod I level.    Follow Up Recommendations SNF;Supervision/Assistance - 24 hour    Equipment Recommendations  Rolling walker with 5" wheels    Recommendations for Other Services       Precautions / Restrictions Precautions Precautions: Knee Precaution Booklet Issued: No Required Braces or Orthoses: Knee Immobilizer - Right Knee Immobilizer - Right: On at all times Restrictions Weight Bearing Restrictions: Yes RLE Weight Bearing: Weight bearing as tolerated      Mobility  Bed Mobility Overal bed mobility: Needs Assistance Bed Mobility: Sidelying to Sit;Supine to Sit     Supine to sit: Min assist;HOB elevated     General bed mobility comments: Min A to bring RLE to EOB. Increased time and cues with pt using trapeze bar to scoot bottom to EOB. Very anxious and anticipatory about pain.  Transfers Overall transfer level: Needs assistance Equipment used: Rolling walker (2 wheeled) Transfers: Sit to/from Stand Sit to Stand: Min assist;+2 physical assistance Stand pivot transfers: Min assist;+2 physical assistance       General transfer comment: Pt very anxious with mobility - fearful of falling. .  requires min A initially to steady,  Requires verbal  cues for hand placement and walker safety.   Ambulation/Gait Ambulation/Gait assistance: Min assist Ambulation Distance (Feet): 25 Feet Assistive device: Rolling walker (2 wheeled) Gait Pattern/deviations: Step-to pattern;Decreased stride length;Decreased stance time - right;Decreased step length - left;Trunk flexed   Gait velocity interpretation: Below normal speed for age/gender General Gait Details: Requires cues to push RW as pt wanting to pick it up. Heavy reliance on RW and UEs esp when stepping through with RLE. Short, small steps secondary to pain. Very guarded and anxious.  Stairs            Wheelchair Mobility    Modified Rankin (Stroke Patients Only)       Balance Overall balance assessment: Needs assistance Sitting-balance support: Feet supported;No upper extremity supported Sitting balance-Leahy Scale: Good     Standing balance support: During functional activity;Single extremity supported Standing balance-Leahy Scale: Fair Standing balance comment: Able to stand and urinate with min guard assist for safety/balance.                             Pertinent Vitals/Pain Pain Assessment: Faces Faces Pain Scale: Hurts worst Pain Location: RLE with movement Pain Descriptors / Indicators: Sore;Sharp;Throbbing;Guarding;Discomfort Pain Intervention(s): Limited activity within patient's tolerance;Monitored during session;Premedicated before session;Repositioned    Home Living Family/patient expects to be discharged to:: Private residence Living Arrangements: Alone Available Help at Discharge: Other (Comment) Type of Home: Apartment Home Access: Stairs to enter Entrance Stairs-Rails: Right Entrance Stairs-Number of Steps: 2 flights  Home Layout: One level Home Equipment: None  Prior Function Level of Independence: Independent               Hand Dominance   Dominant Hand: Right    Extremity/Trunk Assessment   Upper Extremity  Assessment: Defer to OT evaluation           Lower Extremity Assessment: RLE deficits/detail RLE Deficits / Details: Pt not allowing therapist to touch/mobilize RLE due to pain. Lacking full knee extension ~15 degrees per visual observation. Pt will not attempt knee flexion secondary to pain. KI donned. Ankle AROM WFL.    Cervical / Trunk Assessment: Normal  Communication   Communication: No difficulties  Cognition Arousal/Alertness: Awake/alert Behavior During Therapy: WFL for tasks assessed/performed Overall Cognitive Status: No family/caregiver present to determine baseline cognitive functioning                      General Comments General comments (skin integrity, edema, etc.): Education provided on importance of sitting upright to help prevent complications post surgery, HEP and wearing CPM however pt refusing CPM and to perform exercises.     Exercises Total Joint Exercises Ankle Circles/Pumps: Right;10 reps;Supine Quad Sets:  (Refused)      Assessment/Plan    PT Assessment Patient needs continued PT services  PT Diagnosis Difficulty walking;Abnormality of gait;Acute pain   PT Problem List Decreased strength;Pain;Cardiopulmonary status limiting activity;Decreased range of motion;Decreased balance;Decreased mobility;Decreased activity tolerance;Decreased knowledge of use of DME  PT Treatment Interventions Balance training;Gait training;DME instruction;Stair training;Functional mobility training;Therapeutic activities;Therapeutic exercise;Patient/family education   PT Goals (Current goals can be found in the Care Plan section) Acute Rehab PT Goals Patient Stated Goal: to return to independence PT Goal Formulation: With patient Time For Goal Achievement: 02/08/15 Potential to Achieve Goals: Fair    Frequency 7X/week (BID)   Barriers to discharge Decreased caregiver support      Co-evaluation PT/OT/SLP Co-Evaluation/Treatment: Yes Reason for Co-Treatment:  For patient/therapist safety PT goals addressed during session: Mobility/safety with mobility;Proper use of DME OT goals addressed during session: ADL's and self-care       End of Session Equipment Utilized During Treatment: Gait belt;Right knee immobilizer Activity Tolerance: Patient limited by pain Patient left: in chair;with call bell/phone within reach Nurse Communication: Mobility status         Time: 5176-1607 PT Time Calculation (min) (ACUTE ONLY): 52 min   Charges:   PT Evaluation $Initial PT Evaluation Tier I: 1 Procedure PT Treatments $Gait Training: 8-22 mins   PT G CodesCandy Sledge A 02-13-15, 3:38 PM Candy Sledge, [place], [place]

## 2015-01-25 NOTE — Progress Notes (Signed)
Subjective: Pt having pain   Objective: Vital signs in last 24 hours: Temp:  [97.7 F (36.5 C)-99 F (37.2 C)] 99 F (37.2 C) (04/20 0615) Pulse Rate:  [57-90] 83 (04/20 0615) Resp:  [13-22] 18 (04/20 0615) BP: (107-143)/(62-100) 129/62 mmHg (04/20 0615) SpO2:  [93 %-99 %] 94 % (04/20 0615)  Intake/Output from previous day: 04/19 0701 - 04/20 0700 In: 4720 [P.O.:1520; I.V.:2840; IV Piggyback:360] Out: 1050 [Urine:500; Blood:550] Intake/Output this shift:    Exam:  Neurovascular intact Sensation intact distally Intact pulses distally  Labs: No results for input(s): HGB in the last 72 hours. No results for input(s): WBC, RBC, HCT, PLT in the last 72 hours. No results for input(s): NA, K, CL, CO2, BUN, CREATININE, GLUCOSE, CALCIUM in the last 72 hours. No results for input(s): LABPT, INR in the last 72 hours.  Assessment/Plan: Plan PT and CPM needs to work on extension pain control an issue - ok to go off floor in wheel chair   DEAN,GREGORY SCOTT 01/25/2015, 7:25 AM

## 2015-01-25 NOTE — Progress Notes (Signed)
Patient refused to use CPM machine. RN expressed importance of device and patient continued to refused. Attempted on two separate occassions.

## 2015-01-25 NOTE — Op Note (Signed)
NAMERANDE, ROYLANCE NO.:  [id number]  MEDICAL RECORD NO.:  [id number]  LOCATION:  6N19C                        FACILITY:  [facility]  PHYSICIAN:  Anderson Malta, M.D.    DATE OF BIRTH:  1965/11/16  DATE OF PROCEDURE: DATE OF DISCHARGE:                              OPERATIVE REPORT   PREOPERATIVE DIAGNOSIS:  Right knee medial compartment arthritis.  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  PROCEDURE:  Right total knee replacement.  SURGEON:  Anderson Malta, M.D.  ASSISTANT:  Laure Kidney, RNFA  INDICATIONS:  Andrew [name] is a 49 year old patient with right knee arthritis.  Preoperatively, the patient's MRI scan consistent with a bone-on-bone medial compartment arthritis at the time of surgery; however, full-thickness chondral defect with undermining of the cartilage found in the landing zone of the trochlea over the size of about a nickel.  There were corresponding full-thickness changes on the patella with spurring.  Decision at that time was made to proceed with total knee replacement.  Components utilized; Stryker press-fit 6 tibia, 6 femur, 11 poly, 32-mm patella.  PROCEDURE IN DETAIL:  The patient was brought to the operating room where spinal anesthetic was induced.  Preoperative antibiotics were administered.  Time-out was called.  Right leg was prescrubbed with alcohol and Betadine, allowed to air dry.  Prepped with DuraPrep solutio, draped in a sterile manner.  The leg was initially placed in a holder to perform medial compartment arthroplasty.  Leg was elevated and exsanguinated with Esmarch wrap.  Tourniquet was inflated at 300 mmHg. Incision was made from the medial aspect of the patella to the medial aspect of the tibial tubercle.  Skin and subcutaneous tissue were sharply divided.  Median parapatellar approach was made just adjacent to the patellar tendon.  Fat  pad was excised.  Medial compartment had significant wear.  Lateral compartment was pristine; however, the trochlea had full-thickness chondral defect, not noted on preop MRI scanning, also had corresponding changes on the patella.  Undermining of the cartilage was present.  I think short term, this would probably do well, but long term beyond 5 years, I think he had a significant chance of developing symptomatic patellofemoral arthritis.  For that reason, the decision was made to perform total knee replacement.  The elevated leg holder was removed.  The incision was extended proximally.  Median parapatellar approach was extended and marked with a #1 Vicryl suture. Patella was everted.  Lateral patellofemoral ligament was released. Soft tissue elevated off the anterior distal femur.  At this time, tibia was prepared and keel punched, and initially took a 2-mm resection off the medial tibial plateau.  Two more was resected because of the desire press-fit tibia.  This allowed almost all sclerotic bone to be removed from the medial side.  Cut was made perpendicular to the mechanical axis with posterior and collateral structures were well protected.  At this time, intramedullary alignment was used on the femur and an 8-mm cut was made with the collaterals protected.  The femur was sized to a size 6. Chamfers, anterior, posterior and box cuts were made.  Trial reduction was performed, both 9 and 11-mm poly spacer.  This gave about 1-2 degrees of hyperextension with the 11 and about 5 degrees of hyperextension with the 9.  At this time, patella was prepared from 24 to 14.  Patellar trial was placed.  Tibia was then keel punched and trial reduction was performed.  At this time, with a 9 and 11 spacer, the patient had excellent stability and full extension with an 11 spacer, and instability of varus and valgus stress at 0, 30 and 90 degrees.  The press-fit procedure was completed on the tibia in  terms of preparation.  Trial components were removed.  Thorough irrigation was performed, 3 liters of irrigating solution.  Exparel injected into and around the knee joint.  The components were then press-fit into position with same stability parameters maintained.  The patient had full extension, full flexion, excellent patellar tracking with no thumbs technique, and excellent stability of varus and valgus stress at 0, 30 and 90 degrees, had about 2 degrees of hyperextension.  Tourniquet released at this time.  Incision was closed after controlling the bleeding points with electrocautery using #1 Vicryl sutures, interrupted 0 Vicryl suture, 2-0 Vicryl suture, and 3-0 Monocryl.  Solution of Marcaine, morphine, clonidine injected into the knee.  Bulky dressing placed.  Knee immobilizer placed.  The patient was transferred to the recovery room in stable condition.     Anderson Malta, M.D.     GSD/MEDQ  D:  01/24/2015  T:  01/25/2015  Job:  [id number]

## 2015-01-26 MED ORDER — ALPRAZOLAM 0.5 MG PO TABS
0.5000 mg | ORAL_TABLET | Freq: Three times a day (TID) | ORAL | Status: DC
  Administered 2015-01-26 – 2015-01-27 (×5): 0.5 mg via ORAL
  Filled 2015-01-26 (×5): qty 1

## 2015-01-26 MED ORDER — DIPHENHYDRAMINE HCL 25 MG PO CAPS
25.0000 mg | ORAL_CAPSULE | Freq: Four times a day (QID) | ORAL | Status: DC | PRN
  Administered 2015-01-26: 25 mg via ORAL
  Filled 2015-01-26: qty 1

## 2015-01-26 NOTE — Clinical Social Work Placement (Addendum)
   CLINICAL SOCIAL WORK PLACEMENT  NOTE  Date:  01/26/2015  Patient Details  Name: Andrew [name] MRN: [id number] Date of Birth: [birth year]  Clinical Social Work is seeking post-discharge placement for this patient at the [place] level of care (*CSW will initial, date and re-position this form in  chart as items are completed):  Yes   Patient/family provided with [facility] Work Department's list of facilities offering this level of care within the geographic area requested by the patient (or if unable, by the patient's family).      Patient/family informed of their freedom to choose among providers that offer the needed level of care, that participate in Medicare, Medicaid or managed care program needed by the patient, have an available bed and are willing to accept the patient.  Yes   Patient/family informed of [facility]'s ownership interest in [facility] and [facility], as well as of the fact that they are under no obligation to receive care at these facilities.  PASRR submitted to EDS on 01/26/15     PASRR number received on  01/27/15     Existing PASRR number confirmed on       FL2 transmitted to all facilities in geographic area requested by pt/family on 01/26/15     FL2 transmitted to all facilities within larger geographic area on 01/26/15     Patient informed that his/her managed care company has contracts with or will negotiate with certain facilities, including the following:        Yes   Patient/family informed of bed offers received.  Patient chooses bed at  [facility] [place] Evette Cristal, MSW, LCSWA Updated 01/27/15)    Physician recommends and patient chooses bed at      Patient to be transferred to  [facility] on  01/27/15. Evette Cristal, MSW, LCSWA Updated 01/27/15)  Patient to be transferred to facility by  [facility] Evette Cristal, MSW, LCSWA Updated 01/27/15)     Patient family notified on    of transfer. Patient does not have any family to notify  Name of family member notified:   Patient will notify his friends that he is discharging.     PHYSICIAN Please sign FL2 (Please sign 30 day [facility] note due to his mental health diagnoses, for state to approve SNF placement)     Additional Comment:    _______________________________________________ Jones Broom. [place], MSW, [place] 01/27/2015 2:46 PM

## 2015-01-26 NOTE — Progress Notes (Signed)
OT Cancellation Note  Patient Details Name: Andrew [name] MRN: [id number] DOB: 1966/03/23   Cancelled Treatment:    Reason Eval/Treat Not Completed: Fatigue/lethargy limiting ability to participate.  Pt adamantly refusing OT stating he doesn't feel well, and has been up once already today.   Long discussion with pt re: need to increase activity and risks associated with staying in bed, but he continues to refuse - he doesn't seem to comprehend the implications.   He will definitively need SNF level rehab at discharge.   Darlina Rumpf [place], OTR/L [phone]  01/26/2015, 12:56 PM

## 2015-01-26 NOTE — Clinical Social Work Note (Signed)
Clinical Social Work Assessment  Patient Details  Name: Andrew [name] MRN: [id number] Date of Birth: January 08, 1966  Date of referral:  01/26/15               Reason for consult:  Facility Placement                Permission sought to share information with:  Case Manager, PCP, Family Supports, Customer service manager Permission granted to share information::  Yes, Verbal Permission Granted  Name::        Agency::     Relationship::     Contact Information:     Housing/Transportation Living arrangements for the past 2 months:  Apartment Source of Information:  Patient Patient Interpreter Needed:  None Criminal Activity/Legal Involvement Pertinent to Current Situation/Hospitalization:  No - Comment as needed Significant Relationships:  None Lives with:  Self Do you feel safe going back to the place where you live?  Yes Need for family participation in patient care:  No (Coment)  Care giving concerns:  Patient does not have much support   Facilities manager / plan:  Patient is a 49 year old male who lives alone and does not have much support from friends or family.  Patient is alert and oriented x4 and not very talkative.  Patient expressed that he just wants to get his strength back up so he can return home and patient expressed that he is expecting to be at a SNF for rehab for very short period of time.  Patient stated he originally wanted to go home, but realized he will need more intense physical therapy which he can get at a SNF.  Patient expressed he does not care if it is [facility] or [facility] he just wants to go some where to get his therapy.  Patient will discharge to SNF once he is medically ready and discharge orders have been received.  Employment status:  Disabled (Comment on whether or not currently receiving Disability)  Patient is receiving disability Insurance information:  Medicare, Medicaid In [place] PT Recommendations:  [place], 24 Hour  Supervision Information / Referral to community resources:  [place]  Patient/Family's Response to care: Patient in agreement to going to SNF for short term rehab.  Patient/Family's Understanding of and Emotional Response to Diagnosis, Current Treatment, and Prognosis:  Patient states he is in a lot of pain and realizes he needs to participate in physical therapy to get his strength back and recover.  Patient expressed understanding about the difference of home health PT and SNF PT.  Emotional Assessment Appearance:  Appears stated age, Well-Groomed Attitude/Demeanor/Rapport:    Affect (typically observed):  Accepting, Appropriate, Quiet, Stable Orientation:  Oriented to Self, Oriented to Place, Oriented to  Time, Oriented to Situation Alcohol / Substance use:  Alcohol Use, Other, Illicit Drugs (Used cocaine in the past but has not recently) Psych involvement (Current and /or in the community):  No (Comment)  Discharge Needs  Concerns to be addressed:  Lack of Support, Medication Concerns Readmission within the last 30 days:  No Current discharge risk:  Psychiatric Illness, Lack of support system Barriers to Discharge:  NO barriers to discharge   Andrew [name] R. [place], MSW, [place] 01/26/2015 5:17 PM

## 2015-01-26 NOTE — Clinical Social Work Note (Signed)
CSW met with patient to discuss SNF placement options.  Patient is agreeable to going to SNF for short term rehab.  Patient has been faxed out and has bed offers.  FL2 and Level 2 passar 30 day nursing home stay note on chart to be signed by physician.  Patient to be discharged to SNF once he is medically ready, Level 2 [facility] number has been received, and discharge orders have been given.  Andrew [name]. [place], MSW, [place] 01/26/2015 5:27 PM

## 2015-01-26 NOTE — Progress Notes (Signed)
Subjective: Pt stable - reports pain in knee but looks better than yesterday   Objective: Vital signs in last 24 hours: Temp:  [98.6 F (37 C)-100.5 F (38.1 C)] 99.2 F (37.3 C) (04/21 6063) Pulse Rate:  [85-95] 91 (04/21 0633) Resp:  [16-18] 18 (04/21 0633) BP: (118-142)/(73-86) 118/78 mmHg (04/21 0633) SpO2:  [94 %-96 %] 94 % (04/21 0160)  Intake/Output from previous day: 04/20 0701 - 04/21 0700 In: 1320 [P.O.:1320] Out: 2225 [Urine:2225] Intake/Output this shift:    Exam:  Neurovascular intact Sensation intact distally Intact pulses distally  Labs: No results for input(s): HGB in the last 72 hours. No results for input(s): WBC, RBC, HCT, PLT in the last 72 hours. No results for input(s): NA, K, CL, CO2, BUN, CREATININE, GLUCOSE, CALCIUM in the last 72 hours. No results for input(s): LABPT, INR in the last 72 hours.  Assessment/Plan: Plan PT and CPM and leg extension exercises today- -need for all of these extensively discussed with the pt - he may well benefit from more intensive rehab at [facility] - will add xanax for anxiety   Rhodes Calvert SCOTT 01/26/2015, 8:28 AM

## 2015-01-26 NOTE — Progress Notes (Signed)
Physical Therapy Treatment Patient Details Name: Andrew [name] MRN: [id number] DOB: 03/08/1966 Today's Date: 01/26/2015    History of Present Illness This 49 y.o. male admitted for Rt TKA.   PMH:  Bipolar; schizoaffective disorder; Tuberculosis; cocaine abuse; HTN; h/o dizziness.     PT Comments    Patient improved ambulation distance today with max encouragement and coaxing to participate. Continues to refuse there ex even after explaining importance of needing improved knee AROM/strength for mobility. Pain seemed more tolerable today. Reviewed precautions of not having pillow under knee. Session takes increased time secondary to increased pain and pt needing to be in control. Will attempt to see in PM if pt is willing to participate. Continue to recommend ST SNF.   Follow Up Recommendations  SNF;Supervision/Assistance - 24 hour     Equipment Recommendations  Rolling walker with 5" wheels    Recommendations for Other Services       Precautions / Restrictions Precautions Precautions: Knee Precaution Booklet Issued: No Precaution Comments: Pillow under pt's right knee upon PT entering room. Removed it and reiterated importance of no pillows under knee after surgery. Required Braces or Orthoses: Knee Immobilizer - Right Knee Immobilizer - Right: On at all times (except in CPM or with PT.) Restrictions Weight Bearing Restrictions: Yes RLE Weight Bearing: Weight bearing as tolerated    Mobility  Bed Mobility Overal bed mobility: Needs Assistance Bed Mobility: Supine to Sit;Sit to Supine     Supine to sit: Min assist;HOB elevated Sit to supine: Min assist;HOB elevated   General bed mobility comments: Min A to bring RLE into/out of bed with pt guiding LE. Increased time and cues with pt using trapeze bar to scoot bottom to EOB.  Transfers Overall transfer level: Needs assistance Equipment used: Rolling walker (2 wheeled) Transfers: Sit to/from Stand Sit to Stand: Min  guard         General transfer comment: Min guard to stand from EOB with cues for hand placement and walker safety. Anxious with mobility.  Ambulation/Gait Ambulation/Gait assistance: Min guard Ambulation Distance (Feet): 60 Feet Assistive device: Rolling walker (2 wheeled) Gait Pattern/deviations: Step-to pattern;Decreased stride length;Decreased stance time - right;Decreased step length - left;Trunk flexed   Gait velocity interpretation: Below normal speed for age/gender General Gait Details: Requires cues for RW management as pt picks up RW instead of rolling it. Heavy reliance on UEs. Very guarded and anxious- fearful of falling, "you got me!?" Increased knee flexion RLE throughout gait cycle.   Stairs            Wheelchair Mobility    Modified Rankin (Stroke Patients Only)       Balance Overall balance assessment: Needs assistance Sitting-balance support: Feet supported;No upper extremity supported Sitting balance-Leahy Scale: Good     Standing balance support: During functional activity Standing balance-Leahy Scale: Fair                      Cognition Arousal/Alertness: Awake/alert Behavior During Therapy: WFL for tasks assessed/performed Overall Cognitive Status: No family/caregiver present to determine baseline cognitive functioning                      Exercises      General Comments General comments (skin integrity, edema, etc.): Discussed importance of positioning of RLE to help with knee AROM. Pt refusing CPM and KI for activity. Refuses to perform any exercises after reasoning and explanation. Continues to lack knee extension by ~15 degrees per  visual observation.       Pertinent Vitals/Pain Pain Assessment: Faces Faces Pain Scale: Hurts worst Pain Location: RLE with movement. Pain Descriptors / Indicators: Sharp;Burning;Aching Pain Intervention(s): Monitored during session;Premedicated before session;Repositioned;Limited  activity within patient's tolerance    Home Living                      Prior Function            PT Goals (current goals can now be found in the care plan section) Progress towards PT goals: Progressing toward goals    Frequency  7X/week    PT Plan Current plan remains appropriate    Co-evaluation             End of Session Equipment Utilized During Treatment: Gait belt;Right knee immobilizer Activity Tolerance: Patient limited by pain;Other (comment) (Refusing to participate in exercises.) Patient left: in chair     Time: 6701-4103 PT Time Calculation (min) (ACUTE ONLY): 36 min  Charges:  $Gait Training: 8-22 mins $Therapeutic Exercise: 8-22 mins                    G CodesCandy Sledge A 02/11/2015, 1:07 PM  Candy Sledge, [place], DPT [phone]

## 2015-01-26 NOTE — Progress Notes (Signed)
Physical Therapy Treatment Patient Details Name: Andrew [name] MRN: [id number] DOB: [birth year] Today's Date: 01/26/2015    History of Present Illness This 49 y.o. male admitted for Rt TKA.   PMH:  Bipolar; schizoaffective disorder; Tuberculosis; cocaine abuse; HTN; h/o dizziness.     PT Comments    Patient progressing slowly with mobility. Continues to require max coaxing and encouragement to perform there ex/AROM. Unwilling to attempt quad sets and knee flexion AROM secondary to pain and "I don't want too." Encouraged wearing CPM however pt continued to refuse. Does not seem to understand the implications not performing AROM/exercise, mobility or wearing CPM even with in depth explanation and reasoning. Will continue to follow and progress as pt allows/tolerates.   Follow Up Recommendations  SNF;Supervision/Assistance - 24 hour     Equipment Recommendations  Rolling walker with 5" wheels    Recommendations for Other Services       Precautions / Restrictions Precautions Precautions: Knee Precaution Booklet Issued: No Precaution Comments: Pillow under pt's right knee upon PT entering room. Removed it and reiterated importance of no pillows under knee after surgery. Required Braces or Orthoses: Knee Immobilizer - Right Knee Immobilizer - Right: On at all times Restrictions Weight Bearing Restrictions: Yes RLE Weight Bearing: Weight bearing as tolerated    Mobility  Bed Mobility Overal bed mobility: Needs Assistance Bed Mobility: Supine to Sit     Supine to sit: Supervision Sit to supine: Min assist;HOB elevated   General bed mobility comments: Supervision to get to EOB. Use of trapeze bar for assist. Cues for technique,  Transfers Overall transfer level: Needs assistance Equipment used: Rolling walker (2 wheeled) Transfers: Sit to/from Stand Sit to Stand: Min guard         General transfer comment: Min guard to stand from EOB with cues for hand placement and  walker safety. Transferred to chair.  Ambulation/Gait Ambulation/Gait assistance: Min guard Ambulation Distance (Feet): 16 Feet Assistive device: Rolling walker (2 wheeled) Gait Pattern/deviations: Step-to pattern;Decreased stride length;Decreased stance time - right;Decreased step length - left;Trunk flexed   Gait velocity interpretation: Below normal speed for age/gender General Gait Details: Decreased stance time RLE due to pain. Increased knee flexion throughout gait cycle. Cues for knee extension during stance phase and for step throught gait.    Stairs            Wheelchair Mobility    Modified Rankin (Stroke Patients Only)       Balance Overall balance assessment: Needs assistance Sitting-balance support: Feet supported;No upper extremity supported Sitting balance-Leahy Scale: Good     Standing balance support: During functional activity Standing balance-Leahy Scale: Fair                      Cognition Arousal/Alertness: Awake/alert Behavior During Therapy: WFL for tasks assessed/performed Overall Cognitive Status: No family/caregiver present to determine baseline cognitive functioning                      Exercises Total Joint Exercises Ankle Circles/Pumps: Right;10 reps;Supine Quad Sets:  ("I will not." Performed x1 with max coaxing. ) Knee Flexion: AROM;Right (x1 with max coaxing - unwilling)    General Comments General comments (skin integrity, edema, etc.): Total A to donn KI in bed prior to mobility. Agreeable to wear it for this session. Education provided on there ex- QS, knee presses and knee flexion AROM sitting in chair.       Pertinent Vitals/Pain Pain Assessment: Faces  Faces Pain Scale: Hurts little more Pain Location: right knee with movement. Pain Descriptors / Indicators: Sore;Aching Pain Intervention(s): Monitored during session;Repositioned    Home Living                      Prior Function             PT Goals (current goals can now be found in the care plan section) Progress towards PT goals: Progressing toward goals    Frequency  7X/week    PT Plan Current plan remains appropriate    Co-evaluation             End of Session Equipment Utilized During Treatment: Gait belt;Right knee immobilizer Activity Tolerance: Patient limited by pain;Other (comment) Patient left: in chair;with call bell/phone within reach     Time: 1505-1530 PT Time Calculation (min) (ACUTE ONLY): 25 min  Charges:  $Gait Training: 8-22 mins $Therapeutic Exercise: 8-22 mins $Therapeutic Activity: 8-22 mins                    G CodesCandy Sledge A Feb 05, 2015, 3:43 PM  Candy Sledge, [place], DPT [phone]

## 2015-01-27 MED ORDER — OXYCODONE HCL ER 10 MG PO T12A: 10.0000 mg | EXTENDED_RELEASE_TABLET | Freq: Two times a day (BID) | ORAL | Status: DC

## 2015-01-27 MED ORDER — OXYCODONE HCL 5 MG PO TABS: 5.0000 mg | ORAL_TABLET | ORAL | Status: DC | PRN

## 2015-01-27 MED ORDER — METHOCARBAMOL 500 MG PO TABS: 500.0000 mg | ORAL_TABLET | Freq: Four times a day (QID) | ORAL | Status: DC

## 2015-01-27 MED ORDER — RIVAROXABAN 10 MG PO TABS: 10.0000 mg | ORAL_TABLET | Freq: Every day | ORAL | Status: DC

## 2015-01-27 MED ORDER — ALPRAZOLAM 0.5 MG PO TABS: 0.5000 mg | ORAL_TABLET | Freq: Two times a day (BID) | ORAL | Status: DC | PRN

## 2015-01-27 NOTE — Progress Notes (Signed)
Discharge to [facility], transported  By [employer]. Patient was alert and oriented, not in any distress prior to discharge.

## 2015-01-27 NOTE — Progress Notes (Signed)
Subjective: Pt stable - pain better   Objective: Vital signs in last 24 hours: Temp:  [98.5 F (36.9 C)-100.6 F (38.1 C)] 98.5 F (36.9 C) (04/22 0543) Pulse Rate:  [88-98] 88 (04/22 0543) Resp:  [16-18] 16 (04/22 0543) BP: (116-135)/(77-80) 132/78 mmHg (04/22 0543) SpO2:  [95 %-99 %] 98 % (04/22 0543)  Intake/Output from previous day: 04/21 0701 - 04/22 0700 In: 600 [P.O.:600] Out: 2450 [Urine:2450] Intake/Output this shift:    Exam:  Sensation intact distally Intact pulses distally Dorsiflexion/Plantar flexion intact No cellulitis present  Labs: No results for input(s): HGB in the last 72 hours. No results for input(s): WBC, RBC, HCT, PLT in the last 72 hours. No results for input(s): NA, K, CL, CO2, BUN, CREATININE, GLUCOSE, CALCIUM in the last 72 hours. No results for input(s): LABPT, INR in the last 72 hours.  Assessment/Plan: Plan dc to snf today - needs rehab   Andrew [name] SCOTT 01/27/2015, 8:06 AM

## 2015-01-27 NOTE — Progress Notes (Signed)
PT Cancellation Note  Patient Details Name: Andrew [name] MRN: [id number] DOB: May 18, 1966   Cancelled Treatment:    Reason Eval/Treat Not Completed: Other (comment) Not able to perform PT tx as pt awaiting ambulance transport to be discharged to SNF.    Candy Sledge A 01/27/2015, 3:59 PM Candy Sledge, [place], DPT [phone]

## 2015-01-27 NOTE — Clinical Social Work Note (Addendum)
CSW spoke with patient who said he would like to go to [facility] on [address] in [place].  CSW contacted SNF who gave a bed offer pending nursing evaluation.  CSW asked for SNF to call back once he has been approved to go to facility.  CSW is waiting for physician to sign 30 day short term nursing home stay note.  Form has been faxed to physician awaiting return so it can be faxed to [facility] for approval.  12:45pm CSW received required paperwork from physician, [facility] information has been faxed awaiting [facility] determination.  2:15pm CSW received [facility] number, and contacted [facility] to confirm patient can be discharged today.  CSW spoke to patient who is aware of discharge and transfer, [facility] scheduled to transport patient. Patient to be d/c'ed today to [facility].  Patient agreeable to plans will transport via ems RN to call report.  Jones Broom. [place], MSW, [place] 01/27/2015 2:43 PM

## 2015-01-27 NOTE — Discharge Summary (Signed)
Physician Discharge Summary  Patient ID: Andrew [name] MRN: [id number] DOB/AGE: 1966/09/03 49 y.o.  Admit date: 01/24/2015 Discharge date: 01/27/2015  Admission Diagnoses:  Active Problems:   Arthritis of knee, degenerative   Discharge Diagnoses:  Same  Surgeries: Procedure(s): RIGHT KNEE  TOTAL KNEE ARTHROPLASTY. on 01/24/2015   Consultants:    Discharged Condition: Stable  Hospital Course: WALDEMAR SIEGEL is an 49 y.o. male who was admitted 01/24/2015 with a chief complaint of right knee pain, and found to have a diagnosis of right knee arthritis.  They were brought to the operating room on 01/24/2015 and underwent the above named procedures.  Was slow with rehab - needs snf for intense PT  Antibiotics given:  Anti-infectives    Start     Dose/Rate Route Frequency Ordered Stop   01/24/15 2000  vancomycin (VANCOCIN) IVPB 1000 mg/200 mL premix     1,000 mg 200 mL/hr over 60 Minutes Intravenous Every 12 hours 01/24/15 1347 01/24/15 2043   01/24/15 0800  vancomycin (VANCOCIN) IVPB 1000 mg/200 mL premix  Status:  Discontinued     1,000 mg 200 mL/hr over 60 Minutes Intravenous To Surgery 01/24/15 0746 01/24/15 1331   01/24/15 0600  clindamycin (CLEOCIN) IVPB 900 mg  Status:  Discontinued     900 mg 100 mL/hr over 30 Minutes Intravenous On call to O.R. 01/23/15 1316 01/24/15 1331    .  Recent vital signs:  Filed Vitals:   01/27/15 0543  BP: 132/78  Pulse: 88  Temp: 98.5 F (36.9 C)  Resp: 16    Recent laboratory studies:  Results for orders placed or performed during the hospital encounter of 01/17/15  Surgical pcr screen  Result Value Ref Range   MRSA, PCR POSITIVE (A) NEGATIVE   Staphylococcus aureus POSITIVE (A) NEGATIVE  Urine culture  Result Value Ref Range   Specimen Description URINE, RANDOM    Special Requests NONE    Colony Count NO GROWTH Performed at [facility]     Culture NO GROWTH Performed at [facility]     Report Status  01/19/2015 FINAL   CBC  Result Value Ref Range   WBC 6.0 4.0 - 10.5 K/uL   RBC 5.10 4.22 - 5.81 MIL/uL   Hemoglobin 16.4 13.0 - 17.0 g/dL   HCT 46.4 39.0 - 52.0 %   MCV 91.0 78.0 - 100.0 fL   MCH 32.2 26.0 - 34.0 pg   MCHC 35.3 30.0 - 36.0 g/dL   RDW 13.0 11.5 - 15.5 %   Platelets 292 150 - 400 K/uL  Urinalysis, Routine w reflex microscopic  Result Value Ref Range   Color, Urine AMBER (A) YELLOW   APPearance CLEAR CLEAR   Specific Gravity, Urine 1.025 1.005 - 1.030   pH 7.0 5.0 - 8.0   Glucose, UA NEGATIVE NEGATIVE mg/dL   Hgb urine dipstick NEGATIVE NEGATIVE   Bilirubin Urine NEGATIVE NEGATIVE   Ketones, ur NEGATIVE NEGATIVE mg/dL   Protein, ur NEGATIVE NEGATIVE mg/dL   Urobilinogen, UA 0.2 0.0 - 1.0 mg/dL   Nitrite NEGATIVE NEGATIVE   Leukocytes, UA NEGATIVE NEGATIVE  PTT  Result Value Ref Range   aPTT 30 24 - 37 seconds  Protime-INR  Result Value Ref Range   Prothrombin Time 12.7 11.6 - 15.2 seconds   INR 0.94 0.00 - 1.49  Comprehensive metabolic panel  Result Value Ref Range   Sodium 137 135 - 145 mmol/L   Potassium 4.1 3.5 - 5.1 mmol/L  Chloride 105 96 - 112 mmol/L   CO2 24 19 - 32 mmol/L   Glucose, Bld 75 70 - 99 mg/dL   BUN 11 6 - 23 mg/dL   Creatinine, Ser 0.80 0.50 - 1.35 mg/dL   Calcium 9.3 8.4 - 10.5 mg/dL   Total Protein 6.6 6.0 - 8.3 g/dL   Albumin 3.7 3.5 - 5.2 g/dL   AST 25 0 - 37 U/L   ALT 19 0 - 53 U/L   Alkaline Phosphatase 82 39 - 117 U/L   Total Bilirubin 0.6 0.3 - 1.2 mg/dL   GFR calc non Af Amer >90 >90 mL/min   GFR calc Af Amer >90 >90 mL/min   Anion gap 8 5 - 15  Type and screen  Result Value Ref Range   ABO/RH(D) O POS    Antibody Screen NEG    Sample Expiration 01/31/2015   ABO/Rh  Result Value Ref Range   ABO/RH(D) O POS     Discharge Medications:     Medication List    STOP taking these medications        ciprofloxacin 500 MG tablet  Commonly known as:  CIPRO     HYDROcodone-acetaminophen 5-325 MG per tablet   Commonly known as:  NORCO/VICODIN     metroNIDAZOLE 500 MG tablet  Commonly known as:  FLAGYL     naproxen 500 MG tablet  Commonly known as:  NAPROSYN     oxyCODONE-acetaminophen 10-325 MG per tablet  Commonly known as:  PERCOCET     predniSONE 50 MG tablet  Commonly known as:  DELTASONE      TAKE these medications        albuterol 108 (90 BASE) MCG/ACT inhaler  Commonly known as:  PROVENTIL HFA;VENTOLIN HFA  Inhale 2 puffs into the lungs every 4 (four) hours as needed for wheezing or shortness of breath.     ALPRAZolam 0.5 MG tablet  Commonly known as:  XANAX  Take 1 tablet (0.5 mg total) by mouth 2 (two) times daily as needed for anxiety.     hydrochlorothiazide 12.5 MG capsule  Commonly known as:  MICROZIDE  Take 1 capsule (12.5 mg total) by mouth daily.     methocarbamol 500 MG tablet  Commonly known as:  ROBAXIN  Take 1 tablet (500 mg total) by mouth 4 (four) times daily.     oxyCODONE 5 MG immediate release tablet  Commonly known as:  Oxy IR/ROXICODONE  Take 1-2 tablets (5-10 mg total) by mouth every 3 (three) hours as needed for breakthrough pain.     OxyCODONE 10 mg T12a 12 hr tablet  Commonly known as:  OXYCONTIN  Take 1 tablet (10 mg total) by mouth every 12 (twelve) hours.     rivaroxaban 10 MG Tabs tablet  Commonly known as:  XARELTO  Take 1 tablet (10 mg total) by mouth daily with breakfast.        Diagnostic Studies: No results found.  Disposition: 01-Home or Self Care      Discharge Instructions    Weight bearing as tolerated    Complete by:  As directed               Signed: DEAN,GREGORY SCOTT 01/27/2015, 8:10 AM

## 2015-01-27 NOTE — Discharge Instructions (Signed)
Information on my medicine - XARELTO® (Rivaroxaban) ° °This medication education was reviewed with me or my healthcare representative as part of my discharge preparation.  The pharmacist that spoke with me during my hospital stay was:  Chyler Creely G, RPH ° °Why was Xarelto® prescribed for you? °Xarelto® was prescribed for you to reduce the risk of blood clots forming after orthopedic surgery. The medical term for these abnormal blood clots is venous thromboembolism (VTE). ° °What do you need to know about xarelto® ? °Take your Xarelto® ONCE DAILY at the same time every day. °You may take it either with or without food. ° °If you have difficulty swallowing the tablet whole, you may crush it and mix in applesauce just prior to taking your dose. ° °Take Xarelto® exactly as prescribed by your doctor and DO NOT stop taking Xarelto® without talking to the doctor who prescribed the medication.  Stopping without other VTE prevention medication to take the place of Xarelto® may increase your risk of developing a clot. ° °After discharge, you should have regular check-up appointments with your healthcare provider that is prescribing your Xarelto®.   ° °What do you do if you miss a dose? °If you miss a dose, take it as soon as you remember on the same day then continue your regularly scheduled once daily regimen the next day. Do not take two doses of Xarelto® on the same day.  ° °Important Safety Information °A possible side effect of Xarelto® is bleeding. You should call your healthcare provider right away if you experience any of the following: °? Bleeding from an injury or your nose that does not stop. °? Unusual colored urine (red or dark brown) or unusual colored stools (red or black). °? Unusual bruising for unknown reasons. °? A serious fall or if you hit your head (even if there is no bleeding). ° °Some medicines may interact with Xarelto® and might increase your risk of bleeding while on Xarelto®. To help  avoid this, consult your healthcare provider or pharmacist prior to using any new prescription or non-prescription medications, including herbals, vitamins, non-steroidal anti-inflammatory drugs (NSAIDs) and supplements. ° °This website has more information on Xarelto®: www.xarelto.com. ° ° ° °

## 2015-01-27 NOTE — Progress Notes (Signed)
Report given to Nira Conn, Therapist, sports at [employer].

## 2015-02-01 DIAGNOSIS — F319 Bipolar disorder, unspecified: Secondary | ICD-10-CM | POA: Diagnosis not present

## 2015-02-01 DIAGNOSIS — F17210 Nicotine dependence, cigarettes, uncomplicated: Secondary | ICD-10-CM | POA: Diagnosis not present

## 2015-02-01 DIAGNOSIS — J449 Chronic obstructive pulmonary disease, unspecified: Secondary | ICD-10-CM | POA: Diagnosis not present

## 2015-02-01 DIAGNOSIS — Z471 Aftercare following joint replacement surgery: Secondary | ICD-10-CM | POA: Diagnosis not present

## 2015-02-01 DIAGNOSIS — Z96651 Presence of right artificial knee joint: Secondary | ICD-10-CM | POA: Diagnosis not present

## 2015-02-01 DIAGNOSIS — I10 Essential (primary) hypertension: Secondary | ICD-10-CM | POA: Diagnosis not present

## 2015-02-02 DIAGNOSIS — I10 Essential (primary) hypertension: Secondary | ICD-10-CM | POA: Diagnosis not present

## 2015-02-02 DIAGNOSIS — J449 Chronic obstructive pulmonary disease, unspecified: Secondary | ICD-10-CM | POA: Diagnosis not present

## 2015-02-02 DIAGNOSIS — Z471 Aftercare following joint replacement surgery: Secondary | ICD-10-CM | POA: Diagnosis not present

## 2015-02-02 DIAGNOSIS — F17210 Nicotine dependence, cigarettes, uncomplicated: Secondary | ICD-10-CM | POA: Diagnosis not present

## 2015-02-02 DIAGNOSIS — Z96651 Presence of right artificial knee joint: Secondary | ICD-10-CM | POA: Diagnosis not present

## 2015-02-02 DIAGNOSIS — F319 Bipolar disorder, unspecified: Secondary | ICD-10-CM | POA: Diagnosis not present

## 2015-02-03 DIAGNOSIS — Z96651 Presence of right artificial knee joint: Secondary | ICD-10-CM | POA: Diagnosis not present

## 2015-02-03 DIAGNOSIS — Z471 Aftercare following joint replacement surgery: Secondary | ICD-10-CM | POA: Diagnosis not present

## 2015-02-03 DIAGNOSIS — F319 Bipolar disorder, unspecified: Secondary | ICD-10-CM | POA: Diagnosis not present

## 2015-02-03 DIAGNOSIS — J449 Chronic obstructive pulmonary disease, unspecified: Secondary | ICD-10-CM | POA: Diagnosis not present

## 2015-02-03 DIAGNOSIS — F17210 Nicotine dependence, cigarettes, uncomplicated: Secondary | ICD-10-CM | POA: Diagnosis not present

## 2015-02-03 DIAGNOSIS — I10 Essential (primary) hypertension: Secondary | ICD-10-CM | POA: Diagnosis not present

## 2015-02-06 DIAGNOSIS — Z471 Aftercare following joint replacement surgery: Secondary | ICD-10-CM | POA: Diagnosis not present

## 2015-02-06 DIAGNOSIS — Z96651 Presence of right artificial knee joint: Secondary | ICD-10-CM | POA: Diagnosis not present

## 2015-02-06 DIAGNOSIS — F319 Bipolar disorder, unspecified: Secondary | ICD-10-CM | POA: Diagnosis not present

## 2015-02-06 DIAGNOSIS — I10 Essential (primary) hypertension: Secondary | ICD-10-CM | POA: Diagnosis not present

## 2015-02-06 DIAGNOSIS — F17210 Nicotine dependence, cigarettes, uncomplicated: Secondary | ICD-10-CM | POA: Diagnosis not present

## 2015-02-06 DIAGNOSIS — J449 Chronic obstructive pulmonary disease, unspecified: Secondary | ICD-10-CM | POA: Diagnosis not present

## 2015-02-08 DIAGNOSIS — Z96651 Presence of right artificial knee joint: Secondary | ICD-10-CM | POA: Diagnosis not present

## 2015-02-10 DIAGNOSIS — F17210 Nicotine dependence, cigarettes, uncomplicated: Secondary | ICD-10-CM | POA: Diagnosis not present

## 2015-02-10 DIAGNOSIS — Z471 Aftercare following joint replacement surgery: Secondary | ICD-10-CM | POA: Diagnosis not present

## 2015-02-10 DIAGNOSIS — I10 Essential (primary) hypertension: Secondary | ICD-10-CM | POA: Diagnosis not present

## 2015-02-10 DIAGNOSIS — J449 Chronic obstructive pulmonary disease, unspecified: Secondary | ICD-10-CM | POA: Diagnosis not present

## 2015-02-10 DIAGNOSIS — Z96651 Presence of right artificial knee joint: Secondary | ICD-10-CM | POA: Diagnosis not present

## 2015-02-10 DIAGNOSIS — F319 Bipolar disorder, unspecified: Secondary | ICD-10-CM | POA: Diagnosis not present

## 2015-02-14 ENCOUNTER — Ambulatory Visit: Payer: Medicare Other | Attending: Orthopedic Surgery | Admitting: Physical Therapy

## 2015-02-14 DIAGNOSIS — R29898 Other symptoms and signs involving the musculoskeletal system: Secondary | ICD-10-CM

## 2015-02-14 DIAGNOSIS — M25561 Pain in right knee: Secondary | ICD-10-CM | POA: Diagnosis not present

## 2015-02-14 DIAGNOSIS — R262 Difficulty in walking, not elsewhere classified: Secondary | ICD-10-CM | POA: Diagnosis not present

## 2015-02-14 DIAGNOSIS — M25661 Stiffness of right knee, not elsewhere classified: Secondary | ICD-10-CM | POA: Diagnosis not present

## 2015-02-14 DIAGNOSIS — R269 Unspecified abnormalities of gait and mobility: Secondary | ICD-10-CM | POA: Insufficient documentation
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
## 2015-02-17 ENCOUNTER — Ambulatory Visit: Payer: Medicare Other | Admitting: Physical Therapy

## 2015-02-17 DIAGNOSIS — R262 Difficulty in walking, not elsewhere classified: Secondary | ICD-10-CM

## 2015-02-17 DIAGNOSIS — R269 Unspecified abnormalities of gait and mobility: Secondary | ICD-10-CM

## 2015-02-17 DIAGNOSIS — M25661 Stiffness of right knee, not elsewhere classified: Secondary | ICD-10-CM | POA: Diagnosis not present

## 2015-02-17 DIAGNOSIS — M25561 Pain in right knee: Secondary | ICD-10-CM

## 2015-02-17 DIAGNOSIS — R29898 Other symptoms and signs involving the musculoskeletal system: Secondary | ICD-10-CM | POA: Diagnosis not present

## 2015-02-17 NOTE — Therapy (Signed)
[facility] [address]  [place], [place], [address] Phone: [phone]   Fax:  [phone]  Physical Therapy Treatment  Patient Details  Name: Andrew [name] MRN: [id number] Date of Birth: [birth year] Referring Provider:  Meredith Pel, MD  Encounter Date: 02/17/2015      PT End of Session - 02/17/15 0854    Visit Number 2   Number of Visits 8   Date for PT Re-Evaluation 03/14/15   PT Start Time 0849   PT Stop Time 0927   PT Time Calculation (min) 38 min      Past Medical History  Diagnosis Date  . Umbilical hernia   . Anxiety   . Depression   . Hypertension     takes HCTZ daily  . Hyperlipidemia     takes Zetia daily  . History of bronchitis     1 month ago and treated with Amoxicillin and given an inhaler;takes Prenisone  . Pneumonia     hx of 2 yrs ago  . Generalized headaches     history of migraines-last one 02/02/14  . Dizziness   . Arthritis     right knee  . Joint pain   . Joint swelling   . Back pain     occasionally  . GERD (gastroesophageal reflux disease)     takes Prilosec daily as needed  . Headache   . Polysubstance abuse   . COPD (chronic obstructive pulmonary disease)   . Tuberculosis     treated for 1 year  . Bipolar disorder     denies  . Schizoaffective disorder     denies  . Hemorrhoids     Past Surgical History  Procedure Laterality Date  . Lipoma excision  ~ 2010    "back left side of my head"  . Esophagogastroduodenoscopy    . Knee arthroscopy with medial menisectomy Right 03/01/2014    Procedure: KNEE ARTHROSCOPY WITH MEDIAL MENISECTOMY;  Surgeon: Meredith Pel, MD;  Location: [facility];  Service: Orthopedics;  Laterality: Right;  . Joint replacement    . Total knee arthroplasty Right   . Partial knee arthroplasty Right 01/24/2015    Procedure: RIGHT KNEE MEDIAL COMPARTMENT REPLACEMENT VS TOTAL KNEE ARTHROPLASTY.;  Surgeon: Meredith Pel, MD;   Location: [facility];  Service: Orthopedics;  Laterality: Right;  RIGHT KNEE MEDIAL COMPARTMENT REPLACEMENT VS TOTAL KNEE ARTHROPLASTY.    There were no vitals filed for this visit.  Visit Diagnosis:  Knee stiffness, right  Right leg weakness  Difficulty walking  Right knee pain  Abnormal gait      Subjective Assessment - 02/17/15 0855    Subjective states took his meds today and is pain-free at rest but still painful with stretching and exercise but no number given.  Pt has to leave early due to MD appointment.  He states he has been performing HEP.   Currently in Pain? No/denies         TODAY'S TREATMENT Manual - R Knee Extension mobes grade 3, 2 strips kinesiotape medial and lateral knee joint line due to c/o very tender these 2 areas TherEx - Rec Bike, no resistance, 4' partial ROM B FOB (55cm) tuck/extension for AAROM 30x Bridge 15x QS into black bolster with ankle on 6" foam roll 10x3"  6" FW step-up 12x with single pole A 8" FW step-up 12x with single pole A TKE ball on wall 10x3" 6" side step-up with single pole  A 12x Knee Flexion Machine - DL concentric, R Eccentric 25# 10x  Gait Training - SPC training focus on proper hand (wants to carry in R) and good heel strike with increasing TKE.            PT Short Term Goals - 02/17/15 0930    PT SHORT TERM GOAL #1   Title pt independent with initial HEP by 02/21/15   Status Achieved           PT Long Term Goals - 02/17/15 0930    PT LONG TERM GOAL #1   Title pt able to ambulate without need for AD over level and uneven terrain by 03/14/15   Status On-going   PT LONG TERM GOAL #2   Title pt able to ascend/descend stairs with reciprocal gait and no need for rail use by 03/14/15   Status On-going   PT LONG TERM GOAL #3   Title R Knee AROM 0-115 by 03/13/14   Status On-going   PT LONG TERM GOAL #4   Title pt able to perform squat with good mechanics to parallel and no wt shift to L by 03/14/15   Status On-going                Plan - 02/17/15 0929    Clinical Impression Statement very motivated, pushes self hard.  Lacking TKE with gait and exercise.  Did not re-assess ROM today due to pt having to leave early for MD appointment.   PT Next Visit Plan re-assess ROM, manual for ROM, strengthening and gait training   Consulted and Agree with Plan of Care Patient        Problem List Patient Active Problem List   Diagnosis Date Noted  . Arthritis of knee, degenerative 01/24/2015  . Right knee pain 01/07/2014  . Concern about STD in male without diagnosis 01/06/2014  . Dysuria 12/09/2013  . High cholesterol 10/25/2013  . GERD (gastroesophageal reflux disease) 09/20/2013  . History of positive PPD 07/17/2012  . Cocaine abuse 04/02/2012  . Rhabdomyolysis 04/02/2012  . Hernia of abdominal wall 01/05/2011  . ERECTILE DYSFUNCTION, PSYCHOGENIC 02/13/2010  . BIPOLAR DISORDER UNSPECIFIED 01/31/2009  . TOBACCO ABUSE 01/31/2009  . ELEVATED BP READING WITHOUT DX HYPERTENSION 01/31/2009    Lesa Vandall PT, OCS 02/17/2015, 9:30 AM  [facility] [place], [place], [address] Phone: [phone]   Fax:  [phone]

## 2015-02-21 ENCOUNTER — Ambulatory Visit: Payer: Medicare Other | Admitting: Physical Therapy

## 2015-02-21 DIAGNOSIS — R262 Difficulty in walking, not elsewhere classified: Secondary | ICD-10-CM | POA: Diagnosis not present

## 2015-02-21 DIAGNOSIS — R29898 Other symptoms and signs involving the musculoskeletal system: Secondary | ICD-10-CM

## 2015-02-21 DIAGNOSIS — M25561 Pain in right knee: Secondary | ICD-10-CM | POA: Diagnosis not present

## 2015-02-21 DIAGNOSIS — R269 Unspecified abnormalities of gait and mobility: Secondary | ICD-10-CM | POA: Diagnosis not present

## 2015-02-21 DIAGNOSIS — M25661 Stiffness of right knee, not elsewhere classified: Secondary | ICD-10-CM | POA: Diagnosis not present

## 2015-02-21 NOTE — Therapy (Signed)
[facility] [address]  [place], [place], [address] Phone: [phone]   Fax:  [phone]  Physical Therapy Treatment  Patient Details  Name: Andrew [name] MRN: [id number] Date of Birth: 1966-03-26 Referring Provider:  Meredith Pel, MD  Encounter Date: 02/21/2015      PT End of Session - 02/21/15 0740    Visit Number 3   Number of Visits 8   Date for PT Re-Evaluation 03/14/15   PT Start Time 0730   PT Stop Time 0819   PT Time Calculation (min) 49 min      Past Medical History  Diagnosis Date  . Umbilical hernia   . Anxiety   . Depression   . Hypertension     takes HCTZ daily  . Hyperlipidemia     takes Zetia daily  . History of bronchitis     1 month ago and treated with Amoxicillin and given an inhaler;takes Prenisone  . Pneumonia     hx of 2 yrs ago  . Generalized headaches     history of migraines-last one 02/02/14  . Dizziness   . Arthritis     right knee  . Joint pain   . Joint swelling   . Back pain     occasionally  . GERD (gastroesophageal reflux disease)     takes Prilosec daily as needed  . Headache   . Polysubstance abuse   . COPD (chronic obstructive pulmonary disease)   . Tuberculosis     treated for 1 year  . Bipolar disorder     denies  . Schizoaffective disorder     denies  . Hemorrhoids     Past Surgical History  Procedure Laterality Date  . Lipoma excision  ~ 2010    "back left side of my head"  . Esophagogastroduodenoscopy    . Knee arthroscopy with medial menisectomy Right 03/01/2014    Procedure: KNEE ARTHROSCOPY WITH MEDIAL MENISECTOMY;  Surgeon: Meredith Pel, MD;  Location: [facility];  Service: Orthopedics;  Laterality: Right;  . Joint replacement    . Total knee arthroplasty Right   . Partial knee arthroplasty Right 01/24/2015    Procedure: RIGHT KNEE MEDIAL COMPARTMENT REPLACEMENT VS TOTAL KNEE ARTHROPLASTY.;  Surgeon: Meredith Pel, MD;   Location: [facility];  Service: Orthopedics;  Laterality: Right;  RIGHT KNEE MEDIAL COMPARTMENT REPLACEMENT VS TOTAL KNEE ARTHROPLASTY.    There were no vitals filed for this visit.  Visit Diagnosis:  Knee stiffness, right  Right leg weakness  Difficulty walking  Right knee pain  Abnormal gait      Subjective Assessment - 02/21/15 0738    Subjective States weather makes knee feel stiff and sore today.  He left SPC in car today but states does uses all the time otherwise.   Patient Stated Goals get back to working   Currently in Pain? Yes   Pain Score 7    Pain Location Knee   Pain Orientation Right        TODAY'S TREATMENT (pt 15 minutes late) TherEx - Rec Bike, no resistance, 4' partial ROM Supine FOB tuck and Bridge combo 15x  Knee Flexion Machine - DL concentric, R Eccentric 25# 12x R Knee Ext Stretch on Knee Flexion Machine 25# 90" Rocker DF and Knee Extension Stretch 3x20" TRX Squat 10x 6" FW step-up 10x with single pole A 8" FW step-up 10 with single pole A  Manual - R Knee Extension  mobes grade 3 to 4  Vasopneumatic Compression R Knee, Med pressure, 15', 40dg               PT Short Term Goals - 02/17/15 0930    PT SHORT TERM GOAL #1   Title pt independent with initial HEP by 02/21/15   Status Achieved           PT Long Term Goals - 02/17/15 0930    PT LONG TERM GOAL #1   Title pt able to ambulate without need for AD over level and uneven terrain by 03/14/15   Status On-going   PT LONG TERM GOAL #2   Title pt able to ascend/descend stairs with reciprocal gait and no need for rail use by 03/14/15   Status On-going   PT LONG TERM GOAL #3   Title R Knee AROM 0-115 by 03/13/14   Status On-going   PT LONG TERM GOAL #4   Title pt able to perform squat with good mechanics to parallel and no wt shift to L by 03/14/15   Status On-going               Plan - 02/21/15 0846    Clinical Impression Statement pt late today so no measurements and manual  only for extension.  Seems to be improving.  Good participation/motivation.   PT Next Visit Plan re-assess ROM, manual for ROM, strengthening and gait training   Consulted and Agree with Plan of Care Patient        Problem List Patient Active Problem List   Diagnosis Date Noted  . Arthritis of knee, degenerative 01/24/2015  . Right knee pain 01/07/2014  . Concern about STD in male without diagnosis 01/06/2014  . Dysuria 12/09/2013  . High cholesterol 10/25/2013  . GERD (gastroesophageal reflux disease) 09/20/2013  . History of positive PPD 07/17/2012  . Cocaine abuse 04/02/2012  . Rhabdomyolysis 04/02/2012  . Hernia of abdominal wall 01/05/2011  . ERECTILE DYSFUNCTION, PSYCHOGENIC 02/13/2010  . BIPOLAR DISORDER UNSPECIFIED 01/31/2009  . TOBACCO ABUSE 01/31/2009  . ELEVATED BP READING WITHOUT DX HYPERTENSION 01/31/2009    Roxana Lai PT, OCS 02/21/2015, 8:46 AM  [facility] [place], [place], [address] Phone: [phone]   Fax:  [phone]

## 2015-02-22 ENCOUNTER — Ambulatory Visit: Payer: Medicare Other | Admitting: Physical Therapy

## 2015-02-22 DIAGNOSIS — M25561 Pain in right knee: Secondary | ICD-10-CM | POA: Diagnosis not present

## 2015-02-22 DIAGNOSIS — R29898 Other symptoms and signs involving the musculoskeletal system: Secondary | ICD-10-CM | POA: Diagnosis not present

## 2015-02-22 DIAGNOSIS — M25661 Stiffness of right knee, not elsewhere classified: Secondary | ICD-10-CM

## 2015-02-22 DIAGNOSIS — R262 Difficulty in walking, not elsewhere classified: Secondary | ICD-10-CM

## 2015-02-22 DIAGNOSIS — R269 Unspecified abnormalities of gait and mobility: Secondary | ICD-10-CM

## 2015-02-22 NOTE — Therapy (Signed)
[facility] [address]  [place], [place], [address] Phone: [phone]   Fax:  [phone]  Physical Therapy Treatment  Patient Details  Name: NGAI PARCELL MRN: [id number] Date of Birth: Nov 13, 1965 Referring Provider:  Lucianne Lei, MD  Encounter Date: 02/22/2015      PT End of Session - 02/22/15 0727    Visit Number 4   Number of Visits 8   Date for PT Re-Evaluation 03/14/15   PT Start Time 0717      Past Medical History  Diagnosis Date  . Umbilical hernia   . Anxiety   . Depression   . Hypertension     takes HCTZ daily  . Hyperlipidemia     takes Zetia daily  . History of bronchitis     1 month ago and treated with Amoxicillin and given an inhaler;takes Prenisone  . Pneumonia     hx of 2 yrs ago  . Generalized headaches     history of migraines-last one 02/02/14  . Dizziness   . Arthritis     right knee  . Joint pain   . Joint swelling   . Back pain     occasionally  . GERD (gastroesophageal reflux disease)     takes Prilosec daily as needed  . Headache   . Polysubstance abuse   . COPD (chronic obstructive pulmonary disease)   . Tuberculosis     treated for 1 year  . Bipolar disorder     denies  . Schizoaffective disorder     denies  . Hemorrhoids     Past Surgical History  Procedure Laterality Date  . Lipoma excision  ~ 2010    "back left side of my head"  . Esophagogastroduodenoscopy    . Knee arthroscopy with medial menisectomy Right 03/01/2014    Procedure: KNEE ARTHROSCOPY WITH MEDIAL MENISECTOMY;  Surgeon: Meredith Pel, MD;  Location: [facility];  Service: Orthopedics;  Laterality: Right;  . Joint replacement    . Total knee arthroplasty Right   . Partial knee arthroplasty Right 01/24/2015    Procedure: RIGHT KNEE MEDIAL COMPARTMENT REPLACEMENT VS TOTAL KNEE ARTHROPLASTY.;  Surgeon: Meredith Pel, MD;  Location: [facility];  Service: Orthopedics;  Laterality: Right;  RIGHT  KNEE MEDIAL COMPARTMENT REPLACEMENT VS TOTAL KNEE ARTHROPLASTY.    There were no vitals filed for this visit.  Visit Diagnosis:  Knee stiffness, right  Right leg weakness  Difficulty walking  Right knee pain  Abnormal gait      Subjective Assessment - 02/22/15 0725    Subjective states knee feels as though is getting better.  States his dog bit his toe yesterday while he was sitting and this caused him to jerk foot away and max out R knee flexion.  Noted pain following this but states knee bent very far.   Currently in Pain? Yes   Pain Score 7    Pain Location Knee   Pain Orientation Right            TODAY'S TREATMENT TherEx - NuStep lvl 5, 8' (increasing knee flexion) Supine FOB tuck and Bridge combo 15x  QS 15x3" Standing TKE Black TB 2x15 Rocker DF Ankle and Knee Extension stretch 3x20" Leg Press 25# 2x15 (R TKE into PT's knee) 6" Side step-up 15x with single pole A TRX Squat 15x SAQ 0# 10x  Manual - R Knee Flexion and Extension mobes grade 3 to 4  Vasopneumatic Compression R  Knee, Med pressure, 15', 40dg          PT Short Term Goals - 02/17/15 0930    PT SHORT TERM GOAL #1   Title pt independent with initial HEP by 02/21/15   Status Achieved           PT Long Term Goals - 02/17/15 0930    PT LONG TERM GOAL #1   Title pt able to ambulate without need for AD over level and uneven terrain by 03/14/15   Status On-going   PT LONG TERM GOAL #2   Title pt able to ascend/descend stairs with reciprocal gait and no need for rail use by 03/14/15   Status On-going   PT LONG TERM GOAL #3   Title R Knee AROM 0-115 by 03/13/14   Status On-going   PT LONG TERM GOAL #4   Title pt able to perform squat with good mechanics to parallel and no wt shift to L by 03/14/15   Status On-going               Plan - 02/21/15 0846    Clinical Impression Statement pt late today so no measurements and manual only for extension.  Seems to be improving.  Good  participation/motivation.   PT Next Visit Plan re-assess ROM, manual for ROM, strengthening and gait training   Consulted and Agree with Plan of Care Patient        Problem List Patient Active Problem List   Diagnosis Date Noted  . Arthritis of knee, degenerative 01/24/2015  . Right knee pain 01/07/2014  . Concern about STD in male without diagnosis 01/06/2014  . Dysuria 12/09/2013  . High cholesterol 10/25/2013  . GERD (gastroesophageal reflux disease) 09/20/2013  . History of positive PPD 07/17/2012  . Cocaine abuse 04/02/2012  . Rhabdomyolysis 04/02/2012  . Hernia of abdominal wall 01/05/2011  . ERECTILE DYSFUNCTION, PSYCHOGENIC 02/13/2010  . BIPOLAR DISORDER UNSPECIFIED 01/31/2009  . TOBACCO ABUSE 01/31/2009  . ELEVATED BP READING WITHOUT DX HYPERTENSION 01/31/2009    Arcadio Cope PT, OCS 02/22/2015, 7:28 AM  [facility] [place], [place], [address] Phone: [phone]   Fax:  [phone]

## 2015-03-01 ENCOUNTER — Ambulatory Visit: Payer: Medicare Other | Admitting: Physical Therapy

## 2015-03-01 DIAGNOSIS — R262 Difficulty in walking, not elsewhere classified: Secondary | ICD-10-CM

## 2015-03-01 DIAGNOSIS — M25661 Stiffness of right knee, not elsewhere classified: Secondary | ICD-10-CM

## 2015-03-01 DIAGNOSIS — R269 Unspecified abnormalities of gait and mobility: Secondary | ICD-10-CM | POA: Diagnosis not present

## 2015-03-01 DIAGNOSIS — R29898 Other symptoms and signs involving the musculoskeletal system: Secondary | ICD-10-CM | POA: Diagnosis not present

## 2015-03-01 DIAGNOSIS — M25561 Pain in right knee: Secondary | ICD-10-CM | POA: Diagnosis not present

## 2015-03-01 NOTE — Therapy (Signed)
[facility] [address]  [place], [place], [address] Phone: [phone]   Fax:  [phone]  Physical Therapy Treatment  Patient Details  Name: MODESTO GANOE MRN: [id number] Date of Birth: [birth year] Referring Provider:  Meredith Pel, MD  Encounter Date: 03/01/2015      PT End of Session - 03/01/15 0726    Visit Number 5   Number of Visits 8   Date for PT Re-Evaluation 03/14/15   PT Start Time 0076   PT Stop Time 0828   PT Time Calculation (min) 71 min          There were no vitals filed for this visit.  Visit Diagnosis:  Knee stiffness, right  Right leg weakness  Difficulty walking  Right knee pain  Abnormal gait      Subjective Assessment - 03/01/15 0728    Subjective States was ill last week and over the weekend. Mornings typically more painful and stiff. States has not been able to perform HEP regularly or go to gym lately due to illness but states did purchase pball and performed TKE into ball a few times.   Currently in Pain? Yes   Pain Score 7    Pain Location Knee   Pain Orientation Right            [facility] PT Assessment - 03/01/15 0001    AROM   AROM Assessment Site Knee   Right/Left Knee Right   Right Knee Extension 16   Right Knee Flexion 95   PROM   Overall PROM Comments R Knee 5-100   PROM Assessment Site --   Right/Left Knee --        TODAY'S TREATMENT TherEx - Rec Bike, no resistance, partial to full revolutions 6' R Leg Press 35# 15x2 sets (focus on TKE) Rocker DF Ankle and Knee Extension stretch 3x20"  Manual - R Knee Flexion and Extension mobes grade 3 to 4  Knee Flexion Machine 35#, B Concentric, R Eccentric with focus on full ROM 12x TRX Lunge (R Lead) 10x Knee Extension Machine 25#, B Concentric, R Eccentric 12x 8" FW Step-up with Single Pole A 15x Standing TKE Black TB x15  Vasopneumatic Compression R Knee, Med pressure, 15',  40dg           PT Short Term Goals - 02/17/15 0930    PT SHORT TERM GOAL #1   Title pt independent with initial HEP by 02/21/15   Status Achieved           PT Long Term Goals - 03/01/15 0822    PT LONG TERM GOAL #1   Title pt able to ambulate without need for AD over level and uneven terrain by 03/14/15  does not use AD indoors but typically uses while away from home   Status Partially Met   PT LONG TERM GOAL #2   Title pt able to ascend/descend stairs with reciprocal gait and no need for rail use by 03/14/15   Status On-going   PT LONG TERM GOAL #3   Title R Knee AROM 0-115 by 03/13/14  AROM 16-95   Status On-going   PT LONG TERM GOAL #4   Title pt able to perform squat with good mechanics to parallel and no wt shift to L by 03/14/15   Status On-going               Plan - 03/01/15 0824    Clinical Impression  Statement pt is progressing well with regard to level of function and Flexion ROM.  TKE PROM has improved to within 5 degrees of neutral but TKE AROM still quite limited (15 degrees from neutral).  Gait is improving but current mechanics limited by lack of TKE at heel strike. Pt very motivated and pushes self hard but was unable to perform HEP regularly or go to gym past few days due to illness.  Pt has 3 visits remainin on current POC but will likely require 2-4 more to meet all PT goals.   Pt will benefit from skilled therapeutic intervention in order to improve on the following deficits Pain;Decreased strength;Decreased mobility;Impaired flexibility;Improper body mechanics;Abnormal gait;Decreased range of motion;Difficulty walking;Increased edema   Rehab Potential Good   PT Frequency 2x / week   PT Duration 3 weeks   PT Treatment/Interventions Manual techniques;Therapeutic exercise;Therapeutic activities;Gait training;Stair training;Balance training;Patient/family education;Scar mobilization;ADLs/Self Care Home Management;Electrical Stimulation;Cryotherapy   PT Next  Visit Plan manual for ROM, TKE training, strengthening and gait training   Consulted and Agree with Plan of Care Patient         [facility] PT, OCS 03/01/2015, 8:34 AM  [facility] [address]  [place], [place], [address] Phone: [phone]   Fax:  [phone]

## 2015-03-03 ENCOUNTER — Ambulatory Visit: Payer: Medicare Other | Admitting: Physical Therapy

## 2015-03-03 DIAGNOSIS — R269 Unspecified abnormalities of gait and mobility: Secondary | ICD-10-CM

## 2015-03-03 DIAGNOSIS — M25561 Pain in right knee: Secondary | ICD-10-CM | POA: Diagnosis not present

## 2015-03-03 DIAGNOSIS — R262 Difficulty in walking, not elsewhere classified: Secondary | ICD-10-CM

## 2015-03-03 DIAGNOSIS — R29898 Other symptoms and signs involving the musculoskeletal system: Secondary | ICD-10-CM | POA: Diagnosis not present

## 2015-03-03 DIAGNOSIS — M25661 Stiffness of right knee, not elsewhere classified: Secondary | ICD-10-CM | POA: Diagnosis not present

## 2015-03-03 NOTE — Therapy (Signed)
[facility] [address]  [place], [place], [address] Phone: [phone]   Fax:  [phone]  Physical Therapy Treatment  Patient Details  Name: Andrew [name] MRN: [id number] Date of Birth: June 30, 1966 Referring Provider:  Meredith Pel, MD  Encounter Date: 03/03/2015      PT End of Session - 03/03/15 0717    Visit Number 6   Number of Visits 12   Date for PT Re-Evaluation 03/22/15   PT Start Time 0715   PT Stop Time 0800   PT Time Calculation (min) 45 min      Past Medical History  Diagnosis Date  . Umbilical hernia   . Anxiety   . Depression   . Hypertension     takes HCTZ daily  . Hyperlipidemia     takes Zetia daily  . History of bronchitis     1 month ago and treated with Amoxicillin and given an inhaler;takes Prenisone  . Pneumonia     hx of 2 yrs ago  . Generalized headaches     history of migraines-last one 02/02/14  . Dizziness   . Arthritis     right knee  . Joint pain   . Joint swelling   . Back pain     occasionally  . GERD (gastroesophageal reflux disease)     takes Prilosec daily as needed  . Headache   . Polysubstance abuse   . COPD (chronic obstructive pulmonary disease)   . Tuberculosis     treated for 1 year  . Bipolar disorder     denies  . Schizoaffective disorder     denies  . Hemorrhoids     Past Surgical History  Procedure Laterality Date  . Lipoma excision  ~ 2010    "back left side of my head"  . Esophagogastroduodenoscopy    . Knee arthroscopy with medial menisectomy Right 03/01/2014    Procedure: KNEE ARTHROSCOPY WITH MEDIAL MENISECTOMY;  Surgeon: Meredith Pel, MD;  Location: [facility];  Service: Orthopedics;  Laterality: Right;  . Joint replacement    . Total knee arthroplasty Right   . Partial knee arthroplasty Right 01/24/2015    Procedure: RIGHT KNEE MEDIAL COMPARTMENT REPLACEMENT VS TOTAL KNEE ARTHROPLASTY.;  Surgeon: Meredith Pel, MD;   Location: [facility];  Service: Orthopedics;  Laterality: Right;  RIGHT KNEE MEDIAL COMPARTMENT REPLACEMENT VS TOTAL KNEE ARTHROPLASTY.    There were no vitals filed for this visit.  Visit Diagnosis:  Knee stiffness, right  Right leg weakness  Difficulty walking  Right knee pain  Abnormal gait      Subjective Assessment - 03/03/15 0718    Subjective States went to MD and MD is pleased with progress so far.  Pt states MD signed renewal (pt forgot to bring it back however but states will bring next appointment).   Currently in Pain? Yes   Pain Score 6    Pain Location Knee   Pain Orientation Right   Pain Descriptors / Indicators Aching;Tightness           TODAY'S TREATMENT TherEx -  NuStep lvl 6, 8' R Leg Press 35# 20x, 45# 20x TM Hamstring pull 15x TM TKE Push 15x Fitter FW Lunge/TKE 1 Black+1Blue 15x2 sets with B Pole A 8" Side Step-up with Single Pole A 15x2 sets (focus on TKE) Fitter BW Lunge 1 Black+1 Blue 15x2 sets with B Pole A Rocker DF Ankle and Knee Extension stretch 3x20"  Knee Flexion Machine 45#, B Concentric, R Eccentric with focus on full ROM 15x Knee Extension Machine 25#, B Concentric, R Eccentric 15x            PT Short Term Goals - 02/17/15 0930    PT SHORT TERM GOAL #1   Title pt independent with initial HEP by 02/21/15   Status Achieved           PT Long Term Goals - 03/01/15 0822    PT LONG TERM GOAL #1   Title pt able to ambulate without need for AD over level and uneven terrain by 03/14/15  does not use AD indoors but typically uses while away from home   Status Partially Met   PT [place] #2   Title pt able to ascend/descend stairs with reciprocal gait and no need for rail use by 03/14/15   Status On-going   PT LONG TERM GOAL #3   Title R Knee AROM 0-115 by 03/13/14  AROM 16-95   Status On-going   PT LONG TERM GOAL #4   Title pt able to perform squat with good mechanics to parallel and no wt shift to L by 03/14/15   Status  On-going               Plan - 03/03/15 0749    Clinical Impression Statement Progressing well overall with chief limitation R knee TKE which was addressed throughout today's treatment. Continues with high motivation but tired today due to staying up late so moved a little slower.   PT Next Visit Plan manual for ROM, TKE training, strengthening and gait training   Consulted and Agree with Plan of Care Patient        Problem List Patient Active Problem List   Diagnosis Date Noted  . Arthritis of knee, degenerative 01/24/2015  . Right knee pain 01/07/2014  . Concern about STD in male without diagnosis 01/06/2014  . Dysuria 12/09/2013  . High cholesterol 10/25/2013  . GERD (gastroesophageal reflux disease) 09/20/2013  . History of positive PPD 07/17/2012  . Cocaine abuse 04/02/2012  . Rhabdomyolysis 04/02/2012  . Hernia of abdominal wall 01/05/2011  . ERECTILE DYSFUNCTION, PSYCHOGENIC 02/13/2010  . BIPOLAR DISORDER UNSPECIFIED 01/31/2009  . TOBACCO ABUSE 01/31/2009  . ELEVATED BP READING WITHOUT DX HYPERTENSION 01/31/2009    Pharell Rolfson PT, OCS 03/03/2015, 8:08 AM  [facility] [place], [place], [address] Phone: [phone]   Fax:  [phone]

## 2015-03-08 ENCOUNTER — Ambulatory Visit: Payer: Medicare Other | Attending: Orthopedic Surgery | Admitting: Rehabilitation

## 2015-03-08 DIAGNOSIS — R269 Unspecified abnormalities of gait and mobility: Secondary | ICD-10-CM | POA: Diagnosis not present

## 2015-03-08 DIAGNOSIS — R29898 Other symptoms and signs involving the musculoskeletal system: Secondary | ICD-10-CM | POA: Insufficient documentation

## 2015-03-08 DIAGNOSIS — R262 Difficulty in walking, not elsewhere classified: Secondary | ICD-10-CM | POA: Insufficient documentation

## 2015-03-08 DIAGNOSIS — M25661 Stiffness of right knee, not elsewhere classified: Secondary | ICD-10-CM

## 2015-03-08 DIAGNOSIS — M25561 Pain in right knee: Secondary | ICD-10-CM | POA: Diagnosis not present

## 2015-03-08 NOTE — Therapy (Signed)
[facility] [address]  [place], [place], [address] Phone: [phone]   Fax:  [phone]  Physical Therapy Treatment  Patient Details  Name: Andrew [name] MRN: [id number] Date of Birth: 13-Sep-1966 Referring Provider:  Meredith Pel, MD  Encounter Date: 03/08/2015      PT End of Session - 03/08/15 1531    Visit Number 7   Number of Visits 12   Date for PT Re-Evaluation 03/22/15   PT Start Time 0177   PT Stop Time 9390   PT Time Calculation (min) 43 min      Past Medical History  Diagnosis Date  . Umbilical hernia   . Anxiety   . Depression   . Hypertension     takes HCTZ daily  . Hyperlipidemia     takes Zetia daily  . History of bronchitis     1 month ago and treated with Amoxicillin and given an inhaler;takes Prenisone  . Pneumonia     hx of 2 yrs ago  . Generalized headaches     history of migraines-last one 02/02/14  . Dizziness   . Arthritis     right knee  . Joint pain   . Joint swelling   . Back pain     occasionally  . GERD (gastroesophageal reflux disease)     takes Prilosec daily as needed  . Headache   . Polysubstance abuse   . COPD (chronic obstructive pulmonary disease)   . Tuberculosis     treated for 1 year  . Bipolar disorder     denies  . Schizoaffective disorder     denies  . Hemorrhoids     Past Surgical History  Procedure Laterality Date  . Lipoma excision  ~ 2010    "back left side of my head"  . Esophagogastroduodenoscopy    . Knee arthroscopy with medial menisectomy Right 03/01/2014    Procedure: KNEE ARTHROSCOPY WITH MEDIAL MENISECTOMY;  Surgeon: Meredith Pel, MD;  Location: [facility];  Service: Orthopedics;  Laterality: Right;  . Joint replacement    . Total knee arthroplasty Right   . Partial knee arthroplasty Right 01/24/2015    Procedure: RIGHT KNEE MEDIAL COMPARTMENT REPLACEMENT VS TOTAL KNEE ARTHROPLASTY.;  Surgeon: Meredith Pel, MD;   Location: [facility];  Service: Orthopedics;  Laterality: Right;  RIGHT KNEE MEDIAL COMPARTMENT REPLACEMENT VS TOTAL KNEE ARTHROPLASTY.    There were no vitals filed for this visit.  Visit Diagnosis:  Knee stiffness, right  Right leg weakness  Difficulty walking  Right knee pain  Abnormal gait      Subjective Assessment - 03/08/15 1533    Subjective Pt brought in renewal today. Thinks MD will let him go back to work next week.    Currently in Pain? Yes   Pain Score 7    Pain Location Knee   Pain Orientation Right   Pain Descriptors / Indicators Aching      TODAY'S TREATMENT TherEx -  Rec Bike level 0-1x6' partial to full revolutions TM Hamstring Pull 15x TM TKE Push 15x TRX Squats 15x  TRX Lunges 10x  R Leg Press 45# 20x 8" FW Step-ups with Single Pole A 20x Rocker DF Ankle and Knee Extension stretch 3x20" 8" Side Step-ups with Single Pole A 20x with focus on TKE  Knee Extension Machine 25#, B Concentric, R Eccentric 20x2  Knee Flexion Machine 45#, B Concentric, R Eccentric with focus on full ROM  into flexion and extension 20x       PT Short Term Goals - 02/17/15 0930    PT SHORT TERM GOAL #1   Title pt independent with initial HEP by 02/21/15   Status Achieved           PT Long Term Goals - 03/01/15 0822    PT LONG TERM GOAL #1   Title pt able to ambulate without need for AD over level and uneven terrain by 03/14/15  does not use AD indoors but typically uses while away from home   Status Partially Met   PT LONG TERM GOAL #2   Title pt able to ascend/descend stairs with reciprocal gait and no need for rail use by 03/14/15   Status On-going   PT LONG TERM GOAL #3   Title R Knee AROM 0-115 by 03/13/14  AROM 16-95   Status On-going   PT LONG TERM GOAL #4   Title pt able to perform squat with good mechanics to parallel and no wt shift to L by 03/14/15   Status On-going               Plan - 03/08/15 1614    Clinical Impression Statement Good progress with  increased reps. Continues to lack TKE which is noticable through most exercises and gait.    PT Next Visit Plan manual for ROM, TKE training, strengthening and gait training. Measure ROM.         Problem List Patient Active Problem List   Diagnosis Date Noted  . Arthritis of knee, degenerative 01/24/2015  . Right knee pain 01/07/2014  . Concern about STD in male without diagnosis 01/06/2014  . Dysuria 12/09/2013  . High cholesterol 10/25/2013  . GERD (gastroesophageal reflux disease) 09/20/2013  . History of positive PPD 07/17/2012  . Cocaine abuse 04/02/2012  . Rhabdomyolysis 04/02/2012  . Hernia of abdominal wall 01/05/2011  . ERECTILE DYSFUNCTION, PSYCHOGENIC 02/13/2010  . BIPOLAR DISORDER UNSPECIFIED 01/31/2009  . TOBACCO ABUSE 01/31/2009  . ELEVATED BP READING WITHOUT DX HYPERTENSION 01/31/2009    Barbette Hair, PTA 03/08/2015, 4:18 PM  [facility] [place], [place], [address] Phone: [phone]   Fax:  [phone]

## 2015-03-10 ENCOUNTER — Ambulatory Visit: Payer: Medicare Other | Admitting: Physical Therapy

## 2015-03-10 DIAGNOSIS — M25561 Pain in right knee: Secondary | ICD-10-CM | POA: Diagnosis not present

## 2015-03-10 DIAGNOSIS — R262 Difficulty in walking, not elsewhere classified: Secondary | ICD-10-CM

## 2015-03-10 DIAGNOSIS — M25661 Stiffness of right knee, not elsewhere classified: Secondary | ICD-10-CM | POA: Diagnosis not present

## 2015-03-10 DIAGNOSIS — R269 Unspecified abnormalities of gait and mobility: Secondary | ICD-10-CM

## 2015-03-10 DIAGNOSIS — R29898 Other symptoms and signs involving the musculoskeletal system: Secondary | ICD-10-CM | POA: Diagnosis not present

## 2015-03-10 NOTE — Therapy (Signed)
[facility] [address]  [place], [place], [address] Phone: [phone]   Fax:  [phone]  Physical Therapy Treatment  Patient Details  Name: Andrew [name] MRN: [id number] Date of Birth: 1966-09-21 Referring Provider:  Meredith Pel, MD  Encounter Date: 03/10/2015      PT End of Session - 03/10/15 0852    Visit Number 8   Number of Visits 12   Date for PT Re-Evaluation 03/22/15   PT Start Time 0800   PT Stop Time 1165   PT Time Calculation (min) 55 min   Activity Tolerance Patient tolerated treatment well   Behavior During Therapy [facility] for tasks assessed/performed      Past Medical History  Diagnosis Date   Umbilical hernia    Anxiety    Depression    Hypertension     takes HCTZ daily   Hyperlipidemia     takes Zetia daily   History of bronchitis     1 month ago and treated with Amoxicillin and given an inhaler;takes Prenisone   Pneumonia     hx of 2 yrs ago   Generalized headaches     history of migraines-last one 02/02/14   Dizziness    Arthritis     right knee   Joint pain    Joint swelling    Back pain     occasionally   GERD (gastroesophageal reflux disease)     takes Prilosec daily as needed   Headache    Polysubstance abuse    COPD (chronic obstructive pulmonary disease)    Tuberculosis     treated for 1 year   Bipolar disorder     denies   Schizoaffective disorder     denies   Hemorrhoids     Past Surgical History  Procedure Laterality Date   Lipoma excision  ~ 2010    "back left side of my head"   Esophagogastroduodenoscopy     Knee arthroscopy with medial menisectomy Right 03/01/2014    Procedure: KNEE ARTHROSCOPY WITH MEDIAL MENISECTOMY;  Surgeon: Meredith Pel, MD;  Location: [facility];  Service: Orthopedics;  Laterality: Right;   Joint replacement     Total knee arthroplasty Right    Partial knee arthroplasty Right 01/24/2015     Procedure: RIGHT KNEE MEDIAL COMPARTMENT REPLACEMENT VS TOTAL KNEE ARTHROPLASTY.;  Surgeon: Meredith Pel, MD;  Location: [facility];  Service: Orthopedics;  Laterality: Right;  RIGHT KNEE MEDIAL COMPARTMENT REPLACEMENT VS TOTAL KNEE ARTHROPLASTY.    There were no vitals filed for this visit.  Visit Diagnosis:  Knee stiffness, right  Right leg weakness  Difficulty walking  Right knee pain  Abnormal gait      Subjective Assessment - 03/10/15 0813    Subjective Still with stiffness.  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  Lacks extension vs Left knee   Patient Stated Goals get back to working   Pain Score 6    Pain Location Knee   Pain Orientation Right   Pain Descriptors / Indicators Aching            TODAY'S TREATMENT Man-  PROM Right knee flex/extx 10' TherEx -  Rec Bike level 0-1x6' partial to full revolutions TM Hamstring Pull 15x TM TKE Push 15x TRX Squats 2x12 TRX Lunges 10x  R Leg Press 45# 2x12 Fwd step down w/ 1 pole + mirror for feed back 8" FW Step-ups with Single Pole A 20x  Rocker DF Ankle and Knee Extension stretch 3x20" Knee Extension Machine 25#x12 35#x12 B Concentric, R Eccentric  Knee Flexion Machine 45#x12, 55#x12 focus on eccentric control and full extension ROM w/ low load prolonged stretch between sets Long sitting HS stretch with strap 5x30" Prone quad stretch with strap 5x30"                       PT Short Term Goals - 02/17/15 0930    PT SHORT TERM GOAL #1   Title pt independent with initial HEP by 02/21/15   Status Achieved           PT Long Term Goals - 03/01/15 0822    PT LONG TERM GOAL #1   Title pt able to ambulate without need for AD over level and uneven terrain by 03/14/15  does not use AD indoors but typically uses while away from home   Status Partially Met   PT LONG TERM GOAL #2   Title pt able to ascend/descend stairs with reciprocal gait and no need for rail use by 03/14/15   Status On-going    PT LONG TERM GOAL #3   Title R Knee AROM 0-115 by 03/13/14  AROM 16-95   Status On-going   PT LONG TERM GOAL #4   Title pt able to perform squat with good mechanics to parallel and no wt shift to L by 03/14/15   Status On-going               Plan - 03/10/15 0853    Clinical Impression Statement Did well with addition of fwd step down.  Demos decreased Right proximal LE weakness with eccentric step down.  Cont to require increased ROM flex/ Ext   Pt will benefit from skilled therapeutic intervention in order to improve on the following deficits Pain;Decreased strength;Decreased mobility;Impaired flexibility;Improper body mechanics;Abnormal gait;Decreased range of motion;Difficulty walking;Increased edema        Problem List Patient Active Problem List   Diagnosis Date Noted   Arthritis of knee, degenerative 01/24/2015   Right knee pain 01/07/2014   Concern about STD in male without diagnosis 01/06/2014   Dysuria 12/09/2013   High cholesterol 10/25/2013   GERD (gastroesophageal reflux disease) 09/20/2013   History of positive PPD 07/17/2012   Cocaine abuse 04/02/2012   Rhabdomyolysis 04/02/2012   Hernia of abdominal wall 01/05/2011   ERECTILE DYSFUNCTION, PSYCHOGENIC 02/13/2010   BIPOLAR DISORDER UNSPECIFIED 01/31/2009   TOBACCO ABUSE 01/31/2009   ELEVATED BP READING WITHOUT DX HYPERTENSION 01/31/2009    Olean Ree, PTA 03/10/2015, 8:57 AM  [facility] [address]  [place], [place], [address] Phone: [phone]   Fax:  [phone]

## 2015-03-13 ENCOUNTER — Ambulatory Visit: Payer: Medicare Other | Admitting: Physical Therapy

## 2015-03-14 ENCOUNTER — Ambulatory Visit: Payer: Medicare Other | Admitting: Physical Therapy

## 2015-03-14 DIAGNOSIS — M25561 Pain in right knee: Secondary | ICD-10-CM

## 2015-03-14 DIAGNOSIS — R262 Difficulty in walking, not elsewhere classified: Secondary | ICD-10-CM | POA: Diagnosis not present

## 2015-03-14 DIAGNOSIS — M25661 Stiffness of right knee, not elsewhere classified: Secondary | ICD-10-CM

## 2015-03-14 DIAGNOSIS — R269 Unspecified abnormalities of gait and mobility: Secondary | ICD-10-CM

## 2015-03-14 DIAGNOSIS — R29898 Other symptoms and signs involving the musculoskeletal system: Secondary | ICD-10-CM | POA: Diagnosis not present

## 2015-03-14 NOTE — Therapy (Signed)
[facility] [address]  [place], [place], [address] Phone: [phone]   Fax:  [phone]  Physical Therapy Treatment  Patient Details  Name: Andrew [name] MRN: [id number] Date of Birth: 1966/08/31 Referring Provider:  Meredith Pel, MD  Encounter Date: 03/14/2015      PT End of Session - 03/14/15 0813    Visit Number 9   Number of Visits 12   Date for PT Re-Evaluation 03/22/15   PT Start Time 0808   PT Stop Time 0844   PT Time Calculation (min) 36 min      Past Medical History  Diagnosis Date  . Umbilical hernia   . Anxiety   . Depression   . Hypertension     takes HCTZ daily  . Hyperlipidemia     takes Zetia daily  . History of bronchitis     1 month ago and treated with Amoxicillin and given an inhaler;takes Prenisone  . Pneumonia     hx of 2 yrs ago  . Generalized headaches     history of migraines-last one 02/02/14  . Dizziness   . Arthritis     right knee  . Joint pain   . Joint swelling   . Back pain     occasionally  . GERD (gastroesophageal reflux disease)     takes Prilosec daily as needed  . Headache   . Polysubstance abuse   . COPD (chronic obstructive pulmonary disease)   . Tuberculosis     treated for 1 year  . Bipolar disorder     denies  . Schizoaffective disorder     denies  . Hemorrhoids     Past Surgical History  Procedure Laterality Date  . Lipoma excision  ~ 2010    "back left side of my head"  . Esophagogastroduodenoscopy    . Knee arthroscopy with medial menisectomy Right 03/01/2014    Procedure: KNEE ARTHROSCOPY WITH MEDIAL MENISECTOMY;  Surgeon: Meredith Pel, MD;  Location: [facility];  Service: Orthopedics;  Laterality: Right;  . Joint replacement    . Total knee arthroplasty Right   . Partial knee arthroplasty Right 01/24/2015    Procedure: RIGHT KNEE MEDIAL COMPARTMENT REPLACEMENT VS TOTAL KNEE ARTHROPLASTY.;  Surgeon: Meredith Pel, MD;   Location: [facility];  Service: Orthopedics;  Laterality: Right;  RIGHT KNEE MEDIAL COMPARTMENT REPLACEMENT VS TOTAL KNEE ARTHROPLASTY.    There were no vitals filed for this visit.  Visit Diagnosis:  Knee stiffness, right  Right leg weakness  Difficulty walking  Right knee pain  Abnormal gait      Subjective Assessment - 03/14/15 0811    Subjective notes AM stiffness, uses SPC "every now and then" mostly in the AM due to stiffness.   Currently in Pain? Yes   Pain Score 6    Pain Location Knee   Pain Orientation Right            [facility] PT Assessment - 03/14/15 0001    ROM / Strength   AROM / PROM / Strength AROM;PROM   AROM   Overall AROM Comments R Knee 11-110   AROM Assessment Site --   Right/Left Knee --   PROM   Overall PROM Comments R Knee 3-113   PROM Assessment Site --   Right/Left Knee --      TODAY'S TREATMENT TherEx - Rec Bike lvl 2, 5' Manual - R knee grade 4 flexion and extension mobes  TherEx - SAQ 0# 10x, 5# 15x 8" FW step-up with Black TB for TKE 15x Rocker ankle DF and Knee Ext Stretch 3x20" TRX DL Squat (excellent depth, minimal shift to L on some reps) TRX Lunge with R Lead 10x Fitter FW Lunge with B Pole A 1 Blue + 1 Black 15x Fitter BW Lunge with B Pole A 2 Black 10x 8" side step-up 10x 6" stepover 15x with single pole A               PT Short Term Goals - 02/17/15 0930    PT SHORT TERM GOAL #1   Title pt independent with initial HEP by 02/21/15   Status Achieved           PT Long Term Goals - 03/14/15 0813    PT LONG TERM GOAL #1   Title pt able to ambulate without need for AD over level and uneven terrain by 03/14/15   Status On-going   PT LONG TERM GOAL #2   Title pt able to ascend/descend stairs with reciprocal gait and no need for rail use by 03/14/15   Status On-going   PT LONG TERM GOAL #3   Title R Knee AROM 0-115 by 03/13/14   Status On-going   PT LONG TERM GOAL #4   Title pt able to perform squat with good  mechanics to parallel and no wt shift to L by 03/14/15   Status On-going               Plan - 03/14/15 0848    Clinical Impression Statement good progress with ROM goals, TRX squats were deep with minimal to no wt shifting   PT Next Visit Plan manual for ROM, TKE training, strengthening and gait training. Assess on stairs   Consulted and Agree with Plan of Care Patient        Problem List Patient Active Problem List   Diagnosis Date Noted  . Arthritis of knee, degenerative 01/24/2015  . Right knee pain 01/07/2014  . Concern about STD in male without diagnosis 01/06/2014  . Dysuria 12/09/2013  . High cholesterol 10/25/2013  . GERD (gastroesophageal reflux disease) 09/20/2013  . History of positive PPD 07/17/2012  . Cocaine abuse 04/02/2012  . Rhabdomyolysis 04/02/2012  . Hernia of abdominal wall 01/05/2011  . ERECTILE DYSFUNCTION, PSYCHOGENIC 02/13/2010  . BIPOLAR DISORDER UNSPECIFIED 01/31/2009  . TOBACCO ABUSE 01/31/2009  . ELEVATED BP READING WITHOUT DX HYPERTENSION 01/31/2009    Aris Even PT, OCS 03/14/2015, 8:50 AM  [facility] [place], [place], [address] Phone: [phone]   Fax:  [phone]

## 2015-03-15 ENCOUNTER — Ambulatory Visit: Payer: Medicare Other | Admitting: Physical Therapy

## 2015-03-15 DIAGNOSIS — M25561 Pain in right knee: Secondary | ICD-10-CM

## 2015-03-15 DIAGNOSIS — R262 Difficulty in walking, not elsewhere classified: Secondary | ICD-10-CM | POA: Diagnosis not present

## 2015-03-15 DIAGNOSIS — R269 Unspecified abnormalities of gait and mobility: Secondary | ICD-10-CM

## 2015-03-15 DIAGNOSIS — M25661 Stiffness of right knee, not elsewhere classified: Secondary | ICD-10-CM

## 2015-03-15 DIAGNOSIS — R29898 Other symptoms and signs involving the musculoskeletal system: Secondary | ICD-10-CM | POA: Diagnosis not present

## 2015-03-15 NOTE — Therapy (Addendum)
[facility] [address]  [place], [place], [address] Phone: [phone]   Fax:  [phone]  Physical Therapy Treatment  Patient Details  Name: Andrew [name] MRN: [id number] Date of Birth: August 02, 1966 Referring Provider:  Meredith Pel, MD  Encounter Date: 03/15/2015      PT End of Session - 03/15/15 0721    Visit Number 10   Number of Visits 12   Date for PT Re-Evaluation 03/22/15   PT Start Time 0715   PT Stop Time 0802   PT Time Calculation (min) 47 min      Past Medical History  Diagnosis Date  . Umbilical hernia   . Anxiety   . Depression   . Hypertension     takes HCTZ daily  . Hyperlipidemia     takes Zetia daily  . History of bronchitis     1 month ago and treated with Amoxicillin and given an inhaler;takes Prenisone  . Pneumonia     hx of 2 yrs ago  . Generalized headaches     history of migraines-last one 02/02/14  . Dizziness   . Arthritis     right knee  . Joint pain   . Joint swelling   . Back pain     occasionally  . GERD (gastroesophageal reflux disease)     takes Prilosec daily as needed  . Headache   . Polysubstance abuse   . COPD (chronic obstructive pulmonary disease)   . Tuberculosis     treated for 1 year  . Bipolar disorder     denies  . Schizoaffective disorder     denies  . Hemorrhoids     Past Surgical History  Procedure Laterality Date  . Lipoma excision  ~ 2010    "back left side of my head"  . Esophagogastroduodenoscopy    . Knee arthroscopy with medial menisectomy Right 03/01/2014    Procedure: KNEE ARTHROSCOPY WITH MEDIAL MENISECTOMY;  Surgeon: Meredith Pel, MD;  Location: [facility];  Service: Orthopedics;  Laterality: Right;  . Joint replacement    . Total knee arthroplasty Right   . Partial knee arthroplasty Right 01/24/2015    Procedure: RIGHT KNEE MEDIAL COMPARTMENT REPLACEMENT VS TOTAL KNEE ARTHROPLASTY.;  Surgeon: Meredith Pel, MD;   Location: [facility];  Service: Orthopedics;  Laterality: Right;  RIGHT KNEE MEDIAL COMPARTMENT REPLACEMENT VS TOTAL KNEE ARTHROPLASTY.    There were no vitals filed for this visit.  Visit Diagnosis:  Knee stiffness, right  Right leg weakness  Difficulty walking  Right knee pain  Abnormal gait      Subjective Assessment - 03/15/15 0721    Subjective States knee seemed to get more and more ROM throughout the day yesterday following PT session.  States while walking at mall seems to have to rest every several minutes due to R knee "feeling funny".  Pt states he feels he's ready to stop attending PT despite pain and lacking TKE in R knee.   Currently in Pain? Yes   Pain Score 7   AVG pain 5/10 and worst pain 8/10   Pain Location Knee   Pain Orientation Right   Pain Frequency Intermittent            [facility] PT Assessment - 03/15/15 0001    Observation/Other Assessments   Focus on Therapeutic Outcomes (FOTO)  27% limitation   AROM   Overall AROM Comments R Knee 10-112   PROM  Overall PROM Comments R Knee 2-117        TODAY'S TREATMENT: TherEx - Rec Bike lvl 2, 5' B FOB (55cm) tuck/bridge combo for knee ROM 20x Prone plank on elbows and toes with L leg lift 5x3" Prone Knee flexion stretch with cord 3x20" 8" step-up 15x 8" step-up with black TB for TKE 15x Stairs: up down 2 flights recip gait, no need for railing, good control Free Squats 10x Floor to knuckle 35# box lift 10x (good mechanics, no c/o increased pain) Single leg deadlift 10# 15x  Manual - Pone Knee flexion contract/relax, R Knee Grade 4 extension mobes (very well tolerated without c/o pain today)                     PT Education - 04-02-15 0801    Education provided Yes   Education Details HEP update   Person(s) Educated Patient   Methods Explanation;Demonstration;Handout   Comprehension Returned demonstration;Verbalized understanding          PT Short Term Goals - 02/17/15 0930     PT SHORT TERM GOAL #1   Title pt independent with initial HEP by 02/21/15   Status Achieved           PT Long Term Goals - 04/02/15 0807    PT LONG TERM GOAL #1   Title pt able to ambulate without need for AD over level and uneven terrain by 03/14/15   Status Achieved   PT LONG TERM GOAL #2   Title pt able to ascend/descend stairs with reciprocal gait and no need for rail use by 03/14/15   Status Achieved   PT LONG TERM GOAL #3   Title R Knee AROM 0-115 by 03/13/14  PROM 2-117, OKC AROM TKE lacking 10 but CKC able to achieve within 5 degrees, AROM FLexion 112   Status Not Met   PT LONG TERM GOAL #4   Title pt able to perform squat with good mechanics to parallel and no wt shift to L by 03/14/15   Status Achieved               Plan - 04-02-2015 0811    Clinical Impression Statement pt with good level of function (squats to parallel with and without additional wt with good mechanics, able to ascend/descend stairs with good control without need for railing or AD with reciprocal gait).  He continues to note AM stiffness and reports rather high levels of pain to medial and lateral aspect of R knee (rating 5/10 on AVG and up to 8/10) but this doesn't seem to limit his level of function.  He states he feels as though he's had enough PT and can take of knee himself going forward.  Excellent pt and very highly motivated so will likely progress without problems going forward; however, given his reported levels of pain I will keep pt on hold and not discharge him for 2-4 weeks.   PT Next Visit Plan pt on hold from PT for 2-4 weeks, if he does not call to return we will discharge by end of June   Consulted and Agree with Plan of Care Patient          G-Codes - 2015-04-02 0816    Functional Assessment Tool Used FOTO 27% limitation   Functional Limitation Mobility: Walking and moving around   Mobility: Walking and Moving Around Current Status ([id number]) At least 20 percent but less than 40 percent  impaired, limited or restricted  Mobility: Walking and Moving Around Goal Status [phone]) At least 20 percent but less than 40 percent impaired, limited or restricted   Mobility: Walking and Moving Around Discharge Status [phone]) At least 20 percent but less than 40 percent impaired, limited or restricted      Problem List Patient Active Problem List   Diagnosis Date Noted  . Arthritis of knee, degenerative 01/24/2015  . Right knee pain 01/07/2014  . Concern about STD in male without diagnosis 01/06/2014  . Dysuria 12/09/2013  . High cholesterol 10/25/2013  . GERD (gastroesophageal reflux disease) 09/20/2013  . History of positive PPD 07/17/2012  . Cocaine abuse 04/02/2012  . Rhabdomyolysis 04/02/2012  . Hernia of abdominal wall 01/05/2011  . ERECTILE DYSFUNCTION, PSYCHOGENIC 02/13/2010  . BIPOLAR DISORDER UNSPECIFIED 01/31/2009  . TOBACCO ABUSE 01/31/2009  . ELEVATED BP READING WITHOUT DX HYPERTENSION 01/31/2009    Marris Frontera PT, OCS 03/15/2015, 9:44 AM  [facility] [address]  [place], [place], [address] Phone: [phone]   Fax:  [phone]     PHYSICAL THERAPY DISCHARGE SUMMARY  Visits from Start of Care: 10  Current functional level related to goals / functional outcomes: All goals other than ROM met   Remaining deficits: Lacking 10 degrees TKE at last visit   Education / Equipment: HEP Plan: Patient agrees to discharge.  Patient goals were partially met. Patient is being discharged due to being pleased with the current functional level.  ?????       Leonette Most PT, OCS 04/06/2015 2:32 PM

## 2015-03-17 ENCOUNTER — Ambulatory Visit: Payer: Medicare Other | Admitting: Physical Therapy

## 2015-03-29 DIAGNOSIS — M545 Low back pain: Secondary | ICD-10-CM | POA: Diagnosis not present

## 2015-03-29 DIAGNOSIS — M1711 Unilateral primary osteoarthritis, right knee: Secondary | ICD-10-CM | POA: Diagnosis not present

## 2015-03-29 DIAGNOSIS — Z96651 Presence of right artificial knee joint: Secondary | ICD-10-CM | POA: Diagnosis not present

## 2015-04-03 DIAGNOSIS — F319 Bipolar disorder, unspecified: Secondary | ICD-10-CM | POA: Diagnosis not present

## 2015-04-07 DIAGNOSIS — M47816 Spondylosis without myelopathy or radiculopathy, lumbar region: Secondary | ICD-10-CM | POA: Diagnosis not present

## 2015-04-07 DIAGNOSIS — M545 Low back pain: Secondary | ICD-10-CM | POA: Diagnosis not present

## 2015-04-07 DIAGNOSIS — M5126 Other intervertebral disc displacement, lumbar region: Secondary | ICD-10-CM | POA: Diagnosis not present

## 2015-04-24 DIAGNOSIS — M5116 Intervertebral disc disorders with radiculopathy, lumbar region: Secondary | ICD-10-CM | POA: Diagnosis not present

## 2015-05-04 ENCOUNTER — Other Ambulatory Visit (HOSPITAL_COMMUNITY)
Admission: RE | Admit: 2015-05-04 | Discharge: 2015-05-04 | Disposition: A | Discharge status: Home / Self Care | Payer: Medicare Other | Source: Ambulatory Visit | Attending: Family Medicine | Admitting: Family Medicine

## 2015-05-04 DIAGNOSIS — Z113 Encounter for screening for infections with a predominantly sexual mode of transmission: Secondary | ICD-10-CM | POA: Insufficient documentation

## 2015-05-04 DIAGNOSIS — A5909 Other urogenital trichomoniasis: Secondary | ICD-10-CM | POA: Diagnosis not present

## 2015-05-05 ENCOUNTER — Other Ambulatory Visit: Payer: Medicare Other | Admitting: Family Medicine

## 2015-05-10 LAB — URINE CYTOLOGY ANCILLARY ONLY
Bacterial vaginitis: NEGATIVE
CHLAMYDIA, DNA PROBE: NEGATIVE
Candida vaginitis: NEGATIVE
NEISSERIA GONORRHEA: NEGATIVE
Trichomonas: NEGATIVE

## 2015-05-15 ENCOUNTER — Ambulatory Visit: Payer: Medicare Other | Attending: Orthopedic Surgery | Admitting: Rehabilitation

## 2015-05-15 ENCOUNTER — Encounter: Payer: Self-pay | Admitting: Rehabilitation

## 2015-05-15 DIAGNOSIS — M545 Low back pain, unspecified: Secondary | ICD-10-CM

## 2015-05-15 NOTE — Therapy (Addendum)
[facility] [address]  [place], [place], [address] Phone: [phone]   Fax:  [phone]  Physical Therapy Evaluation  Patient Details  Name: Andrew [name] MRN: [id number] Date of Birth: 10-09-1965 Referring Provider:  Meredith Pel, MD  Encounter Date: 05/15/2015      PT End of Session - 05/15/15 1154    Visit Number 1   Number of Visits 12   Date for PT Re-Evaluation 06/26/15   PT Start Time 0800   PT Stop Time 0845   PT Time Calculation (min) 45 min   Equipment Utilized During Treatment Gait belt   Activity Tolerance Patient tolerated treatment well      Past Medical History  Diagnosis Date  . Umbilical hernia   . Anxiety   . Depression   . Hypertension     takes HCTZ daily  . Hyperlipidemia     takes Zetia daily  . History of bronchitis     1 month ago and treated with Amoxicillin and given an inhaler;takes Prenisone  . Pneumonia     hx of 2 yrs ago  . Generalized headaches     history of migraines-last one 02/02/14  . Dizziness   . Arthritis     right knee  . Joint pain   . Joint swelling   . Back pain     occasionally  . GERD (gastroesophageal reflux disease)     takes Prilosec daily as needed  . Headache   . Polysubstance abuse   . COPD (chronic obstructive pulmonary disease)   . Tuberculosis     treated for 1 year  . Bipolar disorder     denies  . Schizoaffective disorder     denies  . Hemorrhoids     Past Surgical History  Procedure Laterality Date  . Lipoma excision  ~ 2010    "back left side of my head"  . Esophagogastroduodenoscopy    . Knee arthroscopy with medial menisectomy Right 03/01/2014    Procedure: KNEE ARTHROSCOPY WITH MEDIAL MENISECTOMY;  Surgeon: Meredith Pel, MD;  Location: [facility];  Service: Orthopedics;  Laterality: Right;  . Joint replacement    . Total knee arthroplasty Right   . Partial knee arthroplasty Right 01/24/2015    Procedure: RIGHT  KNEE MEDIAL COMPARTMENT REPLACEMENT VS TOTAL KNEE ARTHROPLASTY.;  Surgeon: Meredith Pel, MD;  Location: [place];  Service: Orthopedics;  Laterality: Right;  RIGHT KNEE MEDIAL COMPARTMENT REPLACEMENT VS TOTAL KNEE ARTHROPLASTY.    There were no vitals filed for this visit.  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  started noticing the back was hurting when coming off medication for that incident. happened August 15 2014.  Still taking percocet for the knee  which does take the back pain away.  started wearing a back brace when he went to the MD about a month ago.  wearing it mainly when the back is hurting. Not really lifting or bending much at work much .  Does report alot of driving .  The pain increases lately with sitting too much.  Improves with movement.  Denies leg pain or numbness or tingling.    How long can you sit comfortably? 75minutes   How  long can you stand comfortably? no limitations   How long can you walk comfortably? no limtations   Diagnostic tests xray   Patient Stated Goals decrease back pain   Currently in Pain? Yes   Pain Score 6    Pain Location Back   Pain Orientation Mid  around L2   Pain Type Chronic pain   Pain Onset More than a month ago   Pain Frequency Intermittent   Aggravating Factors  sitting more than 42minutes   Pain Relieving Factors massage, brace, ice, percocet   Effect of Pain on Daily Activities decreasing movements            [facility] PT Assessment - 05/15/15 0001    Assessment   Medical Diagnosis low back pain   Onset Date/Surgical Date 08/15/14   Prior Therapy no   Precautions   Precautions None   Balance Screen   Has the patient fallen in the past 6 months No   [place] residence   Prior  Function   Level of Independence Independent   Vocation Full time employment   Vocation Requirements tree trimmer part-time   Observation/Other Assessments   Focus on Therapeutic Outcomes (FOTO)  44% limited   AROM   Overall AROM Comments most pain located R lateral trunk/QL region not midline during ROM   AROM Assessment Site Lumbar   Lumbar Flexion fingers 6"from floor with pain   Lumbar Extension full   Lumbar - Right Side Bend full with pull   Lumbar - Left Side Bend full with pull   Lumbar - Right Rotation full with pull   Lumbar - Left Rotation full with pull   PROM   Overall PROM Comments decreased flexibility with pain during SKTC, KTOS, piriformis stretched positions R only   Strength   Overall Strength Comments not tested today   Flexibility   Soft Tissue Assessment /Muscle Length yes   Hamstrings to 90 bil   Palpation   Palpation comment +2ttp L QL lateral back and +3 R QL lateral trunk   Special Tests    Special Tests Lumbar   Lumbar Tests Straight Leg Raise   Straight Leg Raise   Findings Negative   Side  Right   Comment and Lt   Bed Mobility   Bed Mobility --   Transfers   Comments performing supine to sit painfully; better with education on log roll   Ambulation/Gait   Gait Comments WNL       Eval performed HEP education and performance Declines modalities                    PT Education - 05/15/15 1154    Education provided Yes   Education Details diagnosis, POC, HEP   Person(s) Educated Patient   Methods Explanation;Demonstration;Handout   Comprehension Verbalized understanding;Returned demonstration          PT Short Term Goals - 05/15/15 1200    PT SHORT TERM GOAL #1   Title pt independent with initial HEP by 05/22/15   Status New           PT Long Term Goals - 05/15/15 1200    PT LONG TERM GOAL #1   Title pt will tolerate sitting x 1 hour without increased back pain by 9/19   Status New   PT LONG TERM GOAL #2    Title pt will improve lumbar AROM to painfree on the R by 9/19  Time 6   Period Weeks   Status New   PT LONG TERM GOAL #3   Title pt will tolerate core strengthing and be independent with final HEP by 9/19               Plan - 02-Jun-2015 1155    Clinical Impression Statement Pt presents with low back pain that he attributes to bulging discs in his lumbar spine but may be more related to R sided musculature following R knee surgery with gait abnormalities.  Pt very tender in R QL region and most pain noted in AROM as a pull in that location in stretched positions. He was educated on lumbar disc bulging, imaging vs not at this time, and role of PT with back pain as patient is skeptical and wondering why he has not received an MRI.  Seems okay with plan at end of session.     Pt will benefit from skilled therapeutic intervention in order to improve on the following deficits Decreased mobility;Pain;Improper body mechanics   Rehab Potential Good   PT Frequency 2x / week   PT Duration 6 weeks   PT Treatment/Interventions Electrical Stimulation;Moist Heat;Traction;Therapeutic activities;Therapeutic exercise;Neuromuscular re-education;Manual techniques;Patient/family education;Taping;Dry needling   PT Next Visit Plan R sided LBP ; STM, lumbar / hip stretching combined with extension TE for possible disc aggravation, modalities, body mechanics   Consulted and Agree with Plan of Care Patient          G-Codes - 02-Jun-2015 January 22, 1201    Functional Assessment Tool Used FOTO 44% limitations   Functional Limitation Mobility: Walking and moving around   Mobility: Walking and Moving Around Current Status [phone]) At least 40 percent but less than 60 percent impaired, limited or restricted   Mobility: Walking and Moving Around Goal Status [phone]) At least 20 percent but less than 40 percent impaired, limited or restricted       Problem List Patient Active Problem List   Diagnosis Date Noted  .  Arthritis of knee, degenerative 01/24/2015  . Right knee pain 01/07/2014  . Concern about STD in male without diagnosis 01/06/2014  . Dysuria 12/09/2013  . High cholesterol 10/25/2013  . GERD (gastroesophageal reflux disease) 09/20/2013  . History of positive PPD 07/17/2012  . Cocaine abuse 04/02/2012  . Rhabdomyolysis 04/02/2012  . Hernia of abdominal wall 2011/01/23  . ERECTILE DYSFUNCTION, PSYCHOGENIC 02/13/2010  . BIPOLAR DISORDER UNSPECIFIED 01/31/2009  . TOBACCO ABUSE 01/31/2009  . ELEVATED BP READING WITHOUT DX HYPERTENSION 01/31/2009    Stark Bray, DPT, CMP 02-Jun-2015, 12:04 PM  [facility] [address]  [address] [place], [place], [address] Phone: [phone]   Fax:  [phone]

## 2015-05-15 NOTE — Patient Instructions (Signed)
HEP given from [place] 2x20"bil , DKTC 2x20" , Right KTOS 3x20" , Hamstring stretch bil 3x20" bil, LTR x 10 bil,  Intermittent standing lumbar extension throughout the day

## 2015-05-23 ENCOUNTER — Ambulatory Visit: Payer: Medicare Other | Admitting: Physical Therapy

## 2015-05-23 DIAGNOSIS — M545 Low back pain, unspecified: Secondary | ICD-10-CM

## 2015-05-23 NOTE — Therapy (Signed)
[facility] [address]  [place], [place], [address] Phone: [phone]   Fax:  [phone]  Physical Therapy Treatment  Patient Details  Name: Andrew [name] MRN: [id number] Date of Birth: Sep 08, 1966 Referring Provider:  Meredith Pel, MD  Encounter Date: 05/23/2015      PT End of Session - 05/23/15 0954    Visit Number 2   Number of Visits 12   Date for PT Re-Evaluation 06/26/15   PT Start Time 0845   PT Stop Time 0952   PT Time Calculation (min) 67 min   Activity Tolerance Patient tolerated treatment well   Behavior During Therapy [facility] for tasks assessed/performed      Past Medical History  Diagnosis Date  . Umbilical hernia   . Anxiety   . Depression   . Hypertension     takes HCTZ daily  . Hyperlipidemia     takes Zetia daily  . History of bronchitis     1 month ago and treated with Amoxicillin and given an inhaler;takes Prenisone  . Pneumonia     hx of 2 yrs ago  . Generalized headaches     history of migraines-last one 02/02/14  . Dizziness   . Arthritis     right knee  . Joint pain   . Joint swelling   . Back pain     occasionally  . GERD (gastroesophageal reflux disease)     takes Prilosec daily as needed  . Headache   . Polysubstance abuse   . COPD (chronic obstructive pulmonary disease)   . Tuberculosis     treated for 1 year  . Bipolar disorder     denies  . Schizoaffective disorder     denies  . Hemorrhoids     Past Surgical History  Procedure Laterality Date  . Lipoma excision  ~ 2010    "back left side of my head"  . Esophagogastroduodenoscopy    . Knee arthroscopy with medial menisectomy Right 03/01/2014    Procedure: KNEE ARTHROSCOPY WITH MEDIAL MENISECTOMY;  Surgeon: Meredith Pel, MD;  Location: [facility];  Service: Orthopedics;  Laterality: Right;  . Joint replacement    . Total knee arthroplasty Right   . Partial knee arthroplasty Right 01/24/2015   Procedure: RIGHT KNEE MEDIAL COMPARTMENT REPLACEMENT VS TOTAL KNEE ARTHROPLASTY.;  Surgeon: Meredith Pel, MD;  Location: [facility];  Service: Orthopedics;  Laterality: Right;  RIGHT KNEE MEDIAL COMPARTMENT REPLACEMENT VS TOTAL KNEE ARTHROPLASTY.    There were no vitals filed for this visit.  Visit Diagnosis:  Midline low back pain without sciatica      Subjective Assessment - 05/23/15 0851    Subjective Patient reports he's dealing with the pain, using back brace and pain meds. Pain usually worst first thing upon rising in the morning. Reports completing HEP 1x every other day using handout pictures as a guide. States typically only holding stretches for a 10 sec count.   Currently in Pain? Yes   Pain Score 6   Least 4/10, Avg 8/10, Worst 10/10   Pain Location Back   Pain Orientation Mid;Lower   Pain Descriptors / Indicators Sharp          Today's Treatment  Ther Ex Rec Bike x5' HEP review: SKTC 3x20" bil   DKTC deferred due to increased pain KTOS 3x20" bil Supine Hamstring stretch with strap bil 3x20" bil LTR x 10 bil Intermittent standing lumbar extension throughout the  day   Abdominal bracing 10x5" Bridging + TrA x10 Supine marching + TrA x10  TherAct Patient trained in logrolling and sidelying to sit to reduce trunk rotation and pain during supine to/from sit transitions  Traction  Mechanical Traction - Lumbar spine, supine, neutral pull, 50#/25#, 60"/20", 15'              PT Short Term Goals - 05/23/15 1311    PT SHORT TERM GOAL #1   Title pt independent with initial HEP by 05/22/15   Status On-going           PT Long Term Goals - 05/23/15 1311    PT LONG TERM GOAL #1   Title pt will tolerate sitting x 1 hour without increased back pain by 9/19   Status On-going   PT LONG TERM GOAL #2   Title pt will improve lumbar AROM to painfree on the R by 9/19   Status On-going   PT LONG TERM GOAL #3   Title pt will tolerate core strengthing and be  independent with final HEP by 9/19   Status On-going   PT LONG TERM GOAL #4   Title pt able to perform squat with good mechanics to parallel and no wt shift to L by 03/14/15   Status On-going               Plan - 05/23/15 1305    Clinical Impression Statement Patient requiring significant correction of technique for HEP stretches and easily distracted requiring redirection to complete task. Initiated core activation/stabilization exercises but limited opportunity to progress to extension exercises secondary to time spent on HEP review. Traction initiated in supine with good initial tolerance. Will plan to progess to prone position on next visit.   PT Next Visit Plan R sided LBP ; STM, lumbar / hip stretching combined with extension TE for possible disc aggravation, modalities, body mechanics, traction   Consulted and Agree with Plan of Care Patient        Problem List Patient Active Problem List   Diagnosis Date Noted  . Arthritis of knee, degenerative 01/24/2015  . Right knee pain 01/07/2014  . Concern about STD in male without diagnosis 01/06/2014  . Dysuria 12/09/2013  . High cholesterol 10/25/2013  . GERD (gastroesophageal reflux disease) 09/20/2013  . History of positive PPD 07/17/2012  . Cocaine abuse 04/02/2012  . Rhabdomyolysis 04/02/2012  . Hernia of abdominal wall 01/05/2011  . ERECTILE DYSFUNCTION, PSYCHOGENIC 02/13/2010  . BIPOLAR DISORDER UNSPECIFIED 01/31/2009  . TOBACCO ABUSE 01/31/2009  . ELEVATED BP READING WITHOUT DX HYPERTENSION 01/31/2009    Percival Spanish, PT, MPT 05/23/2015, 1:15 PM  [facility] [address]  [address] [place], [place], [address] Phone: [phone]   Fax:  [phone]

## 2015-05-26 ENCOUNTER — Ambulatory Visit: Payer: Medicare Other | Admitting: Physical Therapy

## 2015-05-26 DIAGNOSIS — M545 Low back pain, unspecified: Secondary | ICD-10-CM

## 2015-05-26 NOTE — Therapy (Signed)
[facility] [address]  [place], [place], [address] Phone: [phone]   Fax:  [phone]  Physical Therapy Treatment  Patient Details  Name: Andrew [name] MRN: [id number] Date of Birth: Oct 28, 1965 Referring Provider:  Meredith Pel, MD  Encounter Date: 05/26/2015      PT End of Session - 05/26/15 0938    Visit Number 3   Number of Visits 12   Date for PT Re-Evaluation 06/26/15   PT Start Time 0932      Past Medical History  Diagnosis Date  . Umbilical hernia   . Anxiety   . Depression   . Hypertension     takes HCTZ daily  . Hyperlipidemia     takes Zetia daily  . History of bronchitis     1 month ago and treated with Amoxicillin and given an inhaler;takes Prenisone  . Pneumonia     hx of 2 yrs ago  . Generalized headaches     history of migraines-last one 02/02/14  . Dizziness   . Arthritis     right knee  . Joint pain   . Joint swelling   . Back pain     occasionally  . GERD (gastroesophageal reflux disease)     takes Prilosec daily as needed  . Headache   . Polysubstance abuse   . COPD (chronic obstructive pulmonary disease)   . Tuberculosis     treated for 1 year  . Bipolar disorder     denies  . Schizoaffective disorder     denies  . Hemorrhoids     Past Surgical History  Procedure Laterality Date  . Lipoma excision  ~ 2010    "back left side of my head"  . Esophagogastroduodenoscopy    . Knee arthroscopy with medial menisectomy Right 03/01/2014    Procedure: KNEE ARTHROSCOPY WITH MEDIAL MENISECTOMY;  Surgeon: Meredith Pel, MD;  Location: [facility];  Service: Orthopedics;  Laterality: Right;  . Joint replacement    . Total knee arthroplasty Right   . Partial knee arthroplasty Right 01/24/2015    Procedure: RIGHT KNEE MEDIAL COMPARTMENT REPLACEMENT VS TOTAL KNEE ARTHROPLASTY.;  Surgeon: Meredith Pel, MD;  Location: [facility];  Service: Orthopedics;  Laterality: Right;   RIGHT KNEE MEDIAL COMPARTMENT REPLACEMENT VS TOTAL KNEE ARTHROPLASTY.    There were no vitals filed for this visit.  Visit Diagnosis:  No diagnosis found.      Subjective Assessment - 05/26/15 0935    Subjective Patient reports no better/no worse after last visit. Admits to not having done any of HEP since last visit secondary to sick.   Currently in Pain? Yes   Pain Score 6    Pain Location Back   Pain Orientation Mid;Lateral   Pain Descriptors / Indicators Sharp           Today's Treatment  Ther Ex Rec Bike lvl 1 x5' SKTC 3x20" bil  DKTC 3x20" KTOS 3x20" bil Supine Hamstring stretch with strap bil 3x20" bil FOB (orange - 55cm) LTR 10x3" bil Abdominal bracing 10x5" FOB (orange - 55 cm) Bridging + TrA 10x3" Supine marching + TrA 10x3" Dead bug + TrA 10x3" Seated child's pose L/C/R on orange (55cm) Tball 3x20"  TherAct Reviewed logrolling and sidelying to sit to reduce trunk rotation and pain during supine to/from sit transitions  Traction  Mechanical Traction - Lumbar spine, prone, neutral pull, 60#/30#, 60"/20", 15'  PT Short Term Goals - 05/23/15 1311    PT SHORT TERM GOAL #1   Title pt independent with initial HEP by 05/22/15   Status On-going           PT Long Term Goals - 05/23/15 1311    PT LONG TERM GOAL #1   Title pt will tolerate sitting x 1 hour without increased back pain by 9/19   Status On-going   PT LONG TERM GOAL #2   Title pt will improve lumbar AROM to painfree on the R by 9/19   Status On-going   PT LONG TERM GOAL #3   Title pt will tolerate core strengthing and be independent with final HEP by 9/19   Status On-going   PT LONG TERM GOAL #4   Title pt able to perform squat with good mechanics to parallel and no wt shift to L by 03/14/15   Status On-going               Problem List Patient Active Problem List   Diagnosis Date Noted  . Arthritis of knee, degenerative 01/24/2015  . Right knee pain  01/07/2014  . Concern about STD in male without diagnosis 01/06/2014  . Dysuria 12/09/2013  . High cholesterol 10/25/2013  . GERD (gastroesophageal reflux disease) 09/20/2013  . History of positive PPD 07/17/2012  . Cocaine abuse 04/02/2012  . Rhabdomyolysis 04/02/2012  . Hernia of abdominal wall 01/05/2011  . ERECTILE DYSFUNCTION, PSYCHOGENIC 02/13/2010  . BIPOLAR DISORDER UNSPECIFIED 01/31/2009  . TOBACCO ABUSE 01/31/2009  . ELEVATED BP READING WITHOUT DX HYPERTENSION 01/31/2009    Percival Spanish, PT, MPT 05/26/2015, 9:41 AM  [facility] [place], [place], [address] Phone: [phone]   Fax:  [phone]

## 2015-05-30 ENCOUNTER — Ambulatory Visit: Payer: Medicare Other | Admitting: Physical Therapy

## 2015-05-30 DIAGNOSIS — M545 Low back pain, unspecified: Secondary | ICD-10-CM

## 2015-05-30 NOTE — Therapy (Signed)
[facility] [address]  [place], [place], [address] Phone: [phone]   Fax:  [phone]  Physical Therapy Treatment  Patient Details  Name: Andrew [name] MRN: [id number] Date of Birth: 1966-04-30 Referring Provider:  Meredith Pel, MD  Encounter Date: 05/30/2015      PT End of Session - 05/30/15 0804    Visit Number 4   Number of Visits 12   Date for PT Re-Evaluation 06/26/15   PT Start Time 0800   PT Stop Time 0901   PT Time Calculation (min) 61 min   Activity Tolerance Patient tolerated treatment well   Behavior During Therapy [facility] for tasks assessed/performed      Past Medical History  Diagnosis Date  . Umbilical hernia   . Anxiety   . Depression   . Hypertension     takes HCTZ daily  . Hyperlipidemia     takes Zetia daily  . History of bronchitis     1 month ago and treated with Amoxicillin and given an inhaler;takes Prenisone  . Pneumonia     hx of 2 yrs ago  . Generalized headaches     history of migraines-last one 02/02/14  . Dizziness   . Arthritis     right knee  . Joint pain   . Joint swelling   . Back pain     occasionally  . GERD (gastroesophageal reflux disease)     takes Prilosec daily as needed  . Headache   . Polysubstance abuse   . COPD (chronic obstructive pulmonary disease)   . Tuberculosis     treated for 1 year  . Bipolar disorder     denies  . Schizoaffective disorder     denies  . Hemorrhoids     Past Surgical History  Procedure Laterality Date  . Lipoma excision  ~ 2010    "back left side of my head"  . Esophagogastroduodenoscopy    . Knee arthroscopy with medial menisectomy Right 03/01/2014    Procedure: KNEE ARTHROSCOPY WITH MEDIAL MENISECTOMY;  Surgeon: Meredith Pel, MD;  Location: [facility];  Service: Orthopedics;  Laterality: Right;  . Joint replacement    . Total knee arthroplasty Right   . Partial knee arthroplasty Right 01/24/2015   Procedure: RIGHT KNEE MEDIAL COMPARTMENT REPLACEMENT VS TOTAL KNEE ARTHROPLASTY.;  Surgeon: Meredith Pel, MD;  Location: [facility];  Service: Orthopedics;  Laterality: Right;  RIGHT KNEE MEDIAL COMPARTMENT REPLACEMENT VS TOTAL KNEE ARTHROPLASTY.    There were no vitals filed for this visit.  Visit Diagnosis:  Midline low back pain without sciatica      Subjective Assessment - 05/30/15 0803    Subjective Patient reports he "has been resting", letting the others at work do the heavy work. Reports clicking in right knee when walking.   Currently in Pain? No/denies            Today's Treatment  Ther Ex Rec Bike lvl 1 x5' SKTC 3x20" bil, manually  DKTC 3x20", manually KTOS 3x20" bil, manually Supine Hamstring stretch 3x20" bil, manually FOB (orange - 55cm) LTR 10x3" bil Abdominal bracing 10x5" FOB (orange - 55 cm) Bridging + TrA 10x3" Seated child's pose L/C/R on orange (55cm) Tball 3x20" POE 2x1' Prone press-up 3x10" Cybex Low Row 25#x10, 35#x10   Traction  Mechanical Traction - Lumbar spine, prone, neutral pull, 70#/35#, 60"/20", 15'             PT  Short Term Goals - 05/30/15 0846    PT SHORT TERM GOAL #1   Title pt independent with initial HEP by 05/22/15   Status On-going           PT Long Term Goals - 05/30/15 0846    PT LONG TERM GOAL #1   Title pt will tolerate sitting x 1 hour without increased back pain by 9/19   Status On-going   PT LONG TERM GOAL #2   Title pt will improve lumbar AROM to painfree on the R by 9/19   Status On-going   PT LONG TERM GOAL #3   Title pt will tolerate core strengthing and be independent with final HEP by 9/19   Status On-going               Plan - 05/30/15 9432    Clinical Impression Statement Patient c/o pain/clicking in right distal lateral hamstring during manual stretching, but no other c/o pain during exercises/stretching. Able to demonstrate and maintain good neutral spine alignment during upper  body low row.   PT Next Visit Plan R sided LBP ; STM, lumbar / hip stretching combined with extension TE for possible disc aggravation, modalities, body mechanics, traction   Consulted and Agree with Plan of Care Patient        Problem List Patient Active Problem List   Diagnosis Date Noted  . Arthritis of knee, degenerative 01/24/2015  . Right knee pain 01/07/2014  . Concern about STD in male without diagnosis 01/06/2014  . Dysuria 12/09/2013  . High cholesterol 10/25/2013  . GERD (gastroesophageal reflux disease) 09/20/2013  . History of positive PPD 07/17/2012  . Cocaine abuse 04/02/2012  . Rhabdomyolysis 04/02/2012  . Hernia of abdominal wall 01/05/2011  . ERECTILE DYSFUNCTION, PSYCHOGENIC 02/13/2010  . BIPOLAR DISORDER UNSPECIFIED 01/31/2009  . TOBACCO ABUSE 01/31/2009  . ELEVATED BP READING WITHOUT DX HYPERTENSION 01/31/2009    Percival Spanish, PT, MPT 05/30/2015, 9:03 AM  [facility] [place], [place], [address] Phone: [phone]   Fax:  [phone]

## 2015-06-02 ENCOUNTER — Ambulatory Visit: Payer: Medicare Other | Admitting: Rehabilitation

## 2015-06-07 ENCOUNTER — Ambulatory Visit: Payer: Medicare Other | Admitting: Physical Therapy

## 2015-06-07 DIAGNOSIS — M545 Low back pain, unspecified: Secondary | ICD-10-CM

## 2015-06-07 NOTE — Therapy (Addendum)
[facility] [address]  [place], [place], [address] Phone: [phone]   Fax:  [phone]  Physical Therapy Treatment  Patient Details  Name: Andrew [name] MRN: [id number] Date of Birth: 01-14-1966 Referring Provider:  Meredith Pel, MD  Encounter Date: 06/07/2015      PT End of Session - 06/07/15 0807    Visit Number 5   Number of Visits 12   Date for PT Re-Evaluation 06/26/15   PT Start Time 0801   PT Stop Time 0842   PT Time Calculation (min) 41 min   Activity Tolerance Patient tolerated treatment well   Behavior During Therapy [facility] for tasks assessed/performed      Past Medical History  Diagnosis Date  . Umbilical hernia   . Anxiety   . Depression   . Hypertension     takes HCTZ daily  . Hyperlipidemia     takes Zetia daily  . History of bronchitis     1 month ago and treated with Amoxicillin and given an inhaler;takes Prenisone  . Pneumonia     hx of 2 yrs ago  . Generalized headaches     history of migraines-last one 02/02/14  . Dizziness   . Arthritis     right knee  . Joint pain   . Joint swelling   . Back pain     occasionally  . GERD (gastroesophageal reflux disease)     takes Prilosec daily as needed  . Headache   . Polysubstance abuse   . COPD (chronic obstructive pulmonary disease)   . Tuberculosis     treated for 1 year  . Bipolar disorder     denies  . Schizoaffective disorder     denies  . Hemorrhoids     Past Surgical History  Procedure Laterality Date  . Lipoma excision  ~ 2010    "back left side of my head"  . Esophagogastroduodenoscopy    . Knee arthroscopy with medial menisectomy Right 03/01/2014    Procedure: KNEE ARTHROSCOPY WITH MEDIAL MENISECTOMY;  Surgeon: Meredith Pel, MD;  Location: [place];  Service: Orthopedics;  Laterality: Right;  . Joint replacement    . Total knee arthroplasty Right   . Partial knee arthroplasty Right 01/24/2015   Procedure: RIGHT KNEE MEDIAL COMPARTMENT REPLACEMENT VS TOTAL KNEE ARTHROPLASTY.;  Surgeon: Meredith Pel, MD;  Location: [place];  Service: Orthopedics;  Laterality: Right;  RIGHT KNEE MEDIAL COMPARTMENT REPLACEMENT VS TOTAL KNEE ARTHROPLASTY.    There were no vitals filed for this visit.  Visit Diagnosis:  Midline low back pain without sciatica      Subjective Assessment - 06/07/15 0801    Subjective Patient reports no better... still 6/10 all the time. Admits to cutting and loading 3 loads of wood yesterday.   Currently in Pain? Yes   Pain Score 6    Pain Location Back   Pain Orientation Mid;Lower           Today's Treatment  Ther Ex Rec Bike lvl 1 x5' SKTC 3x20" bil, manually  DKTC 3x20", manually KTOS 3x20" bil, manually Supine Hamstring stretch 3x20" bil, manually FOB (green - 65cm) LTR 10x3" bil Abdominal bracing 10x5" FOB (green - 65 cm) Bridging + TrA 10x5" Seated child's pose L/C/R on green (65cm) Tball 3x20" POE 2x2' Prone press-up 3x15" Cybex Low Row 35# x10, 45# x10 TRX Row x15  Manual Tx STM to lumbar paraspinals, focus on left side  PT Short Term Goals - 06/07/15 0842    PT SHORT TERM GOAL #1   Title pt independent with initial HEP by 05/22/15   Status Achieved           PT Long Term Goals - 06/07/15 0842    PT LONG TERM GOAL #1   Title pt will tolerate sitting x 1 hour without increased back pain by 9/19   Status On-going   PT LONG TERM GOAL #2   Title pt will improve lumbar AROM to painfree on the R by 9/19   Status On-going   PT LONG TERM GOAL #3   Title pt will tolerate core strengthing and be independent with final HEP by 9/19   Status On-going   PT LONG TERM GOAL #4   Title pt able to perform squat with good mechanics to parallel and no wt shift to L by 03/14/15   Status On-going               Plan - 06/07/15 0842    Clinical Impression Statement Cautioned patient against heavy lifting when cutting  and moving wood, with patient stating he tries to roll wood more than lift. Reinforced neutral spine during exercises and encouraged awarness of neutral spine with good lifting mechanics and avoiding bending in back, using squat to reach down.    PT Next Visit Plan R sided LBP ; STM, lumbar / hip stretching combined with extension TE for possible disc aggravation, modalities, body mechanics, traction   Consulted and Agree with Plan of Care Patient        Problem List Patient Active Problem List   Diagnosis Date Noted  . Arthritis of knee, degenerative 01/24/2015  . Right knee pain 01/07/2014  . Concern about STD in male without diagnosis 01/06/2014  . Dysuria 12/09/2013  . High cholesterol 10/25/2013  . GERD (gastroesophageal reflux disease) 09/20/2013  . History of positive PPD 07/17/2012  . Cocaine abuse 04/02/2012  . Rhabdomyolysis 04/02/2012  . Hernia of abdominal wall 01/05/2011  . ERECTILE DYSFUNCTION, PSYCHOGENIC 02/13/2010  . BIPOLAR DISORDER UNSPECIFIED 01/31/2009  . TOBACCO ABUSE 01/31/2009  . ELEVATED BP READING WITHOUT DX HYPERTENSION 01/31/2009    Percival Spanish 06/07/2015, 8:49 AM  [facility] [address]  [place], [place], [address] Phone: [phone]   Fax:  [phone]    PHYSICAL THERAPY DISCHARGE SUMMARY  Visits from Start of Care: 5  Current functional level related to goals / functional outcomes:  Unable to assess secondary to multiple cancellations (6) and no shows (2) since last seen on 06/07/15. Will proceed with discharge from PT secondary to no return x >30 days.   Remaining deficits:  Unknown   Education / Equipment:  HEP   G-Code:  FOTO = 44% limitation (status as of initial eval; unable to reassess secondary patient failed to return for further therapy)  Functional Limitation - Mobility: Walking and moving around  Current status (Initial status) - CK  Goal status - CJ   D/C status - CK  Plan: Patient agrees to discharge.  Patient goals were not met. Patient is being discharged due to not returning since the last visit.  ?????       Percival Spanish, PT, MPT 07/10/2015, 8:36 AM  [facility] [address]  [place], [place], [address] Phone: [phone]   Fax:  [phone]

## 2015-06-09 ENCOUNTER — Ambulatory Visit: Payer: Medicare Other | Admitting: Rehabilitation

## 2015-06-13 ENCOUNTER — Ambulatory Visit: Payer: Medicare Other | Admitting: Rehabilitation

## 2015-06-16 ENCOUNTER — Ambulatory Visit: Payer: Medicare Other | Admitting: Physical Therapy

## 2015-06-20 ENCOUNTER — Ambulatory Visit: Payer: Medicare Other | Admitting: Rehabilitation

## 2015-06-21 ENCOUNTER — Ambulatory Visit: Payer: Medicare Other | Attending: Orthopedic Surgery | Admitting: Physical Therapy

## 2015-06-21 ENCOUNTER — Ambulatory Visit: Payer: Medicare Other | Admitting: Rehabilitation

## 2015-06-22 ENCOUNTER — Ambulatory Visit: Payer: Medicare Other | Admitting: Physical Therapy

## 2015-07-19 DIAGNOSIS — M5116 Intervertebral disc disorders with radiculopathy, lumbar region: Secondary | ICD-10-CM | POA: Diagnosis not present

## 2015-07-19 DIAGNOSIS — M545 Low back pain: Secondary | ICD-10-CM | POA: Diagnosis not present

## 2016-02-16 ENCOUNTER — Emergency Department (HOSPITAL_COMMUNITY): Payer: Medicare Other

## 2016-02-16 ENCOUNTER — Emergency Department (HOSPITAL_COMMUNITY)
Admission: EM | Admit: 2016-02-16 | Discharge: 2016-02-16 | Disposition: A | Discharge status: Home / Self Care | Payer: Medicare Other | Attending: Emergency Medicine | Admitting: Emergency Medicine

## 2016-02-16 ENCOUNTER — Encounter (HOSPITAL_COMMUNITY): Payer: Self-pay | Admitting: Vascular Surgery

## 2016-02-16 DIAGNOSIS — Z7901 Long term (current) use of anticoagulants: Secondary | ICD-10-CM | POA: Insufficient documentation

## 2016-02-16 DIAGNOSIS — J189 Pneumonia, unspecified organism: Secondary | ICD-10-CM

## 2016-02-16 DIAGNOSIS — F419 Anxiety disorder, unspecified: Secondary | ICD-10-CM | POA: Diagnosis not present

## 2016-02-16 DIAGNOSIS — Z79899 Other long term (current) drug therapy: Secondary | ICD-10-CM | POA: Diagnosis not present

## 2016-02-16 DIAGNOSIS — Z8639 Personal history of other endocrine, nutritional and metabolic disease: Secondary | ICD-10-CM | POA: Diagnosis not present

## 2016-02-16 DIAGNOSIS — F319 Bipolar disorder, unspecified: Secondary | ICD-10-CM | POA: Insufficient documentation

## 2016-02-16 DIAGNOSIS — I10 Essential (primary) hypertension: Secondary | ICD-10-CM | POA: Insufficient documentation

## 2016-02-16 DIAGNOSIS — Z8719 Personal history of other diseases of the digestive system: Secondary | ICD-10-CM | POA: Diagnosis not present

## 2016-02-16 DIAGNOSIS — R05 Cough: Secondary | ICD-10-CM | POA: Diagnosis present

## 2016-02-16 DIAGNOSIS — Z880 Allergy status to penicillin: Secondary | ICD-10-CM | POA: Insufficient documentation

## 2016-02-16 DIAGNOSIS — J159 Unspecified bacterial pneumonia: Secondary | ICD-10-CM | POA: Insufficient documentation

## 2016-02-16 DIAGNOSIS — L309 Dermatitis, unspecified: Secondary | ICD-10-CM | POA: Insufficient documentation

## 2016-02-16 DIAGNOSIS — R112 Nausea with vomiting, unspecified: Secondary | ICD-10-CM | POA: Diagnosis not present

## 2016-02-16 DIAGNOSIS — Z8739 Personal history of other diseases of the musculoskeletal system and connective tissue: Secondary | ICD-10-CM | POA: Diagnosis not present

## 2016-02-16 DIAGNOSIS — Z8611 Personal history of tuberculosis: Secondary | ICD-10-CM | POA: Diagnosis not present

## 2016-02-16 DIAGNOSIS — F17210 Nicotine dependence, cigarettes, uncomplicated: Secondary | ICD-10-CM | POA: Insufficient documentation

## 2016-02-16 DIAGNOSIS — J449 Chronic obstructive pulmonary disease, unspecified: Secondary | ICD-10-CM | POA: Diagnosis not present

## 2016-02-16 DIAGNOSIS — R079 Chest pain, unspecified: Secondary | ICD-10-CM | POA: Diagnosis not present

## 2016-02-16 LAB — BASIC METABOLIC PANEL
Anion gap: 11 (ref 5–15)
BUN: 11 mg/dL (ref 6–20)
CALCIUM: 9.6 mg/dL (ref 8.9–10.3)
CO2: 24 mmol/L (ref 22–32)
CREATININE: 1.04 mg/dL (ref 0.61–1.24)
Chloride: 101 mmol/L (ref 101–111)
GFR calc Af Amer: >60 mL/min (ref >60)
GFR calc non Af Amer: >60 mL/min (ref >60)
GLUCOSE: 88 mg/dL (ref 65–99)
Potassium: 4.3 mmol/L (ref 3.5–5.1)
Sodium: 136 mmol/L (ref 135–145)

## 2016-02-16 LAB — I-STAT TROPONIN, ED: TROPONIN I, POC: 0.03 ng/mL (ref 0.00–0.08)

## 2016-02-16 LAB — CBC
HCT: 45.2 % (ref 39.0–52.0)
Hemoglobin: 14.7 g/dL (ref 13.0–17.0)
MCH: 30.2 pg (ref 26.0–34.0)
MCHC: 32.5 g/dL (ref 30.0–36.0)
MCV: 92.8 fL (ref 78.0–100.0)
PLATELETS: 356 10*3/uL (ref 150–400)
RBC: 4.87 MIL/uL (ref 4.22–5.81)
RDW: 14.2 % (ref 11.5–15.5)
WBC: 13.2 10*3/uL — ABNORMAL HIGH (ref 4.0–10.5)

## 2016-02-16 MED ORDER — BENZONATATE 100 MG PO CAPS: 100.0000 mg | ORAL_CAPSULE | Freq: Three times a day (TID) | ORAL | Status: DC

## 2016-02-16 MED ORDER — AZITHROMYCIN 250 MG PO TABS: 250.0000 mg | ORAL_TABLET | Freq: Every day | ORAL | Status: DC

## 2016-02-16 MED ORDER — ONDANSETRON 4 MG PO TBDP: 4.0000 mg | ORAL_TABLET | Freq: Three times a day (TID) | ORAL | Status: DC | PRN

## 2016-02-16 NOTE — Discharge Instructions (Signed)
You have been seen today for a cough. Your x-ray showed evidence of a possible pneumonia. Please take all of your antibiotics until finished!   You may develop abdominal discomfort or diarrhea from the antibiotic.  You may help offset this with probiotics which you can buy or get in yogurt. Do not eat or take the probiotics until 2 hours after your antibiotic. The pneumonia may be covering up another abnormality. You should have a repeat chest x-ray in 3-4 weeks to assure that what looks like pneumonia is not actually something else. Follow up with PCP as soon as possible for reevaluation and repeat chest x-ray. Return to ED should symptoms worsen.

## 2016-02-16 NOTE — ED Provider Notes (Signed)
CSN: [id number]     Arrival date & time 02/16/16  1249 History  By signing my name below, I, Dora Sims, attest that this documentation has been prepared under the direction and in the presence of non-physician practitioner, Arlean Hopping, PA-C. Electronically Signed: Dora Sims, Scribe. 02/16/2016. 2:09 PM.   Chief Complaint  Patient presents with  . Cough    The history is provided by the patient. No language interpreter was used.     HPI Comments: Andrew [name] is a 50 y.o. male with h/o bronchitis, COPD, GERD, and pneumonia who presents to the Emergency Department complaining of sudden onset, constant, severe, productive cough with yellow sputum for the last two days. He endorses associated chest pain exacerbated by his cough, vomiting with food intake, fever, chills, congestion, and generalized myalgias. Pt notes that his cough is exacerbated by deep inhalation. He also notes a chronic rash to his bilateral elbows and abdomen. Pt is a smoker; he states that he smokes one pack every three days. He reports that he was diagnosed with pneumonia earlier this year. Pt has no h/o HIV, cancer, or steroid use. He denies weakness, shortness of breath, or any other associated symptoms.  Past Medical History  Diagnosis Date  . Umbilical hernia   . Anxiety   . Depression   . Hypertension     takes HCTZ daily  . Hyperlipidemia     takes Zetia daily  . History of bronchitis     1 month ago and treated with Amoxicillin and given an inhaler;takes Prenisone  . Pneumonia     hx of 2 yrs ago  . Generalized headaches     history of migraines-last one 02/02/14  . Dizziness   . Arthritis     right knee  . Joint pain   . Joint swelling   . Back pain     occasionally  . GERD (gastroesophageal reflux disease)     takes Prilosec daily as needed  . Headache   . Polysubstance abuse   . COPD (chronic obstructive pulmonary disease) ([place])   . Tuberculosis     treated for 1 year  . Bipolar disorder  ([place])     denies  . Schizoaffective disorder ([place])     denies  . Hemorrhoids    Past Surgical History  Procedure Laterality Date  . Lipoma excision  ~ 2010    "back left side of my head"  . Esophagogastroduodenoscopy    . Knee arthroscopy with medial menisectomy Right 03/01/2014    Procedure: KNEE ARTHROSCOPY WITH MEDIAL MENISECTOMY;  Surgeon: Meredith Pel, MD;  Location: [place];  Service: Orthopedics;  Laterality: Right;  . Joint replacement    . Total knee arthroplasty Right   . Partial knee arthroplasty Right 01/24/2015    Procedure: RIGHT KNEE MEDIAL COMPARTMENT REPLACEMENT VS TOTAL KNEE ARTHROPLASTY.;  Surgeon: Meredith Pel, MD;  Location: [place];  Service: Orthopedics;  Laterality: Right;  RIGHT KNEE MEDIAL COMPARTMENT REPLACEMENT VS TOTAL KNEE ARTHROPLASTY.   Family History  Problem Relation Age of Onset  . Hypertension Mother   . Cancer Mother   . Heart disease Father   . Heart attack Father 50  . Hyperlipidemia Father   . Hypertension Father   . Diabetes Sister   . Diabetes Brother    Social History  Substance Use Topics  . Smoking status: Current Some Day Smoker -- 0.25 packs/day for 32 years    Types: Cigarettes  . Smokeless tobacco: Never  Used  . Alcohol Use: Yes     Comment: occasionally     Review of Systems  Constitutional: Positive for fever (subjective) and chills.  HENT: Positive for congestion.   Respiratory: Positive for cough. Negative for shortness of breath.   Cardiovascular: Positive for chest pain (with cough).  Gastrointestinal: Positive for nausea and vomiting. Negative for diarrhea and constipation.  Musculoskeletal: Positive for myalgias.  Skin: Positive for rash (chronic). Negative for color change and pallor.  Neurological: Negative for dizziness, weakness, light-headedness and headaches.  All other systems reviewed and are negative.   Allergies  Penicillins and Mushroom extract complex  Home Medications   Prior to  Admission medications   Medication Sig Start Date End Date Taking? Authorizing Provider  albuterol (PROVENTIL HFA;VENTOLIN HFA) 108 (90 BASE) MCG/ACT inhaler Inhale 2 puffs into the lungs every 4 (four) hours as needed for wheezing or shortness of breath. 09/04/14   Jola Schmidt, MD  ALPRAZolam Duanne Moron) 0.5 MG tablet Take 1 tablet (0.5 mg total) by mouth 2 (two) times daily as needed for anxiety. 01/27/15   Meredith Pel, MD  azithromycin (ZITHROMAX) 250 MG tablet Take 1 tablet (250 mg total) by mouth daily. Take first 2 tablets together, then 1 every day until finished. 02/16/16   Shawn C Joy, PA-C  benzonatate (TESSALON) 100 MG capsule Take 1 capsule (100 mg total) by mouth every 8 (eight) hours. 02/16/16   Shawn C Joy, PA-C  hydrochlorothiazide (MICROZIDE) 12.5 MG capsule Take 1 capsule (12.5 mg total) by mouth daily. Patient not taking: Reported on 11/26/2014 12/09/13   Montez Morita, MD  methocarbamol (ROBAXIN) 500 MG tablet Take 1 tablet (500 mg total) by mouth 4 (four) times daily. 01/27/15   Meredith Pel, MD  ondansetron (ZOFRAN ODT) 4 MG disintegrating tablet Take 1 tablet (4 mg total) by mouth every 8 (eight) hours as needed for nausea or vomiting. 02/16/16   Shawn C Joy, PA-C  oxyCODONE (OXY IR/ROXICODONE) 5 MG immediate release tablet Take 1-2 tablets (5-10 mg total) by mouth every 3 (three) hours as needed for breakthrough pain. 01/27/15   Meredith Pel, MD  OxyCODONE (OXYCONTIN) 10 mg T12A 12 hr tablet Take 1 tablet (10 mg total) by mouth every 12 (twelve) hours. 01/27/15   Meredith Pel, MD  rivaroxaban (XARELTO) 10 MG TABS tablet Take 1 tablet (10 mg total) by mouth daily with breakfast. 01/27/15   Meredith Pel, MD   BP 120/72 mmHg  Pulse 98  Temp(Src) 99.6 F (37.6 C) (Oral)  Resp 18  Ht 5\' 8"  (1.727 m)  SpO2 100% Physical Exam  Constitutional: He appears well-developed and well-nourished. No distress.  HENT:  Head: Normocephalic and atraumatic.   Mouth/Throat: Oropharynx is clear and moist.  Eyes: Conjunctivae are normal. Pupils are equal, round, and reactive to light.  Neck: Normal range of motion. Neck supple.  Cardiovascular: Normal rate, regular rhythm, normal heart sounds and intact distal pulses.   Pulmonary/Chest: Effort normal and breath sounds normal. No respiratory distress. He has no wheezes. He has no rales. He exhibits tenderness.  No increased work of breathing. Patient speaks in full sentences without difficulty.  Abdominal: Soft. There is no tenderness. There is no guarding.  Musculoskeletal: He exhibits no edema or tenderness.  Beefy red plaques on the posterior bilateral elbows and abdomen. Eczema type rash on the flexor surfaces of both elbows.  Lymphadenopathy:    He has no cervical adenopathy.  Neurological: He is alert.  Skin: Skin is warm and dry. He is not diaphoretic.  Psychiatric: He has a normal mood and affect. His behavior is normal.  Nursing note and vitals reviewed.   ED Course  Procedures (including critical care time)  DIAGNOSTIC STUDIES: Oxygen Saturation is 100% on RA, normal by my interpretation.    COORDINATION OF CARE: 2:09 PM Discussed treatment plan with pt at bedside and pt agreed to plan.  Labs Review Labs Reviewed  CBC - Abnormal; Notable for the following:    WBC 13.2 (*)    All other components within normal limits  BASIC METABOLIC PANEL  I-STAT TROPOININ, ED    Imaging Review Dg Chest 2 View  02/16/2016  CLINICAL DATA:  Left-sided chest pain, cough, chills and vomiting for 2 days. EXAM: CHEST  2 VIEW COMPARISON:  11/26/2014 FINDINGS: New left lower lobe airspace disease is seen, consistent with pneumonia. Right lung is clear. Bullous emphysema noted. No evidence of pleural effusion or pneumothorax. IMPRESSION: New left lower lobe airspace disease, consistent with pneumonia. Followup PA and lateral chest X-ray is recommended in 3-4 weeks following trial of antibiotic therapy  to ensure resolution and exclude underlying malignancy. Bullous emphysema. Electronically Signed   By: Earle Gell M.D.   On: 02/16/2016 13:28   I have personally reviewed and evaluated these images and lab results as part of my medical decision-making.   EKG Interpretation None      MDM   Final diagnoses:  CAP (community acquired pneumonia)    Barth C Mancine presents with cough and chest soreness for the last 2 days.  Patient has no red flag symptoms. Suspect possible pneumonia, confirmed on chest x-ray. Patient also may have a concurrent, viral illness such as influenza. Patient is nontoxic appearing, afebrile, not tachycardic, not tachypneic, maintains SPO2 of 100% on room air, and is in no apparent distress. Patient has no signs of sepsis or other serious or life-threatening condition. Recommendations on the chest x-ray read were passed on to the patient. Home care and return precautions discussed. Patient to follow up with PCP as soon as possible for reevaluation. Patient voiced understanding of these instructions, agrees to the plan, and is comfortable with discharge. Suspect the patient's "rashes" are psoriasis and eczema. Both are chronic. Patient was advised to follow-up with his PCP on these matters.  Filed Vitals:   02/16/16 1301 02/16/16 1456  BP: 109/79 120/72  Pulse: 83 98  Temp: 98.8 F (37.1 C) 99.6 F (37.6 C)  TempSrc: Oral Oral  Resp: 16 18  Height: 5\' 8"  (1.727 m)   SpO2: 100% 100%       I personally performed the services described in this documentation, which was scribed in my presence. The recorded information has been reviewed and is accurate.   Lorayne Bender, PA-C 02/16/16 [place], MD 02/18/16 2034

## 2016-02-16 NOTE — ED Notes (Signed)
Pt reports to the ED for eval of productive yellow cough, chest soreness, and chest congestion. Cough x 2 days. CP started yesterday. Reports some associated SOB, N/V, and dizziness. Pt A&Ox4, resp e/u, and skin warm and dry. Coughing and movement make the pain worse.

## 2016-05-14 ENCOUNTER — Encounter (HOSPITAL_COMMUNITY): Payer: Self-pay | Admitting: Emergency Medicine

## 2016-05-14 ENCOUNTER — Emergency Department (HOSPITAL_COMMUNITY): Payer: Medicare Other

## 2016-05-14 ENCOUNTER — Emergency Department (HOSPITAL_COMMUNITY)
Admission: EM | Admit: 2016-05-14 | Discharge: 2016-05-14 | Disposition: A | Discharge status: Home / Self Care | Payer: Medicare Other | Attending: Emergency Medicine | Admitting: Emergency Medicine

## 2016-05-14 DIAGNOSIS — R197 Diarrhea, unspecified: Secondary | ICD-10-CM | POA: Diagnosis not present

## 2016-05-14 DIAGNOSIS — R0602 Shortness of breath: Secondary | ICD-10-CM | POA: Diagnosis not present

## 2016-05-14 DIAGNOSIS — F17210 Nicotine dependence, cigarettes, uncomplicated: Secondary | ICD-10-CM | POA: Insufficient documentation

## 2016-05-14 DIAGNOSIS — R0981 Nasal congestion: Secondary | ICD-10-CM | POA: Insufficient documentation

## 2016-05-14 DIAGNOSIS — Z7901 Long term (current) use of anticoagulants: Secondary | ICD-10-CM | POA: Diagnosis not present

## 2016-05-14 DIAGNOSIS — R0789 Other chest pain: Secondary | ICD-10-CM

## 2016-05-14 DIAGNOSIS — J449 Chronic obstructive pulmonary disease, unspecified: Secondary | ICD-10-CM | POA: Insufficient documentation

## 2016-05-14 DIAGNOSIS — Z79899 Other long term (current) drug therapy: Secondary | ICD-10-CM | POA: Insufficient documentation

## 2016-05-14 DIAGNOSIS — I10 Essential (primary) hypertension: Secondary | ICD-10-CM | POA: Diagnosis not present

## 2016-05-14 DIAGNOSIS — R079 Chest pain, unspecified: Secondary | ICD-10-CM | POA: Diagnosis not present

## 2016-05-14 DIAGNOSIS — J0100 Acute maxillary sinusitis, unspecified: Secondary | ICD-10-CM | POA: Insufficient documentation

## 2016-05-14 LAB — CBC WITH DIFFERENTIAL/PLATELET
BASOS PCT: 0 %
Basophils Absolute: 0 10*3/uL (ref 0.0–0.1)
EOS ABS: 0.2 10*3/uL (ref 0.0–0.7)
EOS PCT: 3 %
HCT: 42.4 % (ref 39.0–52.0)
Hemoglobin: 14 g/dL (ref 13.0–17.0)
Lymphocytes Relative: 28 %
Lymphs Abs: 1.7 10*3/uL (ref 0.7–4.0)
MCH: 31 pg (ref 26.0–34.0)
MCHC: 33 g/dL (ref 30.0–36.0)
MCV: 94 fL (ref 78.0–100.0)
MONO ABS: 0.4 10*3/uL (ref 0.1–1.0)
MONOS PCT: 7 %
NEUTROS PCT: 62 %
Neutro Abs: 3.8 10*3/uL (ref 1.7–7.7)
Platelets: 268 10*3/uL (ref 150–400)
RBC: 4.51 MIL/uL (ref 4.22–5.81)
RDW: 13.4 % (ref 11.5–15.5)
WBC: 6 10*3/uL (ref 4.0–10.5)

## 2016-05-14 LAB — I-STAT TROPONIN, ED: TROPONIN I, POC: 0 ng/mL (ref 0.00–0.08)

## 2016-05-14 LAB — BASIC METABOLIC PANEL
Anion gap: 4 — ABNORMAL LOW (ref 5–15)
BUN: 13 mg/dL (ref 6–20)
CALCIUM: 9.1 mg/dL (ref 8.9–10.3)
CO2: 25 mmol/L (ref 22–32)
CREATININE: 0.94 mg/dL (ref 0.61–1.24)
Chloride: 109 mmol/L (ref 101–111)
GFR calc non Af Amer: >60 mL/min (ref >60)
Glucose, Bld: 109 mg/dL — ABNORMAL HIGH (ref 65–99)
Potassium: 4.1 mmol/L (ref 3.5–5.1)
SODIUM: 138 mmol/L (ref 135–145)

## 2016-05-14 MED ORDER — FLUTICASONE PROPIONATE 50 MCG/ACT NA SUSP: 2.0000 | Freq: Every day | NASAL | 0 refills | Status: DC

## 2016-05-14 MED ORDER — LOPERAMIDE HCL 2 MG PO CAPS
4.0000 mg | ORAL_CAPSULE | Freq: Once | ORAL | Status: AC
  Administered 2016-05-14: 4 mg via ORAL
  Filled 2016-05-14 (×2): qty 2

## 2016-05-14 MED ORDER — DEXAMETHASONE SODIUM PHOSPHATE 10 MG/ML IJ SOLN
10.0000 mg | Freq: Once | INTRAMUSCULAR | Status: AC
  Administered 2016-05-14: 10 mg via INTRAMUSCULAR
  Filled 2016-05-14: qty 1

## 2016-05-14 MED ORDER — OXYMETAZOLINE HCL 0.05 % NA SOLN
2.0000 | Freq: Once | NASAL | Status: AC
  Administered 2016-05-14: 2 via NASAL
  Filled 2016-05-14: qty 15

## 2016-05-14 MED ORDER — IBUPROFEN 800 MG PO TABS
800.0000 mg | ORAL_TABLET | Freq: Once | ORAL | Status: AC
  Administered 2016-05-14: 800 mg via ORAL
  Filled 2016-05-14: qty 1

## 2016-05-14 MED ORDER — IBUPROFEN 800 MG PO TABS: 800.0000 mg | ORAL_TABLET | Freq: Three times a day (TID) | ORAL | 0 refills | Status: DC | PRN

## 2016-05-14 MED ORDER — LOPERAMIDE HCL 2 MG PO CAPS: 2.0000 mg | ORAL_CAPSULE | Freq: Four times a day (QID) | ORAL | 0 refills | Status: DC | PRN

## 2016-05-14 NOTE — ED Provider Notes (Signed)
TIME SEEN:  By signing my name below, I, Arianna Nassar, attest that this documentation has been prepared under the direction and in the presence of [employer], DO.  Electronically Signed: Julien Nordmann, ED Scribe. 05/14/16. 3:15 AM.   CHIEF COMPLAINT:  Chief Complaint  Patient presents with  . Nasal Congestion     HPI:  HPI Comments: Andrew [name] is a 50 y.o. male who has a PMhx of COPD,Hypertension, HLD, and polysubstance abuse presents to the Emergency Department With multiple different complaints. First patient is complaining of gradual onset, gradual worsening, moderate sinus nasal congestion onset yesterday. States that his left nostril has been congested in his left cheek feels full. He reports associated hot and cold chills. No cough.  Has not tried any medications at home for this.  Secondly patient complains of diarrhea for several days.  No nausea or vomiting. No abdominal pain. No sick contacts or recent travel. No antibiotic use. Has not tried any medications at home for this.  Last, Pt also reports sharp left sided chest wall pain that he rates a 7/10. He says his chest pain is worse with movement, palpation. Pt has not taken any medication to alleviate his pain. He denies cough.  Denies shortness of breath, diaphoresis or dizziness.   ROS: See HPI Constitutional: no fever  Eyes: no drainage  ENT: no runny nose   Cardiovascular:  chest pain  Resp: no SOB  GI: no vomiting GU: no dysuria Integumentary: no rash  Allergy: no hives  Musculoskeletal: no leg swelling  Neurological: no slurred speech ROS otherwise negative  PAST MEDICAL HISTORY/PAST SURGICAL HISTORY:  Past Medical History:  Diagnosis Date  . Anxiety   . Arthritis    right knee  . Back pain    occasionally  . Bipolar disorder ([place])    denies  . COPD (chronic obstructive pulmonary disease) ([place])   . Depression   . Dizziness   . Generalized headaches    history of migraines-last one 02/02/14   . GERD (gastroesophageal reflux disease)    takes Prilosec daily as needed  . Headache   . Hemorrhoids   . History of bronchitis    1 month ago and treated with Amoxicillin and given an inhaler;takes Prenisone  . Hyperlipidemia    takes Zetia daily  . Hypertension    takes HCTZ daily  . Joint pain   . Joint swelling   . Pneumonia    hx of 2 yrs ago  . Polysubstance abuse   . Schizoaffective disorder ([place])    denies  . Tuberculosis    treated for 1 year  . Umbilical hernia     MEDICATIONS:  Prior to Admission medications   Medication Sig Start Date End Date Taking? Authorizing Provider  albuterol (PROVENTIL HFA;VENTOLIN HFA) 108 (90 BASE) MCG/ACT inhaler Inhale 2 puffs into the lungs every 4 (four) hours as needed for wheezing or shortness of breath. 09/04/14   Jola Schmidt, MD  ALPRAZolam Duanne Moron) 0.5 MG tablet Take 1 tablet (0.5 mg total) by mouth 2 (two) times daily as needed for anxiety. 01/27/15   Meredith Pel, MD  azithromycin (ZITHROMAX) 250 MG tablet Take 1 tablet (250 mg total) by mouth daily. Take first 2 tablets together, then 1 every day until finished. 02/16/16   Shawn C Joy, PA-C  benzonatate (TESSALON) 100 MG capsule Take 1 capsule (100 mg total) by mouth every 8 (eight) hours. 02/16/16   Shawn C Joy, PA-C  hydrochlorothiazide (MICROZIDE)  12.5 MG capsule Take 1 capsule (12.5 mg total) by mouth daily. Patient not taking: Reported on 11/26/2014 12/09/13   Montez Morita, MD  methocarbamol (ROBAXIN) 500 MG tablet Take 1 tablet (500 mg total) by mouth 4 (four) times daily. 01/27/15   Meredith Pel, MD  ondansetron (ZOFRAN ODT) 4 MG disintegrating tablet Take 1 tablet (4 mg total) by mouth every 8 (eight) hours as needed for nausea or vomiting. 02/16/16   Shawn C Joy, PA-C  oxyCODONE (OXY IR/ROXICODONE) 5 MG immediate release tablet Take 1-2 tablets (5-10 mg total) by mouth every 3 (three) hours as needed for breakthrough pain. 01/27/15   Meredith Pel, MD   OxyCODONE (OXYCONTIN) 10 mg T12A 12 hr tablet Take 1 tablet (10 mg total) by mouth every 12 (twelve) hours. 01/27/15   Meredith Pel, MD  rivaroxaban (XARELTO) 10 MG TABS tablet Take 1 tablet (10 mg total) by mouth daily with breakfast. 01/27/15   Meredith Pel, MD    ALLERGIES:  Allergies  Allergen Reactions  . Penicillins Hives  . Mushroom Extract Complex Hives    SOCIAL HISTORY:  Social History  Substance Use Topics  . Smoking status: Current Some Day Smoker    Types: Cigarettes  . Smokeless tobacco: Never Used  . Alcohol use Yes     Comment: occasionally     FAMILY HISTORY: Family History  Problem Relation Age of Onset  . Hypertension Mother   . Cancer Mother   . Heart disease Father   . Heart attack Father 36  . Hyperlipidemia Father   . Hypertension Father   . Diabetes Sister   . Diabetes Brother     EXAM: BP 119/78 (BP Location: Right Arm)   Pulse 76   Temp 98.3 F (36.8 C) (Oral)   Resp 14   Ht 5' 7.5" (1.715 m)   Wt 177 lb 1 oz (80.3 kg)   SpO2 99%   BMI 27.32 kg/m  CONSTITUTIONAL: Alert and oriented and responds appropriately to questions. Well-appearing; well-nourished HEAD: Normocephalic EYES: Conjunctivae clear, PERRL ENT: normal nose; no rhinorrhea; moist mucous membranes; TMs are clear bilaterally without erythema, purulence, bulging, perforation, effusion.  No cerumen impaction or sign of foreign body in the external auditory canal. No inflammation, erythema or drainage from the external auditory canal. No signs of mastoiditis. No pain with manipulation of the pinna bilaterally.  No pharyngeal erythema or petechiae, no tonsillar hypertrophy or exudate, no uvular deviation, no trismus or drooling, normal phonation, no stridor, no dental caries or abscess noted, no Ludwig's angina, tongue sits flat in the bottom of the mouth NECK: Supple, no meningismus, no LAD  CARD: RRR; S1 and S2 appreciated; no murmurs, no clicks, no rubs, no gallops;  tenderness of the left chest wall which is reproduced when palpated and no crepitus, ecchymosis, deformity or other lesions of the chest wall RESP: Normal chest excursion without splinting or tachypnea; breath sounds clear and equal bilaterally; no wheezes, no rhonchi, no rales, no hypoxia or respiratory distress, speaking full sentences ABD/GI: Normal bowel sounds; non-distended; soft, non-tender, no rebound, no guarding, no peritoneal signs BACK:  The back appears normal and is non-tender to palpation, there is no CVA tenderness EXT: Normal ROM in all joints; non-tender to palpation; no edema; normal capillary refill; no cyanosis, no calf tenderness or swelling    SKIN: Normal color for age and race; warm; no rash NEURO: Moves all extremities equally, sensation to light touch intact diffusely, cranial nerves  II through XII intact PSYCH: The patient's mood and manner are appropriate. Grooming and personal hygiene are appropriate.  MEDICAL DECISION MAKING: Patient here with multiple complaints. Complains of what seems to be left chest wall pain. Pain is been present for several days. EKG shows no new ischemic changes. Troponin negative. Chest x-ray clear. Improved with ibuprofen. Doubt ACS, dissection or PE.   Patient also complaining of diarrhea. No diarrhea and emergency department. Abdominal exam benign. No fevers or vomiting. No travel, sick contacts, recent antibiotic use. Doubt bacterial causes for his diarrhea. Discussed with him this could be a viral illness and have recommended Imodium at home.   Patient also complaining of left-sided sinus pain. No sinus facial cellulitis on exam. No sign of dental abscess. No fever. I do not feel he needs antibiotics at this time. Have treated him symptomatically with Afrin nasal spray, ibuprofen, Decadron. We'll discharge with prescriptions for Flonase, ibuprofen. Have advised him he can use Afrin twice a day for the next 3 days but not use this medication  after that as it may make his congestion worse.    At this time, I do not feel there is any life-threatening condition present. I have reviewed and discussed all results (EKG, imaging, lab, urine as appropriate), exam findings with patient/family. I have reviewed nursing notes and appropriate previous records.  I feel the patient is safe to be discharged home without further emergent workup and can continue workup as an outpatient. Discussed usual and customary return precautions. Patient/family verbalize understanding and are comfortable with this plan.  Outpatient follow-up has been provided. All questions have been answered.          EKG Interpretation  Date/Time:  Tuesday May 14 2016 03:48:42 EDT Ventricular Rate:  74 PR Interval:    QRS Duration: 82 QT Interval:  387 QTC Calculation: 430 R Axis:     Text Interpretation:  Sinus rhythm Anteroseptal infarct, old No significant change since last tracing Confirmed by WARD,  DO, KRISTEN ([id number]) on 05/14/2016 4:03:25 AM        I personally performed the services described in this documentation, which was scribed in my presence. The recorded information has been reviewed and is accurate.    [place], DO 05/14/16 [phone]

## 2016-05-14 NOTE — ED Triage Notes (Signed)
Pt. reports left nasal congestion onset today , denies runny nose or cough , pt. added occasional diarrhea and gastric reflux.

## 2016-05-19 ENCOUNTER — Emergency Department (HOSPITAL_COMMUNITY)
Admission: EM | Admit: 2016-05-19 | Discharge: 2016-05-20 | Discharge status: Against Medical Advice | Payer: Medicare Other | Attending: Emergency Medicine | Admitting: Emergency Medicine

## 2016-05-19 ENCOUNTER — Encounter (HOSPITAL_COMMUNITY): Payer: Self-pay | Admitting: *Deleted

## 2016-05-19 ENCOUNTER — Emergency Department (HOSPITAL_COMMUNITY): Payer: Medicare Other

## 2016-05-19 DIAGNOSIS — Y999 Unspecified external cause status: Secondary | ICD-10-CM | POA: Insufficient documentation

## 2016-05-19 DIAGNOSIS — Z79899 Other long term (current) drug therapy: Secondary | ICD-10-CM | POA: Insufficient documentation

## 2016-05-19 DIAGNOSIS — Y9389 Activity, other specified: Secondary | ICD-10-CM | POA: Diagnosis not present

## 2016-05-19 DIAGNOSIS — M25559 Pain in unspecified hip: Secondary | ICD-10-CM | POA: Diagnosis not present

## 2016-05-19 DIAGNOSIS — F17210 Nicotine dependence, cigarettes, uncomplicated: Secondary | ICD-10-CM | POA: Insufficient documentation

## 2016-05-19 DIAGNOSIS — J449 Chronic obstructive pulmonary disease, unspecified: Secondary | ICD-10-CM | POA: Diagnosis not present

## 2016-05-19 DIAGNOSIS — S61211A Laceration without foreign body of left index finger without damage to nail, initial encounter: Secondary | ICD-10-CM | POA: Diagnosis not present

## 2016-05-19 DIAGNOSIS — Y9289 Other specified places as the place of occurrence of the external cause: Secondary | ICD-10-CM | POA: Insufficient documentation

## 2016-05-19 DIAGNOSIS — W19XXXA Unspecified fall, initial encounter: Secondary | ICD-10-CM

## 2016-05-19 DIAGNOSIS — S20219A Contusion of unspecified front wall of thorax, initial encounter: Secondary | ICD-10-CM | POA: Diagnosis not present

## 2016-05-19 DIAGNOSIS — Z23 Encounter for immunization: Secondary | ICD-10-CM | POA: Insufficient documentation

## 2016-05-19 DIAGNOSIS — Z96651 Presence of right artificial knee joint: Secondary | ICD-10-CM | POA: Insufficient documentation

## 2016-05-19 DIAGNOSIS — S66125A Laceration of flexor muscle, fascia and tendon of left ring finger at wrist and hand level, initial encounter: Secondary | ICD-10-CM | POA: Diagnosis not present

## 2016-05-19 DIAGNOSIS — I10 Essential (primary) hypertension: Secondary | ICD-10-CM | POA: Insufficient documentation

## 2016-05-19 DIAGNOSIS — S6992XA Unspecified injury of left wrist, hand and finger(s), initial encounter: Secondary | ICD-10-CM | POA: Diagnosis present

## 2016-05-19 DIAGNOSIS — F1910 Other psychoactive substance abuse, uncomplicated: Secondary | ICD-10-CM

## 2016-05-19 DIAGNOSIS — S0083XA Contusion of other part of head, initial encounter: Secondary | ICD-10-CM | POA: Diagnosis not present

## 2016-05-19 DIAGNOSIS — S20229A Contusion of unspecified back wall of thorax, initial encounter: Secondary | ICD-10-CM | POA: Diagnosis not present

## 2016-05-19 DIAGNOSIS — S61215A Laceration without foreign body of left ring finger without damage to nail, initial encounter: Secondary | ICD-10-CM | POA: Diagnosis not present

## 2016-05-19 DIAGNOSIS — R0781 Pleurodynia: Secondary | ICD-10-CM | POA: Diagnosis not present

## 2016-05-19 DIAGNOSIS — S61219A Laceration without foreign body of unspecified finger without damage to nail, initial encounter: Secondary | ICD-10-CM

## 2016-05-19 LAB — CBC WITH DIFFERENTIAL/PLATELET
Basophils Absolute: 0 10*3/uL (ref 0.0–0.1)
Basophils Relative: 0 %
EOS ABS: 0.1 10*3/uL (ref 0.0–0.7)
EOS PCT: 1 %
HCT: 42.5 % (ref 39.0–52.0)
Hemoglobin: 14 g/dL (ref 13.0–17.0)
Lymphocytes Relative: 19 %
Lymphs Abs: 2 10*3/uL (ref 0.7–4.0)
MCH: 31 pg (ref 26.0–34.0)
MCHC: 32.9 g/dL (ref 30.0–36.0)
MCV: 94.2 fL (ref 78.0–100.0)
MONO ABS: 1 10*3/uL (ref 0.1–1.0)
MONOS PCT: 9 %
NEUTROS ABS: 7.4 10*3/uL (ref 1.7–7.7)
Neutrophils Relative %: 71 %
PLATELETS: 278 10*3/uL (ref 150–400)
RBC: 4.51 MIL/uL (ref 4.22–5.81)
RDW: 13.5 % (ref 11.5–15.5)
WBC: 10.5 10*3/uL (ref 4.0–10.5)

## 2016-05-19 LAB — TYPE AND SCREEN
ABO/RH(D): O POS
Antibody Screen: NEGATIVE

## 2016-05-19 LAB — I-STAT CHEM 8, ED
BUN: 11 mg/dL (ref 6–20)
CREATININE: 0.9 mg/dL (ref 0.61–1.24)
Calcium, Ion: 1.15 mmol/L (ref 1.13–1.30)
Chloride: 101 mmol/L (ref 101–111)
Glucose, Bld: 94 mg/dL (ref 65–99)
HEMATOCRIT: 40 % (ref 39.0–52.0)
HEMOGLOBIN: 13.6 g/dL (ref 13.0–17.0)
Potassium: 3.7 mmol/L (ref 3.5–5.1)
SODIUM: 140 mmol/L (ref 135–145)
TCO2: 25 mmol/L (ref 0–100)

## 2016-05-19 LAB — HEPATIC FUNCTION PANEL
ALBUMIN: 3.7 g/dL (ref 3.5–5.0)
ALK PHOS: 67 U/L (ref 38–126)
ALT: 14 U/L — ABNORMAL LOW (ref 17–63)
AST: 22 U/L (ref 15–41)
BILIRUBIN INDIRECT: 0.5 mg/dL (ref 0.3–0.9)
BILIRUBIN TOTAL: 0.6 mg/dL (ref 0.3–1.2)
Bilirubin, Direct: 0.1 mg/dL (ref 0.1–0.5)
Total Protein: 6.8 g/dL (ref 6.5–8.1)

## 2016-05-19 MED ORDER — IOPAMIDOL (ISOVUE-300) INJECTION 61%
INTRAVENOUS | Status: DC
Start: 2016-05-19 — End: 2016-05-20
  Filled 2016-05-19: qty 100

## 2016-05-19 MED ORDER — MORPHINE SULFATE (PF) 4 MG/ML IV SOLN
4.0000 mg | Freq: Once | INTRAVENOUS | Status: DC
Start: 2016-05-19 — End: 2016-05-20
  Filled 2016-05-19: qty 1

## 2016-05-19 MED ORDER — TETANUS-DIPHTH-ACELL PERTUSSIS 5-2.5-18.5 LF-MCG/0.5 IM SUSP
0.5000 mL | Freq: Once | INTRAMUSCULAR | Status: AC
  Administered 2016-05-19: 0.5 mL via INTRAMUSCULAR
  Filled 2016-05-19: qty 0.5
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
## 2016-05-19 NOTE — ED Notes (Signed)
Applied hard ccollar, pt did not tolerate. Verbal order per Dr. Winfred Leeds to remove.
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
## 2016-05-20 ENCOUNTER — Emergency Department (HOSPITAL_COMMUNITY): Payer: Medicare Other

## 2016-05-20 DIAGNOSIS — S66125A Laceration of flexor muscle, fascia and tendon of left ring finger at wrist and hand level, initial encounter: Secondary | ICD-10-CM | POA: Diagnosis not present

## 2016-05-20 LAB — URINE MICROSCOPIC-ADD ON

## 2016-05-20 LAB — RAPID URINE DRUG SCREEN, HOSP PERFORMED
Amphetamines: NOT DETECTED
BENZODIAZEPINES: NOT DETECTED
Barbiturates: NOT DETECTED
COCAINE: POSITIVE — AB
OPIATES: NOT DETECTED
Tetrahydrocannabinol: NOT DETECTED

## 2016-05-20 LAB — URINALYSIS, ROUTINE W REFLEX MICROSCOPIC
BILIRUBIN URINE: NEGATIVE
Glucose, UA: NEGATIVE mg/dL
Hgb urine dipstick: NEGATIVE
KETONES UR: 15 mg/dL — AB
NITRITE: NEGATIVE
Protein, ur: NEGATIVE mg/dL
SPECIFIC GRAVITY, URINE: 1.024 (ref 1.005–1.030)
pH: 6 (ref 5.0–8.0)

## 2016-05-20 LAB — ETHANOL: Alcohol, Ethyl (B): <5 mg/dL (ref <5)

## 2016-05-20 MED ORDER — NALOXONE HCL 0.4 MG/ML IJ SOLN
INTRAMUSCULAR | Status: AC
  Administered 2016-05-20: 0.4 mg
  Filled 2016-05-20: qty 1

## 2016-05-20 MED ORDER — HALOPERIDOL LACTATE 5 MG/ML IJ SOLN
INTRAMUSCULAR | Status: AC | PRN
  Administered 2016-05-20: 2 mg via INTRAVENOUS

## 2016-05-20 MED ORDER — CEPHALEXIN 500 MG PO CAPS: 500.0000 mg | ORAL_CAPSULE | Freq: Four times a day (QID) | ORAL | 0 refills | Status: DC

## 2016-05-20 MED ORDER — LIDOCAINE HCL (PF) 1 % IJ SOLN
30.0000 mL | Freq: Once | INTRAMUSCULAR | Status: AC
  Administered 2016-05-20: 30 mL
  Filled 2016-05-20: qty 30

## 2016-05-20 MED ORDER — HALOPERIDOL LACTATE 5 MG/ML IJ SOLN
2.0000 mg | Freq: Once | INTRAMUSCULAR | Status: AC
  Administered 2016-05-20: 2 mg via INTRAVENOUS
  Filled 2016-05-20: qty 1
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
## 2016-05-20 NOTE — ED Notes (Signed)
This RN and Horton MD at CT with patient and patient is still refusing to have the CT done.  MD explained the risk for head bleed and patient still refusing scan

## 2016-05-20 NOTE — ED Notes (Signed)
This RN took patient to CT and patient was asleep on CT table then sat up and refused the scan for 3rd time.  MD made aware and plan is now to watch for signs and symptoms of a bleed.  Will continue to monitor.

## 2016-05-20 NOTE — ED Provider Notes (Signed)
Patient signed out by Dr. Cathleen Fears pending imaging. Possible extensor tendon injury of the left fourth digit. X-rays negative. Patient is refusing CT scan at this time. On my evaluation, he is sleepy but arousable. He adamantly refuses further imaging. I discussed with him that I'm unable to rule out head injury or internal injury without further assessment. He continues to refuse.     2:04 AM Discussed at length with the patient that I feel he really needs a head scan. His alcohol level is negative. He is adamantly refusing. He was given Haldol and attempt to sedate for this procedure. CT was still unobtainable secondary to patient not being cooperative. He reports that he understands the risk of death and that I cannot rule out a head bleed. He is somnolent but does appear oriented.  6:00 AM Patient more arousable. He is able to ambulate independently. He is now consenting to repair of his left finger. He continues to refuse further imaging. He is awake, alert, and oriented 3. I discussed with him again at length that I cannot rule out a head injury given how sleepy he has been recently. Patient states "I just haven't slept much." I feel he has capacity at this time. Will discharge [place].  Clinical Course  Comment By Time  Exam under digital block of left 4th digit with some extensor movement.  Limited 2/2 patient cooperation.  Exploration of wound with concern for at least partial injury.  Discussed with Dr. Grandville Silos.  Will repair.  Abx.  F/U in office. Merryl Hacker, MD 08/14 [phone]  Patient refusing all care at this time including repair of finger laceration.  UDS pending. Merryl Hacker, MD 08/14 [phone]   LACERATION REPAIR Performed by: Merryl Hacker Authorized by: Merryl Hacker Consent: Verbal consent obtained. Risks and benefits: risks, benefits and alternatives were discussed Consent given by: patient Patient identity confirmed: provided demographic  data Prepped and Draped in normal sterile fashion Wound explored  Laceration Location: left 4th digit  Laceration Length: 3cm  No Foreign Bodies seen or palpated  Anesthesia: local infiltration  Local anesthetic: lidocaine 1 % wo epinephrine  Anesthetic total: 3 ml  Irrigation method: syringe Amount of cleaning: standard  Skin closure: 4-0 nylon   Number of sutures: 6  Technique: interrupted  Patient tolerance: Patient tolerated the procedure well with no immediate complications.    Merryl Hacker, MD 05/20/16 [phone]
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
## 2016-05-20 NOTE — ED Notes (Addendum)
Ambulated in hall.  Steady gait noted.  Dressing and finger splint applied to left middle finger.  Tolerated well.

## 2016-05-20 NOTE — ED Notes (Signed)
Patient sat up at CT and refused the CT scan.  MD made aware

## 2016-05-20 NOTE — ED Notes (Signed)
Report given to [facility]. Patient stable at this time.  No signs of distress noted

## 2016-05-20 NOTE — ED Notes (Signed)
Awake and alert at this time.  Answering questions.  Food provided with okay from physician.  No distress noted at this time.

## 2016-06-03 DIAGNOSIS — Z4802 Encounter for removal of sutures: Secondary | ICD-10-CM | POA: Diagnosis not present

## 2016-06-04 DIAGNOSIS — F319 Bipolar disorder, unspecified: Secondary | ICD-10-CM | POA: Diagnosis not present

## 2016-07-09 DIAGNOSIS — H11153 Pinguecula, bilateral: Secondary | ICD-10-CM | POA: Diagnosis not present

## 2016-07-10 ENCOUNTER — Ambulatory Visit (INDEPENDENT_AMBULATORY_CARE_PROVIDER_SITE_OTHER): Payer: Medicare Other | Admitting: Orthopedic Surgery

## 2016-07-10 DIAGNOSIS — M25561 Pain in right knee: Secondary | ICD-10-CM

## 2016-07-10 DIAGNOSIS — Z96651 Presence of right artificial knee joint: Secondary | ICD-10-CM

## 2016-07-19 DIAGNOSIS — D1721 Benign lipomatous neoplasm of skin and subcutaneous tissue of right arm: Secondary | ICD-10-CM | POA: Diagnosis not present

## 2016-08-09 DIAGNOSIS — I10 Essential (primary) hypertension: Secondary | ICD-10-CM | POA: Diagnosis not present

## 2016-08-09 DIAGNOSIS — J441 Chronic obstructive pulmonary disease with (acute) exacerbation: Secondary | ICD-10-CM | POA: Diagnosis not present

## 2016-08-09 DIAGNOSIS — E782 Mixed hyperlipidemia: Secondary | ICD-10-CM | POA: Diagnosis not present

## 2016-08-09 DIAGNOSIS — Z23 Encounter for immunization: Secondary | ICD-10-CM | POA: Diagnosis not present

## 2016-08-09 DIAGNOSIS — Z125 Encounter for screening for malignant neoplasm of prostate: Secondary | ICD-10-CM | POA: Diagnosis not present

## 2016-09-02 ENCOUNTER — Telehealth (INDEPENDENT_AMBULATORY_CARE_PROVIDER_SITE_OTHER): Payer: Self-pay | Admitting: Orthopedic Surgery

## 2016-09-02 DIAGNOSIS — J441 Chronic obstructive pulmonary disease with (acute) exacerbation: Secondary | ICD-10-CM | POA: Diagnosis not present

## 2016-09-02 DIAGNOSIS — I10 Essential (primary) hypertension: Secondary | ICD-10-CM | POA: Diagnosis not present

## 2016-09-02 DIAGNOSIS — Z7722 Contact with and (suspected) exposure to environmental tobacco smoke (acute) (chronic): Secondary | ICD-10-CM | POA: Diagnosis not present

## 2016-09-02 NOTE — Telephone Encounter (Signed)
Rx request 

## 2016-09-03 NOTE — Telephone Encounter (Signed)
Per last note one time prescription for pain medicine.  No more pain medicine

## 2016-09-04 NOTE — Telephone Encounter (Signed)
IC pt and advised, he says he is having a lot of knee pain.  Requests an appointment with Marlou Sa for eval.  Appt made

## 2016-09-12 ENCOUNTER — Ambulatory Visit (INDEPENDENT_AMBULATORY_CARE_PROVIDER_SITE_OTHER): Payer: Medicare Other | Admitting: Orthopedic Surgery

## 2016-09-18 DIAGNOSIS — Z202 Contact with and (suspected) exposure to infections with a predominantly sexual mode of transmission: Secondary | ICD-10-CM | POA: Diagnosis not present

## 2016-09-18 DIAGNOSIS — J020 Streptococcal pharyngitis: Secondary | ICD-10-CM | POA: Diagnosis not present

## 2016-10-03 ENCOUNTER — Encounter: Payer: Self-pay | Admitting: Family Medicine

## 2016-11-09 ENCOUNTER — Emergency Department (HOSPITAL_COMMUNITY)
Admission: EM | Admit: 2016-11-09 | Discharge: 2016-11-09 | Disposition: A | Discharge status: Home / Self Care | Payer: Medicare Other | Attending: Emergency Medicine | Admitting: Emergency Medicine

## 2016-11-09 ENCOUNTER — Encounter (HOSPITAL_COMMUNITY): Payer: Self-pay

## 2016-11-09 ENCOUNTER — Emergency Department (HOSPITAL_COMMUNITY): Payer: Medicare Other

## 2016-11-09 DIAGNOSIS — F17210 Nicotine dependence, cigarettes, uncomplicated: Secondary | ICD-10-CM | POA: Insufficient documentation

## 2016-11-09 DIAGNOSIS — I10 Essential (primary) hypertension: Secondary | ICD-10-CM | POA: Insufficient documentation

## 2016-11-09 DIAGNOSIS — R69 Illness, unspecified: Secondary | ICD-10-CM

## 2016-11-09 DIAGNOSIS — Z79899 Other long term (current) drug therapy: Secondary | ICD-10-CM | POA: Insufficient documentation

## 2016-11-09 DIAGNOSIS — Z96651 Presence of right artificial knee joint: Secondary | ICD-10-CM | POA: Insufficient documentation

## 2016-11-09 DIAGNOSIS — J111 Influenza due to unidentified influenza virus with other respiratory manifestations: Secondary | ICD-10-CM | POA: Insufficient documentation

## 2016-11-09 DIAGNOSIS — R05 Cough: Secondary | ICD-10-CM | POA: Diagnosis present

## 2016-11-09 DIAGNOSIS — J441 Chronic obstructive pulmonary disease with (acute) exacerbation: Secondary | ICD-10-CM | POA: Insufficient documentation

## 2016-11-09 LAB — CBC WITH DIFFERENTIAL/PLATELET
BASOS PCT: 0 %
Basophils Absolute: 0 10*3/uL (ref 0.0–0.1)
EOS ABS: 0.2 10*3/uL (ref 0.0–0.7)
Eosinophils Relative: 3 %
HCT: 44.5 % (ref 39.0–52.0)
HEMOGLOBIN: 15.1 g/dL (ref 13.0–17.0)
Lymphocytes Relative: 41 %
Lymphs Abs: 2.4 10*3/uL (ref 0.7–4.0)
MCH: 31.2 pg (ref 26.0–34.0)
MCHC: 33.9 g/dL (ref 30.0–36.0)
MCV: 91.9 fL (ref 78.0–100.0)
MONOS PCT: 9 %
Monocytes Absolute: 0.5 10*3/uL (ref 0.1–1.0)
NEUTROS PCT: 47 %
Neutro Abs: 2.7 10*3/uL (ref 1.7–7.7)
PLATELETS: 278 10*3/uL (ref 150–400)
RBC: 4.84 MIL/uL (ref 4.22–5.81)
RDW: 13.3 % (ref 11.5–15.5)
WBC: 5.7 10*3/uL (ref 4.0–10.5)

## 2016-11-09 LAB — BASIC METABOLIC PANEL
Anion gap: 10 (ref 5–15)
BUN: 11 mg/dL (ref 6–20)
CALCIUM: 9.4 mg/dL (ref 8.9–10.3)
CO2: 24 mmol/L (ref 22–32)
CREATININE: 0.91 mg/dL (ref 0.61–1.24)
Chloride: 106 mmol/L (ref 101–111)
GFR calc non Af Amer: >60 mL/min (ref >60)
Glucose, Bld: 137 mg/dL — ABNORMAL HIGH (ref 65–99)
Potassium: 3.8 mmol/L (ref 3.5–5.1)
SODIUM: 140 mmol/L (ref 135–145)

## 2016-11-09 MED ORDER — PROMETHAZINE HCL 25 MG PO TABS: 25.0000 mg | ORAL_TABLET | Freq: Three times a day (TID) | ORAL | 0 refills | Status: DC | PRN

## 2016-11-09 MED ORDER — ONDANSETRON 4 MG PO TBDP
4.0000 mg | ORAL_TABLET | Freq: Once | ORAL | Status: DC
  Filled 2016-11-09: qty 1

## 2016-11-09 MED ORDER — IBUPROFEN 200 MG PO TABS
600.0000 mg | ORAL_TABLET | Freq: Once | ORAL | Status: AC
  Administered 2016-11-09: 600 mg via ORAL
  Filled 2016-11-09: qty 1

## 2016-11-09 MED ORDER — IBUPROFEN 600 MG PO TABS: 600.0000 mg | ORAL_TABLET | Freq: Three times a day (TID) | ORAL | 0 refills | Status: DC | PRN

## 2016-11-09 NOTE — ED Triage Notes (Signed)
Onset 1 week pt reports he has a lipoma on the back of his head and needs an xray.  Onset 2 days cough, chest congestion, runny nose.  No respiratory difficulties.

## 2016-11-09 NOTE — ED Notes (Signed)
Patient transported to X-ray 

## 2016-11-09 NOTE — ED Provider Notes (Signed)
[facility] DEPT Provider Note   CSN: [id number] Arrival date & time: 11/09/16  1432     History   Chief Complaint Chief Complaint  Patient presents with  . Cough  . Headache  . Hypertension    HPI Andrew [name] is a 51 y.o. male.  The history is provided by the patient. No language interpreter was used.  Cough  Associated symptoms include headaches.  Headache    Hypertension  Associated symptoms include headaches.    Andrew [name] is a 51 y.o. male who presents to the Emergency Department complaining of multiple complaints.  He reports one week of pain to the back of his head.  The pain is better when pressure is applied.  It is at the base of the skull.  Pain is waxing and waning.  No numbness in arms/legs.  No recent injuries.  He also complains Of cough for 3-4 days with chest congestion. He had a fever to 101 3 nights ago, now gone. Today he developed central abdominal discomfort with vomiting twice today. No dysuria. He has had multiple sick contacts at work with similar symptoms.  Past Medical History:  Diagnosis Date  . Anxiety   . Arthritis    right knee  . Back pain    occasionally  . Bipolar disorder ([place])    denies  . COPD (chronic obstructive pulmonary disease) ([place])   . Depression   . Dizziness   . Generalized headaches    history of migraines-last one 02/02/14  . GERD (gastroesophageal reflux disease)    takes Prilosec daily as needed  . Headache   . Hemorrhoids   . History of bronchitis    1 month ago and treated with Amoxicillin and given an inhaler;takes Prenisone  . Hyperlipidemia    takes Zetia daily  . Hypertension    takes HCTZ daily  . Joint pain   . Joint swelling   . Pneumonia    hx of 2 yrs ago  . Polysubstance abuse   . Schizoaffective disorder ([place])    denies  . Tuberculosis    treated for 1 year  . Umbilical hernia     Patient Active Problem List   Diagnosis Date Noted  . Arthritis of knee, degenerative 01/24/2015  .  Right knee pain 01/07/2014  . Concern about STD in male without diagnosis 01/06/2014  . Dysuria 12/09/2013  . High cholesterol 10/25/2013  . GERD (gastroesophageal reflux disease) 09/20/2013  . History of positive PPD 07/17/2012  . Cocaine abuse 04/02/2012  . Rhabdomyolysis 04/02/2012  . Hernia of abdominal wall 01/05/2011  . ERECTILE DYSFUNCTION, PSYCHOGENIC 02/13/2010  . BIPOLAR DISORDER UNSPECIFIED 01/31/2009  . TOBACCO ABUSE 01/31/2009  . ELEVATED BP READING WITHOUT DX HYPERTENSION 01/31/2009    Past Surgical History:  Procedure Laterality Date  . ESOPHAGOGASTRODUODENOSCOPY    . JOINT REPLACEMENT    . KNEE ARTHROSCOPY WITH MEDIAL MENISECTOMY Right 03/01/2014   Procedure: KNEE ARTHROSCOPY WITH MEDIAL MENISECTOMY;  Surgeon: Meredith Pel, MD;  Location: [facility];  Service: Orthopedics;  Laterality: Right;  . LIPOMA EXCISION  ~ 2010   "back left side of my head"  . PARTIAL KNEE ARTHROPLASTY Right 01/24/2015   Procedure: RIGHT KNEE MEDIAL COMPARTMENT REPLACEMENT VS TOTAL KNEE ARTHROPLASTY.;  Surgeon: Meredith Pel, MD;  Location: [facility];  Service: Orthopedics;  Laterality: Right;  RIGHT KNEE MEDIAL COMPARTMENT REPLACEMENT VS TOTAL KNEE ARTHROPLASTY.  . TOTAL KNEE ARTHROPLASTY Right        Home Medications  Prior to Admission medications   Medication Sig Start Date End Date Taking? Authorizing Provider  diphenhydramine-acetaminophen (TYLENOL PM) 25-500 MG TABS tablet Take 1 tablet by mouth at bedtime as needed.   Yes Historical Provider, MD  ibuprofen (ADVIL,MOTRIN) 600 MG tablet Take 1 tablet (600 mg total) by mouth every 8 (eight) hours as needed for fever or moderate pain. 11/09/16   Quintella Reichert, MD  promethazine (PHENERGAN) 25 MG tablet Take 1 tablet (25 mg total) by mouth every 8 (eight) hours as needed for nausea or vomiting. 11/09/16   Quintella Reichert, MD    Family History Family History  Problem Relation Age of Onset  . Hypertension Mother   . Cancer Mother   .  Heart disease Father   . Heart attack Father 62  . Hyperlipidemia Father   . Hypertension Father   . Diabetes Sister   . Diabetes Brother     Social History Social History  Substance Use Topics  . Smoking status: Current Some Day Smoker    Types: Cigarettes  . Smokeless tobacco: Never Used  . Alcohol use Yes     Comment: occasionally      Allergies   Mushroom extract complex and Penicillins   Review of Systems Review of Systems  Respiratory: Positive for cough.   Neurological: Positive for headaches.  All other systems reviewed and are negative.    Physical Exam Updated Vital Signs BP 130/78   Pulse 62   Temp 99 F (37.2 C)   Resp 14   Ht 5' 7.5" (1.715 m)   Wt 225 lb (102.1 kg)   SpO2 95%   BMI 34.72 kg/m   Physical Exam  Constitutional: He is oriented to person, place, and time. He appears well-developed and well-nourished.  HENT:  Head: Normocephalic and atraumatic.  Eyes: Pupils are equal, round, and reactive to light.  Neck: Neck supple.  No masses  Cardiovascular: Normal rate and regular rhythm.   No murmur heard. Pulmonary/Chest: Effort normal and breath sounds normal. No respiratory distress.  Abdominal: Soft. There is no tenderness. There is no rebound and no guarding.  Musculoskeletal: He exhibits no edema or tenderness.  Neurological: He is alert and oriented to person, place, and time.  No facial asymmetry.  5/5 strength in all four extremities.    Skin: Skin is warm and dry.  Psychiatric: He has a normal mood and affect. His behavior is normal.  Nursing note and vitals reviewed.    ED Treatments / Results  Labs (all labs ordered are listed, but only abnormal results are displayed) Labs Reviewed  BASIC METABOLIC PANEL - Abnormal; Notable for the following:       Result Value   Glucose, Bld 137 (*)    All other components within normal limits  CBC WITH DIFFERENTIAL/PLATELET    EKG  EKG Interpretation  Date/Time:  Saturday  November 09 2016 19:12:32 EST Ventricular Rate:  76 PR Interval:    QRS Duration: 86 QT Interval:  361 QTC Calculation: 406 R Axis:   -11 Text Interpretation:  Sinus rhythm Probable anteroseptal infarct, old Confirmed by Hazle Coca [id number] on 11/09/2016 7:57:09 PM       Radiology Dg Chest 2 View  Result Date: 11/09/2016 CLINICAL DATA:  Onset 1 week pt reports he has a lipoma on the back of his head and needs an xray. Onset 2 days cough, chest congestion, runny nose. No respiratory difficulties. Hx HTN, COPD, TB EXAM: CHEST  2 VIEW COMPARISON:  05/19/2016  FINDINGS: Heart size is normal. Emphysematous changes are identified in the apices, right greater than left and stable in appearance. There are no focal consolidations or pleural effusions. No pulmonary edema. IMPRESSION: No evidence for acute cardiopulmonary abnormality.  Emphysema. Electronically Signed   By: Nolon Nations M.D.   On: 11/09/2016 20:06    Procedures Procedures (including critical care time)  Medications Ordered in ED Medications  ibuprofen (ADVIL,MOTRIN) tablet 600 mg (600 mg Oral Given 11/09/16 2004)     Initial Impression / Assessment and Plan / ED Course  I have reviewed the triage vital signs and the nursing notes.  Pertinent labs & imaging results that were available during my care of the patient were reviewed by me and considered in my medical decision making (see chart for details).     Pt here with multiple complaints including cough and pain at base of skull.  He has tenderness on his posterior neck with no masses or deformities.  He is nontoxic on exam, well hydrated with no respiratory distress.  There is no evidence of serious bacterial infection and current clinical picture is consistent with flu.  He is out of the window for tamiflu treatment given his duration of symptoms.  Counseled patient on home care with rest, oral fluid hydration, tylenol/ibuprofen prn fever or body aches with outpatient follow up  and return precautions.    Final Clinical Impressions(s) / ED Diagnoses   Final diagnoses:  Influenza-like illness    New Prescriptions Discharge Medication List as of 11/09/2016  8:42 PM    START taking these medications   Details  promethazine (PHENERGAN) 25 MG tablet Take 1 tablet (25 mg total) by mouth every 8 (eight) hours as needed for nausea or vomiting., Starting Sat 11/09/2016, Print         Quintella Reichert, MD 11/10/16 1150

## 2016-11-09 NOTE — ED Notes (Signed)
Pt understood dc material. NAD noted. Scripts given at dc 

## 2016-11-20 DIAGNOSIS — J069 Acute upper respiratory infection, unspecified: Secondary | ICD-10-CM | POA: Diagnosis not present

## 2016-11-20 DIAGNOSIS — R05 Cough: Secondary | ICD-10-CM | POA: Diagnosis not present

## 2016-11-20 DIAGNOSIS — J209 Acute bronchitis, unspecified: Secondary | ICD-10-CM | POA: Diagnosis not present

## 2016-11-20 DIAGNOSIS — N3000 Acute cystitis without hematuria: Secondary | ICD-10-CM | POA: Diagnosis not present
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
## 2017-08-10 IMAGING — CR DG PORTABLE PELVIS
1 series · 1 of 1 positions shown · non-contrast
Comparison: None.

CLINICAL DATA: Pain after fall from second story window.

EXAM:
PORTABLE PELVIS 1-2 VIEWS

[AP]
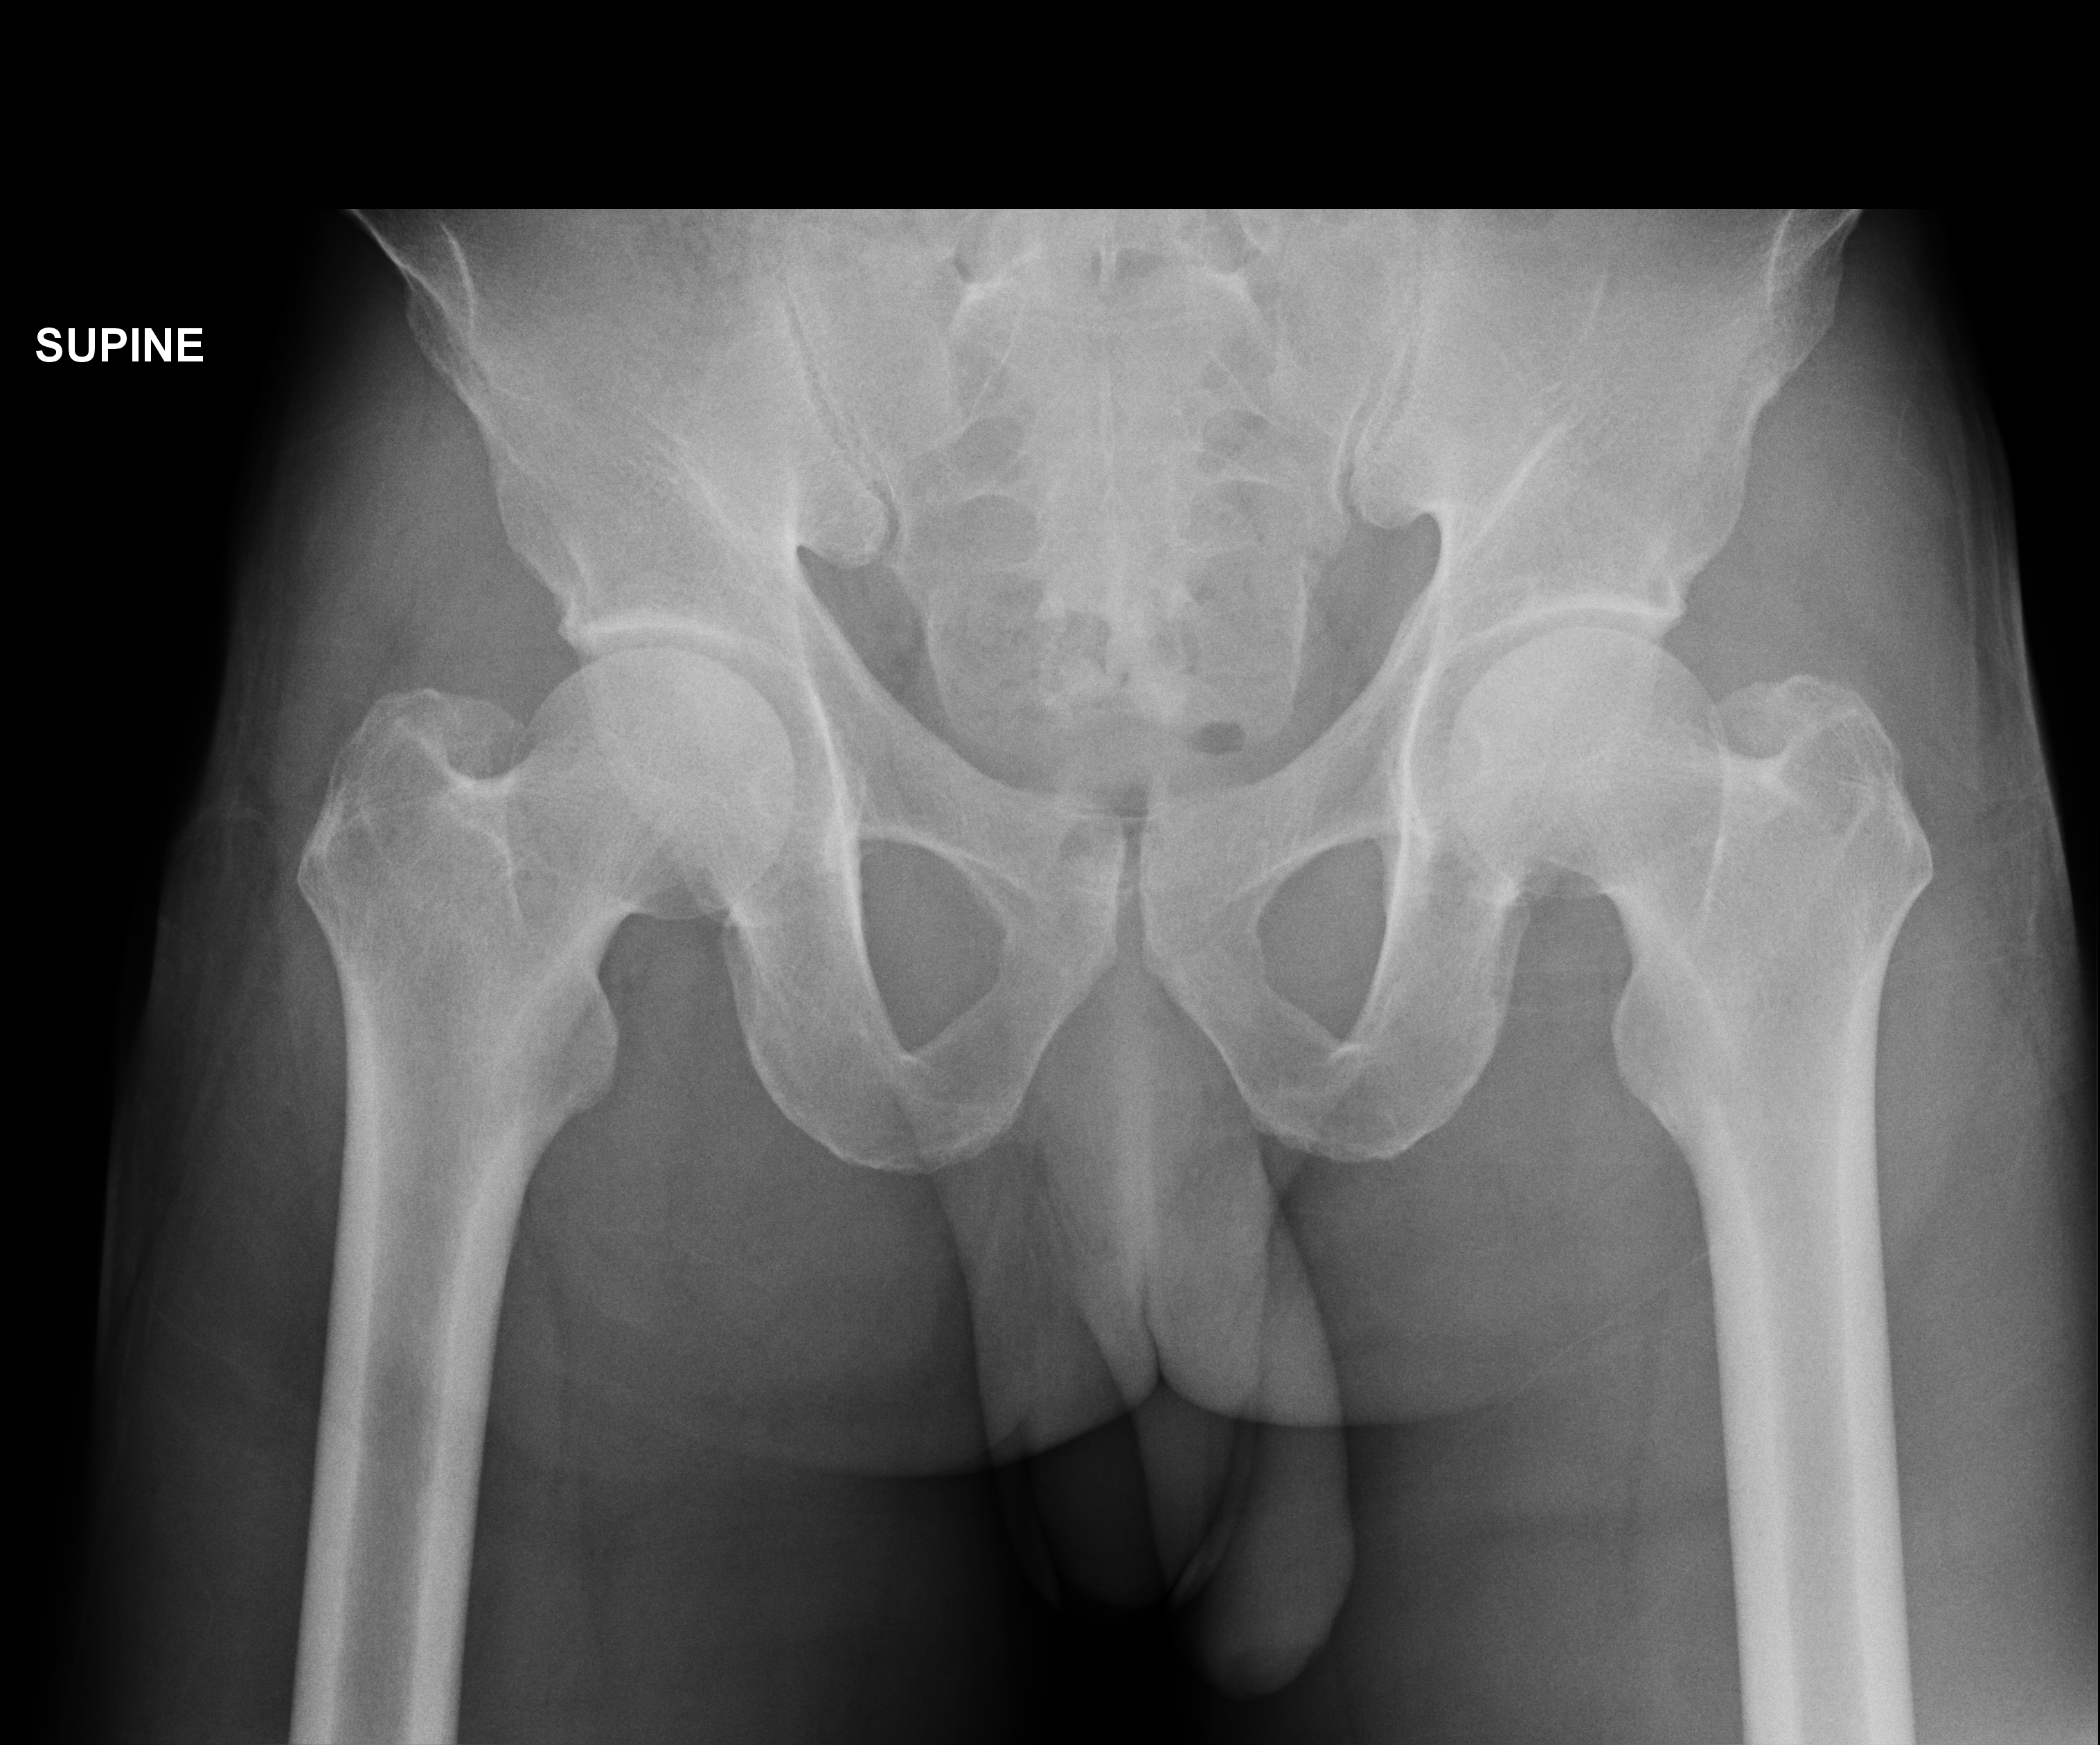

[1 of 1 positions shown; findings below may reference images not displayed]

FINDINGS: There is no evidence of pelvic fracture or diastasis. No pelvic bone
lesions are seen.
IMPRESSION: Negative.
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
## 2017-09-27 ENCOUNTER — Inpatient Hospital Stay (HOSPITAL_COMMUNITY)
Admission: EM | Admit: 2017-09-27 | Discharge: 2017-10-02 | DRG: 917 | Disposition: A | Discharge status: Home / Self Care | Payer: Medicare Other | Attending: Internal Medicine | Admitting: Internal Medicine

## 2017-09-27 ENCOUNTER — Other Ambulatory Visit: Payer: Self-pay

## 2017-09-27 DIAGNOSIS — R6510 Systemic inflammatory response syndrome (SIRS) of non-infectious origin without acute organ dysfunction: Secondary | ICD-10-CM | POA: Diagnosis present

## 2017-09-27 DIAGNOSIS — J690 Pneumonitis due to inhalation of food and vomit: Secondary | ICD-10-CM | POA: Diagnosis not present

## 2017-09-27 DIAGNOSIS — T405X1A Poisoning by cocaine, accidental (unintentional), initial encounter: Secondary | ICD-10-CM | POA: Diagnosis not present

## 2017-09-27 DIAGNOSIS — T401X1A Poisoning by heroin, accidental (unintentional), initial encounter: Secondary | ICD-10-CM

## 2017-09-27 DIAGNOSIS — G9340 Encephalopathy, unspecified: Secondary | ICD-10-CM | POA: Diagnosis not present

## 2017-09-27 DIAGNOSIS — K7200 Acute and subacute hepatic failure without coma: Secondary | ICD-10-CM | POA: Diagnosis not present

## 2017-09-27 DIAGNOSIS — T50901A Poisoning by unspecified drugs, medicaments and biological substances, accidental (unintentional), initial encounter: Secondary | ICD-10-CM | POA: Diagnosis present

## 2017-09-27 DIAGNOSIS — D72829 Elevated white blood cell count, unspecified: Secondary | ICD-10-CM

## 2017-09-27 DIAGNOSIS — N179 Acute kidney failure, unspecified: Secondary | ICD-10-CM | POA: Diagnosis not present

## 2017-09-27 DIAGNOSIS — F17210 Nicotine dependence, cigarettes, uncomplicated: Secondary | ICD-10-CM | POA: Diagnosis present

## 2017-09-27 DIAGNOSIS — F40240 Claustrophobia: Secondary | ICD-10-CM | POA: Diagnosis present

## 2017-09-27 DIAGNOSIS — K759 Inflammatory liver disease, unspecified: Secondary | ICD-10-CM

## 2017-09-27 DIAGNOSIS — E860 Dehydration: Secondary | ICD-10-CM | POA: Diagnosis present

## 2017-09-27 DIAGNOSIS — E162 Hypoglycemia, unspecified: Secondary | ICD-10-CM | POA: Diagnosis present

## 2017-09-27 LAB — CBC WITH DIFFERENTIAL/PLATELET
BASOS ABS: 0 10*3/uL (ref 0.0–0.1)
Basophils Relative: 0 %
EOS PCT: 1 %
Eosinophils Absolute: 0.1 10*3/uL (ref 0.0–0.7)
HCT: 46.3 % (ref 39.0–52.0)
Hemoglobin: 15 g/dL (ref 13.0–17.0)
LYMPHS PCT: 13 %
Lymphs Abs: 1.4 10*3/uL (ref 0.7–4.0)
MCH: 31.6 pg (ref 26.0–34.0)
MCHC: 32.4 g/dL (ref 30.0–36.0)
MCV: 97.7 fL (ref 78.0–100.0)
MONO ABS: 0.9 10*3/uL (ref 0.1–1.0)
MONOS PCT: 8 %
Neutro Abs: 8.6 10*3/uL — ABNORMAL HIGH (ref 1.7–7.7)
Neutrophils Relative %: 78 %
PLATELETS: 288 10*3/uL (ref 150–400)
RBC: 4.74 MIL/uL (ref 4.22–5.81)
RDW: 13.2 % (ref 11.5–15.5)
WBC: 11.1 10*3/uL — ABNORMAL HIGH (ref 4.0–10.5)

## 2017-09-27 LAB — URINALYSIS, ROUTINE W REFLEX MICROSCOPIC
BACTERIA UA: NONE SEEN
Bilirubin Urine: NEGATIVE
GLUCOSE, UA: NEGATIVE mg/dL
HGB URINE DIPSTICK: NEGATIVE
KETONES UR: 5 mg/dL — AB
LEUKOCYTES UA: NEGATIVE
NITRITE: NEGATIVE
PROTEIN: 30 mg/dL — AB
Specific Gravity, Urine: 1.02 (ref 1.005–1.030)
pH: 5 (ref 5.0–8.0)

## 2017-09-27 LAB — COMPREHENSIVE METABOLIC PANEL
ALK PHOS: 85 U/L (ref 38–126)
ALT: 467 U/L — AB (ref 17–63)
ANION GAP: 13 (ref 5–15)
AST: 467 U/L — ABNORMAL HIGH (ref 15–41)
Albumin: 4 g/dL (ref 3.5–5.0)
BILIRUBIN TOTAL: 1.2 mg/dL (ref 0.3–1.2)
BUN: 16 mg/dL (ref 6–20)
CALCIUM: 9.1 mg/dL (ref 8.9–10.3)
CO2: 27 mmol/L (ref 22–32)
CREATININE: 1.96 mg/dL — AB (ref 0.61–1.24)
Chloride: 101 mmol/L (ref 101–111)
GFR calc non Af Amer: 23 mL/min — ABNORMAL LOW (ref >60)
GFR, EST AFRICAN AMERICAN: 27 mL/min — AB (ref >60)
Glucose, Bld: 46 mg/dL — ABNORMAL LOW (ref 65–99)
Potassium: 4.5 mmol/L (ref 3.5–5.1)
Sodium: 141 mmol/L (ref 135–145)
TOTAL PROTEIN: 7.8 g/dL (ref 6.5–8.1)

## 2017-09-27 LAB — RAPID URINE DRUG SCREEN, HOSP PERFORMED
Amphetamines: NOT DETECTED
Barbiturates: NOT DETECTED
Benzodiazepines: NOT DETECTED
Cocaine: POSITIVE — AB
OPIATES: NOT DETECTED
Tetrahydrocannabinol: NOT DETECTED

## 2017-09-27 LAB — SALICYLATE LEVEL: Salicylate Lvl: <7 mg/dL (ref 2.8–30.0)

## 2017-09-27 LAB — ACETAMINOPHEN LEVEL: Acetaminophen (Tylenol), Serum: <10 ug/mL — ABNORMAL LOW (ref 10–30)

## 2017-09-27 LAB — ETHANOL

## 2017-09-27 LAB — I-STAT TROPONIN, ED: Troponin i, poc: 0.01 ng/mL (ref 0.00–0.08)

## 2017-09-27 MED ORDER — SODIUM CHLORIDE 0.9 % IV BOLUS (SEPSIS)
1000.0000 mL | Freq: Once | INTRAVENOUS | Status: AC
  Administered 2017-09-27: 1000 mL via INTRAVENOUS

## 2017-09-27 MED ORDER — NALOXONE HCL 0.4 MG/ML IJ SOLN
1.0000 mg/h | INTRAVENOUS | Status: DC
  Administered 2017-09-27: 1 mg/h via INTRAVENOUS
  Filled 2017-09-27: qty 4

## 2017-09-27 MED ORDER — NALOXONE HCL 2 MG/2ML IJ SOSY
2.0000 mg | PREFILLED_SYRINGE | Freq: Once | INTRAMUSCULAR | Status: AC
  Administered 2017-09-27: 2 mg via INTRAVENOUS
  Filled 2017-09-27: qty 2

## 2017-09-27 MED ORDER — NALOXONE HCL 2 MG/2ML IJ SOSY
2.0000 mg | PREFILLED_SYRINGE | Freq: Once | INTRAMUSCULAR | Status: AC
  Administered 2017-09-27: 2 mg via INTRAVENOUS

## 2017-09-27 NOTE — Progress Notes (Signed)
Pt. Brought in and is not breathing.  Pt. Manually ventilated with 100% fio2 via ambu bag until pt. Had spontaneous respirations.  Pt. Had good saturations at 99-100% with stable vital signs.  Pt. Given Narcan and resumed spontaneous respirations. Placed on NRB mask per MD verbal orders.
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
## 2017-09-27 NOTE — ED Notes (Signed)
Bed: RESB Expected date:  Expected time:  Means of arrival:  Comments: OD 

## 2017-09-27 NOTE — ED Triage Notes (Signed)
Pt BIB a friend. Patient had been unresponsive after being asleep for over an hour. Heroin use suspected. Patient is not A&O, responsive to pain.

## 2017-09-28 ENCOUNTER — Encounter (HOSPITAL_COMMUNITY): Payer: Self-pay | Admitting: Internal Medicine

## 2017-09-28 ENCOUNTER — Inpatient Hospital Stay (HOSPITAL_COMMUNITY): Payer: Medicare Other

## 2017-09-28 DIAGNOSIS — R7989 Other specified abnormal findings of blood chemistry: Secondary | ICD-10-CM | POA: Diagnosis not present

## 2017-09-28 DIAGNOSIS — R6510 Systemic inflammatory response syndrome (SIRS) of non-infectious origin without acute organ dysfunction: Secondary | ICD-10-CM | POA: Diagnosis not present

## 2017-09-28 DIAGNOSIS — A419 Sepsis, unspecified organism: Secondary | ICD-10-CM | POA: Diagnosis not present

## 2017-09-28 DIAGNOSIS — R4182 Altered mental status, unspecified: Secondary | ICD-10-CM

## 2017-09-28 DIAGNOSIS — T401X1A Poisoning by heroin, accidental (unintentional), initial encounter: Secondary | ICD-10-CM | POA: Diagnosis not present

## 2017-09-28 DIAGNOSIS — D72829 Elevated white blood cell count, unspecified: Secondary | ICD-10-CM | POA: Diagnosis present

## 2017-09-28 DIAGNOSIS — R740 Nonspecific elevation of levels of transaminase and lactic acid dehydrogenase [LDH]: Secondary | ICD-10-CM | POA: Diagnosis not present

## 2017-09-28 DIAGNOSIS — R945 Abnormal results of liver function studies: Secondary | ICD-10-CM | POA: Diagnosis not present

## 2017-09-28 DIAGNOSIS — E162 Hypoglycemia, unspecified: Secondary | ICD-10-CM | POA: Diagnosis present

## 2017-09-28 DIAGNOSIS — T50901A Poisoning by unspecified drugs, medicaments and biological substances, accidental (unintentional), initial encounter: Secondary | ICD-10-CM | POA: Diagnosis present

## 2017-09-28 DIAGNOSIS — R918 Other nonspecific abnormal finding of lung field: Secondary | ICD-10-CM | POA: Diagnosis not present

## 2017-09-28 DIAGNOSIS — E860 Dehydration: Secondary | ICD-10-CM | POA: Diagnosis present

## 2017-09-28 DIAGNOSIS — R6520 Severe sepsis without septic shock: Secondary | ICD-10-CM | POA: Diagnosis not present

## 2017-09-28 DIAGNOSIS — T405X4D Poisoning by cocaine, undetermined, subsequent encounter: Secondary | ICD-10-CM | POA: Diagnosis not present

## 2017-09-28 DIAGNOSIS — N179 Acute kidney failure, unspecified: Secondary | ICD-10-CM

## 2017-09-28 DIAGNOSIS — F40240 Claustrophobia: Secondary | ICD-10-CM | POA: Diagnosis present

## 2017-09-28 DIAGNOSIS — T405X1A Poisoning by cocaine, accidental (unintentional), initial encounter: Secondary | ICD-10-CM | POA: Diagnosis not present

## 2017-09-28 DIAGNOSIS — K7200 Acute and subacute hepatic failure without coma: Secondary | ICD-10-CM | POA: Diagnosis present

## 2017-09-28 DIAGNOSIS — G92 Toxic encephalopathy: Secondary | ICD-10-CM

## 2017-09-28 DIAGNOSIS — K759 Inflammatory liver disease, unspecified: Secondary | ICD-10-CM | POA: Diagnosis not present

## 2017-09-28 DIAGNOSIS — F17210 Nicotine dependence, cigarettes, uncomplicated: Secondary | ICD-10-CM | POA: Diagnosis present

## 2017-09-28 DIAGNOSIS — G9340 Encephalopathy, unspecified: Secondary | ICD-10-CM | POA: Diagnosis present

## 2017-09-28 DIAGNOSIS — E869 Volume depletion, unspecified: Secondary | ICD-10-CM | POA: Diagnosis not present

## 2017-09-28 DIAGNOSIS — J690 Pneumonitis due to inhalation of food and vomit: Secondary | ICD-10-CM | POA: Diagnosis present

## 2017-09-28 DIAGNOSIS — K7689 Other specified diseases of liver: Secondary | ICD-10-CM | POA: Diagnosis not present

## 2017-09-28 LAB — COMPREHENSIVE METABOLIC PANEL
ALT: 1548 U/L — ABNORMAL HIGH (ref 17–63)
ALT: UNDETERMINED U/L (ref 17–63)
AST: 2001 U/L — ABNORMAL HIGH (ref 15–41)
AST: UNDETERMINED U/L (ref 15–41)
Albumin: 3.3 g/dL — ABNORMAL LOW (ref 3.5–5.0)
Albumin: UNDETERMINED g/dL (ref 3.5–5.0)
Alkaline Phosphatase: 71 U/L (ref 38–126)
Alkaline Phosphatase: 74 U/L (ref 38–126)
Anion gap: 7 (ref 5–15)
Anion gap: 7 (ref 5–15)
BUN: 18 mg/dL (ref 6–20)
BUN: UNDETERMINED mg/dL (ref 6–20)
CHLORIDE: 104 mmol/L (ref 101–111)
CO2: 25 mmol/L (ref 22–32)
CO2: 25 mmol/L (ref 22–32)
CREATININE: UNDETERMINED mg/dL (ref 0.61–1.24)
Calcium: 8.3 mg/dL — ABNORMAL LOW (ref 8.9–10.3)
Calcium: 8.5 mg/dL — ABNORMAL LOW (ref 8.9–10.3)
Chloride: 106 mmol/L (ref 101–111)
Creatinine, Ser: 1.29 mg/dL — ABNORMAL HIGH (ref 0.61–1.24)
GFR calc Af Amer: >60 mL/min (ref >60)
GFR calc non Af Amer: >60 mL/min (ref >60)
Glucose, Bld: 71 mg/dL (ref 65–99)
Glucose, Bld: 69 mg/dL (ref 65–99)
POTASSIUM: 4.2 mmol/L (ref 3.5–5.1)
Potassium: 4.3 mmol/L (ref 3.5–5.1)
SODIUM: 136 mmol/L (ref 135–145)
Sodium: 138 mmol/L (ref 135–145)
Total Bilirubin: 0.8 mg/dL (ref 0.3–1.2)
Total Bilirubin: 0.8 mg/dL (ref 0.3–1.2)
Total Protein: 6.3 g/dL — ABNORMAL LOW (ref 6.5–8.1)
Total Protein: 6.5 g/dL (ref 6.5–8.1)

## 2017-09-28 LAB — CBC
HCT: 43.6 % (ref 39.0–52.0)
Hemoglobin: 14.8 g/dL (ref 13.0–17.0)
MCH: 31.7 pg (ref 26.0–34.0)
MCHC: 33.9 g/dL (ref 30.0–36.0)
MCV: 93.4 fL (ref 78.0–100.0)
PLATELETS: 240 10*3/uL (ref 150–400)
RBC: 4.67 MIL/uL (ref 4.22–5.81)
RDW: 13.4 % (ref 11.5–15.5)
WBC: 10.5 10*3/uL (ref 4.0–10.5)

## 2017-09-28 LAB — CBC WITH DIFFERENTIAL/PLATELET
Basophils Absolute: 0 10*3/uL (ref 0.0–0.1)
Basophils Relative: 0 %
Eosinophils Absolute: 0 10*3/uL (ref 0.0–0.7)
Eosinophils Relative: 0 %
HCT: 39.4 % (ref 39.0–52.0)
Hemoglobin: 13.1 g/dL (ref 13.0–17.0)
Lymphocytes Relative: 13 %
Lymphs Abs: 2 10*3/uL (ref 0.7–4.0)
MCH: 31.1 pg (ref 26.0–34.0)
MCHC: 33.2 g/dL (ref 30.0–36.0)
MCV: 93.6 fL (ref 78.0–100.0)
Monocytes Absolute: 1 10*3/uL (ref 0.1–1.0)
Monocytes Relative: 7 %
Neutro Abs: 12.3 10*3/uL — ABNORMAL HIGH (ref 1.7–7.7)
Neutrophils Relative %: 80 %
Platelets: 346 10*3/uL (ref 150–400)
RBC: 4.21 MIL/uL — ABNORMAL LOW (ref 4.22–5.81)
RDW: 13.4 % (ref 11.5–15.5)
WBC: 15.3 10*3/uL — ABNORMAL HIGH (ref 4.0–10.5)

## 2017-09-28 LAB — PROTIME-INR
INR: 1.3
Prothrombin Time: 16.1 seconds — ABNORMAL HIGH (ref 11.4–15.2)

## 2017-09-28 LAB — CBG MONITORING, ED
GLUCOSE-CAPILLARY: 86 mg/dL (ref 65–99)
Glucose-Capillary: 70 mg/dL (ref 65–99)

## 2017-09-28 LAB — MRSA PCR SCREENING: MRSA by PCR: NEGATIVE

## 2017-09-28 LAB — CK: Total CK: 260 U/L (ref 49–397)

## 2017-09-28 LAB — GLUCOSE, CAPILLARY: GLUCOSE-CAPILLARY: 105 mg/dL — AB (ref 65–99)

## 2017-09-28 LAB — AMMONIA: Ammonia: 43 umol/L — ABNORMAL HIGH (ref 9–35)

## 2017-09-28 MED ORDER — DEXTROSE 5 % IV SOLN
1.0000 g | INTRAVENOUS | Status: DC
  Administered 2017-09-29 – 2017-10-02 (×4): 1 g via INTRAVENOUS
  Filled 2017-09-28 (×5): qty 10

## 2017-09-28 MED ORDER — VANCOMYCIN HCL 10 G IV SOLR
1500.0000 mg | Freq: Once | INTRAVENOUS | Status: AC
  Administered 2017-09-28: 1500 mg via INTRAVENOUS
  Filled 2017-09-28: qty 1500

## 2017-09-28 MED ORDER — DEXTROSE 50 % IV SOLN
INTRAVENOUS | Status: AC
  Administered 2017-09-28: 25 mL via INTRAVENOUS
  Filled 2017-09-28: qty 50

## 2017-09-28 MED ORDER — LACTATED RINGERS IV BOLUS (SEPSIS)
1000.0000 mL | Freq: Once | INTRAVENOUS | Status: AC
  Administered 2017-09-28: 1000 mL via INTRAVENOUS

## 2017-09-28 MED ORDER — DEXTROSE-NACL 5-0.9 % IV SOLN
INTRAVENOUS | Status: DC
  Administered 2017-09-30 – 2017-10-02 (×3): via INTRAVENOUS

## 2017-09-28 MED ORDER — M.V.I. ADULT IV INJ
INJECTION | Freq: Once | INTRAVENOUS | Status: AC
  Administered 2017-09-28: 15:00:00 via INTRAVENOUS
  Filled 2017-09-28: qty 1000

## 2017-09-28 MED ORDER — LEVOFLOXACIN IN D5W 750 MG/150ML IV SOLN
750.0000 mg | Freq: Once | INTRAVENOUS | Status: AC
  Administered 2017-09-28: 750 mg via INTRAVENOUS
  Filled 2017-09-28: qty 150

## 2017-09-28 MED ORDER — METRONIDAZOLE IN NACL 5-0.79 MG/ML-% IV SOLN
500.0000 mg | Freq: Three times a day (TID) | INTRAVENOUS | Status: DC
  Administered 2017-09-28 – 2017-10-02 (×12): 500 mg via INTRAVENOUS
  Filled 2017-09-28 (×14): qty 100

## 2017-09-28 MED ORDER — LEVOFLOXACIN IN D5W 750 MG/150ML IV SOLN: 750.0000 mg | INTRAVENOUS | Status: DC

## 2017-09-28 MED ORDER — DEXTROSE 50 % IV SOLN
25.0000 mL | Freq: Once | INTRAVENOUS | Status: AC
  Administered 2017-09-28: 25 mL via INTRAVENOUS

## 2017-09-28 MED ORDER — HEPARIN SODIUM (PORCINE) 5000 UNIT/ML IJ SOLN
5000.0000 [IU] | Freq: Two times a day (BID) | INTRAMUSCULAR | Status: DC
  Administered 2017-09-28 – 2017-09-29 (×2): 5000 [IU] via SUBCUTANEOUS
  Filled 2017-09-28 (×5): qty 1

## 2017-09-28 MED ORDER — VANCOMYCIN HCL IN DEXTROSE 750-5 MG/150ML-% IV SOLN
750.0000 mg | Freq: Two times a day (BID) | INTRAVENOUS | Status: DC
Start: 2017-09-29 — End: 2017-09-30
  Administered 2017-09-29 – 2017-09-30 (×3): 750 mg via INTRAVENOUS
  Filled 2017-09-28 (×4): qty 150

## 2017-09-28 MED ORDER — ENSURE ENLIVE PO LIQD
237.0000 mL | Freq: Two times a day (BID) | ORAL | Status: DC
  Administered 2017-09-28 – 2017-10-02 (×4): 237 mL via ORAL

## 2017-09-28 NOTE — Progress Notes (Signed)
Pt brought to floor from ED via stretcher.  Pt able to get up and walk off of stretcher stating he has to use the BR.  With walking pt became nauseated and vomited large amount.  Pt slight unsteady of feet but refuses RN assist to support safety.  Education provided and pt continues to refuse safety interventions to prevent fall.

## 2017-09-28 NOTE — Progress Notes (Signed)
Pharmacy Antibiotic Note  Andrew [name] is a 51 y.o. male admitted on 09/27/2017 with overdose, leukocytosis.  Pharmacy has been consulted for Vancomycin dosing.  PCN allergy noted- he has tolerated cephalosporins in the past. Discussed with MD- change Levaquin to Rocephin + Flagyl.      09/28/2017:  Afebrile  Leukocytosis - increased since admit  Renal function improving; Scr 1.29 (est CrCl ~49ml/min)  UA negative  CXR+ infiltrate- possible aspiration  Plan: Vancomycin 1500mg  IV x1 then Vancomycin 750mg  IV q12h (target AUC 400-500) Check MRSA PCR F/U renal function & cx data Check Vanc levels at steady state if continues Rocephin & Flagyl per MD  Height: 5\' 7"  (170.2 cm) Weight: 185 lb (83.9 kg) IBW/kg (Calculated) : 66.1  No data recorded.  Recent Labs  Lab 09/27/17 2248 09/28/17 0846  WBC 11.1* 15.3*  CREATININE 1.96* 1.29*    Estimated Creatinine Clearance: 69.4 mL/min (A) (by C-G formula based on SCr of 1.29 mg/dL (H)).    Allergies  Allergen Reactions  . Penicillins Hives    Has patient had a PCN reaction causing immediate rash, facial/tongue/throat swelling, SOB or lightheadedness with hypotension: no Has patient had a PCN reaction causing severe rash involving mucus membranes or skin necrosis: no Has patient had a PCN reaction that required hospitalization: no Has patient had a PCN reaction occurring within the last 10 years: yes If all of the above answers are "NO", then may proceed with Cephalosporin use.    Antimicrobials this admission: 12/23 Levaquin x1 in ED 12/24 Rocephin >>  12/23 Flagyl >>  12/23 Vanc>>  Dose adjustments this admission:  Microbiology results: 12/23 BCx:  12/23 Sputum:   12/23 MRSA PCR:   Thank you for allowing pharmacy to be a part of this patient's care.  Biagio Borg 09/28/2017 10:35 AM

## 2017-09-28 NOTE — ED Notes (Signed)
Spoke with Jonelle Sidle, MD about changing patient's admission status. [facility] agreeable to changing patient bed. Awaiting new orders. ICU updated on status.

## 2017-09-28 NOTE — Progress Notes (Signed)
[place] to Dr Marthenia Rolling  R/t difficulity w/ labs canceled trop and other labs for now

## 2017-09-28 NOTE — Consult Note (Addendum)
Critical care consulted by Dr. Darl Householder for evaluation for admission to the critical care service for encephalopathy suspected due to opioid intoxication on a Narcan drip.  At the time of my evaluation Andrew [name] is alert lying in the ED stretcher tracking me with his eyes as I enter the room.  He is on 1 Narcan per hour.  He is oriented and able to express that he does not have chest pain shortness of breath nausea vomiting abdominal pain.  He reports that he has to urinate.  I explained that he was found unresponsive in a car and asked him what he was doing that led up to that event and he called me he could not stand the question I was asking him.  I tried rephrasing it and he continued to expand and could not understand what I was saying.  We were able to have conversation where he understood what I was saying catheter at this point.  He is on the monitor he is afebrile with  adequate stable hemodynamic parameters including 98% on room air.  He appears to have no focal deficits his lungs are clear his heart sounds are normal his abdomen is soft and nontender moves all extremities.  His lab work is notable for elevated creatinine with an unknown baseline and elevated LFTs in the 400s.  In the mild leukocytosis and undetectable troponin undetectable alcohol, acetaminophen and salicylate.  His glucose was reported at 46, repeat POC 80s, pending 12.5gm D50.  Urinalysis notable for ketones and drug screen is notable for cocaine positive and opiate negative.  EKG appears nonischemic.  Chest x-ray not obtained.  Problems 1.encephalopathy - toxic metabolic 2. Hypoglycemia 3. Cocaine positive UDS 4. Bystander reported opiod use, r/o opiod OD 5. Hepatitis  Recs 1. Do not rec ICU.  Suggest SDU w/hospitalist service 2. conside taper/ or d/c narcan espiecially if better with D50 3. Viral hep screen 4. cxr 5. CCM to sign-off, call w/questions or to request re-consult  Upon my evaluation, this patient had a high  probability of imminent or life-threatening deterioration due to encephalopathy, hypoglycemia, positive UDS, resp insufficiancy onpresentation  high complexity decision making to assess, manipulate, and support vital organ system failure including evalaution and mgmt of encephalopathy  I have personally provided 30 minutes of critical care time exclusive of time spent on separately billable procedures and education. Time includes review and summation of previous medical record, laboratory data, radiology results, independent review of ekg, coordination of care with ED MD, RN, and monitoring for potential decompensation. Interventions were performed as documented above.  Condition: stable Prognosis: fair Code Status: full  Andrew Garibaldi, MD  Thank you for your consult  [facility] Pager: [phone]

## 2017-09-28 NOTE — ED Notes (Addendum)
Patient refusing head CT. EDP notified and to speak with patient.

## 2017-09-28 NOTE — ED Notes (Signed)
Spoke with [facility] about patient bed assignment, [facility] to read through chart and call back with an update

## 2017-09-28 NOTE — ED Notes (Signed)
He tells me he has not been coughing, nor producing any phlegm.

## 2017-09-28 NOTE — ED Notes (Signed)
The patient continues to do well. He remains quite drowsy, but in no distress. The hospitalist initially decided to d/c home, but this was switched back to tele admit.

## 2017-09-28 NOTE — ED Notes (Signed)
Unable to page hospitalist due to no number being in [place]. Will attempt to find a number.

## 2017-09-28 NOTE — ED Notes (Signed)
Patient transported to CT 

## 2017-09-28 NOTE — ED Notes (Signed)
Patient able to tell birthday, registration notified to change patient birthday

## 2017-09-28 NOTE — ED Notes (Signed)
Spoke with ICU about patient, ICU felt patient better suited for another floor due to his stability. Will attempt to page hospitalist to discuss changing orders.

## 2017-09-28 NOTE — Patient Outreach (Signed)
ED Peer Support Specialist Patient Intake (Complete at intake & 30-60 Day Follow-up)  Name: Andrew [name]  MRN: [id number]  Age: 51 y.o.   Date of Admission: 09/28/2017  Intake: Initial Comments:      Primary Reason Admitted: drug overdose with heroin/crack cocaince Lab values: Alcohol/ETOH: Negative Positive UDS? Yes Amphetamines: No Barbiturates: No Benzodiazepines: No Cocaine: Yes Opiates: No Cannabinoids: No  Demographic information: Gender: Male Ethnicity: African American Marital Status:   Insurance Status: Uninsured/Self-pay [employer] (Work Neurosurgeon, Physicist, medical, etc.:   Lives with:   Living situation:    Reported Patient History: Patient reported health conditions:   Patient aware of HIV and hepatitis status:    In past year, has patient visited ED for any reason?    Number of ED visits:    Reason(s) for visit:    In past year, has patient been hospitalized for any reason?    Number of hospitalizations:    Reason(s) for hospitalization:    In past year, has patient been arrested?    Number of arrests:    Reason(s) for arrest:    In past year, has patient been incarcerated?    Number of incarcerations:    Reason(s) for incarceration:    In past year, has patient received medication-assisted treatment?    In past year, patient received the following treatments:    In past year, has patient received any harm reduction services?    Did this include any of the following?    In past year, has patient received care from a mental health provider for diagnosis other than SUD?    In past year, is this first time patient has overdosed?    Number of past overdoses:    In past year, is this first time patient has been hospitalized for an overdose?    Number of hospitalizations for overdose(s):    Is patient currently receiving treatment for a mental health diagnosis?    Patient reports experiencing difficulty  participating in SUD treatment:      Most important reason(s) for this difficulty?    Has patient received prior services for treatment?    In past, patient has received services from following agencies:    Plan of Care:  Suggested follow up at these agencies/treatment centers: Other (comment)(Patient stated that he was not interested in substance use treatment or a substance use support group. [facility] gave patient [facility] contact information for the patient to contact [facility] about substance use recovery support.)  Other information: Patient came in for a drugs overdose and patient informed [facility] that he was not interested in being sober at this time.     Mason Jim, CPSS  09/28/2017 12:30 PM

## 2017-09-28 NOTE — ED Notes (Signed)
ED TO INPATIENT HANDOFF REPORT  Name/Age/Gender Andrew [name] 51 y.o. male  Code Status Code Status History    This patient does not have a recorded code status. Please follow your organizational policy for patients in this situation.      Home/SNF/Other Home  Chief Complaint overdose  Level of Care/Admitting Diagnosis ED Disposition    ED Disposition Condition [facility] Area: [place] [[id number]]  Level of Care: Telemetry [5]  Admit to tele based on following criteria: Complex arrhythmia (Bradycardia/Tachycardia)  Admit to tele based on following criteria: Monitor QTC interval  Diagnosis: Overdose [016010]  Admitting Physician: Bonnell Public [[id number]]  Attending Physician: Elwyn Reach [[id number]]  Estimated length of stay: 3 - 4 days  Certification:: I certify this patient will need inpatient services for at least 2 midnights  PT Class (Do Not Modify): Inpatient [101]  PT Acc Code (Do Not Modify): Private [1]       Medical History History reviewed. No pertinent past medical history.  Allergies Allergies  Allergen Reactions  . Penicillins Hives    Has patient had a PCN reaction causing immediate rash, facial/tongue/throat swelling, SOB or lightheadedness with hypotension: no Has patient had a PCN reaction causing severe rash involving mucus membranes or skin necrosis: no Has patient had a PCN reaction that required hospitalization: no Has patient had a PCN reaction occurring within the last 10 years: yes If all of the above answers are "NO", then may proceed with Cephalosporin use.    IV Location/Drains/Wounds Patient Lines/Drains/Airways Status   Active Line/Drains/Airways    Name:   Placement date:   Placement time:   Site:   Days:   Peripheral IV 09/27/17 Left Antecubital   09/27/17    2250    Antecubital   1   Peripheral IV 09/27/17 Right Antecubital   09/27/17    2251    Antecubital   1           Labs/Imaging Results for orders placed or performed during the hospital encounter of 09/27/17 (from the past 48 hour(s))  Rapid urine drug screen (hospital performed)     Status: Abnormal   Collection Time: 09/27/17 10:48 PM  Result Value Ref Range   Opiates NONE DETECTED NONE DETECTED   Cocaine POSITIVE (A) NONE DETECTED   Benzodiazepines NONE DETECTED NONE DETECTED   Amphetamines NONE DETECTED NONE DETECTED   Tetrahydrocannabinol NONE DETECTED NONE DETECTED   Barbiturates NONE DETECTED NONE DETECTED    Comment: (NOTE) DRUG SCREEN FOR MEDICAL PURPOSES ONLY.  IF CONFIRMATION IS NEEDED FOR ANY PURPOSE, NOTIFY LAB WITHIN 5 DAYS. LOWEST DETECTABLE LIMITS FOR URINE DRUG SCREEN Drug Class                     Cutoff (ng/mL) Amphetamine and metabolites    1000 Barbiturate and metabolites    200 Benzodiazepine                 932 Tricyclics and metabolites     300 Opiates and metabolites        300 Cocaine and metabolites        300 THC                            50   Urinalysis, Routine w reflex microscopic     Status: Abnormal   Collection Time: 09/27/17 10:48 PM  Result Value Ref Range  Color, Urine YELLOW YELLOW   APPearance CLEAR CLEAR   Specific Gravity, Urine 1.020 1.005 - 1.030   pH 5.0 5.0 - 8.0   Glucose, UA NEGATIVE NEGATIVE mg/dL   Hgb urine dipstick NEGATIVE NEGATIVE   Bilirubin Urine NEGATIVE NEGATIVE   Ketones, ur 5 (A) NEGATIVE mg/dL   Protein, ur 30 (A) NEGATIVE mg/dL   Nitrite NEGATIVE NEGATIVE   Leukocytes, UA NEGATIVE NEGATIVE   RBC / HPF 0-5 0 - 5 RBC/hpf   WBC, UA 6-30 0 - 5 WBC/hpf   Bacteria, UA NONE SEEN NONE SEEN   Squamous Epithelial / LPF 0-5 (A) NONE SEEN   Mucus PRESENT    Hyaline Casts, UA PRESENT   CBC with Differential/Platelet     Status: Abnormal   Collection Time: 09/27/17 10:48 PM  Result Value Ref Range   WBC 11.1 (H) 4.0 - 10.5 K/uL   RBC 4.74 4.22 - 5.81 MIL/uL   Hemoglobin 15.0 13.0 - 17.0 g/dL   HCT 46.3 39.0 - 52.0 %    MCV 97.7 78.0 - 100.0 fL   MCH 31.6 26.0 - 34.0 pg   MCHC 32.4 30.0 - 36.0 g/dL   RDW 13.2 11.5 - 15.5 %   Platelets 288 150 - 400 K/uL   Neutrophils Relative % 78 %   Neutro Abs 8.6 (H) 1.7 - 7.7 K/uL   Lymphocytes Relative 13 %   Lymphs Abs 1.4 0.7 - 4.0 K/uL   Monocytes Relative 8 %   Monocytes Absolute 0.9 0.1 - 1.0 K/uL   Eosinophils Relative 1 %   Eosinophils Absolute 0.1 0.0 - 0.7 K/uL   Basophils Relative 0 %   Basophils Absolute 0.0 0.0 - 0.1 K/uL  Comprehensive metabolic panel     Status: Abnormal   Collection Time: 09/27/17 10:48 PM  Result Value Ref Range   Sodium 141 135 - 145 mmol/L   Potassium 4.5 3.5 - 5.1 mmol/L   Chloride 101 101 - 111 mmol/L   CO2 27 22 - 32 mmol/L   Glucose, Bld 46 (L) 65 - 99 mg/dL   BUN 16 6 - 20 mg/dL   Creatinine, Ser 1.96 (H) 0.61 - 1.24 mg/dL   Calcium 9.1 8.9 - 10.3 mg/dL   Total Protein 7.8 6.5 - 8.1 g/dL   Albumin 4.0 3.5 - 5.0 g/dL   AST 467 (H) 15 - 41 U/L   ALT 467 (H) 17 - 63 U/L   Alkaline Phosphatase 85 38 - 126 U/L   Total Bilirubin 1.2 0.3 - 1.2 mg/dL   GFR calc non Af Amer 23 (L) >60 mL/min   GFR calc Af Amer 27 (L) >60 mL/min    Comment: (NOTE) The eGFR has been calculated using the CKD EPI equation. This calculation has not been validated in all clinical situations. eGFR's persistently <60 mL/min signify possible Chronic Kidney Disease.    Anion gap 13 5 - 15  Ethanol     Status: None   Collection Time: 09/27/17 10:48 PM  Result Value Ref Range   Alcohol, Ethyl (B) <10 <10 mg/dL    Comment:        LOWEST DETECTABLE LIMIT FOR SERUM ALCOHOL IS 10 mg/dL FOR MEDICAL PURPOSES ONLY   Salicylate level     Status: None   Collection Time: 09/27/17 10:48 PM  Result Value Ref Range   Salicylate Lvl <6.3 2.8 - 30.0 mg/dL  Acetaminophen level     Status: Abnormal   Collection Time: 09/27/17 10:48 PM  Result Value Ref Range   Acetaminophen (Tylenol), Serum <10 (L) 10 - 30 ug/mL    Comment:        THERAPEUTIC  CONCENTRATIONS VARY SIGNIFICANTLY. A RANGE OF 10-30 ug/mL MAY BE AN EFFECTIVE CONCENTRATION FOR MANY PATIENTS. HOWEVER, SOME ARE BEST TREATED AT CONCENTRATIONS OUTSIDE THIS RANGE. ACETAMINOPHEN CONCENTRATIONS >150 ug/mL AT 4 HOURS AFTER INGESTION AND >50 ug/mL AT 12 HOURS AFTER INGESTION ARE OFTEN ASSOCIATED WITH TOXIC REACTIONS.   I-stat troponin, ED     Status: None   Collection Time: 09/27/17 11:01 PM  Result Value Ref Range   Troponin i, poc 0.01 0.00 - 0.08 ng/mL   Comment 3            Comment: Due to the release kinetics of cTnI, a negative result within the first hours of the onset of symptoms does not rule out myocardial infarction with certainty. If myocardial infarction is still suspected, repeat the test at appropriate intervals.   CBG monitoring, ED     Status: None   Collection Time: 09/28/17  1:01 AM  Result Value Ref Range   Glucose-Capillary 86 65 - 99 mg/dL  Ammonia     Status: Abnormal   Collection Time: 09/28/17  2:11 AM  Result Value Ref Range   Ammonia 43 (H) 9 - 35 umol/L  POC CBG, ED     Status: None   Collection Time: 09/28/17  8:32 AM  Result Value Ref Range   Glucose-Capillary 70 65 - 99 mg/dL  Comprehensive metabolic panel     Status: Abnormal   Collection Time: 09/28/17  8:46 AM  Result Value Ref Range   Sodium 138 135 - 145 mmol/L   Potassium 4.3 3.5 - 5.1 mmol/L   Chloride 106 101 - 111 mmol/L   CO2 25 22 - 32 mmol/L   Glucose, Bld 71 65 - 99 mg/dL   BUN 18 6 - 20 mg/dL   Creatinine, Ser 1.29 (H) 0.61 - 1.24 mg/dL   Calcium 8.3 (L) 8.9 - 10.3 mg/dL   Total Protein 6.3 (L) 6.5 - 8.1 g/dL   Albumin 3.3 (L) 3.5 - 5.0 g/dL   AST 2,001 (H) 15 - 41 U/L   ALT 1,548 (H) 17 - 63 U/L   Alkaline Phosphatase 71 38 - 126 U/L   Total Bilirubin 0.8 0.3 - 1.2 mg/dL   GFR calc non Af Amer >60 >60 mL/min   GFR calc Af Amer >60 >60 mL/min    Comment: (NOTE) The eGFR has been calculated using the CKD EPI equation. This calculation has not been  validated in all clinical situations. eGFR's persistently <60 mL/min signify possible Chronic Kidney Disease.    Anion gap 7 5 - 15  CBC with Differential/Platelet     Status: Abnormal   Collection Time: 09/28/17  8:46 AM  Result Value Ref Range   WBC 15.3 (H) 4.0 - 10.5 K/uL   RBC 4.21 (L) 4.22 - 5.81 MIL/uL   Hemoglobin 13.1 13.0 - 17.0 g/dL   HCT 39.4 39.0 - 52.0 %   MCV 93.6 78.0 - 100.0 fL   MCH 31.1 26.0 - 34.0 pg   MCHC 33.2 30.0 - 36.0 g/dL   RDW 13.4 11.5 - 15.5 %   Platelets 346 150 - 400 K/uL   Neutrophils Relative % 80 %   Neutro Abs 12.3 (H) 1.7 - 7.7 K/uL   Lymphocytes Relative 13 %   Lymphs Abs 2.0 0.7 - 4.0 K/uL   Monocytes Relative  7 %   Monocytes Absolute 1.0 0.1 - 1.0 K/uL   Eosinophils Relative 0 %   Eosinophils Absolute 0.0 0.0 - 0.7 K/uL   Basophils Relative 0 %   Basophils Absolute 0.0 0.0 - 0.1 K/uL  CK     Status: None   Collection Time: 09/28/17  8:46 AM  Result Value Ref Range   Total CK 260 49 - 397 U/L   Dg Chest 2 View  Result Date: 09/28/2017 CLINICAL DATA:  51 year old male with leukocytosis, found unresponsive. Suspected overdose. EXAM: CHEST  2 VIEW COMPARISON:  None. FINDINGS: Bibasilar patchy airspace opacity with positive spine sign on the lateral view. No evidence of pleural effusion. Bullous changes are present in the upper lungs, greater on the right than the left. Cardiac and mediastinal contours are likely within normal limits given low inspiratory volumes. No evidence of pneumothorax or pulmonary edema. No acute osseous abnormality. IMPRESSION: 1. Bibasilar patchy airspace opacities consistent with bilateral pneumonia. Given the clinical history of suspected opioid overdose, aspiration pneumonitis is also a consideration. 2. Right greater than left upper lung predominant bullous changes. Suspect underlying emphysema. Electronically Signed   By: Jacqulynn Cadet M.D.   On: 09/28/2017 11:58    Pending Labs Unresulted Labs (From  admission, onward)   Start     Ordered   09/29/17 0500  Comprehensive metabolic panel  Tomorrow morning,   R     09/28/17 1023   09/29/17 0500  CBC with Differential/Platelet  Tomorrow morning,   R     09/28/17 1023   09/28/17 1008  Culture, blood (routine x 2)  BLOOD CULTURE X 2,   R     09/28/17 1007   09/28/17 1008  Culture, expectorated sputum-assessment  Once,   R     09/28/17 1007   09/28/17 0007  Hepatitis panel, acute  STAT,   STAT     09/28/17 0006   Signed and Held  Comprehensive metabolic panel  Tomorrow morning,   R     Signed and Held   Signed and Held  CBC  Tomorrow morning,   R     Signed and Held   Signed and Held  Troponin I  Now then every 6 hours,   R     Signed and Held      Vitals/Pain Today's Vitals   09/28/17 1148 09/28/17 1200 09/28/17 1230 09/28/17 1300  BP: 118/75 121/76 116/78 121/79  Pulse: 83 83 74 94  Resp: '15 11 10 20  ' Temp: 98 F (36.7 C)     TempSrc: Oral     SpO2: 92% 93% 90% 92%  Weight:      Height:      PainSc: 0-No pain       Isolation Precautions No active isolations  Medications Medications  naloxone (NARCAN) 4 mg in dextrose 5 % 250 mL infusion (0 mg/hr Intravenous Stopped 09/28/17 0155)  levofloxacin (LEVAQUIN) IVPB 750 mg (750 mg Intravenous Not Given 09/28/17 1145)  heparin injection 5,000 Units (not administered)  vancomycin (VANCOCIN) 1,500 mg in sodium chloride 0.9 % 500 mL IVPB (1,500 mg Intravenous New Bag/Given 09/28/17 1145)  sodium chloride 0.9 % bolus 1,000 mL (0 mLs Intravenous Stopped 09/28/17 0136)  naloxone (NARCAN) injection 2 mg (2 mg Intravenous Given 09/27/17 2325)  naloxone [facility]) injection 2 mg (2 mg Intravenous Given 09/27/17 2325)  dextrose 50 % solution 25 mL (25 mLs Intravenous Given 09/28/17 0114)  lactated ringers bolus 1,000 mL (0 mLs Intravenous  Stopped 09/28/17 0433)  levofloxacin (LEVAQUIN) IVPB 750 mg (0 mg Intravenous Stopped 09/28/17 1140)    Mobility walks

## 2017-09-28 NOTE — ED Notes (Signed)
Patient adamantly refusing CT. Unwilling to answer why, unwilling to answer several other questions asked by EDP and other staff. Patient states he just wants to sleep. A&Ox3

## 2017-09-28 NOTE — Progress Notes (Signed)
PROGRESS NOTE    Andrew [name]  DQQ:[id number] DOB: Feb 21, 1966 DOA: 09/27/2017 PCP: No primary care provider on file.  Outpatient Specialists:     Brief Narrative: 51 year old African American male with medical history significant of polysubstance abuse that was brought in with unintentional drug overdose. Bystanders reported patient taking heroine. He was seen in the ER and started immediately on IV Narcan. Patient's mental status was altered at the time. Patient's urine drug screen showed only cocaine. He also reported only taking crack cocaine and no heroine. On admission, AST and ALT were 467, Scr was 1.96 and WBC was 11.1, WITH BLOOD SUGAR OF 46 ON PRESENTATION. No visualized ALT, AST or Scr at the moment (but patient may have records not merged at the moment). Last blood sugar checked was 70, and patient is currently eating. Will continue to monitor blood sugar. Repeat CBC revealed worsening leukocytosis with left shift.Will work patient up for possible infection. Patient is not a very good historian.  Assessment & Plan:   Principal Problem:   Crack cocaine overdose ([place]) Active Problems:   Hypoglycemia   Hepatitis   Leucocytosis   Overdose   Assessment and plan: - Altered Mental status - Hypoglycemia - Worsening leukocytosis  - Possible SIRS/Sepsis - Polysubstance abuse - Recent crack cocaine use - Abnormal LFT's and Scr - Possible hepatitis - Possible AKI versus AKI on CKD versus stable CKD (Await records to be merged) - Volume depletion  - Manage patient in telemetry - Pan culture patient - Start patient on antibiotics - Monitor LFT's, renal function and electrolytes - Hydrate patient - Low threshold to pursue acute hepatitis panel - Counseled patient to quit illicit drug use - Monitor blood sugar closely  Further management will depend on hospital course.   DVT prophylaxis: [place] Heparin Code Status: Full Family Communication: None Disposition Plan:  Home eventually   Consultants:   None  Procedures:   None  Antimicrobials:   IV Vanco and levaquin    Subjective: Poor historian. No new complaints  Objective: Vitals:   09/28/17 0700 09/28/17 0715 09/28/17 0730 09/28/17 0745  BP: 103/72 109/69 112/74 106/69  Pulse: 79 83 80 72  Resp: 10 10 10 12   SpO2: 93% 91% (!) 89% 92%    Intake/Output Summary (Last 24 hours) at 09/28/2017 1009 Last data filed at 09/28/2017 0433 Gross per 24 hour  Intake 2132.29 ml  Output -  Net 2132.29 ml   There were no vitals filed for this visit.  Examination:  General exam: Appears calm and comfortable  HEENT - No jaundice Respiratory system: Decreased air entry posteriorly. Cardiovascular system: S1 & S2. Gastrointestinal system: Abdomen is nondistended, soft and nontender. No organomegaly or masses felt. Normal bowel sounds heard. Central nervous system: Alert and oriented. No focal neurological deficits. Extremities: Symmetric 5 x 5 power. No edema   Data Reviewed: I have personally reviewed following labs and imaging studies  CBC: Recent Labs  Lab 09/27/17 2248 09/28/17 0846  WBC 11.1* 15.3*  NEUTROABS 8.6* 12.3*  HGB 15.0 13.1  HCT 46.3 39.4  MCV 97.7 93.6  PLT 288 194   Basic Metabolic Panel: Recent Labs  Lab 09/27/17 2248  NA 141  K 4.5  CL 101  CO2 27  GLUCOSE 46*  BUN 16  CREATININE 1.96*  CALCIUM 9.1   GFR: CrCl cannot be calculated (Unknown ideal weight.). Liver Function Tests: Recent Labs  Lab 09/27/17 2248  AST 467*  ALT 467*  ALKPHOS 85  BILITOT 1.2  PROT 7.8  ALBUMIN 4.0   No results for input(s): LIPASE, AMYLASE in the last 168 hours. Recent Labs  Lab 09/28/17 0211  AMMONIA 43*   Coagulation Profile: No results for input(s): INR, PROTIME in the last 168 hours. Cardiac Enzymes: No results for input(s): CKTOTAL, CKMB, CKMBINDEX, TROPONINI in the last 168 hours. BNP (last 3 results) No results for input(s): PROBNP in the last 8760  hours. HbA1C: No results for input(s): HGBA1C in the last 72 hours. CBG: Recent Labs  Lab 09/28/17 0101 09/28/17 0832  GLUCAP 86 70   Lipid Profile: No results for input(s): CHOL, HDL, LDLCALC, TRIG, CHOLHDL, LDLDIRECT in the last 72 hours. Thyroid Function Tests: No results for input(s): TSH, T4TOTAL, FREET4, T3FREE, THYROIDAB in the last 72 hours. Anemia Panel: No results for input(s): VITAMINB12, FOLATE, FERRITIN, TIBC, IRON, RETICCTPCT in the last 72 hours. Urine analysis:    Component Value Date/Time   COLORURINE YELLOW 09/27/2017 2248   APPEARANCEUR CLEAR 09/27/2017 2248   LABSPEC 1.020 09/27/2017 2248   PHURINE 5.0 09/27/2017 2248   GLUCOSEU NEGATIVE 09/27/2017 2248   HGBUR NEGATIVE 09/27/2017 2248   BILIRUBINUR NEGATIVE 09/27/2017 2248   KETONESUR 5 (A) 09/27/2017 2248   PROTEINUR 30 (A) 09/27/2017 2248   NITRITE NEGATIVE 09/27/2017 2248   LEUKOCYTESUR NEGATIVE 09/27/2017 2248   Sepsis Labs: @LABRCNTIP (procalcitonin:4,lacticidven:4)  )No results found for this or any previous visit (from the past 240 hour(s)).       Radiology Studies: No results found.      Scheduled Meds: Continuous Infusions: . levofloxacin (LEVAQUIN) IV    . naLOXone (NARCAN) adult infusion for OVERDOSE Stopped (09/28/17 0155)     LOS: 0 days    Time spent: 26 Minutes    Dana Allan, MD  [facility] Pager #: [phone] 7PM-7AM contact night coverage as above

## 2017-09-28 NOTE — H&P (Signed)
History and Physical    Andrew [name] ZOX:[id number] DOB: 10/07/1899 DOA: 09/27/2017  Referring MD/NP/PA: Dan Europe PCP: No primary care provider on file.   Outpatient Specialists: None   Patient coming from: Home  Chief Complaint: Accidental overdose  HPI: Andrew [name] is a 51 y.o. male with medical history significant of polysubstance abuse that was brought in with one intention of drug overdose. Bystanders reported patient taking heroine. He was seen in the ER and started immediately on IV Narcan. Patient is altered in mental status and not indicated at the time. He is more awake now. He was seen by critical care medicine and recommendation is this is not a critical care case and patient does not need to go to intensive care unit. Patient's urine drug screen showed only cocaine. He also reported only taking crack cocaine and no heroine. He is becoming hemodynamically stable but still worrisome for other symptoms. Patient is therefore being admitted to be observed and treated for polysubstance abuse especially cocaine overdose  ED Course: Patient has been on IV now can drip with IV fluids as well as supportive care  Review of Systems: As per HPI otherwise 10 point review of systems negative.   History reviewed. No pertinent past medical history.    has no tobacco, alcohol, and drug history on file.  Allergies not on file  No family history on file. Unacceptable: Noncontributory, unremarkable, or negative. Acceptable: Family history reviewed and not pertinent (If you reviewed it)  Prior to Admission medications   Not on File    Physical Exam: Vitals:   09/28/17 0000 09/28/17 0015 09/28/17 0030 09/28/17 0045  BP: 115/79 117/88 (!) 115/91 114/81  Pulse: 85 86 87 93  Resp: 11 16 14 10   SpO2: 100% 100% 100% 98%      Constitutional: NAD, calm, comfortable Vitals:   09/28/17 0000 09/28/17 0015 09/28/17 0030 09/28/17 0045  BP: 115/79 117/88 (!) 115/91 114/81  Pulse: 85 86  87 93  Resp: 11 16 14 10   SpO2: 100% 100% 100% 98%   Eyes: PERRL, lids and conjunctivae normal ENMT: Mucous membranes are moist. Posterior pharynx clear of any exudate or lesions.Normal dentition.  Neck: normal, supple, no masses, no thyromegaly Respiratory: clear to auscultation bilaterally, no wheezing, no crackles. Normal respiratory effort. No accessory muscle use.  Cardiovascular: Regular rate and rhythm, no murmurs / rubs / gallops. No extremity edema. 2+ pedal pulses. No carotid bruits.  Abdomen: no tenderness, no masses palpated. No hepatosplenomegaly. Bowel sounds positive.  Musculoskeletal: no clubbing / cyanosis. No joint deformity upper and lower extremities. Good ROM, no contractures. Normal muscle tone.  Skin: no rashes, lesions, ulcers. No induration Neurologic: CN 2-12 grossly intact. Sensation intact, DTR normal. Strength 5/5 in all 4.  Psychiatric: Normal judgment and insight. Alert and oriented x 3. Normal mood.   (Anything < 9 systems with 2 bullets each down codes to level 1) (If patient refuses exam can't bill higher level) (Make sure to document decubitus ulcers present on admission -- if possible -- and whether patient has chronic indwelling catheter at time of admission)  Labs on Admission: I have personally reviewed following labs and imaging studies  CBC: Recent Labs  Lab 09/27/17 2248  WBC 11.1*  NEUTROABS 8.6*  HGB 15.0  HCT 46.3  MCV 97.7  PLT 811   Basic Metabolic Panel: Recent Labs  Lab 09/27/17 2248  NA 141  K 4.5  CL 101  CO2 27  GLUCOSE 46*  BUN 16  CREATININE 1.96*  CALCIUM 9.1   GFR: CrCl cannot be calculated (Unknown ideal weight.). Liver Function Tests: Recent Labs  Lab 09/27/17 2248  AST 467*  ALT 467*  ALKPHOS 85  BILITOT 1.2  PROT 7.8  ALBUMIN 4.0   No results for input(s): LIPASE, AMYLASE in the last 168 hours. No results for input(s): AMMONIA in the last 168 hours. Coagulation Profile: No results for input(s):  INR, PROTIME in the last 168 hours. Cardiac Enzymes: No results for input(s): CKTOTAL, CKMB, CKMBINDEX, TROPONINI in the last 168 hours. BNP (last 3 results) No results for input(s): PROBNP in the last 8760 hours. HbA1C: No results for input(s): HGBA1C in the last 72 hours. CBG: Recent Labs  Lab 09/28/17 0101  GLUCAP 86   Lipid Profile: No results for input(s): CHOL, HDL, LDLCALC, TRIG, CHOLHDL, LDLDIRECT in the last 72 hours. Thyroid Function Tests: No results for input(s): TSH, T4TOTAL, FREET4, T3FREE, THYROIDAB in the last 72 hours. Anemia Panel: No results for input(s): VITAMINB12, FOLATE, FERRITIN, TIBC, IRON, RETICCTPCT in the last 72 hours. Urine analysis:    Component Value Date/Time   COLORURINE YELLOW 09/27/2017 2248   APPEARANCEUR CLEAR 09/27/2017 2248   LABSPEC 1.020 09/27/2017 2248   PHURINE 5.0 09/27/2017 2248   GLUCOSEU NEGATIVE 09/27/2017 2248   HGBUR NEGATIVE 09/27/2017 2248   BILIRUBINUR NEGATIVE 09/27/2017 2248   KETONESUR 5 (A) 09/27/2017 2248   PROTEINUR 30 (A) 09/27/2017 2248   NITRITE NEGATIVE 09/27/2017 2248   LEUKOCYTESUR NEGATIVE 09/27/2017 2248   Sepsis Labs: @LABRCNTIP (procalcitonin:4,lacticidven:4) )No results found for this or any previous visit (from the past 240 hour(s)).   Radiological Exams on Admission: No results found.  EKG: Independently reviewed.   Assessment/Plan Principal Problem:   Crack cocaine overdose (HCC) Active Problems:   Hypoglycemia   Hepatitis   Leucocytosis   Overdose    #1 drug overdose: Crack cocaine most likely. Patient to be admitted to step down unit. Provide supportive care with hydration. Discontinue neck and draped at this point. Monitor LFTs.  #2 hepatitis: Patient has elevated liver enzymes especially AST and ALT. With history of IV drug abuse patient may have had hepatitis BOC. He could also be having drunk related with toxicity to the liver. Monitor his LFTs. Check hepatitis panel.  #3  hypoglycemia patient has profound hypoglycemia on admission. This may be related again to drug abuse or poor oral intake. Continue glucose drip and check blood sugar frequently  #4 acute kidney injury: Probably secondary to dehydration and drug abuse. Most likely prerenal. Hydrate aggressively and monitor renal function   DVT prophylaxis: SCD Code Status: Full  Family Communication: Patient able to come indicate and give consent for his care Disposition Plan: Home Consults called: Critical care medicine Admission status: Inpatient  Severity of Illness: The appropriate patient status for this patient is INPATIENT. Inpatient status is judged to be reasonable and necessary in order to provide the required intensity of service to ensure the patient's safety. The patient's presenting symptoms, physical exam findings, and initial radiographic and laboratory data in the context of their chronic comorbidities is felt to place them at high risk for further clinical deterioration. Furthermore, it is not anticipated that the patient will be medically stable for discharge from the hospital within 2 midnights of admission. The following factors support the patient status of inpatient.   " The patient's presenting symptoms include altered mental status and hypoglycemia. " The worrisome physical exam findings include confusion. " The initial radiographic and laboratory data  are worrisome because of glucose of 46 with positive drug screen. " The chronic co-morbidities include none.   * I certify that at the point of admission it is my clinical judgment that the patient will require inpatient hospital care spanning beyond 2 midnights from the point of admission due to high intensity of service, high risk for further deterioration and high frequency of surveillance required.Barbette Merino MD [facility] Pager [phone]  If 7PM-7AM, please contact night-coverage [email or web address] Password  [email or web address]  09/28/2017, 2:00 AM

## 2017-09-28 NOTE — ED Provider Notes (Signed)
  Physical Exam  BP 103/73   Pulse 88   Resp (!) 9   SpO2 93%   Physical Exam  ED Course/Procedures     Procedures  MDM   Assuming care of patient from Dr. Darl Householder   Patient in the ED for overdose and unresponsive spell. Workup thus far shows + cocaine. Pt was dropped off by a friend, reporting that patient was unresponsive.   According to Dr. Darl Householder, plan is to f/u with ICU recs. Pt is on narcan drip.   Patient had no complains, no concerns from the nursing side. Will continue to monitor.   1:00: Critical care recommends that patient be discontinued off of Narcan.  UDS is negative for opiates, and patient reports that he has been using cocaine only. They also want patient to be admitted to medicine..  2:30 am Patient has refused CT scan of the head.  Medicine will admit him to stepdown.  Narcan drip has been discontinued, patient has been doing well so far. Patient now has given me his name, birthdate, and he is aware that he is in the hospital.  He still not very forthcoming as to what substance use.  We were able to track patient's chart based on his name, and it seems that he does have history of polysubstance abuse primarily with cocaine, but on occasion heroin and alcohol as well.  UDS can be negative for opiates if patient just consumed opiates, therefore we will monitor his respiratory status closely. Patient is coherent enough, that I cannot force him to get a CT scan.  Patient understands that the workup is limited without the CT scan, and that we will be able to rule out any brain bleed.  CRITICAL CARE Performed by: Layal Javid   Total critical care time: 32 minutes  Critical care time was exclusive of separately billable procedures and treating other patients.  Critical care was necessary to treat or prevent imminent or life-threatening deterioration.  Critical care was time spent personally by me on the following activities: development of treatment plan with  patient and/or surrogate as well as nursing, discussions with consultants, evaluation of patient's response to treatment, examination of patient, obtaining history from patient or surrogate, ordering and performing treatments and interventions, ordering and review of laboratory studies, ordering and review of radiographic studies, pulse oximetry and re-evaluation of patient's condition.        Varney Biles, MD 09/28/17 [phone]

## 2017-09-29 ENCOUNTER — Inpatient Hospital Stay (HOSPITAL_COMMUNITY): Payer: Medicare Other

## 2017-09-29 DIAGNOSIS — K759 Inflammatory liver disease, unspecified: Secondary | ICD-10-CM

## 2017-09-29 DIAGNOSIS — E162 Hypoglycemia, unspecified: Secondary | ICD-10-CM

## 2017-09-29 DIAGNOSIS — E869 Volume depletion, unspecified: Secondary | ICD-10-CM

## 2017-09-29 DIAGNOSIS — D72829 Elevated white blood cell count, unspecified: Secondary | ICD-10-CM

## 2017-09-29 LAB — COMPREHENSIVE METABOLIC PANEL WITH GFR
ALT: 1065 U/L — ABNORMAL HIGH (ref 17–63)
ALT: 895 U/L — ABNORMAL HIGH (ref 17–63)
AST: 805 U/L — ABNORMAL HIGH (ref 15–41)
AST: 469 U/L — ABNORMAL HIGH (ref 15–41)
Albumin: 2.8 g/dL — ABNORMAL LOW (ref 3.5–5.0)
Albumin: 2.8 g/dL — ABNORMAL LOW (ref 3.5–5.0)
Alkaline Phosphatase: 91 U/L (ref 38–126)
Alkaline Phosphatase: 84 U/L (ref 38–126)
Anion gap: 7 (ref 5–15)
Anion gap: 3 — ABNORMAL LOW (ref 5–15)
BUN: 20 mg/dL (ref 6–20)
BUN: 13 mg/dL (ref 6–20)
CO2: 26 mmol/L (ref 22–32)
CO2: 28 mmol/L (ref 22–32)
Calcium: 8.5 mg/dL — ABNORMAL LOW (ref 8.9–10.3)
Calcium: 8.2 mg/dL — ABNORMAL LOW (ref 8.9–10.3)
Chloride: 104 mmol/L (ref 101–111)
Chloride: 106 mmol/L (ref 101–111)
Creatinine, Ser: 1.02 mg/dL (ref 0.61–1.24)
Creatinine, Ser: 0.94 mg/dL (ref 0.61–1.24)
GFR calc Af Amer: >60 mL/min (ref >60)
GFR calc Af Amer: >60 mL/min
GFR calc non Af Amer: >60 mL/min (ref >60)
GFR calc non Af Amer: >60 mL/min
Glucose, Bld: 152 mg/dL — ABNORMAL HIGH (ref 65–99)
Glucose, Bld: 109 mg/dL — ABNORMAL HIGH (ref 65–99)
Potassium: 4.1 mmol/L (ref 3.5–5.1)
Potassium: 3.8 mmol/L (ref 3.5–5.1)
Sodium: 137 mmol/L (ref 135–145)
Sodium: 137 mmol/L (ref 135–145)
Total Bilirubin: 0.6 mg/dL (ref 0.3–1.2)
Total Bilirubin: 0.5 mg/dL (ref 0.3–1.2)
Total Protein: 5.7 g/dL — ABNORMAL LOW (ref 6.5–8.1)
Total Protein: 5.7 g/dL — ABNORMAL LOW (ref 6.5–8.1)

## 2017-09-29 LAB — CBC WITH DIFFERENTIAL/PLATELET
Basophils Absolute: 0 K/uL (ref 0.0–0.1)
Basophils Relative: 0 %
Eosinophils Absolute: 0.1 K/uL (ref 0.0–0.7)
Eosinophils Relative: 1 %
HCT: 40.2 % (ref 39.0–52.0)
Hemoglobin: 13.2 g/dL (ref 13.0–17.0)
Lymphocytes Relative: 22 %
Lymphs Abs: 2 K/uL (ref 0.7–4.0)
MCH: 30.8 pg (ref 26.0–34.0)
MCHC: 32.8 g/dL (ref 30.0–36.0)
MCV: 93.9 fL (ref 78.0–100.0)
Monocytes Absolute: 0.5 K/uL (ref 0.1–1.0)
Monocytes Relative: 6 %
Neutro Abs: 6.5 K/uL (ref 1.7–7.7)
Neutrophils Relative %: 71 %
Platelets: 327 K/uL (ref 150–400)
RBC: 4.28 MIL/uL (ref 4.22–5.81)
RDW: 13.2 % (ref 11.5–15.5)
WBC: 9.1 K/uL (ref 4.0–10.5)

## 2017-09-29 LAB — IRON AND TIBC
Iron: 54 ug/dL (ref 45–182)
SATURATION RATIOS: 23 % (ref 17.9–39.5)
TIBC: 232 ug/dL — ABNORMAL LOW (ref 250–450)
UIBC: 178 ug/dL

## 2017-09-29 LAB — GLUCOSE, CAPILLARY
GLUCOSE-CAPILLARY: 150 mg/dL — AB (ref 65–99)
Glucose-Capillary: 128 mg/dL — ABNORMAL HIGH (ref 65–99)
Glucose-Capillary: 137 mg/dL — ABNORMAL HIGH (ref 65–99)
Glucose-Capillary: 145 mg/dL — ABNORMAL HIGH (ref 65–99)
Glucose-Capillary: 149 mg/dL — ABNORMAL HIGH (ref 65–99)
Glucose-Capillary: 165 mg/dL — ABNORMAL HIGH (ref 65–99)

## 2017-09-29 LAB — FERRITIN: FERRITIN: 2859 ng/mL — AB (ref 24–336)

## 2017-09-29 NOTE — Progress Notes (Addendum)
Nutrition Brief Note  Patient identified on the Malnutrition Screening Tool (MST) Report  Wt Readings from Last 15 Encounters:  09/28/17 173 lb 8 oz (78.7 kg)   Pt with PMH significant for polysubstance abuse. Presents this admission with drug overdose likely source crack cocaine. Spoke with pt at bedside who reports having loss in appetite this week related to drug use. Pt typically consumes 3 meals a day and has great appetite. Pt eager to eat this admission. Denies any recent wt loss.   Body mass index is 27.17 kg/m. Patient meets criteria for overweight based on current BMI.   Current diet order is regular.   Labs and medications reviewed.   No nutrition interventions warranted at this time. If nutrition issues arise, please consult RD.   Mariana Single RD, LDN Clinical Nutrition Pager # [phone]

## 2017-09-29 NOTE — Progress Notes (Signed)
Patient had a Andrew [name] about an hour ago. The US Abdomen will be post-poned until early this afternoon @ 1330. The patient was reminded to not drink or eat anything until after the [place].

## 2017-09-29 NOTE — Consult Note (Signed)
Referring Provider: [facility] Primary Care Physician:  No primary care provider on file. Primary Gastroenterologist:  Althia Forts  Reason for Consultation:  Abnormal LFTs  HPI: Andrew [name] is a 51 y.o. male admitted to the hospital with a drug overdose. He was found to have elevated LFTs with AST of 2001 and ALT of 1548. Normal alkaline phosphatase and total bilirubin. GI is consulted for further evaluation.  Patient seen and examined at bedside. Patient admits using crack cocaine for many years. He denied use of any other recreational drugs. Denied any heavy alcohol use. He denied any GI symptoms at this time. Denied any abdominal pain, nausea or vomiting. Denied diarrhea or constipation. Denied blood in the stool or black stool.  No previous GI workup. No family history of colon cancer or liver disease.  Past Medical History:  Diagnosis Date  . Depression   . GERD (gastroesophageal reflux disease)     History reviewed. No pertinent surgical history.  Prior to Admission medications   Not on File    Scheduled Meds: . feeding supplement (ENSURE ENLIVE)  237 mL Oral BID BM  . heparin injection (subcutaneous)  5,000 Units Subcutaneous Q12H   Continuous Infusions: . cefTRIAXone (ROCEPHIN)  IV    . dextrose 5 % and 0.9% NaCl    . metronidazole 500 mg (09/29/17 0603)  . naLOXone [facility]) adult infusion for OVERDOSE Stopped (09/28/17 0155)  . vancomycin Stopped (09/29/17 0242)   PRN Meds:.  Allergies as of 09/27/2017  . (Not on File)    History reviewed. No pertinent family history.  Social History   Socioeconomic History  . Marital status: Married    Spouse name: Not on file  . Number of children: Not on file  . Years of education: Not on file  . Highest education level: Not on file  Social Needs  . Financial resource strain: Not on file  . Food insecurity - worry: Not on file  . Food insecurity - inability: Not on file  . Transportation needs - medical: Not on file   . Transportation needs - non-medical: Not on file  Occupational History  . Not on file  Tobacco Use  . Smoking status: Current Every Day Smoker    Packs/day: 0.25    Years: 30.00    Pack years: 7.50  . Smokeless tobacco: Never Used  Substance and Sexual Activity  . Alcohol use: No    Frequency: Never  . Drug use: Yes    Types: Cocaine  . Sexual activity: Not on file  Other Topics Concern  . Not on file  Social History Narrative  . Not on file    Review of Systems: Review of Systems  Constitutional: Positive for chills and malaise/fatigue.  HENT: Negative for hearing loss and tinnitus.   Eyes: Negative for blurred vision and double vision.  Respiratory: Negative for cough and hemoptysis.   Cardiovascular: Negative for chest pain and palpitations.  Gastrointestinal: Negative for abdominal pain, blood in stool, constipation, diarrhea, heartburn, melena, nausea and vomiting.  Genitourinary: Negative for dysuria and urgency.  Musculoskeletal: Positive for joint pain and myalgias.  Skin: Negative for itching and rash.  Neurological: Positive for weakness. Negative for speech change and seizures.  Endo/Heme/Allergies: Does not bruise/bleed easily.  Psychiatric/Behavioral: Positive for substance abuse. Negative for suicidal ideas.    Physical Exam: Vital signs: Vitals:   09/28/17 2033 09/29/17 0422  BP: 122/73 120/72  Pulse: 96 87  Resp: 18 16  Temp: 98.3 F (36.8 C) 98.5  F (36.9 C)  SpO2: 94% 93%     Physical Exam  Constitutional: He is oriented to person, place, and time. He appears well-developed and well-nourished. No distress.  HENT:  Head: Normocephalic and atraumatic.  Mouth/Throat: Oropharynx is clear and moist. No oropharyngeal exudate.  Eyes: EOM are normal. No scleral icterus.  Neck: Normal range of motion. Neck supple. No thyromegaly present.  Cardiovascular: Normal rate, regular rhythm and normal heart sounds.  Pulmonary/Chest: Effort normal and breath  sounds normal. No respiratory distress.  Abdominal: Soft. Bowel sounds are normal. He exhibits no distension. There is no tenderness. There is no rebound and no guarding.  Musculoskeletal: Normal range of motion. He exhibits no edema.  Neurological: He is alert and oriented to person, place, and time.  Skin: Skin is dry. No erythema.  Psychiatric: His behavior is normal. Thought content normal.  Vitals reviewed.   GI:  Lab Results: Recent Labs    09/28/17 0846 09/28/17 1416 09/29/17 0425  WBC 15.3* 10.5 9.1  HGB 13.1 14.8 13.2  HCT 39.4 43.6 40.2  PLT 346 240 327   BMET Recent Labs    09/28/17 0846 09/28/17 1508 09/29/17 0425  NA 138 136 137  K 4.3 4.2 4.1  CL 106 104 104  CO2 25 25 26   GLUCOSE 71 69 152*  BUN 18 QUANTITY NOT SUFFICIENT, UNABLE TO PERFORM TEST 20  CREATININE 1.29* QUANTITY NOT SUFFICIENT, UNABLE TO PERFORM TEST 1.02  CALCIUM 8.3* 8.5* 8.5*   LFT Recent Labs    09/29/17 0425  PROT 5.7*  ALBUMIN 2.8*  AST 805*  ALT 1,065*  ALKPHOS 91  BILITOT 0.6   PT/INR Recent Labs    09/28/17 1630  LABPROT 16.1*  INR 1.30     Studies/Results: Dg Chest 2 View  Result Date: 09/28/2017 CLINICAL DATA:  51 year old male with leukocytosis, found unresponsive. Suspected overdose. EXAM: CHEST  2 VIEW COMPARISON:  None. FINDINGS: Bibasilar patchy airspace opacity with positive spine sign on the lateral view. No evidence of pleural effusion. Bullous changes are present in the upper lungs, greater on the right than the left. Cardiac and mediastinal contours are likely within normal limits given low inspiratory volumes. No evidence of pneumothorax or pulmonary edema. No acute osseous abnormality. IMPRESSION: 1. Bibasilar patchy airspace opacities consistent with bilateral pneumonia. Given the clinical history of suspected opioid overdose, aspiration pneumonitis is also a consideration. 2. Right greater than left upper lung predominant bullous changes. Suspect  underlying emphysema. Electronically Signed   By: Jacqulynn Cadet M.D.   On: 09/28/2017 11:58    Impression/Plan: - Elevated LFTs with ALT of 2001 and AST of 1548 on admission in setting of accidental drug overdose. Differential diagnosis would be ischemic hepatitis versus infectious hepatitis such as hepatitis C infection - IV drug use  Recommendations ----------------------- - Patient is alert and oriented 3. INR 1.3. Normal alkaline phosphatase and normal bilirubin.Normal CBC. - LFTs already improving. - Follow ultrasound abdomen for further evaluation. - Follow acute hepatitis panel. Add HCV PCR as HCV Ab  may be negative in early acute phase of hepatitis C - Okay to advance diet after ultrasound results as tolerated. - Abstinence form substance use discussed with the patient. - GI will follow    LOS: 1 day   Otis Brace  MD, FACP 09/29/2017, 8:57 AM  Contact #  [phone]

## 2017-09-29 NOTE — Progress Notes (Signed)
PROGRESS NOTE    Andrew [name]  HYI:[id number] DOB: [birth year] DOA: 09/27/2017 PCP: No primary care provider on file.  Outpatient Specialists:     Brief Narrative: 51 year old African American male with medical history significant of polysubstance abuse that was brought in with unintentional drug overdose. Bystanders reported patient taking heroine. He was seen in the ER and started immediately on IV Narcan. Patient's mental status was altered at the time. Patient's urine drug screen showed only cocaine. He also reported only taking crack cocaine and no heroine. On admission, AST and ALT were 467, Scr was 1.96 and WBC was 11.1, WITH BLOOD SUGAR OF 46 ON PRESENTATION. No visualized ALT, AST or Scr at the moment (but patient may have records not merged at the moment). Last blood sugar checked was 70, and patient is currently eating. Will continue to monitor blood sugar. Repeat CBC revealed worsening leukocytosis with left shift.Will work patient up for possible infection. Patient is not a very good historian.  09/29/17: Patient seen. Patient is awaiting RUQ ultrasound. LFT's and leukocytosis worsened yesterday, but now improving. Leukocytosis has resolved. AKI has resolved. ALT/AST now improving. Ischemic versus viral hepatitis being ruled out. Patient will be discharged once cleared by GI.  Assessment & Plan:   Principal Problem:   Crack cocaine overdose ([place]) Active Problems:   Hypoglycemia   Hepatitis   Leucocytosis   Overdose   Assessment and plan: - Altered Mental status,r esolved. - Hypoglycemia, resolved. - Worsening leukocytosis, resolved.  - Possible SIRS/Sepsis, resolved significantly. Aspiration pneumonitis remains possibility. - Polysubstance abuse - Recent crack cocaine use - Abnormal LFT's and Scr - Hepatitis, ischemic versus viral. Follow work up - AK, resolved. - Volume depletion, resolved.  - Manage patient on telemetry bed - Follow culture. - Continue  antibiotics - Monitor LFT's, renal function and electrolytes - Continue to Hydrate patient - Follow acute hepatitis panel - Counseled patient to quit illicit drug use - Monitor blood sugar closely. Hypoglycemia has resolved - RUQ Ultrasound  Further management will depend on hospital course.   DVT prophylaxis: [place] Heparin Code Status: Full Family Communication: None Disposition Plan: Home eventually   Consultants:   None  Procedures:   None  Antimicrobials:   IV Rocephin and Flagyl   Subjective: No new complaints. Looking better today.  Objective: Vitals:   09/28/17 1300 09/28/17 1408 09/28/17 2033 09/29/17 0422  BP: 121/79 118/75 122/73 120/72  Pulse: 94 94 96 87  Resp: 20 20 18 16   Temp:  98.2 F (36.8 C) 98.3 F (36.8 C) 98.5 F (36.9 C)  TempSrc:  Oral Oral Oral  SpO2: 92% 93% 94% 93%  Weight:  78.7 kg (173 lb 8 oz)    Height:  5\' 7"  (1.702 m)      Intake/Output Summary (Last 24 hours) at 09/29/2017 1339 Last data filed at 09/29/2017 0142 Gross per 24 hour  Intake 1858.75 ml  Output -  Net 1858.75 ml   Filed Weights   09/28/17 1026 09/28/17 1408  Weight: 83.9 kg (185 lb) 78.7 kg (173 lb 8 oz)    Examination:  General exam: Appears calm and comfortable  HEENT - No jaundice Respiratory system: Decreased air entry posteriorly. Cardiovascular system: S1 & S2. Gastrointestinal system: Abdomen is nondistended, soft and nontender. No organomegaly or masses felt. Normal bowel sounds heard. Central nervous system: Alert and oriented. No focal neurological deficits. Extremities: Symmetric 5 x 5 power. No edema   Data Reviewed: I have personally reviewed following  labs and imaging studies  CBC: Recent Labs  Lab 09/27/17 2248 09/28/17 0846 09/28/17 1416 09/29/17 0425  WBC 11.1* 15.3* 10.5 9.1  NEUTROABS 8.6* 12.3*  --  6.5  HGB 15.0 13.1 14.8 13.2  HCT 46.3 39.4 43.6 40.2  MCV 97.7 93.6 93.4 93.9  PLT 288 346 240 983   Basic Metabolic  Panel: Recent Labs  Lab 09/27/17 2248 09/28/17 0846 09/28/17 1508 09/29/17 0425  NA 141 138 136 137  K 4.5 4.3 4.2 4.1  CL 101 106 104 104  CO2 27 25 25 26   GLUCOSE 46* 71 69 152*  BUN 16 18 QUANTITY NOT SUFFICIENT, UNABLE TO PERFORM TEST 20  CREATININE 1.96* 1.29* QUANTITY NOT SUFFICIENT, UNABLE TO PERFORM TEST 1.02  CALCIUM 9.1 8.3* 8.5* 8.5*   GFR: Estimated Creatinine Clearance: 79.2 mL/min (by C-G formula based on SCr of 1.02 mg/dL). Liver Function Tests: Recent Labs  Lab 09/27/17 2248 09/28/17 0846 09/28/17 1508 09/29/17 0425  AST 467* 2,001* QUANTITY NOT SUFFICIENT, UNABLE TO PERFORM TEST 805*  ALT 467* 1,548* QUANTITY NOT SUFFICIENT, UNABLE TO PERFORM TEST 1,065*  ALKPHOS 85 71 74 91  BILITOT 1.2 0.8 0.8 0.6  PROT 7.8 6.3* 6.5 5.7*  ALBUMIN 4.0 3.3* QUANTITY NOT SUFFICIENT, UNABLE TO PERFORM TEST 2.8*   No results for input(s): LIPASE, AMYLASE in the last 168 hours. Recent Labs  Lab 09/28/17 0211  AMMONIA 43*   Coagulation Profile: Recent Labs  Lab 09/28/17 1630  INR 1.30   Cardiac Enzymes: Recent Labs  Lab 09/28/17 0846  CKTOTAL 260   BNP (last 3 results) No results for input(s): PROBNP in the last 8760 hours. HbA1C: No results for input(s): HGBA1C in the last 72 hours. CBG: Recent Labs  Lab 09/28/17 2042 09/29/17 0025 09/29/17 0425 09/29/17 0743 09/29/17 1200  GLUCAP 105* 149* 150* 165* 128*   Lipid Profile: No results for input(s): CHOL, HDL, LDLCALC, TRIG, CHOLHDL, LDLDIRECT in the last 72 hours. Thyroid Function Tests: No results for input(s): TSH, T4TOTAL, FREET4, T3FREE, THYROIDAB in the last 72 hours. Anemia Panel: Recent Labs    09/28/17 1700  FERRITIN 2,859*  TIBC 232*  IRON 54   Urine analysis:    Component Value Date/Time   COLORURINE YELLOW 09/27/2017 2248   APPEARANCEUR CLEAR 09/27/2017 2248   LABSPEC 1.020 09/27/2017 2248   PHURINE 5.0 09/27/2017 2248   GLUCOSEU NEGATIVE 09/27/2017 2248   HGBUR NEGATIVE  09/27/2017 2248   BILIRUBINUR NEGATIVE 09/27/2017 2248   KETONESUR 5 (A) 09/27/2017 2248   PROTEINUR 30 (A) 09/27/2017 2248   NITRITE NEGATIVE 09/27/2017 2248   LEUKOCYTESUR NEGATIVE 09/27/2017 2248   Sepsis Labs: @LABRCNTIP (procalcitonin:4,lacticidven:4)  ) Recent Results (from the past 240 hour(s))  Culture, blood (routine x 2)     Status: None (Preliminary result)   Collection Time: 09/28/17 10:20 AM  Result Value Ref Range Status   Specimen Description BLOOD RIGHT WRIST  Final   Special Requests   Final    BOTTLES DRAWN AEROBIC AND ANAEROBIC Blood Culture adequate volume   Culture   Final    NO GROWTH < 24 HOURS Performed at [facility], [place] [address], [place], [place] [address]    Report Status PENDING  Incomplete  Culture, blood (routine x 2)     Status: None (Preliminary result)   Collection Time: 09/28/17 10:20 AM  Result Value Ref Range Status   Specimen Description BLOOD LEFT FOREARM  Final   Special Requests   Final  BOTTLES DRAWN AEROBIC AND ANAEROBIC Blood Culture adequate volume   Culture   Final    NO GROWTH < 24 HOURS Performed at [facility], [place] [address] [place],  [address]    Report Status PENDING  Incomplete  MRSA PCR Screening     Status: None   Collection Time: 09/28/17  2:24 PM  Result Value Ref Range Status   MRSA by PCR NEGATIVE NEGATIVE Final    Comment:        The GeneXpert MRSA Assay (FDA approved for NASAL specimens only), is one component of a comprehensive MRSA colonization surveillance program. It is not intended to diagnose MRSA infection nor to guide or monitor treatment for MRSA infections.          Radiology Studies: Dg Chest 2 View  Result Date: 09/28/2017 CLINICAL DATA:  51 year old male with leukocytosis, found unresponsive. Suspected overdose. EXAM: CHEST  2 VIEW COMPARISON:  None. FINDINGS: Bibasilar patchy airspace opacity with positive spine sign on the lateral view. No evidence of pleural  effusion. Bullous changes are present in the upper lungs, greater on the right than the left. Cardiac and mediastinal contours are likely within normal limits given low inspiratory volumes. No evidence of pneumothorax or pulmonary edema. No acute osseous abnormality. IMPRESSION: 1. Bibasilar patchy airspace opacities consistent with bilateral pneumonia. Given the clinical history of suspected opioid overdose, aspiration pneumonitis is also a consideration. 2. Right greater than left upper lung predominant bullous changes. Suspect underlying emphysema. Electronically Signed   By: Jacqulynn Cadet M.D.   On: 09/28/2017 11:58        Scheduled Meds: . feeding supplement (ENSURE ENLIVE)  237 mL Oral BID BM  . heparin injection (subcutaneous)  5,000 Units Subcutaneous Q12H   Continuous Infusions: . cefTRIAXone (ROCEPHIN)  IV Stopped (09/29/17 1130)  . dextrose 5 % and 0.9% NaCl    . metronidazole Stopped (09/29/17 0730)  . naLOXone [facility]) adult infusion for OVERDOSE Stopped (09/28/17 0155)  . vancomycin Stopped (09/29/17 0242)     LOS: 1 day    Time spent: [place]    Dana Allan, MD  [facility] Pager #: [phone] 7PM-7AM contact night coverage as above

## 2017-09-30 DIAGNOSIS — T401X1A Poisoning by heroin, accidental (unintentional), initial encounter: Secondary | ICD-10-CM

## 2017-09-30 LAB — HCV RNA QUANT RFLX ULTRA OR GENOTYP
HCV RNA Qnt(log copy/mL): UNDETERMINED log10 IU/mL
HepC Qn: NOT DETECTED IU/mL

## 2017-09-30 LAB — HEPATITIS B SURFACE ANTIGEN: HEP B S AG: NEGATIVE

## 2017-09-30 LAB — HEPATITIS PANEL, ACUTE
HEP B C IGM: NEGATIVE
HEP B S AG: NEGATIVE
Hep A IgM: NEGATIVE

## 2017-09-30 LAB — COMPREHENSIVE METABOLIC PANEL
ALBUMIN: 2.6 g/dL — AB (ref 3.5–5.0)
ALK PHOS: 87 U/L (ref 38–126)
ALT: 722 U/L — AB (ref 17–63)
AST: 270 U/L — ABNORMAL HIGH (ref 15–41)
Anion gap: 5 (ref 5–15)
BUN: 12 mg/dL (ref 6–20)
CALCIUM: 8.4 mg/dL — AB (ref 8.9–10.3)
CO2: 26 mmol/L (ref 22–32)
CREATININE: 0.86 mg/dL (ref 0.61–1.24)
Chloride: 105 mmol/L (ref 101–111)
GFR calc Af Amer: >60 mL/min (ref >60)
GFR calc non Af Amer: >60 mL/min (ref >60)
GLUCOSE: 157 mg/dL — AB (ref 65–99)
Potassium: 3.8 mmol/L (ref 3.5–5.1)
SODIUM: 136 mmol/L (ref 135–145)
Total Bilirubin: 0.6 mg/dL (ref 0.3–1.2)
Total Protein: 5.5 g/dL — ABNORMAL LOW (ref 6.5–8.1)

## 2017-09-30 LAB — GLUCOSE, CAPILLARY
GLUCOSE-CAPILLARY: 144 mg/dL — AB (ref 65–99)
Glucose-Capillary: 163 mg/dL — ABNORMAL HIGH (ref 65–99)
Glucose-Capillary: 153 mg/dL — ABNORMAL HIGH (ref 65–99)
Glucose-Capillary: 159 mg/dL — ABNORMAL HIGH (ref 65–99)

## 2017-09-30 LAB — ALPHA-1-ANTITRYPSIN: A-1 Antitrypsin, Ser: 138 mg/dL (ref 90–200)

## 2017-09-30 LAB — CERULOPLASMIN: Ceruloplasmin: 28.5 mg/dL (ref 16.0–31.0)

## 2017-09-30 NOTE — Progress Notes (Signed)
PROGRESS NOTE    Andrew [name]  HMC:[id number] DOB: Feb 18, 1966 DOA: 09/27/2017 PCP: No primary care provider on file.    Brief Narrative: 51 year old African American malewith medical history significant ofpolysubstance abuse that was brought in with unintentional drug overdose. Bystanders reported patient taking heroine.He was seen in the ER and started immediately on IV Narcan. He was encephalopathic on admission , which has improved.  He was also found to have elevated liver function tests.gastroenterology consulted and recommended monitoring.    Assessment & Plan:   Principal Problem:   Crack cocaine overdose ([place]) Active Problems:   Hypoglycemia   Hepatitis   Leucocytosis   Overdose  Acute encephalopathy secondary to cocaine overdose: Resolved.  Watch for withdrawal symptoms.    Elevated liver function tests/ shock liver:  GI consulted and monitor liver functions.  [place] abd shows liver masses, follow up with MRI liver.    SIRS Afebrile, leukocytosis resolved.    Aspiration pneumonitis:  Started him on rocephin and flagyl.   DVT prophylaxis: heparin sq Code Status: (full code).  Family Communication: none at bedside.  Disposition Plan: pending resolution of the pneumonia.    Consultants:   Gastroenterology.     Procedures: none.    Antimicrobials:flagyl and rocephin since admission.    Subjective: Appears angry, did not have any complaints. Requesting food.   Objective: Vitals:   09/29/17 1440 09/29/17 2116 09/30/17 0518 09/30/17 1257  BP: 128/85 (!) 141/88 113/72 129/77  Pulse: 72 82 65 76  Resp: 17 18 16 18   Temp: 98 F (36.7 C) 98.5 F (36.9 C) 98.2 F (36.8 C)   TempSrc: Oral Oral Oral   SpO2: 96% 96% 97% 97%  Weight:      Height:        Intake/Output Summary (Last 24 hours) at 09/30/2017 1437 Last data filed at 09/30/2017 1036 Gross per 24 hour  Intake 0 ml  Output 950 ml  Net -950 ml   Filed Weights   09/28/17 1026  09/28/17 1408  Weight: 83.9 kg (185 lb) 78.7 kg (173 lb 8 oz)    Examination:  General exam: Appears calm and comfortable  Respiratory system: Clear to auscultation. Respiratory effort normal. Cardiovascular system: S1 & S2 heard, RRR. No JVD, murmurs, rubs, gallops or clicks. No pedal edema. Gastrointestinal system: Abdomen is nondistended, soft and nontender. No organomegaly or masses felt. Normal bowel sounds heard. Central nervous system: Alert and oriented. No focal neurological deficits. Extremities: Symmetric 5 x 5 power. Skin: No rashes, lesions or ulcers Psychiatry: Judgement and insight appear normal. Mood & affect appropriate.     Data Reviewed: I have personally reviewed following labs and imaging studies  CBC: Recent Labs  Lab 09/27/17 2248 09/28/17 0846 09/28/17 1416 09/29/17 0425  WBC 11.1* 15.3* 10.5 9.1  NEUTROABS 8.6* 12.3*  --  6.5  HGB 15.0 13.1 14.8 13.2  HCT 46.3 39.4 43.6 40.2  MCV 97.7 93.6 93.4 93.9  PLT 288 346 240 662   Basic Metabolic Panel: Recent Labs  Lab 09/28/17 0846 09/28/17 1508 09/29/17 0425 09/29/17 1547 09/30/17 0427  NA 138 136 137 137 136  K 4.3 4.2 4.1 3.8 3.8  CL 106 104 104 106 105  CO2 25 25 26 28 26   GLUCOSE 71 69 152* 109* 157*  BUN 18 QUANTITY NOT SUFFICIENT, UNABLE TO PERFORM TEST 20 13 12   CREATININE 1.29* QUANTITY NOT SUFFICIENT, UNABLE TO PERFORM TEST 1.02 0.94 0.86  CALCIUM 8.3* 8.5* 8.5* 8.2* 8.4*  GFR: Estimated Creatinine Clearance: 93.9 mL/min (by C-G formula based on SCr of 0.86 mg/dL). Liver Function Tests: Recent Labs  Lab 09/28/17 0846 09/28/17 1508 09/29/17 0425 09/29/17 1547 09/30/17 0427  AST 2,001* QUANTITY NOT SUFFICIENT, UNABLE TO PERFORM TEST 805* 469* 270*  ALT 1,548* QUANTITY NOT SUFFICIENT, UNABLE TO PERFORM TEST 1,065* 895* 722*  ALKPHOS 71 74 91 84 87  BILITOT 0.8 0.8 0.6 0.5 0.6  PROT 6.3* 6.5 5.7* 5.7* 5.5*  ALBUMIN 3.3* QUANTITY NOT SUFFICIENT, UNABLE TO PERFORM TEST 2.8* 2.8*  2.6*   No results for input(s): LIPASE, AMYLASE in the last 168 hours. Recent Labs  Lab 09/28/17 0211  AMMONIA 43*   Coagulation Profile: Recent Labs  Lab 09/28/17 1630  INR 1.30   Cardiac Enzymes: Recent Labs  Lab 09/28/17 0846  CKTOTAL 260   BNP (last 3 results) No results for input(s): PROBNP in the last 8760 hours. HbA1C: No results for input(s): HGBA1C in the last 72 hours. CBG: Recent Labs  Lab 09/29/17 1200 09/29/17 1736 09/29/17 2112 09/30/17 0740 09/30/17 1208  GLUCAP 128* 145* 137* 144* 163*   Lipid Profile: No results for input(s): CHOL, HDL, LDLCALC, TRIG, CHOLHDL, LDLDIRECT in the last 72 hours. Thyroid Function Tests: No results for input(s): TSH, T4TOTAL, FREET4, T3FREE, THYROIDAB in the last 72 hours. Anemia Panel: Recent Labs    09/28/17 1700  FERRITIN 2,859*  TIBC 232*  IRON 54   Sepsis Labs: No results for input(s): PROCALCITON, LATICACIDVEN in the last 168 hours.  Recent Results (from the past 240 hour(s))  Culture, blood (routine x 2)     Status: None (Preliminary result)   Collection Time: 09/28/17 10:20 AM  Result Value Ref Range Status   Specimen Description BLOOD RIGHT WRIST  Final   Special Requests   Final    BOTTLES DRAWN AEROBIC AND ANAEROBIC Blood Culture adequate volume   Culture   Final    NO GROWTH 2 DAYS Performed at [facility], [address], [place], [place] [address]    Report Status PENDING  Incomplete  Culture, blood (routine x 2)     Status: None (Preliminary result)   Collection Time: 09/28/17 10:20 AM  Result Value Ref Range Status   Specimen Description BLOOD LEFT FOREARM  Final   Special Requests   Final    BOTTLES DRAWN AEROBIC AND ANAEROBIC Blood Culture adequate volume   Culture   Final    NO GROWTH 2 DAYS Performed at [facility], [place] [address] [place], [place] [address]    Report Status PENDING  Incomplete  MRSA PCR Screening     Status: None   Collection Time: 09/28/17  2:24 PM   Result Value Ref Range Status   MRSA by PCR NEGATIVE NEGATIVE Final    Comment:        The GeneXpert MRSA Assay (FDA approved for NASAL specimens only), is one component of a comprehensive MRSA colonization surveillance program. It is not intended to diagnose MRSA infection nor to guide or monitor treatment for MRSA infections.          Radiology Studies: US Abdomen Limited Ruq  Result Date: 09/29/2017 CLINICAL DATA:  Hepatitis. EXAM: ULTRASOUND ABDOMEN LIMITED RIGHT UPPER QUADRANT COMPARISON:  None. FINDINGS: Gallbladder: No gallstones or wall thickening visualized. No sonographic Murphy sign noted by sonographer. Common bile duct: Diameter: 4 mm Liver: Diffuse increased echogenicity seen in the liver. A ill defined hyperechoic region measuring 2.2 x 2.4 x 2.5 cm  is mildly heterogeneous with no increased through transmission or shadowing. Another hypoechoic region is identified measuring 1.5 by 1.8 x 1.3 cm with possible internal blood flow. Portal vein is patent on color Doppler imaging with normal direction of blood flow towards the liver. IMPRESSION: 1. Two liver masses. The hyperechoic mass may represent a hemangioma but is nonspecific. The other is hypoechoic with internal blood flow. Recommend an MRI for further characterization. Electronically Signed   By: Dorise Bullion III M.D   On: 09/29/2017 14:07        Scheduled Meds: . feeding supplement (ENSURE ENLIVE)  237 mL Oral BID BM  . heparin injection (subcutaneous)  5,000 Units Subcutaneous Q12H   Continuous Infusions: . cefTRIAXone (ROCEPHIN)  IV 1 g (09/30/17 1040)  . dextrose 5 % and 0.9% NaCl 125 mL/hr at 09/30/17 1045  . metronidazole Stopped (09/30/17 6387)  . naLOXone [facility]) adult infusion for OVERDOSE Stopped (09/28/17 0155)  . vancomycin Stopped (09/30/17 0145)     LOS: 2 days    Time spent: 35 minutes.     Hosie Poisson, MD [facility] Pager [phone]  If 7PM-7AM, please contact  night-coverage [email or web address] Password [facility] 09/30/2017, 2:37 PM

## 2017-09-30 NOTE — Progress Notes (Signed)
LFT's coming down significantly; serologic testing neg (Hep A/B/C, iron sat, ceruloplasmin).  Overall picture most c/w "shock liver" or transient toxic hepatitis.  Will continue to monitor LFT's--if they continue to trend downward, dischg (from OUR standpoint) will probably be ok in the next 1-2 days.  Pt shows no signs of encephalopathy--no asterixis, alert and appropriate, no periph stigmata of chron liver disease.  Cleotis Nipper, M.D. Pager [phone] If no answer or after 5 PM call [phone]

## 2017-10-01 ENCOUNTER — Inpatient Hospital Stay (HOSPITAL_COMMUNITY): Payer: Medicare Other

## 2017-10-01 DIAGNOSIS — T405X4D Poisoning by cocaine, undetermined, subsequent encounter: Secondary | ICD-10-CM

## 2017-10-01 LAB — GLUCOSE, CAPILLARY
GLUCOSE-CAPILLARY: 118 mg/dL — AB (ref 65–99)
GLUCOSE-CAPILLARY: 110 mg/dL — AB (ref 65–99)

## 2017-10-01 LAB — COMPREHENSIVE METABOLIC PANEL
ALBUMIN: 2.7 g/dL — AB (ref 3.5–5.0)
ALK PHOS: 86 U/L (ref 38–126)
ALT: 479 U/L — ABNORMAL HIGH (ref 17–63)
AST: 90 U/L — AB (ref 15–41)
Anion gap: 4 — ABNORMAL LOW (ref 5–15)
BILIRUBIN TOTAL: 0.3 mg/dL (ref 0.3–1.2)
BUN: 9 mg/dL (ref 6–20)
CO2: 23 mmol/L (ref 22–32)
Calcium: 8.4 mg/dL — ABNORMAL LOW (ref 8.9–10.3)
Chloride: 109 mmol/L (ref 101–111)
Creatinine, Ser: 0.79 mg/dL (ref 0.61–1.24)
GFR calc Af Amer: >60 mL/min (ref >60)
GFR calc non Af Amer: >60 mL/min (ref >60)
GLUCOSE: 180 mg/dL — AB (ref 65–99)
POTASSIUM: 3.8 mmol/L (ref 3.5–5.1)
Sodium: 136 mmol/L (ref 135–145)
TOTAL PROTEIN: 5.6 g/dL — AB (ref 6.5–8.1)

## 2017-10-01 LAB — MITOCHONDRIAL ANTIBODIES: MITOCHONDRIAL M2 AB, IGG: 6.6 U (ref 0.0–20.0)

## 2017-10-01 LAB — ANTI-SMOOTH MUSCLE ANTIBODY, IGG: F-ACTIN AB IGG: 20 U — AB (ref 0–19)

## 2017-10-01 MED ORDER — LORAZEPAM 2 MG/ML IJ SOLN
1.0000 mg | Freq: Once | INTRAMUSCULAR | Status: AC
Start: 2017-10-01 — End: 2017-10-01
  Administered 2017-10-01: 1 mg via INTRAVENOUS
  Filled 2017-10-01: qty 1

## 2017-10-01 MED ORDER — GADOBENATE DIMEGLUMINE 529 MG/ML IV SOLN: 20.0000 mL | Freq: Once | INTRAVENOUS | Status: DC | PRN

## 2017-10-01 NOTE — Progress Notes (Signed)
[facility] Gastroenterology Progress Note  Andrew [name] 51 y.o. 1966/03/03  CC:  Elevated LFTs   Subjective: Patient denied any active GI symptoms. Currently nothing by mouth for possible MRI today. Denied diarrhea or constipation.  ROS : Negative for chest pain and shortness of breath.   Objective: Vital signs in last 24 hours: Vitals:   09/30/17 2102 10/01/17 0522  BP: (!) 142/75 133/89  Pulse: 80 68  Resp: 18 18  Temp: 98.4 F (36.9 C) 98.3 F (36.8 C)  SpO2: 96% 96%    Physical Exam:  General:  Alert, cooperative, no distress, resting comfortably in the bed   Head:  Normocephalic, without obvious abnormality, atraumatic     Lungs:   Clear to auscultation bilaterally, respirations unlabored  Heart:  Regular rate and rhythm, S1, S2 normal  Abdomen:   Soft, non-tender, bowel sounds present, nondistended, no peritoneal signs           Lab Results: Recent Labs    09/30/17 0427 10/01/17 0450  NA 136 136  K 3.8 3.8  CL 105 109  CO2 26 23  GLUCOSE 157* 180*  BUN 12 9  CREATININE 0.86 0.79  CALCIUM 8.4* 8.4*   Recent Labs    09/30/17 0427 10/01/17 0450  AST 270* 90*  ALT 722* 479*  ALKPHOS 87 86  BILITOT 0.6 0.3  PROT 5.5* 5.6*  ALBUMIN 2.6* 2.7*   Recent Labs    09/28/17 1416 09/29/17 0425  WBC 10.5 9.1  NEUTROABS  --  6.5  HGB 14.8 13.2  HCT 43.6 40.2  MCV 93.4 93.9  PLT 240 327   Recent Labs    09/28/17 1630  LABPROT 16.1*  INR 1.30      Assessment/Plan: - Elevated LFTs with ALT of 2001 and AST of 1548 on admission in setting of accidental drug overdose. Hepatitis panel negative. HCV PCR negative. LFTs trending down. Most likely ischemic injury in setting of cocaine overdose - Abnormal ultrasound showing 2 liver mass. ?? Hemangioma . MRI liver pending - Elevated ferritin 2859. Normal iron saturation. Probably acute phase reaction - IV drug use  Recommendations ----------------------- -  patient's LFTs are trending down. Acute  hepatitis panel negative. HCV PCR also negative. Normal ceruloplasmin and alpha-1 antitrypsin level. Normal iron saturation. - AMA and ASMA pending. - MRI liver pending - Hopefully discharge home tomorrow depending on MRI results. - Recommend follow-up in GI clinic in 4 weeks after discharge to recheck LFTs.     Otis Brace MD, [place] 10/01/2017, 9:06 AM  Contact #  [phone]

## 2017-10-01 NOTE — Progress Notes (Signed)
PT Cancellation Note  Patient Details Name: Andrew [name] MRN: [id number] DOB: 03/02/1966   Cancelled Treatment:    Reason Eval/Treat Not Completed: PT screened, no needs identified, will sign off, Nursing reports that patient is up ad lib.    Claretha Cooper 10/01/2017, 3:31 PM Tresa Endo PT [phone]

## 2017-10-01 NOTE — Progress Notes (Signed)
PROGRESS NOTE    Andrew [name]  JJK:[id number] DOB: 1966-08-03 DOA: 09/27/2017 PCP: No primary care provider on file.  Brief Narrative: 51 year old African American malewith medical history significant ofpolysubstance abuse that was brought in with unintentional drug overdose. Bystanders reported patient taking heroine.He was seen in the ER and started immediately on IV Narcan. He was encephalopathic on admission , which has improved.  He was also found to have elevated liver function tests.gastroenterology consulted and recommended monitoring.      Assessment & Plan:   Principal Problem:   Crack cocaine overdose ([place]) Active Problems:   Hypoglycemia   Hepatitis   Leucocytosis   Overdose  Acute encephalopathy secondary to cocaine overdose: Resolved.  Watch for withdrawal symptoms.    Elevated liver function tests/ shock liver:  GI consulted and monitor liver functions.  [place] abd shows liver masses, follow up with MRI liver. Ordered.   SIRS Afebrile, leukocytosis resolved.    Aspiration pneumonitis:  Started him on rocephin and flagyl.      DVT prophylaxis: Subcu heparin Code Status: Full code Family Communication: No family available Disposition Plan: TBD, obtain PT consult Consultants:  GI  Procedures: None Antimicrobials: Rocephin and Flagyl Subjective: States he is sleepy but answers questions appropriately   Objective: Sleeping in bed in no acute distress able to wake him up easily. Vitals:   09/30/17 0518 09/30/17 1257 09/30/17 2102 10/01/17 0522  BP: 113/72 129/77 (!) 142/75 133/89  Pulse: 65 76 80 68  Resp: 16 18 18 18   Temp: 98.2 F (36.8 C)  98.4 F (36.9 C) 98.3 F (36.8 C)  TempSrc: Oral  Oral Oral  SpO2: 97% 97% 96% 96%  Weight:      Height:        Intake/Output Summary (Last 24 hours) at 10/01/2017 0700 Last data filed at 10/01/2017 0200 Gross per 24 hour  Intake 2396.25 ml  Output 1000 ml  Net 1396.25 ml    Filed Weights   09/28/17 1026 09/28/17 1408  Weight: 83.9 kg (185 lb) 78.7 kg (173 lb 8 oz)    Examination:  General exam: Appears calm and comfortable  Respiratory system: Clear to auscultation. Respiratory effort normal. Cardiovascular system: S1 & S2 heard, RRR. No JVD, murmurs, rubs, gallops or clicks. No pedal edema. Gastrointestinal system: Abdomen is nondistended, soft and nontender. No organomegaly or masses felt. Normal bowel sounds heard. Central nervous system: Alert and oriented. No focal neurological deficits. Extremities: Symmetric 5 x 5 power. Skin: No rashes, lesions or ulcers     Data Reviewed: I have personally reviewed following labs and imaging studies  CBC: Recent Labs  Lab 09/27/17 2248 09/28/17 0846 09/28/17 1416 09/29/17 0425  WBC 11.1* 15.3* 10.5 9.1  NEUTROABS 8.6* 12.3*  --  6.5  HGB 15.0 13.1 14.8 13.2  HCT 46.3 39.4 43.6 40.2  MCV 97.7 93.6 93.4 93.9  PLT 288 346 240 371   Basic Metabolic Panel: Recent Labs  Lab 09/28/17 1508 09/29/17 0425 09/29/17 1547 09/30/17 0427 10/01/17 0450  NA 136 137 137 136 136  K 4.2 4.1 3.8 3.8 3.8  CL 104 104 106 105 109  CO2 25 26 28 26 23   GLUCOSE 69 152* 109* 157* 180*  BUN QUANTITY NOT SUFFICIENT, UNABLE TO PERFORM TEST 20 13 12 9   CREATININE QUANTITY NOT SUFFICIENT, UNABLE TO PERFORM TEST 1.02 0.94 0.86 0.79  CALCIUM 8.5* 8.5* 8.2* 8.4* 8.4*   GFR: Estimated Creatinine Clearance: 101 mL/min (by C-G formula based  on SCr of 0.79 mg/dL). Liver Function Tests: Recent Labs  Lab 09/28/17 1508 09/29/17 0425 09/29/17 1547 09/30/17 0427 10/01/17 0450  AST QUANTITY NOT SUFFICIENT, UNABLE TO PERFORM TEST 805* 469* 270* 90*  ALT QUANTITY NOT SUFFICIENT, UNABLE TO PERFORM TEST 1,065* 895* 722* 479*  ALKPHOS 74 91 84 87 86  BILITOT 0.8 0.6 0.5 0.6 0.3  PROT 6.5 5.7* 5.7* 5.5* 5.6*  ALBUMIN QUANTITY NOT SUFFICIENT, UNABLE TO PERFORM TEST 2.8* 2.8* 2.6* 2.7*   No results for input(s): LIPASE,  AMYLASE in the last 168 hours. Recent Labs  Lab 09/28/17 0211  AMMONIA 43*   Coagulation Profile: Recent Labs  Lab 09/28/17 1630  INR 1.30   Cardiac Enzymes: Recent Labs  Lab 09/28/17 0846  CKTOTAL 260   BNP (last 3 results) No results for input(s): PROBNP in the last 8760 hours. HbA1C: No results for input(s): HGBA1C in the last 72 hours. CBG: Recent Labs  Lab 09/29/17 2112 09/30/17 0740 09/30/17 1208 09/30/17 1721 09/30/17 2100  GLUCAP 137* 144* 163* 153* 159*   Lipid Profile: No results for input(s): CHOL, HDL, LDLCALC, TRIG, CHOLHDL, LDLDIRECT in the last 72 hours. Thyroid Function Tests: No results for input(s): TSH, T4TOTAL, FREET4, T3FREE, THYROIDAB in the last 72 hours. Anemia Panel: Recent Labs    09/28/17 1700  FERRITIN 2,859*  TIBC 232*  IRON 54   Sepsis Labs: No results for input(s): PROCALCITON, LATICACIDVEN in the last 168 hours.  Recent Results (from the past 240 hour(s))  Culture, blood (routine x 2)     Status: None (Preliminary result)   Collection Time: 09/28/17 10:20 AM  Result Value Ref Range Status   Specimen Description BLOOD RIGHT WRIST  Final   Special Requests   Final    BOTTLES DRAWN AEROBIC AND ANAEROBIC Blood Culture adequate volume   Culture   Final    NO GROWTH 2 DAYS Performed at [facility], [address], [place], [place] [address]    Report Status PENDING  Incomplete  Culture, blood (routine x 2)     Status: None (Preliminary result)   Collection Time: 09/28/17 10:20 AM  Result Value Ref Range Status   Specimen Description BLOOD LEFT FOREARM  Final   Special Requests   Final    BOTTLES DRAWN AEROBIC AND ANAEROBIC Blood Culture adequate volume   Culture   Final    NO GROWTH 2 DAYS Performed at [facility], [place] [address], [place], [place] [address]    Report Status PENDING  Incomplete  MRSA PCR Screening     Status: None   Collection Time: 09/28/17  2:24 PM  Result Value Ref Range Status   MRSA  by PCR NEGATIVE NEGATIVE Final    Comment:        The GeneXpert MRSA Assay (FDA approved for NASAL specimens only), is one component of a comprehensive MRSA colonization surveillance program. It is not intended to diagnose MRSA infection nor to guide or monitor treatment for MRSA infections.          Radiology Studies: US Abdomen Limited Ruq  Result Date: 09/29/2017 CLINICAL DATA:  Hepatitis. EXAM: ULTRASOUND ABDOMEN LIMITED RIGHT UPPER QUADRANT COMPARISON:  None. FINDINGS: Gallbladder: No gallstones or wall thickening visualized. No sonographic Murphy sign noted by sonographer. Common bile duct: Diameter: 4 mm Liver: Diffuse increased echogenicity seen in the liver. A ill defined hyperechoic region measuring 2.2 x 2.4 x 2.5 cm is mildly heterogeneous with no increased through transmission or shadowing.  Another hypoechoic region is identified measuring 1.5 by 1.8 x 1.3 cm with possible internal blood flow. Portal vein is patent on color Doppler imaging with normal direction of blood flow towards the liver. IMPRESSION: 1. Two liver masses. The hyperechoic mass may represent a hemangioma but is nonspecific. The other is hypoechoic with internal blood flow. Recommend an MRI for further characterization. Electronically Signed   By: Dorise Bullion III M.D   On: 09/29/2017 14:07        Scheduled Meds: . feeding supplement (ENSURE ENLIVE)  237 mL Oral BID BM  . heparin injection (subcutaneous)  5,000 Units Subcutaneous Q12H   Continuous Infusions: . cefTRIAXone (ROCEPHIN)  IV Stopped (09/30/17 1110)  . dextrose 5 % and 0.9% NaCl 125 mL/hr at 09/30/17 1045  . metronidazole 500 mg (10/01/17 0606)  . naLOXone [facility]) adult infusion for OVERDOSE Stopped (09/28/17 0155)     LOS: 3 days       Georgette Shell, MD [facility] If 7PM-7AM, please contact night-coverage [email or web address] Password [email or web address] 10/01/2017, 7:00 AM

## 2017-10-02 ENCOUNTER — Inpatient Hospital Stay (HOSPITAL_COMMUNITY): Payer: Medicare Other

## 2017-10-02 ENCOUNTER — Encounter (HOSPITAL_COMMUNITY): Payer: Self-pay | Admitting: Radiology

## 2017-10-02 LAB — COMPREHENSIVE METABOLIC PANEL
ALBUMIN: 2.7 g/dL — AB (ref 3.5–5.0)
ALK PHOS: 70 U/L (ref 38–126)
ALT: 331 U/L — AB (ref 17–63)
AST: 54 U/L — AB (ref 15–41)
Anion gap: 3 — ABNORMAL LOW (ref 5–15)
BILIRUBIN TOTAL: 0.6 mg/dL (ref 0.3–1.2)
BUN: 10 mg/dL (ref 6–20)
CALCIUM: 8.3 mg/dL — AB (ref 8.9–10.3)
CO2: 25 mmol/L (ref 22–32)
CREATININE: 0.83 mg/dL (ref 0.61–1.24)
Chloride: 108 mmol/L (ref 101–111)
GFR calc Af Amer: >60 mL/min (ref >60)
GFR calc non Af Amer: >60 mL/min (ref >60)
GLUCOSE: 148 mg/dL — AB (ref 65–99)
Potassium: 3.8 mmol/L (ref 3.5–5.1)
SODIUM: 136 mmol/L (ref 135–145)
TOTAL PROTEIN: 5.6 g/dL — AB (ref 6.5–8.1)

## 2017-10-02 LAB — CBC
HEMATOCRIT: 40.2 % (ref 39.0–52.0)
HEMOGLOBIN: 13.3 g/dL (ref 13.0–17.0)
MCH: 30.6 pg (ref 26.0–34.0)
MCHC: 33.1 g/dL (ref 30.0–36.0)
MCV: 92.6 fL (ref 78.0–100.0)
Platelets: 324 10*3/uL (ref 150–400)
RBC: 4.34 MIL/uL (ref 4.22–5.81)
RDW: 13.4 % (ref 11.5–15.5)
WBC: 8.5 10*3/uL (ref 4.0–10.5)

## 2017-10-02 MED ORDER — ENSURE ENLIVE PO LIQD: 237.0000 mL | Freq: Two times a day (BID) | ORAL | 12 refills | Status: DC

## 2017-10-02 MED ORDER — LEVOFLOXACIN 750 MG PO TABS: 750.0000 mg | ORAL_TABLET | Freq: Every day | ORAL | 0 refills | Status: AC

## 2017-10-02 MED ORDER — IOPAMIDOL (ISOVUE-300) INJECTION 61%
100.0000 mL | Freq: Once | INTRAVENOUS | Status: AC | PRN
  Administered 2017-10-02: 100 mL via INTRAVENOUS

## 2017-10-02 MED ORDER — METRONIDAZOLE 500 MG PO TABS
500.0000 mg | ORAL_TABLET | Freq: Once | ORAL | Status: AC
  Administered 2017-10-02: 500 mg via ORAL
  Filled 2017-10-02: qty 1

## 2017-10-02 NOTE — Progress Notes (Signed)
Patient unable to tolerate MRI due to claustrophobia even with pre medications. MRI was cancelled by MRI tech. Will continue to monitor patient.

## 2017-10-02 NOTE — Discharge Summary (Signed)
Physician Discharge Summary  Patient ID: Andrew [name] MRN: [id number] DOB/AGE: 1966-08-08 51 y.o.  Admit date: 09/27/2017 Discharge date: 10/02/2017  Admission Diagnoses:  Discharge Diagnoses:  Principal Problem:   Crack cocaine overdose ([place]) Active Problems:   Hypoglycemia   Hepatitis, possibly ischemic   Leucocytosis   Accidental Overdose   Elevated Ferritin with normal iron saturation      Discharged Condition: Stable  Hospital Course: Patient is a 51 year old African American malewith medical history significant for polysubstance abuse. Patient was admitted unconscious following unintentional drug overdose. Bystanders reported patient taking heroine.Patient was seen in the ER and started immediately on IV Narcan. Patient was initially evaluated by the Critical Care team but Internal Medicine admission was advised. Patient's urine drug screen showed only cocaine. Patient admitted using crack cocaine and no heroine. On admission, AST and ALT were 467, Scr was 1.96 and WBC was 11.1, WITH BLOOD SUGAR OF 46 ON PRESENTATION.   Patient was admitted for further assessment and management. Blood sugar was monitored and supplemental dextrose used as deemed necessary. Patient was also fed. Leukocytosis initially worsened, but improved during the hospital stay. LFT's continued to worsen initially, and GI was consulted. Liver ultrasound and CT Liver findings are as documented below. No liver cirrhosis. Hep C PCR came back negative. Elevated LFT's is suspected to be ischemic, and is now improving. Patient has been cleared for discharge by the GI team. Patient was counseled to quit illicit drug use.   Consults: Gastroenterology  Significant Diagnostic Studies:  - Weakly positive ASMA. Negative Hep C PCR.  - CT liver Abdomen with and without contrast revealed 2 mm subpleural right middle lobe pulmonary nodule; 10 mm lesion straddling the medial and lateral segment left liver lobes said  to strongly favor a hemangioma; 2.2 cm lesion anterior segment of right liver lobe said to be technically indeterminate but favored to represent a hemangioma; 12 mm medial segment left liver lobe lesion said to be nondescript (Non-contrast chest CT can be considered in 12 months if patient is high-risk. This recommendation follows the consensus statement: Guidelines for management of Incidental Pulmonary Nodules Detected on CT Images: From the Fleischner Society 2017; Radiology 2017; [phone]). - Liver ultrasound revealed two liver masses. The hyperechoic mass is said to possibly represent a hemangioma but nonspecific. The other is hypoechoic with internal blood flow.     Treatments: See above  Discharge Exam: Blood pressure (!) 133/93, pulse 70, temperature 97.8 F (36.6 C), temperature source Oral, resp. rate 18, height 5\' 7"  (1.702 m), weight 78.7 kg (173 lb 8 oz), SpO2 100 %.   Disposition: Final discharge disposition not confirmed  Discharge Instructions    Call MD for:   Complete by:  As directed    Call MD with worsening of symptoms   Diet - low sodium heart healthy   Complete by:  As directed    Increase activity slowly   Complete by:  As directed      Allergies as of 10/02/2017      Reactions   Penicillins Hives   Has patient had a PCN reaction causing immediate rash, facial/tongue/throat swelling, SOB or lightheadedness with hypotension: no Has patient had a PCN reaction causing severe rash involving mucus membranes or skin necrosis: no Has patient had a PCN reaction that required hospitalization: no Has patient had a PCN reaction occurring within the last 10 years: yes If all of the above answers are "NO", then may proceed with Cephalosporin use.  Medication List    TAKE these medications   feeding supplement (ENSURE ENLIVE) Liqd Take 237 mLs by mouth 2 (two) times daily between meals.   levofloxacin 750 MG tablet Commonly known as:  LEVAQUIN Take 1 tablet  (750 mg total) by mouth daily for 5 days.        SignedBonnell Public 10/02/2017, 3:32 PM

## 2017-10-02 NOTE — Progress Notes (Addendum)
Went over discharge papers with patient.  Explained importance of finding a PCP MD and making an appointment with primary and Gi within 1-2 weeks.   Papers given to patient.  VSS.  Pt stated ride is downstairs, pt wheeled out via NT.

## 2017-10-02 NOTE — Progress Notes (Signed)
[facility] Gastroenterology Progress Note  Andrew [name] 51 y.o. 1966/08/04  CC:  Elevated LFTs   Subjective: Patient was not able to tolerate MRI  because of claustrophobia. Denied abdominal pain, nausea or vomiting. Denied diarrhea or constipation.  ROS : Negative for chest pain and shortness of breath.   Objective: Vital signs in last 24 hours: Vitals:   10/01/17 2020 10/02/17 0424  BP: (!) 144/78 137/80  Pulse: 75 65  Resp: 18 18  Temp: 98.2 F (36.8 C) 97.8 F (36.6 C)  SpO2: 100% 99%    Physical Exam:  General:  Alert, cooperative, no distress, resting comfortably in the bed   Head:  Normocephalic, without obvious abnormality, atraumatic     Lungs:   Clear to auscultation bilaterally, respirations unlabored  Heart:  Regular rate and rhythm, S1, S2 normal  Abdomen:   Soft, non-tender, bowel sounds present, nondistended, no peritoneal signs           Lab Results: Recent Labs    10/01/17 0450 10/02/17 0537  NA 136 136  K 3.8 3.8  CL 109 108  CO2 23 25  GLUCOSE 180* 148*  BUN 9 10  CREATININE 0.79 0.83  CALCIUM 8.4* 8.3*   Recent Labs    10/01/17 0450 10/02/17 0537  AST 90* 54*  ALT 479* 331*  ALKPHOS 86 70  BILITOT 0.3 0.6  PROT 5.6* 5.6*  ALBUMIN 2.7* 2.7*   Recent Labs    10/02/17 0537  WBC 8.5  HGB 13.3  HCT 40.2  MCV 92.6  PLT 324   No results for input(s): LABPROT, INR in the last 72 hours.    Assessment/Plan: - Elevated LFTs with ALT of 2001 and AST of 1548 on admission in setting of accidental drug overdose. Hepatitis panel negative. HCV PCR negative. LFTs trending down. Most likely ischemic injury in setting of cocaine overdose - Abnormal ultrasound showing 2 liver mass. ?? Hemangioma . MRI liver pending - Elevated ferritin 2859. Normal iron saturation. Probably acute phase reaction - ASMA weakly positive @ 20 ( Normal is 0 to 19) - IV drug use  Recommendations ----------------------- - Patient not able to tolerate MRI  because of claustrophobia. Offered outpatient workup but patient prefers to complete his workup while he is in the hospital. I will order CT liver/triple phase for further evaluation  -  patient's LFTs are trending down. Acute hepatitis panel negative. HCV PCR also negative. Normal ceruloplasmin and alpha-1 antitrypsin level. Normal iron saturation. - AMA negative and ASMA weakly positive @ 20 ( Normal is 0 to 19) - Okay to discharge from GI standpoint after CT scan. - GI will sign off. Follow-up in GI clinic in 4 weeks after discharge.      Otis Brace MD, [place] 10/02/2017, 8:35 AM  Contact #  [phone]

## 2017-10-03 ENCOUNTER — Encounter (HOSPITAL_COMMUNITY): Payer: Self-pay

## 2017-10-03 LAB — CULTURE, BLOOD (ROUTINE X 2)
Culture: NO GROWTH
Culture: NO GROWTH
Special Requests: ADEQUATE
Special Requests: ADEQUATE

## 2017-10-24 ENCOUNTER — Emergency Department (HOSPITAL_COMMUNITY): Payer: Medicare Other

## 2017-10-24 ENCOUNTER — Emergency Department (HOSPITAL_COMMUNITY)
Admission: EM | Admit: 2017-10-24 | Discharge: 2017-10-24 | Disposition: A | Discharge status: Home / Self Care | Payer: Medicare Other | Attending: Emergency Medicine | Admitting: Emergency Medicine

## 2017-10-24 DIAGNOSIS — I10 Essential (primary) hypertension: Secondary | ICD-10-CM | POA: Insufficient documentation

## 2017-10-24 DIAGNOSIS — F17200 Nicotine dependence, unspecified, uncomplicated: Secondary | ICD-10-CM | POA: Insufficient documentation

## 2017-10-24 DIAGNOSIS — R1011 Right upper quadrant pain: Secondary | ICD-10-CM | POA: Insufficient documentation

## 2017-10-24 DIAGNOSIS — Z96651 Presence of right artificial knee joint: Secondary | ICD-10-CM | POA: Insufficient documentation

## 2017-10-24 DIAGNOSIS — R197 Diarrhea, unspecified: Secondary | ICD-10-CM | POA: Diagnosis not present

## 2017-10-24 DIAGNOSIS — R112 Nausea with vomiting, unspecified: Secondary | ICD-10-CM | POA: Diagnosis not present

## 2017-10-24 DIAGNOSIS — F250 Schizoaffective disorder, bipolar type: Secondary | ICD-10-CM | POA: Diagnosis not present

## 2017-10-24 DIAGNOSIS — J449 Chronic obstructive pulmonary disease, unspecified: Secondary | ICD-10-CM | POA: Diagnosis not present

## 2017-10-24 DIAGNOSIS — R079 Chest pain, unspecified: Secondary | ICD-10-CM | POA: Insufficient documentation

## 2017-10-24 LAB — HEPATIC FUNCTION PANEL
ALBUMIN: 3.9 g/dL (ref 3.5–5.0)
ALK PHOS: 105 U/L (ref 38–126)
ALT: 24 U/L (ref 17–63)
AST: 33 U/L (ref 15–41)
BILIRUBIN INDIRECT: 0.6 mg/dL (ref 0.3–0.9)
Bilirubin, Direct: 0.2 mg/dL (ref 0.1–0.5)
TOTAL PROTEIN: 7.3 g/dL (ref 6.5–8.1)
Total Bilirubin: 0.8 mg/dL (ref 0.3–1.2)

## 2017-10-24 LAB — CBC
HEMATOCRIT: 43.4 % (ref 39.0–52.0)
HEMOGLOBIN: 14.5 g/dL (ref 13.0–17.0)
MCH: 31.3 pg (ref 26.0–34.0)
MCHC: 33.4 g/dL (ref 30.0–36.0)
MCV: 93.5 fL (ref 78.0–100.0)
Platelets: 299 10*3/uL (ref 150–400)
RBC: 4.64 MIL/uL (ref 4.22–5.81)
RDW: 14 % (ref 11.5–15.5)
WBC: 8 10*3/uL (ref 4.0–10.5)

## 2017-10-24 LAB — BASIC METABOLIC PANEL
ANION GAP: 10 (ref 5–15)
BUN: 17 mg/dL (ref 6–20)
CALCIUM: 9.2 mg/dL (ref 8.9–10.3)
CO2: 24 mmol/L (ref 22–32)
Chloride: 104 mmol/L (ref 101–111)
Creatinine, Ser: 1.04 mg/dL (ref 0.61–1.24)
GFR calc Af Amer: >60 mL/min (ref >60)
GFR calc non Af Amer: >60 mL/min (ref >60)
GLUCOSE: 107 mg/dL — AB (ref 65–99)
POTASSIUM: 4 mmol/L (ref 3.5–5.1)
Sodium: 138 mmol/L (ref 135–145)

## 2017-10-24 LAB — LIPASE, BLOOD: LIPASE: 51 U/L (ref 11–51)

## 2017-10-24 LAB — I-STAT TROPONIN, ED
Troponin i, poc: 0 ng/mL (ref 0.00–0.08)
Troponin i, poc: 0 ng/mL (ref 0.00–0.08)

## 2017-10-24 MED ORDER — ASPIRIN 81 MG PO CHEW
324.0000 mg | CHEWABLE_TABLET | Freq: Once | ORAL | Status: AC
Start: 2017-10-24 — End: 2017-10-24
  Administered 2017-10-24: 324 mg via ORAL
  Filled 2017-10-24: qty 4

## 2017-10-24 MED ORDER — ONDANSETRON 4 MG PO TBDP
4.0000 mg | ORAL_TABLET | Freq: Once | ORAL | Status: AC
  Administered 2017-10-24: 4 mg via ORAL
  Filled 2017-10-24: qty 1

## 2017-10-24 NOTE — Discharge Instructions (Signed)
Do not hesitate to return to the emergency room for any new, worsening or concerning symptoms. ° °Please obtain primary care using resource guide below. Let them know that you were seen in the emergency room and that they will need to obtain records for further outpatient management. ° ° °

## 2017-10-24 NOTE — ED Triage Notes (Signed)
Pt arrives to ED ambulatory with c/o of cp. This RN asked patient if had used any drugs due to his usual behavior in triage and pt states he does use cocaine but denies using this morning. Pt states he has always been having RUQ pain and was told recently he has something on his liver. Pt is alert and oriented in triage, no distress.

## 2017-10-24 NOTE — ED Provider Notes (Signed)
[facility] EMERGENCY DEPARTMENT Provider Note   CSN: [id number] Arrival date & time: 10/24/17  8416     History   Chief Complaint Chief Complaint  Patient presents with  . Chest Pain   HPI   Blood pressure 110/80, pulse (!) 58, resp. rate 15, SpO2 98 %.  Andrew [name] is a 52 y.o. male complaining of chest pain, shortness of breath, right upper quadrant pain, nausea vomiting, diarrhea post urination over the course of the last several weeks.  He denies fevers, chills, sick contacts.  He has not done any cocaine in the last 3 days.  Past Medical History:  Diagnosis Date  . Anxiety   . Arthritis    right knee  . Back pain    occasionally  . Bipolar disorder ([place])    denies  . COPD (chronic obstructive pulmonary disease) ([place])   . Depression   . Dizziness   . Generalized headaches    history of migraines-last one 02/02/14  . GERD (gastroesophageal reflux disease)    takes Prilosec daily as needed  . GERD (gastroesophageal reflux disease)   . Headache   . Hemorrhoids   . History of bronchitis    1 month ago and treated with Amoxicillin and given an inhaler;takes Prenisone  . Hyperlipidemia    takes Zetia daily  . Hypertension    takes HCTZ daily  . Joint pain   . Joint swelling   . Pneumonia    hx of 2 yrs ago  . Polysubstance abuse ([place])   . Schizoaffective disorder ([place])    denies  . Tuberculosis    treated for 1 year  . Umbilical hernia     Patient Active Problem List   Diagnosis Date Noted  . Crack cocaine overdose ([place]) 09/28/2017  . Hypoglycemia 09/28/2017  . Hepatitis 09/28/2017  . Leucocytosis 09/28/2017  . Overdose 09/28/2017  . Arthritis of knee, degenerative 01/24/2015  . Right knee pain 01/07/2014  . Concern about STD in male without diagnosis 01/06/2014  . Dysuria 12/09/2013  . High cholesterol 10/25/2013  . GERD (gastroesophageal reflux disease) 09/20/2013  . History of positive PPD 07/17/2012  . Cocaine abuse ([place])  04/02/2012  . Rhabdomyolysis 04/02/2012  . Hernia of abdominal wall 01/05/2011  . ERECTILE DYSFUNCTION, PSYCHOGENIC 02/13/2010  . BIPOLAR DISORDER UNSPECIFIED 01/31/2009  . TOBACCO ABUSE 01/31/2009  . ELEVATED BP READING WITHOUT DX HYPERTENSION 01/31/2009    Past Surgical History:  Procedure Laterality Date  . ESOPHAGOGASTRODUODENOSCOPY    . JOINT REPLACEMENT    . KNEE ARTHROSCOPY WITH MEDIAL MENISECTOMY Right 03/01/2014   Procedure: KNEE ARTHROSCOPY WITH MEDIAL MENISECTOMY;  Surgeon: Meredith Pel, MD;  Location: [facility];  Service: Orthopedics;  Laterality: Right;  . LIPOMA EXCISION  ~ 2010   "back left side of my head"  . PARTIAL KNEE ARTHROPLASTY Right 01/24/2015   Procedure: RIGHT KNEE MEDIAL COMPARTMENT REPLACEMENT VS TOTAL KNEE ARTHROPLASTY.;  Surgeon: Meredith Pel, MD;  Location: [facility];  Service: Orthopedics;  Laterality: Right;  RIGHT KNEE MEDIAL COMPARTMENT REPLACEMENT VS TOTAL KNEE ARTHROPLASTY.  . TOTAL KNEE ARTHROPLASTY Right        Home Medications    Prior to Admission medications   Medication Sig Start Date End Date Taking? Authorizing Provider  diphenhydramine-acetaminophen (TYLENOL PM) 25-500 MG TABS tablet Take 1 tablet by mouth at bedtime as needed.    [provider]  feeding supplement, ENSURE ENLIVE, (ENSURE ENLIVE) LIQD Take 237 mLs by mouth 2 (two)  times daily between meals. 10/02/17   Bonnell Public, MD  ibuprofen (ADVIL,MOTRIN) 600 MG tablet Take 1 tablet (600 mg total) by mouth every 8 (eight) hours as needed for fever or moderate pain. 11/09/16   Quintella Reichert, MD  promethazine (PHENERGAN) 25 MG tablet Take 1 tablet (25 mg total) by mouth every 8 (eight) hours as needed for nausea or vomiting. 11/09/16   Quintella Reichert, MD    Family History Family History  Problem Relation Age of Onset  . Hypertension Mother   . Cancer Mother   . Heart disease Father   . Heart attack Father 62  . Hyperlipidemia Father   . Hypertension Father     . Diabetes Sister   . Diabetes Brother     Social History Social History   Tobacco Use  . Smoking status: Current Every Day Smoker    Packs/day: 0.25    Years: 30.00    Pack years: 7.50  . Smokeless tobacco: Never Used  Substance Use Topics  . Alcohol use: No    Frequency: Never    Comment: occasionally   . Drug use: Yes    Types: Marijuana, Cocaine    Comment: cocaine, night before last     Allergies   Mushroom extract complex; Penicillins; and Penicillins   Review of Systems Review of Systems  A complete review of systems was obtained and all systems are negative except as noted in the HPI and PMH.   Physical Exam Updated Vital Signs BP 123/79 (BP Location: Right Arm)   Pulse 60   Temp (!) 97.5 F (36.4 C) (Oral)   Resp 16   SpO2 100%   Physical Exam  Constitutional: He is oriented to person, place, and time. He appears well-developed and well-nourished. No distress.  HENT:  Head: Normocephalic and atraumatic.  Mouth/Throat: Oropharynx is clear and moist.  Eyes: Conjunctivae and EOM are normal. Pupils are equal, round, and reactive to light.  Neck: Normal range of motion. No JVD present. No tracheal deviation present.  Cardiovascular: Normal rate, regular rhythm and intact distal pulses.  Radial pulse equal bilaterally  Pulmonary/Chest: Effort normal and breath sounds normal. No stridor. No respiratory distress. He has no wheezes. He has no rales. He exhibits no tenderness.  Abdominal: Soft. He exhibits no distension and no mass. There is no tenderness. There is no rebound and no guarding. No hernia.  Musculoskeletal: Normal range of motion. He exhibits no edema or tenderness.  No calf asymmetry, superficial collaterals, palpable cords, edema, Homans sign negative bilaterally.    Neurological: He is alert and oriented to person, place, and time.  Skin: Skin is warm. He is not diaphoretic.  Psychiatric: He has a normal mood and affect.  Nursing note and  vitals reviewed.    ED Treatments / Results  Labs (all labs ordered are listed, but only abnormal results are displayed) Labs Reviewed  BASIC METABOLIC PANEL - Abnormal; Notable for the following components:      Result Value   Glucose, Bld 107 (*)    All other components within normal limits  CBC  HEPATIC FUNCTION PANEL  LIPASE, BLOOD  I-STAT TROPONIN, ED  I-STAT TROPONIN, ED    EKG  EKG Interpretation  Date/Time:  Friday October 24 2017 05:56:04 EST Ventricular Rate:  74 PR Interval:  124 QRS Duration: 86 QT Interval:  384 QTC Calculation: 426 R Axis:   18 Text Interpretation:  Normal sinus rhythm T wave abnormality Abnormal ekg Confirmed by Vanita Panda,  Herbie Baltimore [phone]) on 10/24/2017 7:45:56 AM       Radiology Dg Chest 2 View  Result Date: 10/24/2017 CLINICAL DATA:  Patient with chest pain. EXAM: CHEST  2 VIEW COMPARISON:  Chest radiograph 11/20/2016. FINDINGS: Stable cardiac and mediastinal contours. Minimal basilar atelectasis. Extensive right greater than left apical bullous change. Thoracic spine degenerative change. No pleural effusion or pneumothorax. IMPRESSION: No acute cardiopulmonary process. Bullous emphysema. Electronically Signed   By: Lovey Newcomer M.D.   On: 10/24/2017 07:52    Procedures Procedures (including critical care time)  Medications Ordered in ED Medications  ondansetron (ZOFRAN-ODT) disintegrating tablet 4 mg (4 mg Oral Given 10/24/17 0805)  aspirin chewable tablet 324 mg (324 mg Oral Given 10/24/17 3810)     Initial Impression / Assessment and Plan / ED Course  I have reviewed the triage vital signs and the nursing notes.  Pertinent labs & imaging results that were available during my care of the patient were reviewed by me and considered in my medical decision making (see chart for details).     Vitals:   10/24/17 0750 10/24/17 0826  BP: 110/80 123/79  Pulse: (!) 58 60  Resp: 15 16  Temp:  (!) 97.5 F (36.4 C)  TempSrc:  Oral  SpO2:  98% 100%    Medications  ondansetron (ZOFRAN-ODT) disintegrating tablet 4 mg (4 mg Oral Given 10/24/17 0805)  aspirin chewable tablet 324 mg (324 mg Oral Given 10/24/17 0833)    Andrew [name] is 52 y.o. male presenting with chest pain, right upper quadrant pain, nausea vomiting diarrhea.  Abdominal exam is benign.  Vital signs reassuring.  EKG with T wave inversions changed from prior EKG in the remote past, troponin negative, chest x-ray clear, given the EKG changes will delta troponin.  Repeat abdominal exam is benign, patient is tolerating p.o.'s.  Repeat troponin negative.  Patient stable for discharge, resource guide given for outpatient care.  This is a shared visit with the attending physician who personally evaluated the patient and agrees with the care plan.   Evaluation does not show pathology that would require ongoing emergent intervention or inpatient treatment. Pt is hemodynamically stable and mentating appropriately. Discussed findings and plan with patient/guardian, who agrees with care plan. All questions answered. Return precautions discussed and outpatient follow up given.      Final Clinical Impressions(s) / ED Diagnoses   Final diagnoses:  Chest pain, unspecified type  Abdominal pain, RUQ  Nausea vomiting and diarrhea    ED Discharge Orders    None       Waynetta Pean 10/24/17 1751    Carmin Muskrat, MD 10/24/17 1422

## 2017-10-24 NOTE — ED Notes (Signed)
Pt given turkey sandwich

## 2017-10-24 NOTE — ED Notes (Signed)
Pt given [place] sandwich-- states that he was supposed to be in court today.

## 2017-10-24 NOTE — ED Notes (Signed)
Pt has slow responses, staring at staff, pt has box cutter in pocket-- denies using any cocaine for 2-3 days--  Removed boxcutter and placed in bag with label at desk.

## 2017-10-28 DIAGNOSIS — F250 Schizoaffective disorder, bipolar type: Secondary | ICD-10-CM | POA: Diagnosis not present

## 2018-02-18 DIAGNOSIS — F250 Schizoaffective disorder, bipolar type: Secondary | ICD-10-CM | POA: Diagnosis not present

## 2018-03-06 ENCOUNTER — Encounter (HOSPITAL_BASED_OUTPATIENT_CLINIC_OR_DEPARTMENT_OTHER): Payer: Self-pay | Admitting: *Deleted

## 2018-03-06 ENCOUNTER — Other Ambulatory Visit: Payer: Self-pay

## 2018-03-06 ENCOUNTER — Emergency Department (HOSPITAL_BASED_OUTPATIENT_CLINIC_OR_DEPARTMENT_OTHER)
Admission: EM | Admit: 2018-03-06 | Discharge: 2018-03-06 | Disposition: A | Discharge status: Home / Self Care | Payer: Medicare Other | Attending: Emergency Medicine | Admitting: Emergency Medicine

## 2018-03-06 DIAGNOSIS — I10 Essential (primary) hypertension: Secondary | ICD-10-CM | POA: Insufficient documentation

## 2018-03-06 DIAGNOSIS — Z79899 Other long term (current) drug therapy: Secondary | ICD-10-CM | POA: Insufficient documentation

## 2018-03-06 DIAGNOSIS — J449 Chronic obstructive pulmonary disease, unspecified: Secondary | ICD-10-CM | POA: Insufficient documentation

## 2018-03-06 DIAGNOSIS — F17210 Nicotine dependence, cigarettes, uncomplicated: Secondary | ICD-10-CM | POA: Diagnosis not present

## 2018-03-06 DIAGNOSIS — R42 Dizziness and giddiness: Secondary | ICD-10-CM | POA: Diagnosis present

## 2018-03-06 DIAGNOSIS — R5383 Other fatigue: Secondary | ICD-10-CM | POA: Diagnosis not present

## 2018-03-06 DIAGNOSIS — Z202 Contact with and (suspected) exposure to infections with a predominantly sexual mode of transmission: Secondary | ICD-10-CM | POA: Insufficient documentation

## 2018-03-06 DIAGNOSIS — F17200 Nicotine dependence, unspecified, uncomplicated: Secondary | ICD-10-CM | POA: Insufficient documentation

## 2018-03-06 DIAGNOSIS — R739 Hyperglycemia, unspecified: Secondary | ICD-10-CM | POA: Insufficient documentation

## 2018-03-06 DIAGNOSIS — R531 Weakness: Secondary | ICD-10-CM | POA: Diagnosis not present

## 2018-03-06 LAB — COMPREHENSIVE METABOLIC PANEL
ALBUMIN: 3.8 g/dL (ref 3.5–5.0)
ALT: 19 U/L (ref 17–63)
ANION GAP: 11 (ref 5–15)
AST: 23 U/L (ref 15–41)
Alkaline Phosphatase: 165 U/L — ABNORMAL HIGH (ref 38–126)
BUN: 17 mg/dL (ref 6–20)
CO2: 21 mmol/L — AB (ref 22–32)
Calcium: 8.8 mg/dL — ABNORMAL LOW (ref 8.9–10.3)
Chloride: 94 mmol/L — ABNORMAL LOW (ref 101–111)
Creatinine, Ser: 1.29 mg/dL — ABNORMAL HIGH (ref 0.61–1.24)
GFR calc Af Amer: >60 mL/min (ref >60)
GFR calc non Af Amer: >60 mL/min (ref >60)
GLUCOSE: 606 mg/dL — AB (ref 65–99)
Potassium: 4.3 mmol/L (ref 3.5–5.1)
SODIUM: 126 mmol/L — AB (ref 135–145)
Total Bilirubin: 0.5 mg/dL (ref 0.3–1.2)
Total Protein: 7.3 g/dL (ref 6.5–8.1)

## 2018-03-06 LAB — URINALYSIS, ROUTINE W REFLEX MICROSCOPIC
BILIRUBIN URINE: NEGATIVE
Glucose, UA: >=500 mg/dL — AB
HGB URINE DIPSTICK: NEGATIVE
Ketones, ur: NEGATIVE mg/dL
Leukocytes, UA: NEGATIVE
Nitrite: NEGATIVE
PH: 6 (ref 5.0–8.0)
Protein, ur: NEGATIVE mg/dL
SPECIFIC GRAVITY, URINE: 1.015 (ref 1.005–1.030)

## 2018-03-06 LAB — CBG MONITORING, ED
GLUCOSE-CAPILLARY: 581 mg/dL — AB (ref 65–99)
Glucose-Capillary: 494 mg/dL — ABNORMAL HIGH (ref 65–99)
Glucose-Capillary: 400 mg/dL — ABNORMAL HIGH (ref 65–99)

## 2018-03-06 LAB — CBC WITH DIFFERENTIAL/PLATELET
BASOS PCT: 0 %
Basophils Absolute: 0 10*3/uL (ref 0.0–0.1)
EOS ABS: 0.2 10*3/uL (ref 0.0–0.7)
Eosinophils Relative: 3 %
HCT: 39.6 % (ref 39.0–52.0)
HEMOGLOBIN: 13.9 g/dL (ref 13.0–17.0)
Lymphocytes Relative: 26 %
Lymphs Abs: 2 10*3/uL (ref 0.7–4.0)
MCH: 31.1 pg (ref 26.0–34.0)
MCHC: 35.1 g/dL (ref 30.0–36.0)
MCV: 88.6 fL (ref 78.0–100.0)
MONO ABS: 0.8 10*3/uL (ref 0.1–1.0)
MONOS PCT: 10 %
NEUTROS PCT: 61 %
Neutro Abs: 4.6 10*3/uL (ref 1.7–7.7)
Platelets: 245 10*3/uL (ref 150–400)
RBC: 4.47 MIL/uL (ref 4.22–5.81)
RDW: 12.4 % (ref 11.5–15.5)
WBC: 7.6 10*3/uL (ref 4.0–10.5)

## 2018-03-06 LAB — URINALYSIS, MICROSCOPIC (REFLEX): RBC / HPF: NONE SEEN RBC/hpf (ref 0–5)

## 2018-03-06 MED ORDER — INSULIN REGULAR HUMAN 100 UNIT/ML IJ SOLN
8.0000 [IU] | Freq: Once | INTRAMUSCULAR | Status: AC
  Administered 2018-03-06: 8 [IU] via SUBCUTANEOUS
  Filled 2018-03-06: qty 1

## 2018-03-06 MED ORDER — METFORMIN HCL 500 MG PO TABS
500.0000 mg | ORAL_TABLET | Freq: Once | ORAL | Status: AC
  Administered 2018-03-06: 500 mg via ORAL
  Filled 2018-03-06: qty 1

## 2018-03-06 MED ORDER — SODIUM CHLORIDE 0.9 % IV BOLUS
1000.0000 mL | Freq: Once | INTRAVENOUS | Status: AC
  Administered 2018-03-06: 1000 mL via INTRAVENOUS

## 2018-03-06 MED ORDER — METFORMIN HCL 500 MG PO TABS: 500.0000 mg | ORAL_TABLET | Freq: Two times a day (BID) | ORAL | 0 refills | Status: DC

## 2018-03-06 NOTE — Discharge Instructions (Signed)
Take metformin as prescribed. Close follow up with family doctor. I contacted a case manager to help to find you follow up.

## 2018-03-06 NOTE — ED Provider Notes (Signed)
[facility] EMERGENCY DEPARTMENT Provider Note   CSN: [id number] Arrival date & time: 03/06/18  1810     History   Chief Complaint Chief Complaint  Patient presents with  . Exposure to STD    HPI Andrew [name] is a 52 y.o. male.  HPI Andrew [name] is a 52 y.o. male presents to emergency department with complaint of dizziness, night sweats, weakness, thinks he may have an STD.  He denies actually any penile discharge or dysuria, urinary frequency, hematuria, urgency.  He states he thinks he has an STD because he has a new partner.  He states he started feeling bad around the same time he got this partner.  He states his main complaint is night sweats and polydipsia.  He states he does not like water but has been having urge to drink lots of water recently.  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  He reports generalized weakness and had an episode of dizziness yesterday.  Denies any chest pain.  No shortness of breath.  No cough or congestion.  No dizziness at this time.  No changes in vision.  States his last blood work was done in January of this year and was normal.  Past Medical History:  Diagnosis Date  . Anxiety   . Arthritis    right knee  . Back pain    occasionally  . Bipolar disorder ([place])    denies  . COPD (chronic obstructive pulmonary disease) ([place])   . Depression   . Dizziness   . Generalized headaches    history of migraines-last one 02/02/14  . GERD (gastroesophageal reflux disease)    takes Prilosec daily as needed  . GERD (gastroesophageal reflux disease)   . Headache   . Hemorrhoids   . History of bronchitis    1 month ago and treated with Amoxicillin and given an inhaler;takes Prenisone  . Hyperlipidemia    takes Zetia daily  . Hypertension    takes HCTZ daily  . Joint pain   . Joint swelling   . Pneumonia    hx of 2 yrs ago  . Polysubstance abuse ([place])   . Schizoaffective  disorder ([place])    denies  . Tuberculosis    treated for 1 year  . Umbilical hernia     Patient Active Problem List   Diagnosis Date Noted  . Crack cocaine overdose ([place]) 09/28/2017  . Hypoglycemia 09/28/2017  . Hepatitis 09/28/2017  . Leucocytosis 09/28/2017  . Overdose 09/28/2017  . Arthritis of knee, degenerative 01/24/2015  . Right knee pain 01/07/2014  . Concern about STD in male without diagnosis 01/06/2014  . Dysuria 12/09/2013  . High cholesterol 10/25/2013  . GERD (gastroesophageal reflux disease) 09/20/2013  . History of positive PPD 07/17/2012  . Cocaine abuse ([place]) 04/02/2012  . Rhabdomyolysis 04/02/2012  . Hernia of abdominal wall 01/05/2011  . ERECTILE DYSFUNCTION, PSYCHOGENIC 02/13/2010  . BIPOLAR DISORDER UNSPECIFIED 01/31/2009  . TOBACCO ABUSE 01/31/2009  . ELEVATED BP READING WITHOUT DX HYPERTENSION 01/31/2009    Past Surgical History:  Procedure Laterality Date  . ESOPHAGOGASTRODUODENOSCOPY    . JOINT REPLACEMENT    . KNEE ARTHROSCOPY WITH MEDIAL MENISECTOMY Right 03/01/2014   Procedure: KNEE ARTHROSCOPY WITH MEDIAL MENISECTOMY;  Surgeon: Meredith Pel, MD;  Location: [facility];  Service: Orthopedics;  Laterality: Right;  . LIPOMA EXCISION  ~ 2010   "back left side of my  head"  . PARTIAL KNEE ARTHROPLASTY Right 01/24/2015   Procedure: RIGHT KNEE MEDIAL COMPARTMENT REPLACEMENT VS TOTAL KNEE ARTHROPLASTY.;  Surgeon: Meredith Pel, MD;  Location: [facility];  Service: Orthopedics;  Laterality: Right;  RIGHT KNEE MEDIAL COMPARTMENT REPLACEMENT VS TOTAL KNEE ARTHROPLASTY.  . TOTAL KNEE ARTHROPLASTY Right         Home Medications    Prior to Admission medications   Medication Sig Start Date End Date Taking? Authorizing Provider  diphenhydramine-acetaminophen (TYLENOL PM) 25-500 MG TABS tablet Take 1 tablet by mouth at bedtime as needed.    [provider]  feeding supplement, ENSURE ENLIVE, (ENSURE ENLIVE) LIQD Take 237 mLs by mouth 2 (two) times  daily between meals. 10/02/17   Bonnell Public, MD  ibuprofen (ADVIL,MOTRIN) 600 MG tablet Take 1 tablet (600 mg total) by mouth every 8 (eight) hours as needed for fever or moderate pain. 11/09/16   Quintella Reichert, MD  promethazine (PHENERGAN) 25 MG tablet Take 1 tablet (25 mg total) by mouth every 8 (eight) hours as needed for nausea or vomiting. 11/09/16   Quintella Reichert, MD    Family History Family History  Problem Relation Age of Onset  . Hypertension Mother   . Cancer Mother   . Heart disease Father   . Heart attack Father 27  . Hyperlipidemia Father   . Hypertension Father   . Diabetes Sister   . Diabetes Brother     Social History Social History   Tobacco Use  . Smoking status: Current Every Day Smoker    Packs/day: 0.25    Years: 30.00    Pack years: 7.50  . Smokeless tobacco: Never Used  Substance Use Topics  . Alcohol use: No    Frequency: Never    Comment: occasionally   . Drug use: Yes    Types: Marijuana, Cocaine    Comment: cocaine, night before last     Allergies   Mushroom extract complex; Penicillins; and Penicillins   Review of Systems Review of Systems  Constitutional: Positive for diaphoresis and fatigue. Negative for chills and fever.  Respiratory: Negative for cough, chest tightness and shortness of breath.   Cardiovascular: Negative for chest pain, palpitations and leg swelling.  Gastrointestinal: Negative for abdominal distention, abdominal pain, diarrhea, nausea and vomiting.  Genitourinary: Negative for dysuria, frequency, hematuria and urgency.  Musculoskeletal: Negative for arthralgias, myalgias, neck pain and neck stiffness.  Skin: Negative for rash.  Allergic/Immunologic: Negative for immunocompromised state.  Neurological: Positive for weakness and light-headedness. Negative for dizziness, numbness and headaches.  All other systems reviewed and are negative.    Physical Exam Updated Vital Signs BP 137/88   Pulse 82   Temp  98 F (36.7 C) (Oral)   Resp 20   Ht 5' 7.5" (1.715 m)   Wt 93.7 kg (206 lb 9.6 oz)   SpO2 96%   BMI 31.88 kg/m   Physical Exam  Constitutional: He appears well-developed and well-nourished. No distress.  HENT:  Head: Normocephalic and atraumatic.  Eyes: Conjunctivae are normal.  Neck: Neck supple.  Cardiovascular: Normal rate, regular rhythm and normal heart sounds.  Pulmonary/Chest: Effort normal. No respiratory distress. He has no wheezes. He has no rales.  Abdominal: Soft. Bowel sounds are normal. He exhibits no distension. There is no tenderness. There is no rebound.  Musculoskeletal: He exhibits no edema.  Neurological: He is alert.  Skin: Skin is warm and dry.  Nursing note and vitals reviewed.    ED Treatments /  Results  Labs (all labs ordered are listed, but only abnormal results are displayed) Labs Reviewed  URINALYSIS, ROUTINE W REFLEX MICROSCOPIC - Abnormal; Notable for the following components:      Result Value   Glucose, UA >=500 (*)    All other components within normal limits  URINALYSIS, MICROSCOPIC (REFLEX) - Abnormal; Notable for the following components:   Bacteria, UA RARE (*)    All other components within normal limits  COMPREHENSIVE METABOLIC PANEL - Abnormal; Notable for the following components:   Sodium 126 (*)    Chloride 94 (*)    CO2 21 (*)    Glucose, Bld 606 (*)    Creatinine, Ser 1.29 (*)    Calcium 8.8 (*)    Alkaline Phosphatase 165 (*)    All other components within normal limits  CBG MONITORING, ED - Abnormal; Notable for the following components:   Glucose-Capillary 581 (*)    All other components within normal limits  CBG MONITORING, ED - Abnormal; Notable for the following components:   Glucose-Capillary 494 (*)    All other components within normal limits  CBG MONITORING, ED - Abnormal; Notable for the following components:   Glucose-Capillary 400 (*)    All other components within normal limits  CBC WITH  DIFFERENTIAL/PLATELET  RPR  HIV ANTIBODY (ROUTINE TESTING)  GC/CHLAMYDIA PROBE AMP ([place]) NOT AT [facility])    EKG None  Radiology No results found.  Procedures Procedures (including critical care time)  Medications Ordered in ED Medications - No data to display   Initial Impression / Assessment and Plan / ED Course  I have reviewed the triage vital signs and the nursing notes.  Pertinent labs & imaging results that were available during my care of the patient were reviewed by me and considered in my medical decision making (see chart for details).     Patient emergency department still requesting to be checked for STDs although denies any penile discharge, no lesions or sores over perineum, no scrotal pain or swelling, no urinary symptoms.  He is complaining of generalized weakness, episode of dizziness, night sweats.  Urine analysis obtained in triage showed glucose greater than 500.  Suspect patient may have new onset of diabetes.  I will check basic labs, will still send gonorrhea and chlamydia, RPR, HIV.  10:14 PM Patient glucose was 606.  I have hydrated him with 2 L of IV saline.  Also given 8 units of insulin.  Sugars down to 400.  No evidence of DKA.  No ketonuria, normal anion gap.  Patient does not want to stay any longer to lower sugars even more.  He is requesting to be discharged.  I believe that his sugars probably have been high for a while, I do not think we need to abruptly lower his sugar at this time.  I will start him on metformin.  I spent a very long time sitting that with patient discussing diet choices.  We discussed good carbohydrate vs bad carbohydrates and importance of exercising.  I will discharge him home with metformin and close outpatient follow-up.  I sent a note to case manager to see if we can set him up with a family doctor.  We discussed close return precautions.  Vitals:   03/06/18 1825 03/06/18 1826 03/06/18 1838 03/06/18 2033  BP:  137/88   (!) 152/95  Pulse:  82  77  Resp:  20  18  Temp:  98 F (36.7 C)    TempSrc:  Oral    SpO2:  96%  99%  Weight:   93.7 kg (206 lb 9.6 oz)   Height: 5' 7.5" (1.715 m)        Final Clinical Impressions(s) / ED Diagnoses   Final diagnoses:  Hyperglycemia    ED Discharge Orders        Ordered    metFORMIN (GLUCOPHAGE) 500 MG tablet  2 times daily with meals     03/06/18 2211       Jeannett Senior, PA-C 03/06/18 2216    Margette Fast, MD 03/07/18 1055

## 2018-03-06 NOTE — ED Triage Notes (Signed)
States he thinks he has an STD.

## 2018-03-07 ENCOUNTER — Emergency Department (HOSPITAL_BASED_OUTPATIENT_CLINIC_OR_DEPARTMENT_OTHER)
Admission: EM | Admit: 2018-03-07 | Discharge: 2018-03-07 | Disposition: A | Discharge status: Home / Self Care | Payer: Medicare Other | Attending: Emergency Medicine | Admitting: Emergency Medicine

## 2018-03-07 ENCOUNTER — Encounter (HOSPITAL_BASED_OUTPATIENT_CLINIC_OR_DEPARTMENT_OTHER): Payer: Self-pay | Admitting: Emergency Medicine

## 2018-03-07 ENCOUNTER — Other Ambulatory Visit: Payer: Self-pay

## 2018-03-07 DIAGNOSIS — J449 Chronic obstructive pulmonary disease, unspecified: Secondary | ICD-10-CM | POA: Insufficient documentation

## 2018-03-07 DIAGNOSIS — R079 Chest pain, unspecified: Secondary | ICD-10-CM | POA: Diagnosis present

## 2018-03-07 DIAGNOSIS — R0789 Other chest pain: Secondary | ICD-10-CM | POA: Insufficient documentation

## 2018-03-07 DIAGNOSIS — R739 Hyperglycemia, unspecified: Secondary | ICD-10-CM | POA: Diagnosis not present

## 2018-03-07 DIAGNOSIS — Z79899 Other long term (current) drug therapy: Secondary | ICD-10-CM | POA: Insufficient documentation

## 2018-03-07 DIAGNOSIS — Z96651 Presence of right artificial knee joint: Secondary | ICD-10-CM | POA: Insufficient documentation

## 2018-03-07 DIAGNOSIS — I10 Essential (primary) hypertension: Secondary | ICD-10-CM | POA: Insufficient documentation

## 2018-03-07 DIAGNOSIS — E119 Type 2 diabetes mellitus without complications: Secondary | ICD-10-CM | POA: Diagnosis not present

## 2018-03-07 DIAGNOSIS — F17200 Nicotine dependence, unspecified, uncomplicated: Secondary | ICD-10-CM | POA: Diagnosis not present

## 2018-03-07 DIAGNOSIS — R358 Other polyuria: Secondary | ICD-10-CM | POA: Diagnosis not present

## 2018-03-07 DIAGNOSIS — R002 Palpitations: Secondary | ICD-10-CM | POA: Diagnosis not present

## 2018-03-07 DIAGNOSIS — E1165 Type 2 diabetes mellitus with hyperglycemia: Secondary | ICD-10-CM | POA: Diagnosis not present

## 2018-03-07 DIAGNOSIS — Z7984 Long term (current) use of oral hypoglycemic drugs: Secondary | ICD-10-CM | POA: Insufficient documentation

## 2018-03-07 HISTORY — DX: Type 2 diabetes mellitus without complications: E11.9

## 2018-03-07 LAB — RAPID URINE DRUG SCREEN, HOSP PERFORMED
AMPHETAMINES: NOT DETECTED
BENZODIAZEPINES: NOT DETECTED
Barbiturates: NOT DETECTED
Cocaine: NOT DETECTED
Opiates: NOT DETECTED
TETRAHYDROCANNABINOL: NOT DETECTED

## 2018-03-07 LAB — BASIC METABOLIC PANEL
Anion gap: 10 (ref 5–15)
BUN: 16 mg/dL (ref 6–20)
CHLORIDE: 100 mmol/L — AB (ref 101–111)
CO2: 21 mmol/L — AB (ref 22–32)
CREATININE: 0.72 mg/dL (ref 0.61–1.24)
Calcium: 9 mg/dL (ref 8.9–10.3)
GFR calc Af Amer: >60 mL/min (ref >60)
GFR calc non Af Amer: >60 mL/min (ref >60)
GLUCOSE: 276 mg/dL — AB (ref 65–99)
POTASSIUM: 4.2 mmol/L (ref 3.5–5.1)
Sodium: 131 mmol/L — ABNORMAL LOW (ref 135–145)

## 2018-03-07 LAB — CBC
HCT: 40.5 % (ref 39.0–52.0)
HEMOGLOBIN: 14.3 g/dL (ref 13.0–17.0)
MCH: 30.8 pg (ref 26.0–34.0)
MCHC: 35.3 g/dL (ref 30.0–36.0)
MCV: 87.3 fL (ref 78.0–100.0)
Platelets: 264 10*3/uL (ref 150–400)
RBC: 4.64 MIL/uL (ref 4.22–5.81)
RDW: 12.4 % (ref 11.5–15.5)
WBC: 7.1 10*3/uL (ref 4.0–10.5)

## 2018-03-07 LAB — CBG MONITORING, ED
GLUCOSE-CAPILLARY: 220 mg/dL — AB (ref 65–99)
Glucose-Capillary: 268 mg/dL — ABNORMAL HIGH (ref 65–99)

## 2018-03-07 LAB — TROPONIN I: Troponin I: <0.03 ng/mL (ref <0.03)

## 2018-03-07 NOTE — Discharge Instructions (Addendum)
You have diabetes, and your blood sugars may be high until you are able to pick up your medication. You should return to the ED if you have further chest pain or changes in thinking or abdominal pain and vomiting. Below are some food recommendations as you requested.   Diet Recommendations for Diabetes   Starchy (carb) foods: Bread, rice, pasta, potatoes, corn, cereal, grits, crackers, bagels, muffins, all baked goods.  (Fruits, milk, and yogurt also have carbohydrate, but most of these foods will not spike your blood sugar as most starchy foods will.)  A few fruits do cause high blood sugars; use small portions of bananas (limit to 1/2 at a time), grapes, watermelon, oranges, and most tropical fruits.    Protein foods: Meat, fish, poultry, eggs, dairy foods, and beans such as pinto and kidney beans (beans also provide carbohydrate).   1. Eat at least 3 meals and 1-2 snacks per day. Never go more than 4-5 hours while awake without eating. Eat breakfast within the first hour of getting up.   2. Limit starchy foods to TWO per meal and ONE per snack. ONE portion of a starchy  food is equal to the following:   - ONE slice of bread (or its equivalent, such as half of a hamburger bun).   - 1/2 cup of a "scoopable" starchy food such as potatoes or rice.   - 15 grams of Total Carbohydrate as shown on food label.  3. Include at every meal: a protein food, a carb food, and vegetables and/or fruit.   - Obtain twice the volume of veg's as protein or carbohydrate foods for both lunch and dinner.   - Fresh or frozen veg's are best.   - Keep frozen veg's on hand for a quick vegetable serving.

## 2018-03-07 NOTE — ED Triage Notes (Signed)
Pt is from [facility]. C/o hyperglycemia and chest pain. Chest pain started yesterday. Glucose was 307 this morning. Was seen here yesterday. Pt states [facility] does not have his medication.

## 2018-03-07 NOTE — ED Provider Notes (Signed)
[facility] EMERGENCY DEPARTMENT Provider Note   CSN: [id number] Arrival date & time: 03/07/18  1210     History   Chief Complaint Chief Complaint  Patient presents with  . Hyperglycemia  . Chest Pain    HPI Andrew [name] is a 52 y.o. male.  HPI  Chest Pain: Patient complains of chest pain. Onset was at 3am this morning, intermittent course since that time. The patient describes the pain as intermittent, sharp and pinching in nature, does not radiate.  Associated symptoms are none. Aggravating factors are sitting.  Alleviating factors are: none. Patient's cardiac risk factors are diabetes mellitus, male gender and obesity (BMI >= 30 kg/m2).  Patient's risk factors for DVT/PE: none. Describes "fluttering." no hx of similar, no previous MI per his report. No new exercise or heavy lifting.   Elevated BG - states [facility] was worried about his CBG being >100s. Was here last night with likely new DM diagnosis. +polyuria, polydipsia.    Past Medical History:  Diagnosis Date  . Anxiety   . Arthritis    right knee  . Back pain    occasionally  . Bipolar disorder ([place])    denies  . COPD (chronic obstructive pulmonary disease) ([place])   . Depression   . Diabetes mellitus without complication ([place])   . Dizziness   . Generalized headaches    history of migraines-last one 02/02/14  . GERD (gastroesophageal reflux disease)    takes Prilosec daily as needed  . GERD (gastroesophageal reflux disease)   . Headache   . Hemorrhoids   . History of bronchitis    1 month ago and treated with Amoxicillin and given an inhaler;takes Prenisone  . Hyperlipidemia    takes Zetia daily  . Hypertension    takes HCTZ daily  . Joint pain   . Joint swelling   . Pneumonia    hx of 2 yrs ago  . Polysubstance abuse ([place])   . Schizoaffective disorder ([place])    denies  . Tuberculosis    treated for 1 year  . Umbilical hernia     Patient Active Problem List   Diagnosis Date Noted  .  Crack cocaine overdose ([place]) 09/28/2017  . Hypoglycemia 09/28/2017  . Hepatitis 09/28/2017  . Leucocytosis 09/28/2017  . Overdose 09/28/2017  . Arthritis of knee, degenerative 01/24/2015  . Right knee pain 01/07/2014  . Concern about STD in male without diagnosis 01/06/2014  . Dysuria 12/09/2013  . High cholesterol 10/25/2013  . GERD (gastroesophageal reflux disease) 09/20/2013  . History of positive PPD 07/17/2012  . Cocaine abuse ([place]) 04/02/2012  . Rhabdomyolysis 04/02/2012  . Hernia of abdominal wall 01/05/2011  . ERECTILE DYSFUNCTION, PSYCHOGENIC 02/13/2010  . BIPOLAR DISORDER UNSPECIFIED 01/31/2009  . TOBACCO ABUSE 01/31/2009  . ELEVATED BP READING WITHOUT DX HYPERTENSION 01/31/2009    Past Surgical History:  Procedure Laterality Date  . ESOPHAGOGASTRODUODENOSCOPY    . JOINT REPLACEMENT    . KNEE ARTHROSCOPY WITH MEDIAL MENISECTOMY Right 03/01/2014   Procedure: KNEE ARTHROSCOPY WITH MEDIAL MENISECTOMY;  Surgeon: Meredith Pel, MD;  Location: [facility];  Service: Orthopedics;  Laterality: Right;  . LIPOMA EXCISION  ~ 2010   "back left side of my head"  . PARTIAL KNEE ARTHROPLASTY Right 01/24/2015   Procedure: RIGHT KNEE MEDIAL COMPARTMENT REPLACEMENT VS TOTAL KNEE ARTHROPLASTY.;  Surgeon: Meredith Pel, MD;  Location: [facility];  Service: Orthopedics;  Laterality: Right;  RIGHT KNEE MEDIAL COMPARTMENT REPLACEMENT VS TOTAL KNEE ARTHROPLASTY.  Marland Kitchen  TOTAL KNEE ARTHROPLASTY Right         Home Medications    Prior to Admission medications   Medication Sig Start Date End Date Taking? Authorizing Provider  diphenhydramine-acetaminophen (TYLENOL PM) 25-500 MG TABS tablet Take 1 tablet by mouth at bedtime as needed.    [provider]  feeding supplement, ENSURE ENLIVE, (ENSURE ENLIVE) LIQD Take 237 mLs by mouth 2 (two) times daily between meals. 10/02/17   Bonnell Public, MD  ibuprofen (ADVIL,MOTRIN) 600 MG tablet Take 1 tablet (600 mg total) by mouth every 8  (eight) hours as needed for fever or moderate pain. 11/09/16   Quintella Reichert, MD  metFORMIN (GLUCOPHAGE) 500 MG tablet Take 1 tablet (500 mg total) by mouth 2 (two) times daily with a meal. 03/06/18   Kirichenko, Lahoma Rocker, PA-C  promethazine (PHENERGAN) 25 MG tablet Take 1 tablet (25 mg total) by mouth every 8 (eight) hours as needed for nausea or vomiting. 11/09/16   Quintella Reichert, MD    Family History Family History  Problem Relation Age of Onset  . Hypertension Mother   . Cancer Mother   . Heart disease Father   . Heart attack Father 20  . Hyperlipidemia Father   . Hypertension Father   . Diabetes Sister   . Diabetes Brother     Social History Social History   Tobacco Use  . Smoking status: Current Every Day Smoker    Packs/day: 0.25    Years: 30.00    Pack years: 7.50  . Smokeless tobacco: Never Used  Substance Use Topics  . Alcohol use: No    Frequency: Never    Comment: occasionally   . Drug use: Yes    Types: Marijuana, Cocaine    Comment: cocaine, night before last     Allergies   Mushroom extract complex; Penicillins; and Penicillins   Review of Systems Review of Systems  Constitutional: Negative for activity change and fever.  HENT: Negative for congestion.   Respiratory: Negative for cough and chest tightness.   Cardiovascular: Positive for chest pain and palpitations. Negative for leg swelling.  Gastrointestinal: Negative for abdominal distention and abdominal pain.  Endocrine: Positive for polydipsia and polyuria.  Genitourinary: Negative for dysuria and flank pain.  Neurological: Negative for dizziness.  Psychiatric/Behavioral: Negative for agitation.     Physical Exam Updated Vital Signs BP 130/83 (BP Location: Left Arm)   Pulse 80   Temp 98.2 F (36.8 C) (Oral)   Resp 18   Ht 5' 7.5" (1.715 m)   Wt 93.4 kg (206 lb)   SpO2 98%   BMI 31.79 kg/m   Physical Exam  Constitutional: He is oriented to person, place, and time. He appears  well-developed and well-nourished.  HENT:  Head: Normocephalic and atraumatic.  Eyes: EOM are normal.  Neck: Normal range of motion.  Cardiovascular: Normal rate, regular rhythm and normal pulses.  No murmur heard. Pulmonary/Chest: Effort normal and breath sounds normal.  Abdominal: Soft. Bowel sounds are normal. He exhibits no distension.  Musculoskeletal: Normal range of motion.       Right lower leg: He exhibits no edema.       Left lower leg: He exhibits no edema.  Neurological: He is alert and oriented to person, place, and time.  Skin: Skin is warm and dry.  Psychiatric: He has a normal mood and affect.     ED Treatments / Results  Labs (all labs ordered are listed, but only abnormal results are displayed) Labs  Reviewed  CBG MONITORING, ED - Abnormal; Notable for the following components:      Result Value   Glucose-Capillary 268 (*)    All other components within normal limits  CBC  BASIC METABOLIC PANEL  TROPONIN I  RAPID URINE DRUG SCREEN, HOSP PERFORMED    EKG EKG Interpretation  Date/Time:  Saturday March 07 2018 12:21:43 EDT Ventricular Rate:  75 PR Interval:    QRS Duration: 83 QT Interval:  358 QTC Calculation: 400 R Axis:   3 Text Interpretation:  Sinus rhythm Normal sinus rhythm No significant change since last tracing Confirmed by Thomasene Lot, Courteney [phone]) on 03/07/2018 12:40:02 PM   Radiology No results found.  Procedures Procedures (including critical care time)  Medications Ordered in ED Medications - No data to display   Initial Impression / Assessment and Plan / ED Course  I have reviewed the triage vital signs and the nursing notes.  Pertinent labs & imaging results that were available during my care of the patient were reviewed by me and considered in my medical decision making (see chart for details).     Given elevated Cr on 5/31 and hyperglycemia, will repeat CBC BMP. Trop for CP. EKG unchanged from previous. Delta trop negative.     Final Clinical Impressions(s) / ED Diagnoses   Final diagnoses:  None    ED Discharge Orders    None     Ralene Ok, MD PGY2 FM   Sela Hilding, MD 03/08/18 [id number]    Macarthur Critchley, MD 03/08/18 [phone]

## 2018-03-07 NOTE — ED Notes (Signed)
Patient given frozen dinner and diet soda

## 2018-03-08 LAB — RPR: RPR Ser Ql: NONREACTIVE

## 2018-03-08 LAB — HIV ANTIBODY (ROUTINE TESTING W REFLEX): HIV Screen 4th Generation wRfx: NONREACTIVE

## 2018-03-09 ENCOUNTER — Other Ambulatory Visit: Payer: Self-pay

## 2018-03-09 LAB — GC/CHLAMYDIA PROBE AMP (~~LOC~~) NOT AT ARMC
CHLAMYDIA, DNA PROBE: NEGATIVE
NEISSERIA GONORRHEA: NEGATIVE

## 2018-03-09 NOTE — Patient Outreach (Signed)
[facility]) Care Management  03/09/2018  EWALD BEG May 20, 1966 [id number]  Referral Date: 03/09/18 Referral Source: Hospital referral Referral Reason: Need help finding PCP and DM management   Outreach Attempt #1 Telephone call to [facility].  Spoke with Anderson Malta she states they are unable to give information but would transfer CM to Counselor.  Left voice mail with Rivka Barbara at [facility].  Requested return call to CM.    Telephone call to other number listed [phone].  HIPAA compliant voice message left.    Incoming call before completing note.  Male states that patient is not available but will give him the number to call back.      Plan: RN CM will send letter and attempt again within 4 business days.   [name], RN, MSN [facility] Care Management Care Management Coordinator Direct Line [phone] Toll Free: [phone]  Fax: [phone]

## 2018-03-09 NOTE — Patient Outreach (Signed)
[facility]) Care Management  03/09/2018  Andrew [name] 08/03/1966 [id number]   Telephone call to [facility].  Spoke with [name] to inquire about establishing a PCP there.  She states that all the slots for new patients are already filled for the month and that it is best to call around the 24th of the month to get a slot for the next month.  Plan: RN CM will wait return call from patient to offer other alternatives to getting a PCP.  [name], RN, MSN [facility] Care Management Care Management Coordinator Direct Line [phone] Toll Free: [phone]  Fax: [phone]

## 2018-03-09 NOTE — Care Management Note (Signed)
Case Management Note  CM was sent a message requesting help with getting the pt established with a PCP with a likely new dx of DM.  CM contacted [facility] for referral to further assist pt and also provide DM education.  They will contact pt.  Updated Kirichenko, PA via messages.  No further CM needs noted at this time.  09:38 03/09/2018

## 2018-03-11 ENCOUNTER — Other Ambulatory Visit: Payer: Self-pay

## 2018-03-11 NOTE — Patient Outreach (Signed)
[facility]) Care Management  03/11/2018  EDIE DARLEY 11-20-1965 [id number]   Referral Date: 03/09/18 Referral Source: Hospital referral Referral Reason: Need help finding PCP and DM management  2nd telephone call to patient via [facility].  Spoke with Counselor Rivka Barbara.  She states that patient has left treatment as of this past weekend and no longer in contact with patient.  Telephone call to other number listed.  Male answered stating patient not available. She states she gave patient the information.  Asked that if she sees the patient to give CM information.  She states that she would.    Plan: RN CM will wait patient return call. If no return call will close case.    [name], RN, MSN [facility] Care Management Care Management Coordinator Direct Line [phone] Toll Free: [phone]  Fax: [phone]

## 2018-03-11 NOTE — Patient Outreach (Signed)
Andrew [name]) Care Management  03/11/2018  Andrew [name] Sep 12, 1966 [id number]   Referral Date:03/09/18 Referral Source:Hospital referral Referral Reason:Need help finding PCP and DM management   Outreach: Incoming call from patient.  He is able to verify HIPAA.  Patient states that he working to get a primary care physician and that he had gone to [facility] and they cannot see him for a month.  Gave patient information for free physician referral.  Asked patient to call to get a referral to a doctor and to call CM back when he has completed the task. Patient verified he left the [facility] program and is living with a friend.  Patient voices some concerns about housing and transportation also.  CM explained to patient the importance of finding a PCP to continue service with [facility].  He verbalized understanding.     Social: Patient lives with friend but expressed some need for housing.  Patient reports that he is independent with all aspects of care.   Conditions:  Patient admits only to recent diagnosis of diabetes.  Patient is trying to get established with a primary care physician.  Patient expresses concern about his diabetes and wants to get it under control.  Patient reports he has no meter to check his sugars as well.  Expressed to again the importance of getting established with a primary care physician.     Medications:  Patient reports he takes his medications as prescribed but patient worried about running out of medications.  Reiterated with patient the importance of getting established with a primary care physician.  He verbalized understanding.     Appointments:  Patient has no appointments at this time but does express some concern about transportation.     Advanced Directives:  Patient does not have an advanced directive.     Plan: Patient to follow up with physician referral line and follow up with CM on outcome. RN CM will wait return call  from patient.     [name], RN, MSN [facility] Care Management Care Management Coordinator Direct Line [phone] Toll Free: [phone]  Fax: [phone]

## 2018-03-12 ENCOUNTER — Ambulatory Visit (INDEPENDENT_AMBULATORY_CARE_PROVIDER_SITE_OTHER): Payer: Medicare Other | Admitting: Medical

## 2018-03-12 ENCOUNTER — Encounter: Payer: Self-pay | Admitting: Medical

## 2018-03-12 VITALS — BP 125/79 | HR 92 | Temp 98.3°F | Resp 16 | Ht 67.0 in | Wt 202.6 lb

## 2018-03-12 DIAGNOSIS — F319 Bipolar disorder, unspecified: Secondary | ICD-10-CM

## 2018-03-12 DIAGNOSIS — E785 Hyperlipidemia, unspecified: Secondary | ICD-10-CM | POA: Diagnosis not present

## 2018-03-12 DIAGNOSIS — E119 Type 2 diabetes mellitus without complications: Secondary | ICD-10-CM | POA: Diagnosis not present

## 2018-03-12 DIAGNOSIS — F17200 Nicotine dependence, unspecified, uncomplicated: Secondary | ICD-10-CM

## 2018-03-12 DIAGNOSIS — E1169 Type 2 diabetes mellitus with other specified complication: Secondary | ICD-10-CM

## 2018-03-12 DIAGNOSIS — R5383 Other fatigue: Secondary | ICD-10-CM

## 2018-03-12 MED ORDER — METFORMIN HCL 1000 MG PO TABS: 1000.0000 mg | ORAL_TABLET | Freq: Two times a day (BID) | ORAL | 3 refills | Status: DC

## 2018-03-12 NOTE — Progress Notes (Signed)
Subjective:    Patient ID: Andrew [name], male    DOB: 07/03/1966, 52 y.o.   MRN: [id number]  HPI  Pt in for first time in primary care office. Pt had recent severe high sugars end of Mar 06, 2018.   Pt is disabled, pt has not  been exercising, no alcohol. No marijuana use. No narcotic use. Pt attends both AA and NA.  He went to ED on 03-06-2018. He went to  emergency department with complaint of dizziness, night sweats, and weakness. Also was urinating a lot. Pt was placed on metformin 500 mg bid. He has made diet changes.  Pt no longer has hyperglycemic signs and symptoms.  Pt sugar to 223.   Pt has history of cocaine use but none recently. Drug screen in ED was negative.  No cardiac or neurologic signs or symptoms today.  Pt smoker. Currently 2 cigarettes a day.  Hx of bipolar and schizoaffective. Pt is on zyprexa, sertraline and Desyrel. Pt sees [facility].They are writing his medications.  History of htn. Not on current medication.   History high cholsterol. Not on meds.  History of TB. Pt was treated and followed by health dept.     Review of Systems  Constitutional: Positive for fatigue. Negative for chills and fever.  HENT: Negative for congestion, ear discharge, ear pain, facial swelling and postnasal drip.   Respiratory: Negative for cough, chest tightness, shortness of breath and wheezing.   Cardiovascular: Negative for chest pain and palpitations.  Gastrointestinal: Negative for abdominal distention, abdominal pain, diarrhea, rectal pain and vomiting.  Musculoskeletal: Negative for arthralgias, back pain, joint swelling, neck pain and neck stiffness.  Skin: Negative for rash.  Hematological: Negative for adenopathy. Does not bruise/bleed easily.  Psychiatric/Behavioral: Negative for behavioral problems, confusion, dysphoric mood and suicidal ideas. The patient is not nervous/anxious and is not hyperactive.     Past Medical History:  Diagnosis Date  . Anxiety     . Arthritis    right knee  . Back pain    occasionally  . Bipolar disorder ([place])    denies  . COPD (chronic obstructive pulmonary disease) ([place])   . Depression   . Diabetes mellitus without complication ([place])   . Dizziness   . Generalized headaches    history of migraines-last one 02/02/14  . GERD (gastroesophageal reflux disease)    takes Prilosec daily as needed  . GERD (gastroesophageal reflux disease)   . Headache   . Hemorrhoids   . History of bronchitis    1 month ago and treated with Amoxicillin and given an inhaler;takes Prenisone  . Hyperlipidemia    takes Zetia daily  . Hypertension    takes HCTZ daily  . Joint pain   . Joint swelling   . Pneumonia    hx of 2 yrs ago  . Polysubstance abuse ([place])   . Schizoaffective disorder ([place])    denies  . Tuberculosis    treated for 1 year  . Umbilical hernia      Social History   Socioeconomic History  . Marital status: Single    Spouse name: Not on file  . Number of children: Not on file  . Years of education: Not on file  . Highest education level: Not on file  Occupational History  . Occupation: lifts and pulls concrete    Employer: [employer]   Social Needs  . Financial resource strain: Not on file  . Food insecurity:    Worry: Not  on file    Inability: Not on file  . Transportation needs:    Medical: Not on file    Non-medical: Not on file  Tobacco Use  . Smoking status: Current Every Day Smoker    Packs/day: 0.25    Years: 30.00    Pack years: 7.50  . Smokeless tobacco: Never Used  Substance and Sexual Activity  . Alcohol use: No    Frequency: Never    Comment: occasionally   . Drug use: Yes    Types: Marijuana, Cocaine    Comment: cocaine, night before last  . Sexual activity: Yes  Lifestyle  . Physical activity:    Days per week: Not on file    Minutes per session: Not on file  . Stress: Not on file  Relationships  . Social connections:    Talks on phone: Not on file    Gets together:  Not on file    Attends religious service: Not on file    Active member of club or organization: Not on file    Attends meetings of clubs or organizations: Not on file    Relationship status: Not on file  . Intimate partner violence:    Fear of current or ex partner: Not on file    Emotionally abused: Not on file    Physically abused: Not on file    Forced sexual activity: Not on file  Other Topics Concern  . Not on file  Social History Narrative   ** Merged History Encounter **       Lives with girlfriend. Work involves a lot of pulling and lifting concrete, was not definite about type of work. Denies use of illegal drugs. Smokes half pack of cigarettes daily.    Past Surgical History:  Procedure Laterality Date  . ESOPHAGOGASTRODUODENOSCOPY    . JOINT REPLACEMENT    . KNEE ARTHROSCOPY WITH MEDIAL MENISECTOMY Right 03/01/2014   Procedure: KNEE ARTHROSCOPY WITH MEDIAL MENISECTOMY;  Surgeon: Meredith Pel, MD;  Location: [facility];  Service: Orthopedics;  Laterality: Right;  . LIPOMA EXCISION  ~ 2010   "back left side of my head"  . PARTIAL KNEE ARTHROPLASTY Right 01/24/2015   Procedure: RIGHT KNEE MEDIAL COMPARTMENT REPLACEMENT VS TOTAL KNEE ARTHROPLASTY.;  Surgeon: Meredith Pel, MD;  Location: [facility];  Service: Orthopedics;  Laterality: Right;  RIGHT KNEE MEDIAL COMPARTMENT REPLACEMENT VS TOTAL KNEE ARTHROPLASTY.  . TOTAL KNEE ARTHROPLASTY Right     Family History  Problem Relation Age of Onset  . Hypertension Mother   . Cancer Mother   . Heart disease Father   . Heart attack Father 66  . Hyperlipidemia Father   . Hypertension Father   . Diabetes Sister   . Diabetes Brother     Allergies  Allergen Reactions  . Mushroom Extract Complex Hives  . Penicillins Hives  . Penicillins Hives    Has patient had a PCN reaction causing immediate rash, facial/tongue/throat swelling, SOB or lightheadedness with hypotension: no Has patient had a PCN reaction causing severe rash  involving mucus membranes or skin necrosis: no Has patient had a PCN reaction that required hospitalization: no Has patient had a PCN reaction occurring within the last 10 years: yes If all of the above answers are "NO", then may proceed with Cephalosporin use.    Current Outpatient Medications on File Prior to Visit  Medication Sig Dispense Refill  . OLANZapine (ZYPREXA) 5 MG tablet Take 5 mg by mouth at bedtime.    Marland Kitchen  sertraline (ZOLOFT) 50 MG tablet Take 50 mg by mouth daily.    . traZODone (DESYREL) 50 MG tablet Take 50 mg by mouth at bedtime.     No current facility-administered medications on file prior to visit.     BP 125/79   Pulse 92   Temp 98.3 F (36.8 C) (Oral)   Resp 16   Ht 5\' 7"  (1.702 m)   Wt 202 lb 9.6 oz (91.9 kg)   SpO2 99%   BMI 31.73 kg/m       Objective:   Physical Exam  General Mental Status- Alert. General Appearance- Not in acute distress.   Skin General: Color- Normal Color. Moisture- Normal Moisture.  Neck Carotid Arteries- Normal color. Moisture- Normal Moisture. No carotid bruits. No JVD.  Chest and Lung Exam Auscultation: Breath Sounds:-Normal.  Cardiovascular Auscultation:Rythm- Regular. Murmurs & Other Heart Sounds:Auscultation of the heart reveals- No Murmurs.  Abdomen Inspection:-Inspeection Normal. Palpation/Percussion:Note:No mass. Palpation and Percussion of the abdomen reveal- Non Tender, Non Distended + BS, no rebound or guarding.    Neurologic Cranial Nerve exam:- CN III-XII intact(No nystagmus), symmetric smile. Strength:- 5/5 equal and symmetric strength both upper and lower extremities.      Assessment & Plan:  For your diabetes, please start a low sugar diet as we discussed.  Also recommend daily exercise.  I am putting in future labs to include A1c and lipid panel.  Please schedule those labs today.  You will need to be fasting on the day of that lab draw.  I am increasing your metformin to 1000 mg twice daily.   Keep in mind that want to offer you referral to a diabetic specialist/nutritionist.  You declined that today but if you change your mind please let me know.  For history of bipolar, depression and anxiety, please stay on your medications.  Continue to follow-up with [facility].  For fatigue, I am placing future labs that would include TSH, CBC, B12, B1 and vitamin D level.  Please stop smoking. Continue to attend both AA and NA.  Follow-up in 2 weeks or as needed.  Also reminder please call your insurance and find out which glucometer they prefer or would base and you free glucometer.  If you get the name of their preferred glucometer then we could write a prescription.  I am asking Delana Meyer today to see if we have glucometer we can give you during the interim.  If not then you could use Relion brand glucometer(purchase at [place])  [name], PA-C

## 2018-03-12 NOTE — Patient Instructions (Signed)
For your diabetes, please start a low sugar diet as we discussed.  Also recommend daily exercise.  I am putting in future labs to include A1c and lipid panel.  Please schedule those labs today.  You will need to be fasting on the day of that lab draw.  I am increasing your metformin to 1000 mg twice daily.  Keep in mind that want to offer you referral to a diabetic specialist/nutritionist.  You declined that today but if you change your mind please let me know.  For history of bipolar, depression and anxiety, please stay on your medications.  Continue to follow-up with [facility].  For fatigue, I am placing future labs that would include TSH, CBC, B12, B1 and vitamin D level.  Please stop smoking. Continue to attend both AA and NA.  Follow-up in 2 weeks or as needed.  Also reminder please call your insurance and find out which glucometer they prefer or would base and you free glucometer.  If you get the name of their preferred glucometer then we could write a prescription.  I am asking Andrew [name] today to see if we have glucometer we can give you during the interim.  If not then you could use Relion brand glucometer(purchase at [place])

## 2018-03-13 ENCOUNTER — Other Ambulatory Visit: Payer: Self-pay

## 2018-03-13 NOTE — Patient Outreach (Signed)
[place] [facility]) Care Management  03/13/2018  Andrew [name] 1965-10-15 [id number]    Incoming call from patient.  He is able to verify HIPAA.  Patient proud to report that he has a primary care physician and that he saw him on yesterday as CM requested.  Updated patient information with primary care physician. Patient expressing some concern about his newly diagnosed state of diabetes. Patient states that the doctor gave him a meter on yesterday but states he only has ten strips and that he cannot afford the strips that the doctor prescribed him.  Discussed with patient alternative of [employer] [facility] at [facility] for strips.  He verbalized understanding.  Patient also expressed needing help with his diabetes such as testing and managing.  Discussed with patient the importance of diabetes and self care.  He verbalized understanding.    Discussed the patient [place] [facility] and having a nurse to see him with his newly diagnosed condition.  Patient is agreeable.  Patient reports that he is staying at a shelter in [facility] but they have an area where he meets his counselor for visits.  Patient currently at [address] in [place] but asked that CM keep his mailing address the same as he cannot receive mail where he presently is.    Patient also looking for housing assistance with transportation.  He is agreeable to Social Work referral to assist.    Patient states that he currently goes to [employer] in the mornings but is available after 8:30 am.   Plan RN CM wil refer patient to Designer, television/film set and Social Work.   [name], RN, MSN [facility] Care Management Care Management Coordinator Direct Line [phone] Toll Free: [phone]  Fax: [phone]

## 2018-03-16 ENCOUNTER — Other Ambulatory Visit: Payer: Self-pay | Admitting: *Deleted

## 2018-03-16 NOTE — Patient Outreach (Addendum)
[place] [facility]) Care Management  03/16/2018  Andrew [name] June 12, 1966 [id number]   Telephone Assessment  RN spoke with pt today and introduced the [facility] services and the purpose for today's call. Pt explains his situation with living in a shelter. Pt states he just needs to learn how to monitor his CBGs but states the strips were to expensive. RN inquired on the medication as pt states he is on Metformin 500 mg twice daily but will convert over the 1000 mg when finish with the 500 mg. Assistance offered for the following inquires:   MCD benefits for Housing- RN provided pt with the Social Services office contact number to reach his case worker via MCD benefits for possible housing voucher. Diabetic supplies- Will refer pt to [facility] to assist with obtaining prescriptions for supplies needed for Diabetes monitoring. Diabetic shoes-RN provided pt with the contact number for [employer] Roselyn Reef) to request the forms needed for pt's provider to complete.  Pt will have a follow up appointment with his primary provider for additional prescriptions for diabetic supplies. RN offered to make a referral to [facility] to assist with obtaining the prescriptions needed for daily monitoring of his diabetes. RN encouraged pt to fill the prescriptions in order for RN to teach and educate pt on use of his meter. Will follow next week on supplies and became with teachings on his meter for diabetic monitoring. Will update the assigned pharmacy of pt's situation.   Raina Mina, RN Care Management Coordinator [place] Office [phone]

## 2018-03-17 ENCOUNTER — Telehealth: Payer: Self-pay | Admitting: Medical

## 2018-03-17 ENCOUNTER — Telehealth: Payer: Self-pay | Admitting: Pharmacist

## 2018-03-17 NOTE — Patient Outreach (Signed)
[facility]) Care Management  [place]   03/17/2018  Andrew [name] June 27, 1966 [id number]  Subjective: Patient was called regarding medication assistance with diabetic testing supplies per referral.  Patient's name and DOB were confirmed he is currently housing insecure and living in a shelter.  Patient is a 52 year old male with a bipolar disorder who was recently diagnosed with type 2 diabetes.  Patient saw Andrew Pai, PA-C to establish care on 03/12/18.  Patient has not been able to check his blood sugar because he does not have a prescription for a meter and supplies. He cannot afford to purchase the supplies without his insurance.   Patient went to the ED on 03/06/18 with complaints of dizziness, night sweats, weakness and increased urination.  Objective:   Encounter Medications: Outpatient Encounter Medications as of 03/17/2018  Medication Sig  . metFORMIN (GLUCOPHAGE) 1000 MG tablet Take 1 tablet (1,000 mg total) by mouth 2 (two) times daily with a meal.  . OLANZapine (ZYPREXA) 5 MG tablet Take 5 mg by mouth at bedtime.  . sertraline (ZOLOFT) 50 MG tablet Take 50 mg by mouth daily.  . traZODone (DESYREL) 50 MG tablet Take 50 mg by mouth at bedtime.   No facility-administered encounter medications on file as of 03/17/2018.     Functional Status: In your present state of health, do you have any difficulty performing the following activities: 09/28/2017  Hearing? N  Vision? N  Difficulty concentrating or making decisions? N  Walking or climbing stairs? N  Dressing or bathing? N  Doing errands, shopping? N  Some recent data might be hidden    Fall/Depression Screening: No flowsheet data found. PHQ 2/9 Scores 03/11/2018  PHQ - 2 Score 1      Assessment: Patient's medications were reviewed via telephone.   Drugs sorted by system:  Neurologic/Psychologic: Olanzapine Sertraline Trazodone  Endocrine: Metformin    Patient reported he has  been unable to test his blood sugar because he does not have a meter. Andrew [name]'s office was called and a message was left requesting new prescriptions for diabetic testing supplies (One Touch Verio meter, strips, and lancets be sent to [facility] in [place].  The message reminded the provider to include the diagnosis code and the number of testing times on the prescriptions.  Plan: Follow up on the prescriptions tomorrow.   Andrew [name], PharmD, [place] Clinical Pharmacist [phone]

## 2018-03-17 NOTE — Telephone Encounter (Signed)
Copied from [place] [id number]. Topic: Quick Communication - Rx Refill/Question >> Mar 17, 2018 11:15 AM Synthia Innocent wrote: Medication: one touch verio, meter, lancets, strips, RX needs DX code and number of times a day testing  Has the patient contacted their pharmacy? No, new RX (Agent: If no, request that the patient contact the pharmacy for the refill.) (Agent: If yes, when and what did the pharmacy advise?)  Preferred Pharmacy (with phone number or street name): [facility] [address]  Agent: Please be advised that RX refills may take up to 3 business days. We ask that you follow-up with your pharmacy.

## 2018-03-18 ENCOUNTER — Other Ambulatory Visit: Payer: Self-pay | Admitting: Pharmacist

## 2018-03-18 NOTE — Patient Outreach (Signed)
[facility] [facility]) Care Management  03/18/2018  Andrew [name] 1965-12-03 [id number]   Patient was called regarding medication assistance with diabetic testing supplies.  HIPAA identifiers were obtained.  [facility] was called and they said supplies had not been called in yet.  Patient's chart was reviewed there is an open note about the need for a new prescription for testing supplies that is still open.    Plan: Follow up on strips and supplies in 1-2 business days.    [name], PharmD, [place] Clinical Pharmacist [phone]

## 2018-03-18 NOTE — Telephone Encounter (Signed)
Patient needs new meter with supplies and instructions/diagnosis.

## 2018-03-19 ENCOUNTER — Ambulatory Visit: Payer: Self-pay | Admitting: Pharmacist

## 2018-03-19 NOTE — Telephone Encounter (Signed)
Will you call pt pharmacy and order his supplies. Andrew [name] note list type of glucometer. Have him check sugars 3 times daily. Also remind him that I want him to get labs that I ordered. Wanted him to get those fasting after 8 hours so can get lipid panel. Explain to him also will get a1c to see how high sugars have been running over past 3 months.

## 2018-03-19 NOTE — Telephone Encounter (Signed)
Called in glucose meter and supplies into pharmacy.

## 2018-03-20 ENCOUNTER — Encounter: Payer: Self-pay | Admitting: Medical

## 2018-03-20 ENCOUNTER — Telehealth: Payer: Self-pay | Admitting: Medical

## 2018-03-20 ENCOUNTER — Other Ambulatory Visit: Payer: Self-pay | Admitting: Pharmacist

## 2018-03-20 ENCOUNTER — Ambulatory Visit (INDEPENDENT_AMBULATORY_CARE_PROVIDER_SITE_OTHER): Payer: Medicare Other | Admitting: Medical

## 2018-03-20 ENCOUNTER — Other Ambulatory Visit: Payer: Self-pay

## 2018-03-20 VITALS — BP 107/79 | HR 65 | Temp 97.7°F | Resp 16 | Ht 67.0 in | Wt 205.8 lb

## 2018-03-20 DIAGNOSIS — E119 Type 2 diabetes mellitus without complications: Secondary | ICD-10-CM | POA: Diagnosis not present

## 2018-03-20 DIAGNOSIS — D1721 Benign lipomatous neoplasm of skin and subcutaneous tissue of right arm: Secondary | ICD-10-CM

## 2018-03-20 DIAGNOSIS — G629 Polyneuropathy, unspecified: Secondary | ICD-10-CM

## 2018-03-20 DIAGNOSIS — M2550 Pain in unspecified joint: Secondary | ICD-10-CM

## 2018-03-20 LAB — SEDIMENTATION RATE: Sed Rate: 5 mm/hr (ref 0–20)

## 2018-03-20 LAB — C-REACTIVE PROTEIN: CRP: 0.1 mg/dL — ABNORMAL LOW (ref 0.5–20.0)

## 2018-03-20 MED ORDER — GABAPENTIN 100 MG PO CAPS: 100.0000 mg | ORAL_CAPSULE | Freq: Every day | ORAL | 0 refills | Status: DC

## 2018-03-20 NOTE — Patient Outreach (Signed)
[place] [facility]) Care Management  03/20/2018  Andrew [name] 07/22/1966 [id number]   Successful outreach to patient in efforts to follow up on social work referral for assistance with community resources. HIPAA identifiers confirmed. Patient states he is in need of bus vouchers and passes as well as housing options. Patient desires to stay in [facility] and "out of [place]". Patient confirms he is currently staying at a homeless shelter in [facility]. Patient confirms he has coverage by both Medicare and Medicaid.  BSW spoke with patient about transportation option to medical appointments covered by his Medicaid benefit. Patient unaware of this benefit. BSW educated patient on how to utilize Medicaid transportation. BSW also educated patient on [place] half price fare program for [employer]. Patient does not currently have his Medicare card but states "It is in the mail". BSW encouraged patient to carry his card with him once it arrives. BSW educated patient that both the [employer] bus and [place] bus will adjust rates to half price per trip with a current Medicare card. Patient voiced understanding.  BSW spoke with patient about utilizing the [employer] for resources with housing. Patient states he has already been in contact with this agency and they need his birth certificate. Patient asked this BSW how he could obtain a birth certificate. BSW encouraged patient to visit the register of deeds office. Patient stated he is from [place] and unsure what to do. BSW visited the register of deeds website for [place] and notified patient he could order a certified copy online and have it mailed to his location. Patients response was "yeah but that's money".   Patients source of income is through disability. BSW inquired what patients monthly income rate was. Patient became loud stating "why you asking me that? I know I'm able to pay for stuff!". BSW  responded by explaining that it helps to know monthly income in order to know what programs the patient may qualify for or to assist with finding housing the patient can afford. Patient responded by stating "I make seven something". BSW let patient know there are low income apartments and some rates are based on income as well as other eligibility requirements. Patient asked if it would matter if he was a felon. BSW explained that each company may have their own separate criteria.  Patient requested this BSW text him contact information. BSW apologized to patient and explained that this BSW would not be able to text information to him. Patient asked if a fax would be appropriate. BSW confirmed ability to fax resources and inquired if patient had access to a fax machine. Patient responded that the shelter has a fax but he is unsure of the number. Patient is currently waiting on a bus to pick him up from an appointment. Patient stated he would call this BSW once returning to the shelter to provide a fax number.  Once BSW obtains the appropriate fax number this BSW will fax patient the following: [employer], a list of affordable housing obtained from [place], a list of income based apartments located in [place], and contact information for [employer].  Daneen Schick, Arita Miss, CDP Social Worker [phone]

## 2018-03-20 NOTE — Telephone Encounter (Signed)
Can you call and schedule pt a lab appointment.

## 2018-03-20 NOTE — Telephone Encounter (Signed)
Will you call pt today. I added labs for joint pains. But lab did not see future orders I placed the other day. So he needs to either come back today if he has not already eaten. Or he can get rescheduled just for lab draw on Monday.  Sorry lab did not see future orders I placed other day only saw labs I put in today for his joints.

## 2018-03-20 NOTE — Patient Instructions (Addendum)
  For your diabetes, I recommend that you get your glucometer today.  Check your blood sugars 3 times daily.  I am referring for diabetic education.  You report some GI side effects with metformin and tentatively would recommend that she use 500mg  once daily for about 2 weeks.  Then advance to Metformin 500mg  twice daily for 2 weeks.  Then increase to thousand milligrams twice daily.  Hopefully you can tolerate incremental increase dosing.  But if not please let me know.  For right shoulder probable lipoma, I referred you to surgeon.  For recent arthralgias when it was raining, I am placing arthritis panel studies.  After reviewing your kidney function studies/labs that are being drawn today will decide on whether to use over-the-counter NSAIDs or if I will write a prescription.  You do have some findings of probable diabetic neuropathy today on exam.  I can prescribe you gabapentin to use at night.  Regarding the diabetic certification/foot wear prescription, I am going to review this with supervising physician and see if we need to refer you to podiatrist.  Follow-up date to be determined after review of lab work.

## 2018-03-20 NOTE — Patient Outreach (Signed)
[facility] [facility]) Care Management  03/20/2018  Andrew [name] 29-Apr-1966 [id number]  Patient was called to follow up on testing supplies. HIPAA identifiers were obtained. He saw his PCP this morning. Testing supplies were sent to the [facility] on [address] in [place]. [facility] was [facility]. They confirmed they had the prescription but were not able to bill it to the patient's insurance. When asked if they billed the supplies to the patient's Medicare Part D, I was told no because they did not have his regular Medicare Card. Patient was asked to take his card to the pharmacy and he said he does not have a Medicare Card as of yet. He has a Quarry manager from [employer] and said he would take his letter.   [facility] was called back to see if I could fax them the letter as it has the patient's medicare number on it. Unfortunately, I was put on hold for more than 30 minutes.  Patient was instructed to give me a call if he has any issues.   Plan: Follow up with the patient in 3-5 business days.   [name], PharmD, [place] Clinical Pharmacist [phone]

## 2018-03-20 NOTE — Progress Notes (Signed)
Subjective:    Patient ID: Andrew [name], male    DOB: October 19, 1965, 52 y.o.   MRN: [id number]  HPI  Pt in for follow up.  Pt states he has some upset stomach. Associates upset stomach with metformin. If does not take med stomach feels fine. He noticed this with 1000 mg dose.  Pt has not picked up his verio glucometer. We sent that in yesterday with his supplies.  Pt is newly diagnosed diabetic. Unclear how long he has been diabetic. Recent tingling of his distal foot since I last saw him. He has form for diabetic orthotic which he wants [place] to fill.  Notes also bilateral ankle and feet swelling. Some achiness. He noticed this with recent rain. Also with rain  he noted bilateral elbows and knee pain.  Rt shoulder. Small lump. Pt thinks it may be lipoma. Had for years.     Review of Systems  Constitutional: Negative for chills, fatigue and fever.  HENT: Negative for congestion and ear discharge.   Respiratory: Negative for cough, chest tightness, shortness of breath and wheezing.   Cardiovascular: Negative for chest pain and palpitations.  Gastrointestinal: Negative for abdominal pain.       Upset stomach with metformin.  Musculoskeletal: Positive for arthralgias.  Skin:       Lipoma rt shoulder see hpi.  Neurological: Negative for dizziness, seizures, speech difficulty, weakness and headaches.       Neuropathy.  Hematological: Negative for adenopathy. Does not bruise/bleed easily.    Past Medical History:  Diagnosis Date  . Anxiety   . Arthritis    right knee  . Back pain    occasionally  . Bipolar disorder ([place])    denies  . COPD (chronic obstructive pulmonary disease) ([place])   . Depression   . Diabetes mellitus without complication ([place])   . Dizziness   . Generalized headaches    history of migraines-last one 02/02/14  . GERD (gastroesophageal reflux disease)    takes Prilosec daily as needed  . GERD (gastroesophageal reflux disease)   . Headache   . Hemorrhoids    . History of bronchitis    1 month ago and treated with Amoxicillin and given an inhaler;takes Prenisone  . Hyperlipidemia    takes Zetia daily  . Hypertension    takes HCTZ daily  . Joint pain   . Joint swelling   . Pneumonia    hx of 2 yrs ago  . Polysubstance abuse ([place])   . Schizoaffective disorder ([place])    denies  . Tuberculosis    treated for 1 year  . Umbilical hernia      Social History   Socioeconomic History  . Marital status: Single    Spouse name: Not on file  . Number of children: Not on file  . Years of education: Not on file  . Highest education level: Not on file  Occupational History  . Occupation: lifts and pulls concrete    Employer: [employer]   Social Needs  . Financial resource strain: Not on file  . Food insecurity:    Worry: Not on file    Inability: Not on file  . Transportation needs:    Medical: Not on file    Non-medical: Not on file  Tobacco Use  . Smoking status: Current Every Day Smoker    Packs/day: 0.25    Years: 30.00    Pack years: 7.50  . Smokeless tobacco: Never Used  Substance and Sexual  Activity  . Alcohol use: No    Frequency: Never    Comment: occasionally   . Drug use: Yes    Types: Marijuana, Cocaine    Comment: cocaine, night before last  . Sexual activity: Yes  Lifestyle  . Physical activity:    Days per week: Not on file    Minutes per session: Not on file  . Stress: Not on file  Relationships  . Social connections:    Talks on phone: Not on file    Gets together: Not on file    Attends religious service: Not on file    Active member of club or organization: Not on file    Attends meetings of clubs or organizations: Not on file    Relationship status: Not on file  . Intimate partner violence:    Fear of current or ex partner: Not on file    Emotionally abused: Not on file    Physically abused: Not on file    Forced sexual activity: Not on file  Other Topics Concern  . Not on file  Social History  Narrative   ** Merged History Encounter **       Lives with girlfriend. Work involves a lot of pulling and lifting concrete, was not definite about type of work. Denies use of illegal drugs. Smokes half pack of cigarettes daily.    Past Surgical History:  Procedure Laterality Date  . ESOPHAGOGASTRODUODENOSCOPY    . JOINT REPLACEMENT    . KNEE ARTHROSCOPY WITH MEDIAL MENISECTOMY Right 03/01/2014   Procedure: KNEE ARTHROSCOPY WITH MEDIAL MENISECTOMY;  Surgeon: Meredith Pel, MD;  Location: [facility];  Service: Orthopedics;  Laterality: Right;  . LIPOMA EXCISION  ~ 2010   "back left side of my head"  . PARTIAL KNEE ARTHROPLASTY Right 01/24/2015   Procedure: RIGHT KNEE MEDIAL COMPARTMENT REPLACEMENT VS TOTAL KNEE ARTHROPLASTY.;  Surgeon: Meredith Pel, MD;  Location: [facility];  Service: Orthopedics;  Laterality: Right;  RIGHT KNEE MEDIAL COMPARTMENT REPLACEMENT VS TOTAL KNEE ARTHROPLASTY.  . TOTAL KNEE ARTHROPLASTY Right     Family History  Problem Relation Age of Onset  . Hypertension Mother   . Cancer Mother   . Heart disease Father   . Heart attack Father 81  . Hyperlipidemia Father   . Hypertension Father   . Diabetes Sister   . Diabetes Brother     Allergies  Allergen Reactions  . Mushroom Extract Complex Hives  . Penicillins Hives  . Penicillins Hives    Has patient had a PCN reaction causing immediate rash, facial/tongue/throat swelling, SOB or lightheadedness with hypotension: no Has patient had a PCN reaction causing severe rash involving mucus membranes or skin necrosis: no Has patient had a PCN reaction that required hospitalization: no Has patient had a PCN reaction occurring within the last 10 years: yes If all of the above answers are "NO", then may proceed with Cephalosporin use.    Current Outpatient Medications on File Prior to Visit  Medication Sig Dispense Refill  . metFORMIN (GLUCOPHAGE) 1000 MG tablet Take 1 tablet (1,000 mg total) by mouth 2 (two) times  daily with a meal. 180 tablet 3  . OLANZapine (ZYPREXA) 5 MG tablet Take 5 mg by mouth at bedtime.    . sertraline (ZOLOFT) 50 MG tablet Take 50 mg by mouth daily.    . traZODone (DESYREL) 50 MG tablet Take 50 mg by mouth at bedtime.     No current facility-administered medications on file prior  to visit.     BP 107/79   Pulse 65   Temp 97.7 F (36.5 C) (Oral)   Resp 16   Ht 5\' 7"  (1.702 m)   Wt 205 lb 12.8 oz (93.4 kg)   SpO2 100%   BMI 32.23 kg/m       Objective:   Physical Exam   General Mental Status- Alert. General Appearance- Not in acute distress.   Skin General: Color- Normal Color. Moisture- Normal Moisture.  Rt shoudler on palpation and inspection. Moderate sized problable lipoma.   Neck Carotid Arteries- Normal color. Moisture- Normal Moisture. No carotid bruits. No JVD.  Chest and Lung Exam Auscultation: Breath Sounds:-Normal.  Cardiovascular Auscultation:Rythm- Regular. Murmurs & Other Heart Sounds:Auscultation of the heart reveals- No Murmurs.  Abdomen Inspection:-Inspeection Normal. Palpation/Percussion:Note:No mass. Palpation and Percussion of the abdomen reveal- Non Tender, Non Distended + BS, no rebound or guarding.    Neurologic Cranial Nerve exam:- CN III-XII intact(No nystagmus), symmetric smile. Strength:- 5/5 equal and symmetric strength both upper and lower extremities. Mild distal foot neuropathy.  Skin- moderate callous rt heel.  Feet- see quality metrics.      Assessment & Plan:  For your diabetes, I recommend that you get your glucometer today.  Check your blood sugars 3 times daily.  I am referring for diabetic education.  You report some GI side effects with metformin and tentatively would recommend that she use 500mg  once daily for about 2 weeks.  Then advance to Metformin 500mg  twice daily for 2 weeks.  Then increase to thousand milligrams twice daily.  Hopefully you can tolerate incremental increase dosing.  But if not  please let me know.  For right shoulder probable lipoma, I referred you to surgeon.  For recent arthralgias when it was raining, I am placing arthritis panel studies.  After reviewing your kidney function studies/labs that are being drawn today will decide on whether to use over-the-counter NSAIDs or if I will write a prescription.  You do have some findings of probable diabetic neuropathy today on exam.  I can prescribe you gabapentin to use at night.  Regarding the diabetic certification/foot wear prescription, I am going to review this with supervising physician and see if we need to refer you to podiatrist.  Follow-up date to be determined after review of lab work.  40 minutes spent with pt. 50% of time explaining management plan for various conditions diabetes, lipoma, arthralgia, and neuropathy.  Talked with lab later and they did not see his future orders. So will ask Jasmine to call pt and get him schedule for fasting lab early next week or come back today if he is fasting.  [name], PA-C

## 2018-03-23 ENCOUNTER — Other Ambulatory Visit: Payer: Self-pay | Admitting: *Deleted

## 2018-03-23 ENCOUNTER — Telehealth: Payer: Self-pay | Admitting: Medical

## 2018-03-23 ENCOUNTER — Other Ambulatory Visit: Payer: Self-pay

## 2018-03-23 ENCOUNTER — Telehealth: Payer: Self-pay

## 2018-03-23 DIAGNOSIS — D1721 Benign lipomatous neoplasm of skin and subcutaneous tissue of right arm: Secondary | ICD-10-CM | POA: Diagnosis not present

## 2018-03-23 NOTE — Telephone Encounter (Signed)
Copied from [place] [id number]. Topic: Quick Communication - Lab Results >> Mar 23, 2018  9:26 AM Lennox Solders wrote: Pt is calling and needs blood work results

## 2018-03-23 NOTE — Telephone Encounter (Signed)
Copied from [place] [id number]. Topic: General - Other >> Mar 23, 2018  9:27 AM Lennox Solders wrote: Reason for CRM:pt is calling and needs work note from 6-14 and return to work on 03-24-18 . Attn will regarding Zigmund Lievanos fax number [phone]

## 2018-03-23 NOTE — Patient Outreach (Signed)
[facility]) Care Management  03/23/2018  Andrew [name] Sep 16, 1966 [id number]  BSW received an incoming call from patient to provide a fax number for this BSW to send community resources to the patient. Patient is currently living in [place] at a homeless shelter and is unable to receive mail at that location. BSW faxed a [employer], a list of low income apartments, and information on how to utilize Medicaid transportation with the appropriate number for the patient to call when he need transportation to medical appointment. A successful fax transmission was obtained once fax was sent.  Daneen Schick, Arita Miss, CDP Social Worker [phone]

## 2018-03-23 NOTE — Telephone Encounter (Signed)
If you write the work note then I will sign it. Or you can stamp and then fax.

## 2018-03-23 NOTE — Telephone Encounter (Addendum)
Copied from [place] [id number]. Topic: General - Other >> Mar 23, 2018  9:22 AM Lennox Solders wrote: Reason for CRM: pt is calling and per pt pharm needs edward to fill out CMN form for his gabapentin, one touch veriometer and testing strips and lancet. [facility] Higher education careers adviser and [place]

## 2018-03-23 NOTE — Patient Outreach (Signed)
[facility]) Care Management  03/23/2018  Andrew [name] Oct 12, 1965 [id number]    RN spoke with pt and inquired on his follow up appointment last Friday with his provider. Pt indicates he is current at the doctor's office now and request a call back later. Will follow up accordingly.  Raina Mina, RN Care Management Coordinator [place] Office [phone]

## 2018-03-23 NOTE — Telephone Encounter (Signed)
Called patient and patient made appointment for Wedneaday 03-24-18 @ 8am.

## 2018-03-24 ENCOUNTER — Other Ambulatory Visit: Payer: Self-pay | Admitting: *Deleted

## 2018-03-24 ENCOUNTER — Encounter: Payer: Self-pay | Admitting: Medical

## 2018-03-24 ENCOUNTER — Ambulatory Visit (INDEPENDENT_AMBULATORY_CARE_PROVIDER_SITE_OTHER): Payer: Medicare Other | Admitting: Family Medicine

## 2018-03-24 ENCOUNTER — Other Ambulatory Visit (INDEPENDENT_AMBULATORY_CARE_PROVIDER_SITE_OTHER): Payer: Medicare Other

## 2018-03-24 DIAGNOSIS — E1169 Type 2 diabetes mellitus with other specified complication: Secondary | ICD-10-CM

## 2018-03-24 DIAGNOSIS — E785 Hyperlipidemia, unspecified: Secondary | ICD-10-CM

## 2018-03-24 DIAGNOSIS — E162 Hypoglycemia, unspecified: Secondary | ICD-10-CM | POA: Diagnosis not present

## 2018-03-24 DIAGNOSIS — E119 Type 2 diabetes mellitus without complications: Secondary | ICD-10-CM | POA: Diagnosis not present

## 2018-03-24 DIAGNOSIS — R5383 Other fatigue: Secondary | ICD-10-CM

## 2018-03-24 LAB — RHEUMATOID FACTOR

## 2018-03-24 LAB — ANA: ANA: NEGATIVE

## 2018-03-24 MED ORDER — METFORMIN HCL 500 MG PO TABS: ORAL_TABLET | ORAL | 0 refills | Status: DC

## 2018-03-24 MED ORDER — GLUCOSE BLOOD VI STRP: ORAL_STRIP | 5 refills | Status: DC

## 2018-03-24 MED ORDER — ONETOUCH DELICA LANCETS 33G MISC: 3 refills | Status: DC

## 2018-03-24 NOTE — Progress Notes (Addendum)
Pt here for nurse visit for diabetic / glucometer teaching.   Per chart, pt was referred for formal diabetic education with nutrition but pt states he can only go to [facility] where [facility] will transport him. He declines based on transportation.   I gave pt a handout for "The Healthy Plate and Healthy Portion Sizes" as well as nutrition guide for fast food restaurants.   Pt was given OneTouch Verio glucometer and was advised that we will send prescriptions for lancets and test strips. Rxs sent per previous order for testing three times daily. Pt will test prior to each meal. Glucometer was demonstrated and patient demonstrated back by checking glucose in the office. Pt demonstrated proper technique / procedure and was given some alcohol pads to get started with his glucometer as well.    Pt would like to know what his goal blood sugars should be before and after each meal and will defer to PCP for these ranges.  Pt mentions that he is not able to tolerate the 1000mg  dose of metformin and is currently taking 500mg  once a day. I printed AVS from 03/20/18 visit that explained titration schedule to work his way up to the 1000mg  dose. Pt thinks he has enough of the 500mg  to last 2 weeks at once a day dose and will need rx for 2 week supply at twice a day dose. Rx sent. Pt also wanted verification that his pharmacy had received the gabapentin prescription sent on Friday. Rx was sent to [facility] [address] and they verified receipt of Rx. He would like to use [facility] [address] and he will have Rx transferred. Advised pt I will remove all pharmacies from contact list except Main / Animal nutritionist and he is agreeable.   Pt requests note for employer verifying that he had an appointment today. Note given to pt.  Noted routed to Dr Charlett Blake in PCP's absence and to PCP for additional recommendations regarding glucose ranges.  I actually reviewed note and co sign. Was sent to me. Not sure if sent to  Dr. Si Gaul as well.  Nursing note reviewed. Agree with documention and plan.  [name], PA-C

## 2018-03-24 NOTE — Patient Outreach (Signed)
[place] [facility]) Care Management  03/24/2018  EWAN GRAU 19-Jun-1966 [id number]  Telephone Assessment  RN spoke with pt today and verified needs. RN inquired on further assisting this pt with a glucometer however pt states initial he was given a meter that did not work and has an appointment on Wednesday that he has rescheduled for this afternoon. Pt states he will try to obtain a meter from his provider's office. RN strongly encouraged pt to seek instructions on the meter while in the provider's office on how to use the meter. Pt has used a meter in the pass. RN has contacted pt on three different occassions however unable to arrange appointments for possible provider's office visit with this pt due to conflict in schedule. Pt also works throughout the work and continue to live at [facility] in [place]. RN will not be visiting the shelter but offered to see the pt on a provider's visit if necessary. Pt receptive however unable to arrange the visit today at today at the last minute. RN offer to contact pt tomorrow morning to inquired again on if he received a meter for glucose monitoring. Pt agrees with the plan and RN will further engage if needed on tomorrow conversation with this pt.   Raina Mina, RN Care Management Coordinator [place] Office [phone]

## 2018-03-25 ENCOUNTER — Ambulatory Visit: Payer: Medicare Other

## 2018-03-25 ENCOUNTER — Telehealth: Payer: Self-pay | Admitting: Medical

## 2018-03-25 ENCOUNTER — Encounter: Payer: Self-pay | Admitting: *Deleted

## 2018-03-25 ENCOUNTER — Other Ambulatory Visit: Payer: Medicare Other

## 2018-03-25 ENCOUNTER — Other Ambulatory Visit: Payer: Self-pay | Admitting: *Deleted

## 2018-03-25 ENCOUNTER — Telehealth: Payer: Self-pay | Admitting: *Deleted

## 2018-03-25 ENCOUNTER — Other Ambulatory Visit: Payer: Self-pay | Admitting: Pharmacist

## 2018-03-25 LAB — CBC WITH DIFFERENTIAL/PLATELET
BASOS ABS: 0 10*3/uL (ref 0.0–0.1)
Basophils Relative: 0.3 % (ref 0.0–3.0)
EOS ABS: 0.2 10*3/uL (ref 0.0–0.7)
Eosinophils Relative: 2.4 % (ref 0.0–5.0)
HCT: 42.1 % (ref 39.0–52.0)
HEMOGLOBIN: 14.3 g/dL (ref 13.0–17.0)
Lymphocytes Relative: 33.9 % (ref 12.0–46.0)
Lymphs Abs: 2.4 10*3/uL (ref 0.7–4.0)
MCHC: 34.1 g/dL (ref 30.0–36.0)
MCV: 91.2 fl (ref 78.0–100.0)
MONO ABS: 0.6 10*3/uL (ref 0.1–1.0)
Monocytes Relative: 8.7 % (ref 3.0–12.0)
Neutro Abs: 3.8 10*3/uL (ref 1.4–7.7)
Neutrophils Relative %: 54.7 % (ref 43.0–77.0)
Platelets: 368 10*3/uL (ref 150.0–400.0)
RBC: 4.61 Mil/uL (ref 4.22–5.81)
RDW: 13.4 % (ref 11.5–15.5)
WBC: 7 10*3/uL (ref 4.0–10.5)

## 2018-03-25 LAB — VITAMIN D 25 HYDROXY (VIT D DEFICIENCY, FRACTURES): VITD: 20.74 ng/mL — AB (ref 30.00–100.00)

## 2018-03-25 LAB — VITAMIN B12: Vitamin B-12: 239 pg/mL (ref 211–911)

## 2018-03-25 LAB — LIPID PANEL
CHOL/HDL RATIO: 4
CHOLESTEROL: 150 mg/dL (ref 0–200)
HDL: 39.7 mg/dL (ref >39.00)
LDL Cholesterol: 73 mg/dL (ref 0–99)
NonHDL: 110.5
Triglycerides: 186 mg/dL — ABNORMAL HIGH (ref 0.0–149.0)
VLDL: 37.2 mg/dL (ref 0.0–40.0)

## 2018-03-25 LAB — HEMOGLOBIN A1C: Hgb A1c MFr Bld: 9.5 % — ABNORMAL HIGH (ref 4.6–6.5)

## 2018-03-25 LAB — TSH: TSH: 0.42 u[IU]/mL (ref 0.35–4.50)

## 2018-03-25 MED ORDER — VITAMIN D (ERGOCALCIFEROL) 1.25 MG (50000 UNIT) PO CAPS: 50000.0000 [IU] | ORAL_CAPSULE | ORAL | 0 refills | Status: DC

## 2018-03-25 NOTE — Telephone Encounter (Signed)
I sent you result note with information.   Regarding the biotech form let him know I discussed this with Dr. Etter Sjogren. And she stated she gets podiatrist to do detailed foot exam and fill out form.  I can leave the form up front in alphabetical folder.  If he agrees with referral I will place that. Also if agrees with podiatrist referral does he want it in [place].

## 2018-03-25 NOTE — Patient Outreach (Signed)
[place] [facility]) Care Management  03/25/2018  Andrew [name] [birth year] [id number]  Telephone Assessment (Unsuccessful)   RN communicated with team member following this pt concerning verification pt has received a glucometer for ongoing case management education on managing his diabetes. RN responded with an outreach call to the pt for ongoing case management services however unsuccessful with today's contact. RN able to leave a HIPAA approved voice message requesting a call back. Will send letter and rescheduled another follow up call for further involvement (managinghis diabetes).  Raina Mina, RN Care Management Coordinator [place] Office [phone]

## 2018-03-25 NOTE — Telephone Encounter (Signed)
Rx vitamin d sent to pharmacy.

## 2018-03-25 NOTE — Telephone Encounter (Signed)
Notified pt of below and he is agreeable to referral to Podiatrist and he needs to see one in [facility] Please place the referral.

## 2018-03-25 NOTE — Patient Outreach (Signed)
[place] [facility]) Care Management  03/25/2018  Andrew [name] 1965/10/22 [id number]   Spoke with patient regarding medication assistance with diabetic testing supplies. HIPAA identifiers were obtained. Patient confirmed he was able to get his meter and initial supply of strips.  He said he was planning on going to the pharmacy to pick up the strips from them.  Plan: Close pharmacy case as patient has his supplies and understands his medications. Spoke with [place] Nurse, Raina Mina and notified her of pharmacy case closure.   [name], PharmD, [place] Clinical Pharmacist [phone]

## 2018-03-25 NOTE — Telephone Encounter (Signed)
Pt was in office yesterday for nurse visit for diabetic education. Pt was previously instructed to check blood sugar three times daily. Pt would like to know what his readings should be before and after meals?  Pt also asking the status of the Biotech form for Orthopedic shoes that he brought in?

## 2018-03-26 ENCOUNTER — Other Ambulatory Visit: Payer: Self-pay

## 2018-03-26 ENCOUNTER — Telehealth: Payer: Self-pay | Admitting: Medical

## 2018-03-26 DIAGNOSIS — L98491 Non-pressure chronic ulcer of skin of other sites limited to breakdown of skin: Secondary | ICD-10-CM

## 2018-03-26 DIAGNOSIS — E1040 Type 1 diabetes mellitus with diabetic neuropathy, unspecified: Secondary | ICD-10-CM

## 2018-03-26 NOTE — Patient Outreach (Signed)
[place] [facility]) Care Management  03/26/2018  Andrew [name] 1966-02-15 [id number]   BSW received a voice message from the patient stating her has found an apartment and is waiting on his paperwork to go through. BSW returned call to the patient who did answer but stated he is at his part-time job and unable to talk. Patient would like this BSW to contact later to discuss possible resources that assist with power bills. BSW to attempt patient again in the next 4 days.  Daneen Schick, Arita Miss, CDP Social Worker [phone]

## 2018-03-26 NOTE — Telephone Encounter (Signed)
We are in process of referring him to podiatry. We have a form up front for pt to pick up. If is podiatry foot exam form. It is in [employer]. Have him pick up and take to his podiatrist appointment.

## 2018-03-27 ENCOUNTER — Other Ambulatory Visit: Payer: Self-pay | Admitting: *Deleted

## 2018-03-27 ENCOUNTER — Ambulatory Visit: Payer: Self-pay

## 2018-03-27 NOTE — Patient Outreach (Signed)
[place] [facility]) Care Management  03/27/2018  Andrew [name] 12/07/1965 [id number]   RN attempted outreach call to this pt however unsuccessful but able to leave a HIPAA approved voice message requesting a call back for pending services. Last conversation pt was going to his provider office to obtain his glucometer. RN encouraged the pt to request additional education on the meter while at the provider office on his exam visit. Will attempt to follow up once again next week to inquire further on pt's level of education needed to manage his diabetes. Note pt has states in the past that he is aware on how to use a meter. Will inquire further at that time.  Raina Mina, RN Care Management Coordinator [place] Office [phone]

## 2018-03-27 NOTE — Telephone Encounter (Signed)
Left pt a message to call back. 

## 2018-03-28 LAB — VITAMIN B1: Vitamin B1 (Thiamine): 17 nmol/L (ref 8–30)

## 2018-03-30 ENCOUNTER — Other Ambulatory Visit: Payer: Self-pay | Admitting: *Deleted

## 2018-03-30 ENCOUNTER — Other Ambulatory Visit: Payer: Self-pay

## 2018-03-30 DIAGNOSIS — D1721 Benign lipomatous neoplasm of skin and subcutaneous tissue of right arm: Secondary | ICD-10-CM | POA: Diagnosis not present

## 2018-03-30 NOTE — Patient Outreach (Signed)
[place] [facility]) Care Management  03/30/2018  Andrew [name] 1965-10-09 [id number]   Telephone Assessment  Pt was to obtain a glucometer from his provider's office visit as informed over a week ago. RN followed up twice last week but pt was again on an office visit with the attempt to obtain supplies for a glucometer. I followed up today and he states he does not need a meter but strips. Pt informed to contact his insurance provided concerning meters that will be covered for ongoing affordability of the supplies such as the strips in the further (pt agreed to perform this task however did not pursue).  I also encouraged pt if he was to obtain a new glucometer from his provider to please allow the staff to educate on how to perform use of the device for BS sticks and I would follow up accordingly. Again pt did not initiated this task at his provider's office. Pt is aware [facility] can not provide strips to continue glucometer monitoring (note he has used the 10 strips that initial came with the meter but can not afford ongoing strips at this time).  Pt continue to be short on the conversations with this RN on every call so we have not been able to establish a care plan or further education of his diabetes.  I attempted to offered assistance with education however pt has opted to decline RN for further case management services at this time.  Pt has ended the call with no additional inquires. Therefore I will update the involved team members of pt's decision with this RN case manager and notify his provided.  Raina Mina, RN Care Management Coordinator [place] Office [phone]

## 2018-03-30 NOTE — Patient Outreach (Signed)
[place]) Care Management  03/30/2018  MAXIMOS ZAYAS 1965/11/05 [id number]  Successful outreach to patient to discuss possible resources to assist patient with initial electricity bill. HIPAA identifiers confirmed. Patient stated he was "having surgery this morning" and requested this BSW to text resources to patients phone. BSW explained to the patient that this BSW was unable to text but if the patient could obtain a pen and paper this BSW could provide the patient with three resources.   BSW provided patient with contact numbers to [facility] low income assistance program as well as the [employer] emergency assistance program. BSW attempted to supply the contact information to the [employer] but patient stopped BSW stating "I've been there and they can't do anything until next Monday".   BSW encouraged patient to contact the provided resources and to keep this BSW updated with status. BSW will outreach the patient in the next two weeks.  Daneen Schick, BSW, CDP [facility] [phone]

## 2018-04-01 ENCOUNTER — Other Ambulatory Visit: Payer: Self-pay

## 2018-04-01 ENCOUNTER — Telehealth: Payer: Self-pay | Admitting: *Deleted

## 2018-04-01 NOTE — Telephone Encounter (Signed)
Patient called to see if we have received form from [facility] for test strips.  Advised patient that we have not received anything and we will call pharmacy to get them to resend.  Form received and signed by Dr. Lorelei Pont in [place] absence.  [facility] and patient notified.

## 2018-04-01 NOTE — Patient Outreach (Signed)
[facility] [place] [facility]) Care Management  04/01/2018  Andrew [name] 12-19-1965 [id number]  BSW received a call from patient requesting further assistance with utility bill. HIPAA identifiers confirmed. Patient states "I got the apartment but I need money for my light bill". BSW apologized to patient stating this BSW already provided three resources. BSW inquired if the shelter the patient is currently staying is able to assist with funding. Patient denied stating :they are already helping some with rent". Patient indicated the power company stated she will have to pay his outstanding balance of $300 in order to get services. Then they will be able to develop a payment plan for the patient. BSW encouraged the patient to call DSS to inquire about possible services and/or funding that the patient may qualify for. BSW provided patient with a contact number. Patient thanked this BSW and stated he would call. Patient also states "I've got the money but I have other bills too". BSW encouraged patient to reach out to his caseworker for resources as well.  Daneen Schick, BSW, CDP [facility] [phone]

## 2018-04-07 ENCOUNTER — Ambulatory Visit: Payer: Self-pay

## 2018-04-07 ENCOUNTER — Other Ambulatory Visit: Payer: Self-pay

## 2018-04-07 ENCOUNTER — Ambulatory Visit: Payer: Medicare Other | Admitting: Sports Medicine

## 2018-04-07 NOTE — Patient Outreach (Signed)
[place] [facility]) Care Management  04/07/2018  Andrew [name] [birth year] [id number]   BSW attempted to contact patient to check on the status of housing and utility assistance. Unfortunately the patient did not answer. BSW left a  HIPAA compliant voice message requesting a return call.  Plan: BSW did not send patient an unsuccessful outreach letter due to the patient currently residing in a homeless shelter. BSW will attempt to contact patient in the next four days.  Daneen Schick, BSW, CDP [facility] [phone]

## 2018-04-14 ENCOUNTER — Other Ambulatory Visit: Payer: Self-pay

## 2018-04-14 ENCOUNTER — Ambulatory Visit: Payer: Self-pay

## 2018-04-14 NOTE — Patient Outreach (Signed)
[place] [facility]) Care Management  04/14/2018  Andrew [name] 1966-06-27 [id number]  BSW to perform a case closure on today's date due to an inability to maintain contact with the patient. It is noted that the patient declined further [facility] RNCM engagement on June 24th. Since that declination, this BSW has been unable to establish contact. This BSW will perform a case closure as it seems the patient is no longer interested in engaging in [place] Management services. The patient is aware of this BSW's contact information if he chooses to initiate services at a later time.  This BSW to send a case closure letter to that patients primary physician. Unfortunately, as noted in previous conversations, the patient has been residing at a homeless shelter in [facility] and is unable to receive mail. The patient has not provided this BSW with an alternate address therefore a case closure letter is unable to be sent to the patient.  Daneen Schick, BSW, CDP [facility] [phone]

## 2018-04-24 ENCOUNTER — Ambulatory Visit: Payer: Medicare Other | Admitting: Medical

## 2018-05-04 ENCOUNTER — Ambulatory Visit: Payer: Medicare Other | Admitting: Registered"

## 2018-05-05 ENCOUNTER — Ambulatory Visit (INDEPENDENT_AMBULATORY_CARE_PROVIDER_SITE_OTHER): Payer: Medicare Other | Admitting: Sports Medicine

## 2018-05-05 ENCOUNTER — Encounter: Payer: Self-pay | Admitting: Sports Medicine

## 2018-05-05 DIAGNOSIS — M79674 Pain in right toe(s): Secondary | ICD-10-CM | POA: Diagnosis not present

## 2018-05-05 DIAGNOSIS — M79675 Pain in left toe(s): Secondary | ICD-10-CM

## 2018-05-05 DIAGNOSIS — B351 Tinea unguium: Secondary | ICD-10-CM | POA: Diagnosis not present

## 2018-05-05 DIAGNOSIS — E1142 Type 2 diabetes mellitus with diabetic polyneuropathy: Secondary | ICD-10-CM | POA: Diagnosis not present

## 2018-05-05 MED ORDER — GABAPENTIN 100 MG PO CAPS: 300.0000 mg | ORAL_CAPSULE | Freq: Every day | ORAL | 0 refills | Status: DC

## 2018-05-05 NOTE — Progress Notes (Signed)
Subjective: Andrew [name] is a 52 y.o. male patient seen today in office with complaint of burning pain to both feet x 5 weeks. Saw PCP who Rx meds but he hasn't gotten them. Patient also has painful thickened and elongated toenails; unable to trim. Patient denies any changes to medical history. Patient has no other pedal complaints at this time.   Review of Systems  Neurological: Positive for tingling and sensory change.  All other systems reviewed and are negative.  + history of radiculopathy and works in [employer]  Patient Active Problem List   Diagnosis Date Noted  . Crack cocaine overdose ([place]) 09/28/2017  . Hypoglycemia 09/28/2017  . Hepatitis 09/28/2017  . Leucocytosis 09/28/2017  . Overdose 09/28/2017  . Arthritis of knee, degenerative 01/24/2015  . Right knee pain 01/07/2014  . Concern about STD in male without diagnosis 01/06/2014  . Dysuria 12/09/2013  . High cholesterol 10/25/2013  . GERD (gastroesophageal reflux disease) 09/20/2013  . History of positive PPD 07/17/2012  . Cocaine abuse ([place]) 04/02/2012  . Rhabdomyolysis 04/02/2012  . Hernia of abdominal wall 01/05/2011  . ERECTILE DYSFUNCTION, PSYCHOGENIC 02/13/2010  . BIPOLAR DISORDER UNSPECIFIED 01/31/2009  . TOBACCO ABUSE 01/31/2009  . ELEVATED BP READING WITHOUT DX HYPERTENSION 01/31/2009   Current Outpatient Medications on File Prior to Visit  Medication Sig Dispense Refill  . glucose blood (ONETOUCH VERIO) test strip Use as instructed to check blood sugar three times daily. DX E11.9 100 each 5  . metFORMIN (GLUCOPHAGE) 1000 MG tablet Take 1 tablet (1,000 mg total) by mouth 2 (two) times daily with a meal. 180 tablet 3  . metFORMIN (GLUCOPHAGE) 500 MG tablet Take 548m twice a day for 2 weeks then start new prescription for 10055m DX E11.9 28 tablet 0  . OLANZapine (ZYPREXA) 5 MG tablet Take 5 mg by mouth at bedtime.    . Glory RosebushELICA LANCETS 3388FISC USE TO CHECK BLOOD SUGAR 3 TIMES DAILY. DX  E11.9 100 each 3  . sertraline (ZOLOFT) 50 MG tablet Take 50 mg by mouth daily.    . traZODone (DESYREL) 50 MG tablet Take 50 mg by mouth at bedtime.    . Vitamin D, Ergocalciferol, (DRISDOL) 50000 units CAPS capsule Take 1 capsule (50,000 Units total) by mouth every 7 (seven) days. 4 capsule 0   No current facility-administered medications on file prior to visit.    Allergies  Allergen Reactions  . Mushroom Extract Complex Hives  . Penicillins Hives  . Penicillins Hives    Has patient had a PCN reaction causing immediate rash, facial/tongue/throat swelling, SOB or lightheadedness with hypotension: no Has patient had a PCN reaction causing severe rash involving mucus membranes or skin necrosis: no Has patient had a PCN reaction that required hospitalization: no Has patient had a PCN reaction occurring within the last 10 years: yes If all of the above answers are "NO", then may proceed with Cephalosporin use.    Recent Results (from the past 2160 hour(s))  GC[facility]Chlamydia probe amp [facility]   Status: None   Collection Time: 03/06/18 12:00 AM  Result Value Ref Range   Chlamydia Negative     Comment: Normal Reference Range - Negative   Neisseria gonorrhea Negative     Comment: Normal Reference Range - Negative  Urinalysis, Routine w reflex microscopic     Status: Abnormal   Collection Time: 03/06/18  6:37 PM  Result Value Ref Range   Color, Urine YELLOW YELLOW  APPearance CLEAR CLEAR   Specific Gravity, Urine 1.015 1.005 - 1.030   pH 6.0 5.0 - 8.0   Glucose, UA >=500 (A) NEGATIVE mg/dL   Hgb urine dipstick NEGATIVE NEGATIVE   Bilirubin Urine NEGATIVE NEGATIVE   Ketones, ur NEGATIVE NEGATIVE mg/dL   Protein, ur NEGATIVE NEGATIVE mg/dL   Nitrite NEGATIVE NEGATIVE   Leukocytes, UA NEGATIVE NEGATIVE    Comment: Performed at [facility], [address] [place], [place] [address]  Urinalysis, Microscopic (reflex)     Status: Abnormal   Collection Time:  03/06/18  6:37 PM  Result Value Ref Range   RBC / HPF NONE SEEN 0 - 5 RBC/hpf   WBC, UA 0-5 0 - 5 WBC/hpf   Bacteria, UA RARE (A) NONE SEEN   Squamous Epithelial / LPF 0-5 0 - 5    Comment: Performed at [facility], [address] [place], [place] [address]  CBC with Differential     Status: None   Collection Time: 03/06/18  7:01 PM  Result Value Ref Range   WBC 7.6 4.0 - 10.5 K/uL   RBC 4.47 4.22 - 5.81 MIL/uL   Hemoglobin 13.9 13.0 - 17.0 g/dL   HCT 39.6 39.0 - 52.0 %   MCV 88.6 78.0 - 100.0 fL   MCH 31.1 26.0 - 34.0 pg   MCHC 35.1 30.0 - 36.0 g/dL   RDW 12.4 11.5 - 15.5 %   Platelets 245 150 - 400 K/uL   Neutrophils Relative % 61 %   Neutro Abs 4.6 1.7 - 7.7 K/uL   Lymphocytes Relative 26 %   Lymphs Abs 2.0 0.7 - 4.0 K/uL   Monocytes Relative 10 %   Monocytes Absolute 0.8 0.1 - 1.0 K/uL   Eosinophils Relative 3 %   Eosinophils Absolute 0.2 0.0 - 0.7 K/uL   Basophils Relative 0 %   Basophils Absolute 0.0 0.0 - 0.1 K/uL    Comment: Performed at [facility], [address] [place], [place] [address]  Comprehensive metabolic panel     Status: Abnormal   Collection Time: 03/06/18  7:01 PM  Result Value Ref Range   Sodium 126 (L) 135 - 145 mmol/L   Potassium 4.3 3.5 - 5.1 mmol/L   Chloride 94 (L) 101 - 111 mmol/L   CO2 21 (L) 22 - 32 mmol/L   Glucose, Bld 606 (HH) 65 - 99 mg/dL    Comment: CRITICAL RESULT CALLED TO, READ BACK BY AND VERIFIED WITH: ADKINS CRYSTAL RN AT 2029 ON 03/06/18 BY I.SUGUT RESULTS CONFIRMED BY MANUAL DILUTION    BUN 17 6 - 20 mg/dL   Creatinine, Ser 1.29 (H) 0.61 - 1.24 mg/dL   Calcium 8.8 (L) 8.9 - 10.3 mg/dL   Total Protein 7.3 6.5 - 8.1 g/dL   Albumin 3.8 3.5 - 5.0 g/dL   AST 23 15 - 41 U/L   ALT 19 17 - 63 U/L   Alkaline Phosphatase 165 (H) 38 - 126 U/L   Total Bilirubin 0.5 0.3 - 1.2 mg/dL   GFR calc non Af Amer >60 >60 mL/min   GFR calc Af Amer >60 >60 mL/min    Comment: (NOTE) The eGFR has been calculated using  the CKD EPI equation. This calculation has not been validated in all clinical situations. eGFR's persistently <60 mL/min signify possible Chronic Kidney Disease.    Anion gap 11 5 - 15    Comment: Performed at [facility]  [address], [address] [place], [place] [address]  RPR     Status: None   Collection Time: 03/06/18  7:01 PM  Result Value Ref Range   RPR Ser Ql Non Reactive Non Reactive    Comment: (NOTE) Performed At: [facility] [address] [place], [place] [id number] Rush Farmer MD [id number] Performed at [facility], [address] [place], [place] [address]   HIV antibody     Status: None   Collection Time: 03/06/18  7:01 PM  Result Value Ref Range   HIV Screen 4th Generation wRfx Non Reactive Non Reactive    Comment: (NOTE) Performed At: [facility] [place], [place] [id number] Rush Farmer MD [id number] Performed at [facility], [address] [place], [place] [address]   POC CBG, ED     Status: Abnormal   Collection Time: 03/06/18  7:19 PM  Result Value Ref Range   Glucose-Capillary 581 (HH) 65 - 99 mg/dL   Comment 1 Notify RN   CBG monitoring, ED     Status: Abnormal   Collection Time: 03/06/18  9:01 PM  Result Value Ref Range   Glucose-Capillary 494 (H) 65 - 99 mg/dL   Comment 1 Notify RN    Comment 2 Document in Chart   POC CBG, ED     Status: Abnormal   Collection Time: 03/06/18  9:59 PM  Result Value Ref Range   Glucose-Capillary 400 (H) 65 - 99 mg/dL   Comment 1 Notify RN    Comment 2 Document in Chart   POC CBG, ED     Status: Abnormal   Collection Time: 03/07/18 12:16 PM  Result Value Ref Range   Glucose-Capillary 268 (H) 65 - 99 mg/dL  CBC     Status: None   Collection Time: 03/07/18 12:37 PM  Result Value Ref Range   WBC 7.1 4.0 - 10.5 K/uL   RBC 4.64 4.22 - 5.81 MIL/uL   Hemoglobin 14.3 13.0 - 17.0 g/dL   HCT 40.5 39.0 - 52.0 %   MCV 87.3 78.0 - 100.0 fL   MCH  30.8 26.0 - 34.0 pg   MCHC 35.3 30.0 - 36.0 g/dL   RDW 12.4 11.5 - 15.5 %   Platelets 264 150 - 400 K/uL    Comment: Performed at [facility], [address] [place], [place] [address]  Basic metabolic panel     Status: Abnormal   Collection Time: 03/07/18 12:37 PM  Result Value Ref Range   Sodium 131 (L) 135 - 145 mmol/L   Potassium 4.2 3.5 - 5.1 mmol/L   Chloride 100 (L) 101 - 111 mmol/L   CO2 21 (L) 22 - 32 mmol/L   Glucose, Bld 276 (H) 65 - 99 mg/dL   BUN 16 6 - 20 mg/dL   Creatinine, Ser 0.72 0.61 - 1.24 mg/dL   Calcium 9.0 8.9 - 10.3 mg/dL   GFR calc non Af Amer >60 >60 mL/min   GFR calc Af Amer >60 >60 mL/min    Comment: (NOTE) The eGFR has been calculated using the CKD EPI equation. This calculation has not been validated in all clinical situations. eGFR's persistently <60 mL/min signify possible Chronic Kidney Disease.    Anion gap 10 5 - 15    Comment: Performed at [facility], [address] [place], [place] [address]  Troponin I     Status: None  Collection Time: 03/07/18 12:37 PM  Result Value Ref Range   Troponin I <0.03 <0.03 ng/mL    Comment: Performed at [facility], [address] [place], [place] [address]  Rapid urine drug screen (hospital performed)     Status: None   Collection Time: 03/07/18  1:31 PM  Result Value Ref Range   Opiates NONE DETECTED NONE DETECTED   Cocaine NONE DETECTED NONE DETECTED   Benzodiazepines NONE DETECTED NONE DETECTED   Amphetamines NONE DETECTED NONE DETECTED   Tetrahydrocannabinol NONE DETECTED NONE DETECTED   Barbiturates NONE DETECTED NONE DETECTED    Comment: (NOTE) DRUG SCREEN FOR MEDICAL PURPOSES ONLY.  IF CONFIRMATION IS NEEDED FOR ANY PURPOSE, NOTIFY LAB WITHIN 5 DAYS. LOWEST DETECTABLE LIMITS FOR URINE DRUG SCREEN Drug Class                     Cutoff (ng/mL) Amphetamine and metabolites    1000 Barbiturate and metabolites    200 Benzodiazepine                  026 Tricyclics and metabolites     300 Opiates and metabolites        300 Cocaine and metabolites        300 THC                            50 Performed at [facility], [address] [place], [place] [address]   CBG monitoring, ED     Status: Abnormal   Collection Time: 03/07/18  2:44 PM  Result Value Ref Range   Glucose-Capillary 220 (H) 65 - 99 mg/dL  Troponin I     Status: None   Collection Time: 03/07/18  3:25 PM  Result Value Ref Range   Troponin I <0.03 <0.03 ng/mL    Comment: Performed at [facility], [address] [place], [place] [address]  ANA     Status: None   Collection Time: 03/20/18  9:42 AM  Result Value Ref Range   Anti Nuclear Antibody(ANA) NEGATIVE NEGATIVE    Comment: ANA IFA is a first line screen for detecting the presence of up to approximately 150 autoantibodies in various autoimmune diseases. A negative ANA IFA result suggests ANA-associated autoimmune diseases are not present at this time. . Visit Physician FAQs for interpretation of all antibodies in the [place], prevalence, and association with diseases at http://education.[facility].com/ [email or web address]/[email or web address] .   Rheumatoid factor     Status: None   Collection Time: 03/20/18  9:42 AM  Result Value Ref Range   Rhuematoid fact SerPl-aCnc <14 <14 IU/mL  Sedimentation rate     Status: None   Collection Time: 03/20/18  9:42 AM  Result Value Ref Range   Sed Rate 5 0 - 20 mm/hr  C-reactive protein     Status: Abnormal   Collection Time: 03/20/18  9:42 AM  Result Value Ref Range   CRP 0.1 (L) 0.5 - 20.0 mg/dL  CBC w/Diff     Status: None   Collection Time: 03/24/18  2:03 PM  Result Value Ref Range   WBC 7.0 4.0 - 10.5 K/uL   RBC 4.61 4.22 - 5.81 Mil/uL   Hemoglobin 14.3 13.0 - 17.0 g/dL   HCT 42.1 39.0 - 52.0 %   MCV 91.2 78.0 - 100.0 fl   MCHC 34.1 30.0 - 36.0 g/dL   RDW 13.4  11.5 - 15.5 %   Platelets 368.0 150.0 - 400.0 K/uL   Neutrophils Relative % 54.7 43.0 - 77.0  %   Lymphocytes Relative 33.9 12.0 - 46.0 %   Monocytes Relative 8.7 3.0 - 12.0 %   Eosinophils Relative 2.4 0.0 - 5.0 %   Basophils Relative 0.3 0.0 - 3.0 %   Neutro Abs 3.8 1.4 - 7.7 K/uL   Lymphs Abs 2.4 0.7 - 4.0 K/uL   Monocytes Absolute 0.6 0.1 - 1.0 K/uL   Eosinophils Absolute 0.2 0.0 - 0.7 K/uL   Basophils Absolute 0.0 0.0 - 0.1 K/uL  TSH     Status: None   Collection Time: 03/24/18  2:03 PM  Result Value Ref Range   TSH 0.42 0.35 - 4.50 uIU/mL  Vitamin D (25 hydroxy)     Status: Abnormal   Collection Time: 03/24/18  2:03 PM  Result Value Ref Range   VITD 20.74 (L) 30.00 - 100.00 ng/mL  Vitamin B1     Status: None   Collection Time: 03/24/18  2:03 PM  Result Value Ref Range   Vitamin B1 (Thiamine) 17 8 - 30 nmol/L    Comment: Marland Kitchen Vitamin supplementation within 24 hours prior to blood draw may affect the accuracy of the results. . This test was developed and its analytical performance characteristics have been determined by [place], [place]. It has not been cleared or approved by the U.S. Food and Drug Administration. This assay has been validated pursuant to the CLIA regulations and is used for clinical purposes. .   B12     Status: None   Collection Time: 03/24/18  2:03 PM  Result Value Ref Range   Vitamin B-12 239 211 - 911 pg/mL  Lipid panel     Status: Abnormal   Collection Time: 03/24/18  2:03 PM  Result Value Ref Range   Cholesterol 150 0 - 200 mg/dL    Comment: ATP III Classification       Desirable:  < 200 mg/dL               Borderline High:  200 - 239 mg/dL          High:  > = 240 mg/dL   Triglycerides 186.0 (H) 0.0 - 149.0 mg/dL    Comment: Normal:  <150 mg/dLBorderline High:  150 - 199 mg/dL   HDL 39.70 >39.00 mg/dL   VLDL 37.2 0.0 - 40.0 mg/dL   LDL Cholesterol 73 0 - 99 mg/dL   Total CHOL/HDL Ratio 4     Comment:                Men          Women1/2 Average Risk     3.4          3.3Average Risk          5.0           4.42X Average Risk          9.6          7.13X Average Risk          15.0          11.0                       NonHDL 110.50     Comment: NOTE:  Non-HDL goal should be 30 mg/dL higher than patient's LDL goal (i.e. LDL goal of < 70 mg/dL,  would have non-HDL goal of < 100 mg/dL)  Hemoglobin A1c     Status: Abnormal   Collection Time: 03/24/18  2:03 PM  Result Value Ref Range   Hgb A1c MFr Bld 9.5 (H) 4.6 - 6.5 %    Comment: Glycemic Control Guidelines for People with Diabetes:Non Diabetic:  <6%Goal of Therapy: <7%Additional Action Suggested:  >8%     Objective: General: Patient is awake, alert, and oriented x 3 and in no acute distress.  Integument: Skin is warm, dry and supple bilateral. Nails are tender, long, thickened and dystrophic with subungual debris, consistent with onychomycosis, 1-5 bilateral. No signs of infection. No open lesions or preulcerative lesions present bilateral. Remaining integument unremarkable.  Vasculature:  Dorsalis Pedis pulse 2/4 bilateral. Posterior Tibial pulse  1/4 bilateral. Capillary fill time <3 sec 1-5 bilateral. Positive hair growth to the level of the digits.Temperature gradient within normal limits. No varicosities present bilateral. No edema present bilateral.   Neurology: The patient has absent sensation measured with a 5.07/10g Semmes Weinstein Monofilament at all pedal sites bilateral . Vibratory sensation severely diminished bilateral with tuning fork. No Babinski sign present bilateral.   Musculoskeletal: Asymptomatic pedal deformities early bunion, limitus, pes planus deformoity noted bilateral. Muscular strength 5/5 in all lower extremity muscular groups bilateral without pain on range of motion. No tenderness with calf compression bilateral.  Assessment and Plan: Problem List Items Addressed This Visit    None    Visit Diagnoses    Diabetic polyneuropathy associated with type 2 diabetes mellitus ([place])    -  Primary   Relevant Medications    gabapentin (NEURONTIN) 100 MG capsule   Pain due to onychomycosis of toenails of both feet          -Examined patient. -Discussed and educated patient on diabetic foot care, especially with  regards to the vascular, neurological and musculoskeletal systems.  -Stressed the importance of good glycemic control and the detriment of not  controlling glucose levels in relation to the foot. -Mechanically debrided all nails 1-5 bilateral using sterile nail nipper and filed with dremel without incident  -Office to arrange appointment for diabetic orthotics and shoes if qualifies  -Changed Rx for Gabapentin to 330m QHS -Answered all patient questions -Patient to return  in 3 months for at risk foot care -Patient advised to call the office if any problems or questions arise in the meantime.  TLandis Martins DPM

## 2018-05-05 NOTE — Patient Instructions (Signed)

## 2018-05-11 ENCOUNTER — Telehealth: Payer: Self-pay | Admitting: Medical

## 2018-05-11 NOTE — Telephone Encounter (Signed)
Copied from [place] [id number]. Topic: Quick Communication - Rx Refill/Question >> May 11, 2018  3:58 PM Oliver Pila B wrote: Medication: needles to prick finger and test strips for the one touch meter  Has the patient contacted their pharmacy? NO  (Agent: If no, request that the patient contact the pharmacy for the refill.) (Agent: If yes, when and what did the pharmacy advise?)  Preferred Pharmacy (with phone number or street name): [facility]  Agent: Please be advised that RX refills may take up to 3 business days. We ask that you follow-up with your pharmacy.

## 2018-05-11 NOTE — Telephone Encounter (Signed)
Spoke with Regan Rakers tech at [facility] [employer] who states that it appears the prescription refills for the glucose testing supplies were rejected by insurance. Call transferred to dispensing pharmacy and spoke with Caryl Pina and states that Medicare was rejecting the refill for the Onetouch testing strips because it was showing it was too soon for a refill and the pt should have 51 days left of the strips before the refill is needed.Pt picked up a 3 month supply of OneTouch testing strips in July.If the pt has been checking blood sugar different from what's on the original prescription, a new rx would need to be sent to the pharmacy in order to ensure the pt would have enough strips. Refill for lancets was approved.  Pt called and notified of this information. Pt states that he has been testing his blood glucose 3 times a day before meals and also a little after his meals, which totals the pt checking his glucose 6 times daily. Pt states that he does not currently have enough strips to last until insurance can cover the prescription in 51 days. Pt states that he is almost out of strips.

## 2018-05-12 ENCOUNTER — Ambulatory Visit: Payer: Medicare Other | Admitting: Orthotics

## 2018-05-12 DIAGNOSIS — E1142 Type 2 diabetes mellitus with diabetic polyneuropathy: Secondary | ICD-10-CM

## 2018-05-12 DIAGNOSIS — M2142 Flat foot [pes planus] (acquired), left foot: Secondary | ICD-10-CM

## 2018-05-12 DIAGNOSIS — M2141 Flat foot [pes planus] (acquired), right foot: Secondary | ICD-10-CM

## 2018-05-12 NOTE — Progress Notes (Signed)
Patient presents today for diabetic shoe measurement and foam casting.  Goals of diabetic shoes/inserts to offer protection from conditions secondary to DM2, offer relief from sheer forces that could lead to ulcerations, protect the foot, and offer greater stability. Patient is under supervision of DPM [place] Physician managing patients DM2: Silvestre Mesi, MD Patient has following documented conditions to qualify for diabetic shoes/inserts: dm2, pn, HAV, hallux rigidus, pes planus,  Patient measured with brannock device:105 M

## 2018-05-13 NOTE — Telephone Encounter (Signed)
Since he is not on insulin I think it is reasonable for him to check his sugar 1-2 times daily.  At this point his best bet is to buy a small quantity and pay cash until his insurance will cover another refill.

## 2018-05-14 NOTE — Telephone Encounter (Signed)
Left pt a message to call back. 

## 2018-05-16 NOTE — Progress Notes (Addendum)
[facility] [place], [place], [place] [address] [phone]  Date:  05/18/2018   Name:  Andrew [name]   DOB:  May 09, 1966   MRN:  [id number]  PCP:  [name], PA-C    Chief Complaint: Diabetes (diabetic exam)   History of Present Illness:  Andrew [name] is a 52 y.o. very pleasant male patient who presents with the following:  Pt of Percell Miller who I have not seen in the past He was dx with DM in June of this year with an A1c of 9.5 He notes that his feet are tingling for 2-3 months   Lab Results  Component Value Date   HGBA1C 9.5 (H) 03/24/2018   Needs pneumovax but he declines today  Eye exam: he needs a diabetic eye exam. He needs a referral to someone in [place]  Urine micro is due today as well   He is taking metformin 1000 once a day. He was not sure if he should go to twice a day- we will check on his A1c today  He notes that his glucose is doing pretty well on this dose. Glucose is 100- 120.  No issues with hypoglycemia- lowest to 100.   There is a family history of diabetes  Pt reports that he is checking his glucose up to 6x a day. He is running out of strips before insurance will pay for more. Advised that he does not need to check his glucose that often and that insurnace will generally not cover testing more than 2x/day for pts who are not on insulin  Reviewed his meter- his glucose readings are overall quite good, generally less than 120. I think we will get a better A1c today Patient Active Problem List   Diagnosis Date Noted  . Crack cocaine overdose ([place]) 09/28/2017  . Hypoglycemia 09/28/2017  . Hepatitis 09/28/2017  . Leucocytosis 09/28/2017  . Overdose 09/28/2017  . Arthritis of knee, degenerative 01/24/2015  . Right knee pain 01/07/2014  . Concern about STD in male without diagnosis 01/06/2014  . Dysuria 12/09/2013  . High cholesterol 10/25/2013  . GERD (gastroesophageal reflux  disease) 09/20/2013  . History of positive PPD 07/17/2012  . Cocaine abuse ([place]) 04/02/2012  . Rhabdomyolysis 04/02/2012  . Hernia of abdominal wall 01/05/2011  . ERECTILE DYSFUNCTION, PSYCHOGENIC 02/13/2010  . BIPOLAR DISORDER UNSPECIFIED 01/31/2009  . TOBACCO ABUSE 01/31/2009  . ELEVATED BP READING WITHOUT DX HYPERTENSION 01/31/2009    Past Medical History:  Diagnosis Date  . Anxiety   . Arthritis    right knee  . Back pain    occasionally  . Bipolar disorder ([place])    denies  . COPD (chronic obstructive pulmonary disease) ([place])   . Depression   . Diabetes mellitus without complication ([place])   . Dizziness   . Generalized headaches    history of migraines-last one 02/02/14  . GERD (gastroesophageal reflux disease)    takes Prilosec daily as needed  . GERD (gastroesophageal reflux disease)   . Headache   . Hemorrhoids   . History of bronchitis    1 month ago and treated with Amoxicillin and given an inhaler;takes Prenisone  . Hyperlipidemia    takes Zetia daily  . Hypertension    takes HCTZ daily  . Joint pain   . Joint swelling   . Pneumonia    hx of 2 yrs ago  . Polysubstance abuse ([place])   .  Schizoaffective disorder ([place])    denies  . Tuberculosis    treated for 1 year  . Umbilical hernia     Past Surgical History:  Procedure Laterality Date  . ESOPHAGOGASTRODUODENOSCOPY    . JOINT REPLACEMENT    . KNEE ARTHROSCOPY WITH MEDIAL MENISECTOMY Right 03/01/2014   Procedure: KNEE ARTHROSCOPY WITH MEDIAL MENISECTOMY;  Surgeon: Meredith Pel, MD;  Location: [facility];  Service: Orthopedics;  Laterality: Right;  . LIPOMA EXCISION  ~ 2010   "back left side of my head"  . PARTIAL KNEE ARTHROPLASTY Right 01/24/2015   Procedure: RIGHT KNEE MEDIAL COMPARTMENT REPLACEMENT VS TOTAL KNEE ARTHROPLASTY.;  Surgeon: Meredith Pel, MD;  Location: [facility];  Service: Orthopedics;  Laterality: Right;  RIGHT KNEE MEDIAL COMPARTMENT REPLACEMENT VS TOTAL KNEE ARTHROPLASTY.  . TOTAL  KNEE ARTHROPLASTY Right     Social History   Tobacco Use  . Smoking status: Current Every Day Smoker    Packs/day: 0.25    Years: 30.00    Pack years: 7.50  . Smokeless tobacco: Never Used  Substance Use Topics  . Alcohol use: No    Frequency: Never    Comment: occasionally   . Drug use: Yes    Types: Marijuana, Cocaine    Comment: cocaine, night before last    Family History  Problem Relation Age of Onset  . Hypertension Mother   . Cancer Mother   . Heart disease Father   . Heart attack Father 14  . Hyperlipidemia Father   . Hypertension Father   . Diabetes Sister   . Diabetes Brother     Allergies  Allergen Reactions  . Mushroom Extract Complex Hives  . Penicillins Hives  . Penicillins Hives    Has patient had a PCN reaction causing immediate rash, facial/tongue/throat swelling, SOB or lightheadedness with hypotension: no Has patient had a PCN reaction causing severe rash involving mucus membranes or skin necrosis: no Has patient had a PCN reaction that required hospitalization: no Has patient had a PCN reaction occurring within the last 10 years: yes If all of the above answers are "NO", then may proceed with Cephalosporin use.    Medication list has been reviewed and updated.  Current Outpatient Medications on File Prior to Visit  Medication Sig Dispense Refill  . gabapentin (NEURONTIN) 100 MG capsule Take 3 capsules (300 mg total) by mouth at bedtime. 30 capsule 0  . glucose blood (ONETOUCH VERIO) test strip Use as instructed to check blood sugar three times daily. DX E11.9 100 each 5  . metFORMIN (GLUCOPHAGE) 1000 MG tablet Take 1 tablet (1,000 mg total) by mouth 2 (two) times daily with a meal. 180 tablet 3  . OLANZapine (ZYPREXA) 5 MG tablet Take 5 mg by mouth at bedtime.    Glory Rosebush DELICA LANCETS 00T MISC USE TO CHECK BLOOD SUGAR 3 TIMES DAILY. DX E11.9 100 each 3  . sertraline (ZOLOFT) 50 MG tablet Take 50 mg by mouth daily.    . traZODone (DESYREL)  50 MG tablet Take 50 mg by mouth at bedtime.    . Vitamin D, Ergocalciferol, (DRISDOL) 50000 units CAPS capsule Take 1 capsule (50,000 Units total) by mouth every 7 (seven) days. 4 capsule 0   No current facility-administered medications on file prior to visit.     Review of Systems: No fever or chills He does tree service work  As per HPI- otherwise negative.   Physical Examination: Vitals:   05/18/18 0834  BP: 116/70  Pulse: 86  Resp: 16  SpO2: 98%   Vitals:   05/18/18 0834  Weight: 207 lb (93.9 kg)  Height: 5\' 7"  (1.702 m)   Body mass index is 32.42 kg/m. Ideal Body Weight: Weight in (lb) to have BMI = 25: 159.3  GEN: WDWN, NAD, Non-toxic, A & O x 3, large/ muscular build, looks well  HEENT: Atraumatic, Normocephalic. Neck supple. No masses, No LAD.  Bilateral TM wnl, oropharynx normal.  PEERL,EOMI.   Ears and Nose: No external deformity. CV: RRR, No M/G/R. No JVD. No thrill. No extra heart sounds. PULM: CTA B, no wheezes, crackles, rhonchi. No retractions. No resp. distress. No accessory muscle use. EXTR: No c/c/e NEURO Normal gait.  PSYCH: Normally interactive. Conversant. Not depressed or anxious appearing.  Calm demeanor.  Foot exam- he does have lack of monofilament sensation in both feet in toes and forefoot.  Heels are ok   Assessment and Plan: Diabetes mellitus without complication ([place]) - Plan: Microalbumin / creatinine urine ratio, Basic metabolic panel, Hemoglobin A1c, Ambulatory referral to Ophthalmology, Vitamin D, Ergocalciferol, (DRISDOL) 50000 units CAPS capsule  Neuropathy  Here today to check on his DM Will obtain his A1c today- it has not yet been 3 months but I would like to see any progress that he has made.  We may need to increase his metformin dosage.  He took HD vitamin D for 4 weeks only- will rx for another 8 weeks for 12 weeks total of therapy  Neuropathy- may get better with continued treatment of his DM/ better glucose control He is  wearing appropriate footwear and does not have any ulcerations or skin break-down  Declines pneumovax today Explained that insurance will not pay for 6x day testing strips. Suggested that he decrease to 1-2x a day  As an aside, I have asked our front office manager to NOT allow this pt to be scheduled with male providers in the future if possible. He made a few inappropriate comments to me today and I think it would be best for him to be seen by a male   Signed Lamar Blinks, MD   Received his labs, message to pt  Results for orders placed or performed in visit on 05/18/18  Microalbumin / creatinine urine ratio  Result Value Ref Range   Microalb, Ur 0.9 0.0 - 1.9 mg/dL   Creatinine,U 258.7 mg/dL   Microalb Creat Ratio 0.3 0.0 - 30.0 mg/g  Basic metabolic panel  Result Value Ref Range   Sodium 137 135 - 145 mEq/L   Potassium 4.2 3.5 - 5.1 mEq/L   Chloride 103 96 - 112 mEq/L   CO2 29 19 - 32 mEq/L   Glucose, Bld 102 (H) 70 - 99 mg/dL   BUN 14 6 - 23 mg/dL   Creatinine, Ser 1.01 0.40 - 1.50 mg/dL   Calcium 9.7 8.4 - 10.5 mg/dL   GFR 99.48 >60.00 mL/min  Hemoglobin A1c  Result Value Ref Range   Hgb A1c MFr Bld 7.2 (H) 4.6 - 6.5 %

## 2018-05-18 ENCOUNTER — Ambulatory Visit (INDEPENDENT_AMBULATORY_CARE_PROVIDER_SITE_OTHER): Payer: Medicare Other | Admitting: Family Medicine

## 2018-05-18 ENCOUNTER — Encounter: Payer: Self-pay | Admitting: Family Medicine

## 2018-05-18 ENCOUNTER — Telehealth: Payer: Self-pay

## 2018-05-18 VITALS — BP 116/70 | HR 86 | Resp 16 | Ht 67.0 in | Wt 207.0 lb

## 2018-05-18 DIAGNOSIS — E119 Type 2 diabetes mellitus without complications: Secondary | ICD-10-CM

## 2018-05-18 DIAGNOSIS — G629 Polyneuropathy, unspecified: Secondary | ICD-10-CM

## 2018-05-18 LAB — BASIC METABOLIC PANEL
BUN: 14 mg/dL (ref 6–23)
CO2: 29 mEq/L (ref 19–32)
Calcium: 9.7 mg/dL (ref 8.4–10.5)
Chloride: 103 mEq/L (ref 96–112)
Creatinine, Ser: 1.01 mg/dL (ref 0.40–1.50)
GFR: 99.48 mL/min (ref >60.00)
Glucose, Bld: 102 mg/dL — ABNORMAL HIGH (ref 70–99)
POTASSIUM: 4.2 meq/L (ref 3.5–5.1)
SODIUM: 137 meq/L (ref 135–145)

## 2018-05-18 LAB — MICROALBUMIN / CREATININE URINE RATIO
CREATININE, U: 258.7 mg/dL
MICROALB UR: 0.9 mg/dL (ref 0.0–1.9)
Microalb Creat Ratio: 0.3 mg/g (ref 0.0–30.0)

## 2018-05-18 LAB — HEMOGLOBIN A1C: HEMOGLOBIN A1C: 7.2 % — AB (ref 4.6–6.5)

## 2018-05-18 MED ORDER — VITAMIN D (ERGOCALCIFEROL) 1.25 MG (50000 UNIT) PO CAPS: 50000.0000 [IU] | ORAL_CAPSULE | ORAL | 0 refills | Status: DC

## 2018-05-18 NOTE — Patient Instructions (Signed)
It was good to see you today- I will be in touch with your labs as soon as possible It looks from your meter as though your glucose is doing ok- I expect we will see improvement in your A1c today I will also set you up to be seen by an eye doctor to check on your eyes- it is important to do this once a year You can check your blood sugar 1-2x a day- insurance will not pay for 6 strips a day  Please make an appt to see Percell Miller in 3 months

## 2018-05-18 NOTE — Telephone Encounter (Signed)
Will you see the referral to eye Specialist. Will call around to see which specialist would be willing to see him. Dr.Copland put the referral in. See Raquel Sarna note/comment.  Please let me know if successful getting him in or not.  Thanks, Percell Miller

## 2018-05-18 NOTE — Telephone Encounter (Signed)
[facility] called [facility] to relay that they cannot see pt. In their office as they do not accept medicaid patients. Routed to Percell Miller, PCP and [place], referral coordinator to offer alternative options for pt.

## 2018-05-19 NOTE — Telephone Encounter (Signed)
Referral sent to [facility], who does participate with medicare

## 2018-05-19 NOTE — Telephone Encounter (Signed)
Thanks for update on ophthalmology referral.

## 2018-05-20 ENCOUNTER — Ambulatory Visit: Payer: Medicare Other | Admitting: Medical

## 2018-05-21 ENCOUNTER — Telehealth: Payer: Self-pay | Admitting: *Deleted

## 2018-05-22 NOTE — Telephone Encounter (Signed)
Received request for Statement of "Certifying Physician for Therapeutic Shoes and Information from medical records on In-person Visit with DPM". Paperwork ewas sent to Dr. Lorelei Pont who seen pt on 05/18/18, his PCP is [name], who pt had not seen since 03/20/18 and when establishing care on 03/12/18. Will ask Dr. Lorelei Pont if this should be filed through PCP upon [place] on Monday/SLS 08/16

## 2018-05-25 ENCOUNTER — Encounter: Payer: Self-pay | Admitting: Medical

## 2018-05-25 DIAGNOSIS — R21 Rash and other nonspecific skin eruption: Secondary | ICD-10-CM | POA: Diagnosis not present

## 2018-05-25 DIAGNOSIS — Y999 Unspecified external cause status: Secondary | ICD-10-CM | POA: Diagnosis not present

## 2018-05-25 DIAGNOSIS — R109 Unspecified abdominal pain: Secondary | ICD-10-CM | POA: Diagnosis not present

## 2018-05-25 DIAGNOSIS — X500XXA Overexertion from strenuous movement or load, initial encounter: Secondary | ICD-10-CM | POA: Diagnosis not present

## 2018-05-25 DIAGNOSIS — M545 Low back pain: Secondary | ICD-10-CM | POA: Diagnosis not present

## 2018-06-05 ENCOUNTER — Ambulatory Visit: Payer: Medicare Other | Admitting: Medical

## 2018-06-09 ENCOUNTER — Ambulatory Visit: Payer: Medicare Other | Admitting: Medical

## 2018-06-09 ENCOUNTER — Telehealth: Payer: Self-pay | Admitting: Medical

## 2018-06-09 DIAGNOSIS — Z0289 Encounter for other administrative examinations: Secondary | ICD-10-CM

## 2018-06-09 NOTE — Telephone Encounter (Addendum)
How  No many shows has pt had?

## 2018-06-11 ENCOUNTER — Ambulatory Visit: Payer: Medicare Other | Admitting: Orthotics

## 2018-06-11 DIAGNOSIS — M79675 Pain in left toe(s): Secondary | ICD-10-CM

## 2018-06-11 DIAGNOSIS — E1142 Type 2 diabetes mellitus with diabetic polyneuropathy: Secondary | ICD-10-CM

## 2018-06-11 DIAGNOSIS — M2142 Flat foot [pes planus] (acquired), left foot: Secondary | ICD-10-CM

## 2018-06-11 DIAGNOSIS — M2141 Flat foot [pes planus] (acquired), right foot: Secondary | ICD-10-CM

## 2018-06-11 DIAGNOSIS — B351 Tinea unguium: Secondary | ICD-10-CM

## 2018-06-11 DIAGNOSIS — M79674 Pain in right toe(s): Secondary | ICD-10-CM

## 2018-06-11 NOTE — Progress Notes (Signed)
Reorder in 47M.Marland KitchenMarland KitchenRawson

## 2018-06-23 ENCOUNTER — Encounter

## 2018-06-23 ENCOUNTER — Ambulatory Visit (INDEPENDENT_AMBULATORY_CARE_PROVIDER_SITE_OTHER): Payer: Medicare Other | Admitting: Orthotics

## 2018-06-23 DIAGNOSIS — M2142 Flat foot [pes planus] (acquired), left foot: Secondary | ICD-10-CM | POA: Diagnosis not present

## 2018-06-23 DIAGNOSIS — M2141 Flat foot [pes planus] (acquired), right foot: Secondary | ICD-10-CM

## 2018-06-23 DIAGNOSIS — B351 Tinea unguium: Secondary | ICD-10-CM

## 2018-06-23 DIAGNOSIS — E1142 Type 2 diabetes mellitus with diabetic polyneuropathy: Secondary | ICD-10-CM | POA: Diagnosis not present

## 2018-06-23 DIAGNOSIS — M79674 Pain in right toe(s): Secondary | ICD-10-CM

## 2018-06-23 DIAGNOSIS — M79675 Pain in left toe(s): Secondary | ICD-10-CM

## 2018-06-23 NOTE — Progress Notes (Signed)

## 2018-07-09 DIAGNOSIS — J209 Acute bronchitis, unspecified: Secondary | ICD-10-CM | POA: Diagnosis not present

## 2018-07-09 DIAGNOSIS — R05 Cough: Secondary | ICD-10-CM | POA: Diagnosis not present

## 2018-07-09 DIAGNOSIS — R51 Headache: Secondary | ICD-10-CM | POA: Diagnosis not present

## 2018-07-09 NOTE — Telephone Encounter (Signed)
This was completed by Dr. Lorelei Pont and faxed with confirmation and OV notes/SLS

## 2018-07-21 ENCOUNTER — Encounter: Payer: Self-pay | Admitting: Medical

## 2018-07-29 DIAGNOSIS — E119 Type 2 diabetes mellitus without complications: Secondary | ICD-10-CM | POA: Diagnosis not present

## 2018-07-29 DIAGNOSIS — H524 Presbyopia: Secondary | ICD-10-CM | POA: Diagnosis not present

## 2018-07-29 DIAGNOSIS — H5213 Myopia, bilateral: Secondary | ICD-10-CM | POA: Diagnosis not present

## 2018-08-03 ENCOUNTER — Telehealth: Payer: Self-pay | Admitting: Medical

## 2018-08-03 ENCOUNTER — Telehealth: Payer: Self-pay | Admitting: *Deleted

## 2018-08-03 MED ORDER — GLUCOSE BLOOD VI STRP: ORAL_STRIP | 5 refills | Status: DC

## 2018-08-03 NOTE — Telephone Encounter (Unsigned)
Copied from [place] [phone]. Topic: Quick Communication - Rx Refill/Question >> Aug 03, 2018 11:08 AM Judyann Munson wrote: Medication: glucose blood (ONETOUCH VERIO) test strip  Has the patient contacted their pharmacy? no  Preferred Pharmacy (with phone number or street name): [id number] - [place], [place] - [address] AT [place] [phone] (Phone) [phone] (Fax)    Agent: Please be advised that RX refills may take up to 3 business days. We ask that you follow-up with your pharmacy.

## 2018-08-03 NOTE — Telephone Encounter (Signed)
Copied from [place] [id number]. Topic: General - Inquiry >> Aug 03, 2018 11:04 AM Judyann Munson wrote: Reason for CRM: Patient is calling to advise that  the appt for 06-09-18 was canceled, he stated he called in 2 days before to cancel the appt. He is upset due to receiving a letter for a 50$ no show fee. He is requesting a call back. Please advise

## 2018-08-04 ENCOUNTER — Ambulatory Visit: Payer: Medicare Other | Admitting: Sports Medicine

## 2018-09-10 ENCOUNTER — Encounter: Payer: Self-pay | Admitting: Medical

## 2018-09-10 ENCOUNTER — Ambulatory Visit (HOSPITAL_BASED_OUTPATIENT_CLINIC_OR_DEPARTMENT_OTHER)
Admission: RE | Admit: 2018-09-10 | Discharge: 2018-09-10 | Disposition: A | Discharge status: Home / Self Care | Payer: Medicare Other | Source: Ambulatory Visit | Attending: Medical | Admitting: Medical

## 2018-09-10 ENCOUNTER — Ambulatory Visit (INDEPENDENT_AMBULATORY_CARE_PROVIDER_SITE_OTHER): Payer: Medicare Other | Admitting: Medical

## 2018-09-10 VITALS — BP 130/80 | HR 73 | Temp 97.9°F | Resp 16 | Ht 67.0 in | Wt 219.8 lb

## 2018-09-10 DIAGNOSIS — M542 Cervicalgia: Secondary | ICD-10-CM | POA: Insufficient documentation

## 2018-09-10 DIAGNOSIS — N529 Male erectile dysfunction, unspecified: Secondary | ICD-10-CM | POA: Diagnosis not present

## 2018-09-10 DIAGNOSIS — E785 Hyperlipidemia, unspecified: Secondary | ICD-10-CM | POA: Diagnosis not present

## 2018-09-10 DIAGNOSIS — E1169 Type 2 diabetes mellitus with other specified complication: Secondary | ICD-10-CM | POA: Diagnosis not present

## 2018-09-10 DIAGNOSIS — R51 Headache: Secondary | ICD-10-CM

## 2018-09-10 DIAGNOSIS — R109 Unspecified abdominal pain: Secondary | ICD-10-CM

## 2018-09-10 DIAGNOSIS — E119 Type 2 diabetes mellitus without complications: Secondary | ICD-10-CM | POA: Diagnosis not present

## 2018-09-10 DIAGNOSIS — R519 Headache, unspecified: Secondary | ICD-10-CM

## 2018-09-10 MED ORDER — CYCLOBENZAPRINE HCL 5 MG PO TABS: ORAL_TABLET | ORAL | 0 refills | Status: DC

## 2018-09-10 MED ORDER — GLUCOSE BLOOD VI STRP: ORAL_STRIP | 5 refills | Status: DC

## 2018-09-10 MED ORDER — SAXAGLIPTIN HCL 5 MG PO TABS: 5.0000 mg | ORAL_TABLET | Freq: Every day | ORAL | 3 refills | Status: DC

## 2018-09-10 MED ORDER — ONETOUCH DELICA LANCETS 33G MISC: 3 refills | Status: DC

## 2018-09-10 MED ORDER — DICLOFENAC SODIUM 75 MG PO TBEC: DELAYED_RELEASE_TABLET | ORAL | 0 refills | Status: DC

## 2018-09-10 MED ORDER — SILDENAFIL CITRATE 50 MG PO TABS: 50.0000 mg | ORAL_TABLET | Freq: Every day | ORAL | 0 refills | Status: DC | PRN

## 2018-09-10 NOTE — Patient Instructions (Addendum)
Your A1c has improved over the last 4 to 5 months.  Very close to goal of 6.5.  Unfortunately you cannot tolerate side effects of metformin.  So were going to DC metformin and I am going to prescribe you Onglyza.  Hopefully that will be covered by your insurance.  For history of high cholesterol, I am going to put in future labs so you can get those done nonfasting.   You do report some abdomen pain which you associate with metformin.  There are GI side effects with metformin but not entirely convinced that metformin is causing the symptoms.  So we will get labs today to evaluate other potential causes.  1 lab would be H. pylori breath test.  He reports some neck pain recently and when you get upset some mild headache.  This might represent a tension headache.  If you do get neck pain associated with mild headache after getting upset then I am providing you low-dose Flexeril to take 1 tablet a day.  Try to take it later in the day when you are at home or before sleep.  Sedation is side effect.  Also will get x-ray of your cervical spine.  You report that your blood pressure readings at home are high but today in our office we got 111/67 and 130/80.  I want you to check your blood pressure daily and bring in your blood pressure monitor on follow-up in 10 days.  We need to compare your monitor to our reading in office.  If blood pressure is elevated then we will give you medication to drop your blood pressure and protect your kidneys since you are diabetic.  Also sending a prescription of a medication called diclofenac to your pharmacy.  You could take this for headache but I would asked that you check your blood pressure before taking the tablet.  If your blood pressure is over 140/90 then do not take diclofenac.  In that event would just recommend that you take Tylenol.   For erectile dysfunction I rx'd viagra.  Follow-up in 10 days or as needed.

## 2018-09-10 NOTE — Progress Notes (Signed)
Subjective:    Patient ID: Andrew [name], male    DOB: Aug 04, 1966, 52 y.o.   MRN: [id number]  HPI   Pt in for follow up.  Pt a1c has improved from 9.5 to 7.2 over past 5 months.  He has been on metformin. He has some occasional loose stool 3-4 times out of the week.   Pt is attributing metformin intermittent crampiness in stomach. Also states that he is feeling weak when takes metformin.  He basically is adament he won't take metformin.   He states pain in neck about every other day. He states he will get neck pain when gets upset about things. States will get temporal ha. No gross motor or sensory function deficits.   Pt also states sometimes checks his blood pressure at home and his blood pressure reading 160-180/100 at night.  Also complains of ED since starting metformin.     Review of Systems  Constitutional: Negative for chills and fatigue.       Fatigue with metformin.  Respiratory: Negative for chest tightness, shortness of breath, wheezing and stridor.   Cardiovascular: Negative for chest pain and palpitations.  Gastrointestinal: Positive for abdominal pain. Negative for diarrhea and rectal pain.       With metformin  Genitourinary: Negative for dysuria, frequency, hematuria and testicular pain.       ED  Musculoskeletal: Negative for back pain, neck pain and neck stiffness.  Skin: Negative for pallor and rash.  Neurological: Positive for headaches. Negative for dizziness, speech difficulty, weakness and light-headedness.       No current ha.  Hematological: Negative for adenopathy.  Psychiatric/Behavioral: Negative for behavioral problems, confusion and sleep disturbance. The patient is not nervous/anxious.    Past Medical History:  Diagnosis Date  . Anxiety   . Arthritis    right knee  . Back pain    occasionally  . Bipolar disorder ([place])    denies  . COPD (chronic obstructive pulmonary disease) ([place])   . Depression   . Diabetes mellitus without  complication ([place])   . Dizziness   . Generalized headaches    history of migraines-last one 02/02/14  . GERD (gastroesophageal reflux disease)    takes Prilosec daily as needed  . GERD (gastroesophageal reflux disease)   . Headache   . Hemorrhoids   . History of bronchitis    1 month ago and treated with Amoxicillin and given an inhaler;takes Prenisone  . Hyperlipidemia    takes Zetia daily  . Hypertension    takes HCTZ daily  . Joint pain   . Joint swelling   . Pneumonia    hx of 2 yrs ago  . Polysubstance abuse ([place])   . Schizoaffective disorder ([place])    denies  . Tuberculosis    treated for 1 year  . Umbilical hernia      Social History   Socioeconomic History  . Marital status: Single    Spouse name: Not on file  . Number of children: Not on file  . Years of education: Not on file  . Highest education level: Not on file  Occupational History  . Occupation: lifts and pulls concrete    Employer: [employer]   Social Needs  . Financial resource strain: Not on file  . Food insecurity:    Worry: Not on file    Inability: Not on file  . Transportation needs:    Medical: Not on file    Non-medical: Not on file  Tobacco Use  . Smoking status: Current Every Day Smoker    Packs/day: 0.25    Years: 30.00    Pack years: 7.50  . Smokeless tobacco: Never Used  Substance and Sexual Activity  . Alcohol use: No    Frequency: Never    Comment: occasionally   . Drug use: Yes    Types: Marijuana, Cocaine    Comment: cocaine, night before last  . Sexual activity: Yes  Lifestyle  . Physical activity:    Days per week: Not on file    Minutes per session: Not on file  . Stress: Not on file  Relationships  . Social connections:    Talks on phone: Not on file    Gets together: Not on file    Attends religious service: Not on file    Active member of club or organization: Not on file    Attends meetings of clubs or organizations: Not on file    Relationship status: Not  on file  . Intimate partner violence:    Fear of current or ex partner: Not on file    Emotionally abused: Not on file    Physically abused: Not on file    Forced sexual activity: Not on file  Other Topics Concern  . Not on file  Social History Narrative   ** Merged History Encounter **       Lives with girlfriend. Work involves a lot of pulling and lifting concrete, was not definite about type of work. Denies use of illegal drugs. Smokes half pack of cigarettes daily.    Past Surgical History:  Procedure Laterality Date  . ESOPHAGOGASTRODUODENOSCOPY    . JOINT REPLACEMENT    . KNEE ARTHROSCOPY WITH MEDIAL MENISECTOMY Right 03/01/2014   Procedure: KNEE ARTHROSCOPY WITH MEDIAL MENISECTOMY;  Surgeon: Meredith Pel, MD;  Location: [facility];  Service: Orthopedics;  Laterality: Right;  . LIPOMA EXCISION  ~ 2010   "back left side of my head"  . PARTIAL KNEE ARTHROPLASTY Right 01/24/2015   Procedure: RIGHT KNEE MEDIAL COMPARTMENT REPLACEMENT VS TOTAL KNEE ARTHROPLASTY.;  Surgeon: Meredith Pel, MD;  Location: [facility];  Service: Orthopedics;  Laterality: Right;  RIGHT KNEE MEDIAL COMPARTMENT REPLACEMENT VS TOTAL KNEE ARTHROPLASTY.  . TOTAL KNEE ARTHROPLASTY Right     Family History  Problem Relation Age of Onset  . Hypertension Mother   . Cancer Mother   . Heart disease Father   . Heart attack Father 24  . Hyperlipidemia Father   . Hypertension Father   . Diabetes Sister   . Diabetes Brother     Allergies  Allergen Reactions  . Mushroom Extract Complex Hives  . Penicillins Hives  . Penicillins Hives    Has patient had a PCN reaction causing immediate rash, facial/tongue/throat swelling, SOB or lightheadedness with hypotension: no Has patient had a PCN reaction causing severe rash involving mucus membranes or skin necrosis: no Has patient had a PCN reaction that required hospitalization: no Has patient had a PCN reaction occurring within the last 10 years: yes If all of the  above answers are "NO", then may proceed with Cephalosporin use.    Current Outpatient Medications on File Prior to Visit  Medication Sig Dispense Refill  . gabapentin (NEURONTIN) 100 MG capsule Take 3 capsules (300 mg total) by mouth at bedtime. 30 capsule 0  . glucose blood (ONETOUCH VERIO) test strip Use as instructed to check blood sugar three times daily. DX E11.9 100 each 5  .  metFORMIN (GLUCOPHAGE) 1000 MG tablet Take 1 tablet (1,000 mg total) by mouth 2 (two) times daily with a meal. 180 tablet 3  . OLANZapine (ZYPREXA) 5 MG tablet Take 5 mg by mouth at bedtime.    Glory Rosebush DELICA LANCETS 04V MISC USE TO CHECK BLOOD SUGAR 3 TIMES DAILY. DX E11.9 100 each 3  . sertraline (ZOLOFT) 50 MG tablet Take 50 mg by mouth daily.    . traZODone (DESYREL) 50 MG tablet Take 50 mg by mouth at bedtime.    . Vitamin D, Ergocalciferol, (DRISDOL) 50000 units CAPS capsule Take 1 capsule (50,000 Units total) by mouth every 7 (seven) days. 8 capsule 0   No current facility-administered medications on file prior to visit.     BP 111/67 (BP Location: Right Arm, Cuff Size: Large)   Pulse 73   Temp 97.9 F (36.6 C) (Oral)   Resp 16   Ht 5\' 7"  (1.702 m)   Wt 219 lb 12.8 oz (99.7 kg)   SpO2 99%   BMI 34.43 kg/m      Objective:   Physical Exam  General Mental Status- Alert. General Appearance- Not in acute distress.   Skin General: Color- Normal Color. Moisture- Normal Moisture.  Neck Carotid Arteries- Normal color. Moisture- Normal Moisture. No carotid bruits. No JVD.  Chest and Lung Exam Auscultation: Breath Sounds:-Normal.  Cardiovascular Auscultation:Rythm- Regular. Murmurs & Other Heart Sounds:Auscultation of the heart reveals- No Murmurs.  Abdomen Inspection:-Inspeection Normal. Palpation/Percussion:Note:No mass. Palpation and Percussion of the abdomen reveal- Non Tender, Non Distended + BS, no rebound or guarding.    Neurologic Cranial Nerve exam:- CN III-XII intact(No  nystagmus), symmetric smile. Strength:- 5/5 equal and symmetric strength both upper and lower extremities.      Assessment & Plan:   Your A1c has improved over the last 4 to 5 months.  Very close to goal of 6.5.  Unfortunately you cannot tolerate side effects of metformin.  So were going to DC metformin and I am going to prescribe you Onglyza.  Hopefully that will be covered by your insurance.   For history of high cholesterol, I am going to put in future labs so you can get those done nonfasting.   You do report some abdomen pain which you associate with metformin.  There are GI side effects with metformin but not entirely convinced that metformin is causing the symptoms.  So we will get labs today to evaluate other potential causes.  1 lab would be H. pylori breath test.  He reports some neck pain recently and when you get upset some mild headache.  This might represent a tension headache.  If you do get neck pain associated with mild headache after getting upset then I am providing you low-dose Flexeril to take 1 tablet a day.  Try to take it later in the day when you are at home or before sleep.  Sedation is side effect.  Also will get x-ray of your cervical spine.   You report that your blood pressure readings at home are high but today in our office we got 111/67 and 130/80.  I want you to check your blood pressure daily and bring in your blood pressure monitor on follow-up in 10 days.  We need to compare your monitor to our reading in office.  If blood pressure is elevated then we will give you medication to drop your blood pressure and protect your kidneys since you are diabetic.  Also sending a prescription of a  medication called diclofenac to your pharmacy.  You could take this for headache but I would asked that you check your blood pressure before taking the tablet.  If your blood pressure is over 140/90 then do not take diclofenac.  In that event would just recommend that you take  Tylenol.   For erectile dysfunction I rx'd viagra.  Follow-up in 10 days or as needed.  40 minutes spent with pt 50% of time spent counseling on various diagnosis treatment plans.

## 2018-09-11 ENCOUNTER — Telehealth: Payer: Self-pay | Admitting: Medical

## 2018-09-11 DIAGNOSIS — M542 Cervicalgia: Secondary | ICD-10-CM

## 2018-09-11 NOTE — Telephone Encounter (Signed)
Referral to orthopedist made.

## 2018-09-15 ENCOUNTER — Telehealth: Payer: Self-pay

## 2018-09-15 NOTE — Telephone Encounter (Signed)
Pt. returned call. Author explained the bone spur finding and reason for sports med referral. Pt. stated he has an appointment with Dr. Barbaraann Barthel 12/19. Pt. Also has appointment on 12/13 with PCP, planning to get labs drawn then. No other concerns at this time.

## 2018-09-15 NOTE — Telephone Encounter (Signed)
Author phoned pt. to relay cervical XR results, and remind him about outstanding lab orders. No answer; author left generic VM asking for return call.

## 2018-09-18 ENCOUNTER — Ambulatory Visit: Payer: Medicare Other | Admitting: Medical

## 2018-09-18 ENCOUNTER — Other Ambulatory Visit (INDEPENDENT_AMBULATORY_CARE_PROVIDER_SITE_OTHER): Payer: Medicare Other

## 2018-09-18 ENCOUNTER — Telehealth: Payer: Self-pay | Admitting: Medical

## 2018-09-18 DIAGNOSIS — E119 Type 2 diabetes mellitus without complications: Secondary | ICD-10-CM | POA: Diagnosis not present

## 2018-09-18 DIAGNOSIS — E785 Hyperlipidemia, unspecified: Secondary | ICD-10-CM

## 2018-09-18 DIAGNOSIS — E1169 Type 2 diabetes mellitus with other specified complication: Secondary | ICD-10-CM | POA: Diagnosis not present

## 2018-09-18 DIAGNOSIS — R109 Unspecified abdominal pain: Secondary | ICD-10-CM | POA: Diagnosis not present

## 2018-09-18 LAB — COMPREHENSIVE METABOLIC PANEL
ALK PHOS: 83 U/L (ref 39–117)
ALT: 20 U/L (ref 0–53)
AST: 23 U/L (ref 0–37)
Albumin: 4.2 g/dL (ref 3.5–5.2)
BUN: 13 mg/dL (ref 6–23)
CO2: 24 mEq/L (ref 19–32)
Calcium: 9.4 mg/dL (ref 8.4–10.5)
Chloride: 106 mEq/L (ref 96–112)
Creatinine, Ser: 0.83 mg/dL (ref 0.40–1.50)
GFR: 124.6 mL/min (ref >60.00)
Glucose, Bld: 99 mg/dL (ref 70–99)
Potassium: 4.4 mEq/L (ref 3.5–5.1)
Sodium: 138 mEq/L (ref 135–145)
Total Bilirubin: 0.6 mg/dL (ref 0.2–1.2)
Total Protein: 7 g/dL (ref 6.0–8.3)

## 2018-09-18 LAB — CBC WITH DIFFERENTIAL/PLATELET
Basophils Absolute: 0 10*3/uL (ref 0.0–0.1)
Basophils Relative: 0.5 % (ref 0.0–3.0)
Eosinophils Absolute: 0.1 10*3/uL (ref 0.0–0.7)
Eosinophils Relative: 2 % (ref 0.0–5.0)
HCT: 46.9 % (ref 39.0–52.0)
Hemoglobin: 16.1 g/dL (ref 13.0–17.0)
Lymphocytes Relative: 32 % (ref 12.0–46.0)
Lymphs Abs: 1.8 10*3/uL (ref 0.7–4.0)
MCHC: 34.4 g/dL (ref 30.0–36.0)
MCV: 93.1 fl (ref 78.0–100.0)
MONO ABS: 0.6 10*3/uL (ref 0.1–1.0)
Monocytes Relative: 10.3 % (ref 3.0–12.0)
Neutro Abs: 3.1 10*3/uL (ref 1.4–7.7)
Neutrophils Relative %: 55.2 % (ref 43.0–77.0)
Platelets: 283 10*3/uL (ref 150.0–400.0)
RBC: 5.03 Mil/uL (ref 4.22–5.81)
RDW: 13.2 % (ref 11.5–15.5)
WBC: 5.6 10*3/uL (ref 4.0–10.5)

## 2018-09-18 LAB — LIPID PANEL
CHOLESTEROL: 171 mg/dL (ref 0–200)
HDL: 38.3 mg/dL — ABNORMAL LOW (ref >39.00)
LDL CALC: 118 mg/dL — AB (ref 0–99)
NonHDL: 132.47
Total CHOL/HDL Ratio: 4
Triglycerides: 71 mg/dL (ref 0.0–149.0)
VLDL: 14.2 mg/dL (ref 0.0–40.0)

## 2018-09-18 LAB — LIPASE: LIPASE: 24 U/L (ref 11.0–59.0)

## 2018-09-18 LAB — HEMOGLOBIN A1C: Hgb A1c MFr Bld: 6.3 % (ref 4.6–6.5)

## 2018-09-18 LAB — AMYLASE: Amylase: 50 U/L (ref 27–131)

## 2018-09-18 NOTE — Telephone Encounter (Signed)
Copied from [place] [phone]. Topic: Quick Communication - See Telephone Encounter >> Sep 18, 2018  4:56 PM Blase Mess A wrote: CRM for notification. See Telephone encounter for: 09/18/18.  Patient is calling regarding one of the test that he took. Please advise [phone]

## 2018-09-21 ENCOUNTER — Telehealth: Payer: Self-pay | Admitting: *Deleted

## 2018-09-21 ENCOUNTER — Telehealth: Payer: Self-pay | Admitting: Medical

## 2018-09-21 LAB — H. PYLORI BREATH TEST: H. pylori Breath Test: NOT DETECTED

## 2018-09-21 MED ORDER — ATORVASTATIN CALCIUM 10 MG PO TABS: 10.0000 mg | ORAL_TABLET | Freq: Every day | ORAL | 3 refills | Status: DC

## 2018-09-21 NOTE — Telephone Encounter (Signed)
Author phoned pt. To relay recent 12/13 lab results. No answer. Author left generic VM asking for return call. OK for PEC to relay [employer] comments on lab results dated 12/14.

## 2018-09-21 NOTE — Telephone Encounter (Signed)
Patient called for results: Patient informed of the following H pylori/ bacteria breat test was negative. ------  Notes recorded by [name], PA-C on 09/19/2018 at 10:18 PM EST Your pancreas proteins were normal. Your LDL cholesterol was elevated at level of 118. Your good cholesterol was mildly decreased. Recommend low-cholesterol diet and exercise regularly. Your kidney function looked good and normal liver enzymes on labs. Infection fighting cells were normal and no anemia. Your three-month blood sugar average is about 130 now. Much better than before. You did respond very well to metformin but reported side effects with metformin. This was unfortunate as your sugars did come down. Did you get the new medication filled? Was not covered under insurance? Below is your 10-year cardiovascular risk score. Although your cholesterol numbers are not real high it is recommended to use statins to reduce risk of cardiovascular disease. Are you okay with me prescribing medication called atorvastatin. I would prescribe a low dose and says your pharmacy if you are okay with this. Please let me know and I will go ahead and send that prescription and if you give it okay. Will need to follow-up in 3 months to repeat 28-month blood sugar average, metabolic panel and fasting cholesterol.  The 10-year ASCVD risk score Mikey Bussing DC Brooke Bonito., et al., 2013) is: 19.9% Values used to calculate the score:  Age: 52 years  Sex: Male  Is Non-Hispanic African American: Yes  Diabetic: Yes  Tobacco smoker: Yes  Systolic Blood Pressure: 347 mmHg  Is BP treated: No  HDL Cholesterol: 38.3 mg/dL  Total Cholesterol: 171 mg/dL  **Patient wants to continue the new glucose medication - it is covered by his insurance. Patient is willing to try the atorvastatin. Please send 3 month supply to the pharmacy for him.   Patient states the Viagra was super expensive - is there something cheaper  that can be prescribed.   Lab appointment has been scheduled.

## 2018-09-21 NOTE — Telephone Encounter (Signed)
Rx atorvastatin sent to pt pharmacy. 

## 2018-09-22 ENCOUNTER — Ambulatory Visit: Payer: Self-pay

## 2018-09-22 NOTE — Telephone Encounter (Signed)
  Pt called today to say Viagra is too expensive. He wants Cialis ordered.  Pt wants it to go to [facility]. [place]. Reason for Disposition . Caller requesting a NON-URGENT new prescription or refill and triager unable to refill per unit policy  Answer Assessment - Initial Assessment Questions 1. SYMPTOMS: "Do you have any symptoms?"    none 2. SEVERITY: If symptoms are present, ask "Are they mild, moderate or severe?"     Pt states that Viagra is to expensive and wants medication changed to Cialis  Protocols used: MEDICATION QUESTION CALL-A-AH

## 2018-09-24 ENCOUNTER — Ambulatory Visit: Payer: Medicare Other | Admitting: Medical

## 2018-09-24 ENCOUNTER — Ambulatory Visit: Payer: Medicare Other | Admitting: Family Medicine

## 2018-09-24 MED ORDER — TADALAFIL 10 MG PO TABS: 10.0000 mg | ORAL_TABLET | Freq: Every day | ORAL | 0 refills | Status: DC | PRN

## 2018-09-24 NOTE — Telephone Encounter (Signed)
Rx cialis sent to pt pharmacy. DC viagra. Sent at pt request.

## 2018-09-24 NOTE — Addendum Note (Signed)
Addended by: Anabel Halon on: 09/24/2018 08:20 AM   Modules accepted: Orders

## 2018-10-06 ENCOUNTER — Encounter: Payer: Self-pay | Admitting: Family Medicine

## 2018-10-06 ENCOUNTER — Ambulatory Visit (INDEPENDENT_AMBULATORY_CARE_PROVIDER_SITE_OTHER): Payer: Medicare Other | Admitting: Family Medicine

## 2018-10-06 VITALS — BP 122/85 | HR 71 | Ht 68.0 in | Wt 222.0 lb

## 2018-10-06 DIAGNOSIS — M542 Cervicalgia: Secondary | ICD-10-CM | POA: Diagnosis not present

## 2018-10-06 MED ORDER — METHOCARBAMOL 500 MG PO TABS: 500.0000 mg | ORAL_TABLET | Freq: Three times a day (TID) | ORAL | 1 refills | Status: DC | PRN

## 2018-10-06 NOTE — Progress Notes (Signed)
PCP and consultation requested by: [name], PA-C  Subjective:   HPI: Patient is a 52 y.o. male here for neck pain.  Patient reports for about 2-3 months he's had off and on pain posterior neck. Associated headaches with this as well. Pain up to 7/10 and sharp. Worse with anxiety, stress, if he gets mad. Has tried diclofenac and flexeril but without much benefit. No radiation into extremities. No numbness, tingling. No bowel/bladder dysfunction.  Past Medical History:  Diagnosis Date  . Anxiety   . Arthritis    right knee  . Back pain    occasionally  . Bipolar disorder ([place])    denies  . COPD (chronic obstructive pulmonary disease) ([place])   . Depression   . Diabetes mellitus without complication ([place])   . Dizziness   . Generalized headaches    history of migraines-last one 02/02/14  . GERD (gastroesophageal reflux disease)    takes Prilosec daily as needed  . GERD (gastroesophageal reflux disease)   . Headache   . Hemorrhoids   . History of bronchitis    1 month ago and treated with Amoxicillin and given an inhaler;takes Prenisone  . Hyperlipidemia    takes Zetia daily  . Hypertension    takes HCTZ daily  . Joint pain   . Joint swelling   . Pneumonia    hx of 2 yrs ago  . Polysubstance abuse ([place])   . Schizoaffective disorder ([place])    denies  . Tuberculosis    treated for 1 year  . Umbilical hernia     Current Outpatient Medications on File Prior to Visit  Medication Sig Dispense Refill  . atorvastatin (LIPITOR) 10 MG tablet Take 1 tablet (10 mg total) by mouth daily. 30 tablet 3  . diclofenac (VOLTAREN) 75 MG EC tablet 1 tab po bid prn ha. 20 tablet 0  . gabapentin (NEURONTIN) 100 MG capsule Take 3 capsules (300 mg total) by mouth at bedtime. 30 capsule 0  . glucose blood (ONETOUCH VERIO) test strip Use as instructed to check blood sugar three times daily. DX E11.9 100 each 5  . OLANZapine (ZYPREXA) 5 MG tablet Take 5 mg by mouth at bedtime.    Glory Rosebush DELICA LANCETS 27N MISC USE TO CHECK BLOOD SUGAR 3 TIMES DAILY. DX E11.9 100 each 3  . saxagliptin HCl (ONGLYZA) 5 MG TABS tablet Take 1 tablet (5 mg total) by mouth daily. 30 tablet 3  . sertraline (ZOLOFT) 50 MG tablet Take 50 mg by mouth daily.    . tadalafil (CIALIS) 10 MG tablet Take 1 tablet (10 mg total) by mouth daily as needed for erectile dysfunction. 10 tablet 0  . traZODone (DESYREL) 50 MG tablet Take 50 mg by mouth at bedtime.    . Vitamin D, Ergocalciferol, (DRISDOL) 50000 units CAPS capsule Take 1 capsule (50,000 Units total) by mouth every 7 (seven) days. 8 capsule 0   No current facility-administered medications on file prior to visit.     Past Surgical History:  Procedure Laterality Date  . ESOPHAGOGASTRODUODENOSCOPY    . JOINT REPLACEMENT    . KNEE ARTHROSCOPY WITH MEDIAL MENISECTOMY Right 03/01/2014   Procedure: KNEE ARTHROSCOPY WITH MEDIAL MENISECTOMY;  Surgeon: Meredith Pel, MD;  Location: [facility];  Service: Orthopedics;  Laterality: Right;  . LIPOMA EXCISION  ~ 2010   "back left side of my head"  . PARTIAL KNEE ARTHROPLASTY Right 01/24/2015   Procedure: RIGHT KNEE MEDIAL COMPARTMENT REPLACEMENT VS TOTAL KNEE  ARTHROPLASTY.;  Surgeon: Meredith Pel, MD;  Location: [facility];  Service: Orthopedics;  Laterality: Right;  RIGHT KNEE MEDIAL COMPARTMENT REPLACEMENT VS TOTAL KNEE ARTHROPLASTY.  . TOTAL KNEE ARTHROPLASTY Right     Allergies  Allergen Reactions  . Mushroom Extract Complex Hives  . Penicillins Hives  . Penicillins Hives    Has patient had a PCN reaction causing immediate rash, facial/tongue/throat swelling, SOB or lightheadedness with hypotension: no Has patient had a PCN reaction causing severe rash involving mucus membranes or skin necrosis: no Has patient had a PCN reaction that required hospitalization: no Has patient had a PCN reaction occurring within the last 10 years: yes If all of the above answers are "NO", then may proceed with  Cephalosporin use.    Social History   Socioeconomic History  . Marital status: Single    Spouse name: Not on file  . Number of children: Not on file  . Years of education: Not on file  . Highest education level: Not on file  Occupational History  . Occupation: lifts and pulls concrete    Employer: [employer]   Social Needs  . Financial resource strain: Not on file  . Food insecurity:    Worry: Not on file    Inability: Not on file  . Transportation needs:    Medical: Not on file    Non-medical: Not on file  Tobacco Use  . Smoking status: Current Every Day Smoker    Packs/day: 0.25    Years: 30.00    Pack years: 7.50  . Smokeless tobacco: Never Used  Substance and Sexual Activity  . Alcohol use: No    Frequency: Never    Comment: occasionally   . Drug use: Yes    Types: Marijuana, Cocaine    Comment: cocaine, night before last  . Sexual activity: Yes  Lifestyle  . Physical activity:    Days per week: Not on file    Minutes per session: Not on file  . Stress: Not on file  Relationships  . Social connections:    Talks on phone: Not on file    Gets together: Not on file    Attends religious service: Not on file    Active member of club or organization: Not on file    Attends meetings of clubs or organizations: Not on file    Relationship status: Not on file  . Intimate partner violence:    Fear of current or ex partner: Not on file    Emotionally abused: Not on file    Physically abused: Not on file    Forced sexual activity: Not on file  Other Topics Concern  . Not on file  Social History Narrative   ** Merged History Encounter **       Lives with girlfriend. Work involves a lot of pulling and lifting concrete, was not definite about type of work. Denies use of illegal drugs. Smokes half pack of cigarettes daily.    Family History  Problem Relation Age of Onset  . Hypertension Mother   . Cancer Mother   . Heart disease Father   . Heart attack Father 65   . Hyperlipidemia Father   . Hypertension Father   . Diabetes Sister   . Diabetes Brother     BP 122/85   Pulse 71   Ht 5\' 8"  (1.727 m)   Wt 222 lb (100.7 kg)   BMI 33.75 kg/m   Review of Systems: See HPI above.  Objective:  Physical Exam:  Gen: NAD, comfortable in exam room  Neck: No gross deformity, swelling, bruising. TTP mildly cervical paraspinal muscles near base of skull.  No midline/bony TTP. Full flexion, 15 degrees extension, 25 degrees bilateral rotation. BUE strength 5/5. Sensation intact to light touch.   2+ equal reflexes in triceps, biceps, brachioradialis tendons. NV intact distal BUEs.  Bilateral shoulders: No swelling, ecchymoses.  No gross deformity. No TTP. FROM. Strength 5/5 with empty can and resisted internal/external rotation. NV intact distally.   Assessment & Plan:  1. Neck pain - independently reviewed radiographs and noted idiopathic skeletal hyperostosis.  His motion is surprisingly better than would expect based on his radiographs.  Has some secondary cervical spasms as well.  Start physical therapy.  Diclofenac with robaxin as needed.  Topical aspercreme, heat, salon pas also reviewed.  F/u in 5-6 weeks.

## 2018-10-06 NOTE — Patient Instructions (Addendum)
You have skeletal hyperostosis with cervical spasms. Take the diclofenac twice a day with food as needed for pain and inflammation. Robaxin as needed for muscle spasms instead of the flexeril. Start home exercise program daily - call me if you want to do physical therapy. Topical aspercreme up to 4 times a day, salon pas patches may be helpful also. Follow up with me in 5-6 weeks.

## 2018-10-28 ENCOUNTER — Emergency Department (HOSPITAL_COMMUNITY): Payer: Medicare Other

## 2018-10-28 ENCOUNTER — Encounter (HOSPITAL_COMMUNITY): Payer: Self-pay | Admitting: Emergency Medicine

## 2018-10-28 ENCOUNTER — Emergency Department (HOSPITAL_COMMUNITY)
Admission: EM | Admit: 2018-10-28 | Discharge: 2018-10-28 | Disposition: A | Discharge status: Home / Self Care | Payer: Medicare Other | Attending: Emergency Medicine | Admitting: Emergency Medicine

## 2018-10-28 DIAGNOSIS — E119 Type 2 diabetes mellitus without complications: Secondary | ICD-10-CM | POA: Diagnosis not present

## 2018-10-28 DIAGNOSIS — R0602 Shortness of breath: Secondary | ICD-10-CM | POA: Insufficient documentation

## 2018-10-28 DIAGNOSIS — Z96651 Presence of right artificial knee joint: Secondary | ICD-10-CM | POA: Insufficient documentation

## 2018-10-28 DIAGNOSIS — B349 Viral infection, unspecified: Secondary | ICD-10-CM

## 2018-10-28 DIAGNOSIS — R112 Nausea with vomiting, unspecified: Secondary | ICD-10-CM | POA: Diagnosis not present

## 2018-10-28 DIAGNOSIS — J449 Chronic obstructive pulmonary disease, unspecified: Secondary | ICD-10-CM | POA: Diagnosis not present

## 2018-10-28 DIAGNOSIS — R509 Fever, unspecified: Secondary | ICD-10-CM | POA: Diagnosis not present

## 2018-10-28 DIAGNOSIS — J984 Other disorders of lung: Secondary | ICD-10-CM | POA: Diagnosis not present

## 2018-10-28 DIAGNOSIS — R05 Cough: Secondary | ICD-10-CM | POA: Diagnosis not present

## 2018-10-28 DIAGNOSIS — I10 Essential (primary) hypertension: Secondary | ICD-10-CM | POA: Insufficient documentation

## 2018-10-28 DIAGNOSIS — Z79899 Other long term (current) drug therapy: Secondary | ICD-10-CM | POA: Insufficient documentation

## 2018-10-28 DIAGNOSIS — J029 Acute pharyngitis, unspecified: Secondary | ICD-10-CM | POA: Diagnosis not present

## 2018-10-28 DIAGNOSIS — F17210 Nicotine dependence, cigarettes, uncomplicated: Secondary | ICD-10-CM | POA: Insufficient documentation

## 2018-10-28 LAB — COMPREHENSIVE METABOLIC PANEL
ALT: 29 U/L (ref 0–44)
AST: 29 U/L (ref 15–41)
Albumin: 3.6 g/dL (ref 3.5–5.0)
Alkaline Phosphatase: 85 U/L (ref 38–126)
Anion gap: 9 (ref 5–15)
BILIRUBIN TOTAL: 0.5 mg/dL (ref 0.3–1.2)
BUN: 11 mg/dL (ref 6–20)
CO2: 25 mmol/L (ref 22–32)
Calcium: 9.2 mg/dL (ref 8.9–10.3)
Chloride: 104 mmol/L (ref 98–111)
Creatinine, Ser: 0.86 mg/dL (ref 0.61–1.24)
GFR calc Af Amer: >60 mL/min (ref >60)
Glucose, Bld: 130 mg/dL — ABNORMAL HIGH (ref 70–99)
Potassium: 3.8 mmol/L (ref 3.5–5.1)
Sodium: 138 mmol/L (ref 135–145)
TOTAL PROTEIN: 7.2 g/dL (ref 6.5–8.1)

## 2018-10-28 LAB — CBC
HCT: 51 % (ref 39.0–52.0)
Hemoglobin: 16.2 g/dL (ref 13.0–17.0)
MCH: 29.9 pg (ref 26.0–34.0)
MCHC: 31.8 g/dL (ref 30.0–36.0)
MCV: 94.3 fL (ref 80.0–100.0)
Platelets: 360 10*3/uL (ref 150–400)
RBC: 5.41 MIL/uL (ref 4.22–5.81)
RDW: 13.1 % (ref 11.5–15.5)
WBC: 5.8 10*3/uL (ref 4.0–10.5)
nRBC: 0 % (ref 0.0–0.2)

## 2018-10-28 LAB — LIPASE, BLOOD: LIPASE: 41 U/L (ref 11–51)

## 2018-10-28 LAB — INFLUENZA PANEL BY PCR (TYPE A & B)
Influenza A By PCR: NEGATIVE
Influenza B By PCR: NEGATIVE

## 2018-10-28 MED ORDER — BENZONATATE 100 MG PO CAPS: 100.0000 mg | ORAL_CAPSULE | Freq: Three times a day (TID) | ORAL | 0 refills | Status: DC | PRN

## 2018-10-28 MED ORDER — ALBUTEROL SULFATE HFA 108 (90 BASE) MCG/ACT IN AERS
1.0000 | INHALATION_SPRAY | Freq: Once | RESPIRATORY_TRACT | Status: AC
  Administered 2018-10-28: 2 via RESPIRATORY_TRACT
  Filled 2018-10-28: qty 6.7

## 2018-10-28 MED ORDER — IBUPROFEN 800 MG PO TABS: 800.0000 mg | ORAL_TABLET | Freq: Three times a day (TID) | ORAL | 0 refills | Status: DC

## 2018-10-28 MED ORDER — ONDANSETRON 4 MG PO TBDP: 4.0000 mg | ORAL_TABLET | Freq: Three times a day (TID) | ORAL | 0 refills | Status: DC | PRN

## 2018-10-28 MED ORDER — ONDANSETRON HCL 4 MG/2ML IJ SOLN
4.0000 mg | Freq: Once | INTRAMUSCULAR | Status: AC
  Administered 2018-10-28: 4 mg via INTRAVENOUS
  Filled 2018-10-28: qty 2

## 2018-10-28 MED ORDER — FLUTICASONE PROPIONATE 50 MCG/ACT NA SUSP: 1.0000 | Freq: Every day | NASAL | 0 refills | Status: DC

## 2018-10-28 NOTE — ED Provider Notes (Signed)
[facility] EMERGENCY DEPARTMENT Provider Note   CSN: [id number] Arrival date & time: 10/28/18  5038     History   Chief Complaint Chief Complaint  Patient presents with  . Cough  . Emesis    HPI Andrew [name] is a 53 y.o. male with a hx of bipolar disorder, COPD, T2DM, GERD, HTN, hyperlipidemia, and polysubstance abuse who presents to the ED with complaints of URI x 2-3 weeks and emesis x 3 days. Patient states he had had congestion, ear pressure, sore throat, and productive cough with phlegm sputum. He states over the past few days he has had vomiting. He is unsure how many episodes of emesis, episodes of emesis, however he states he always anytime he eats or drinks something.  He states that with the vomiting he has had some diffuse abdominal soreness that is worse immediately following episode of emesis.  No other specific alleviating or aggravating factors.  Patient states that he has felt subjectively warm without measured temps at home.  Denies dyspnea, wheezing, diarrhea, melena, hematochezia, hematemesis, dysuria, or testicular pain/swelling.    HPI  Past Medical History:  Diagnosis Date  . Anxiety   . Arthritis    right knee  . Back pain    occasionally  . Bipolar disorder ([place])    denies  . COPD (chronic obstructive pulmonary disease) ([place])   . Depression   . Diabetes mellitus without complication ([place])   . Dizziness   . Generalized headaches    history of migraines-last one 02/02/14  . GERD (gastroesophageal reflux disease)    takes Prilosec daily as needed  . GERD (gastroesophageal reflux disease)   . Headache   . Hemorrhoids   . History of bronchitis    1 month ago and treated with Amoxicillin and given an inhaler;takes Prenisone  . Hyperlipidemia    takes Zetia daily  . Hypertension    takes HCTZ daily  . Joint pain   . Joint swelling   . Pneumonia    hx of 2 yrs ago  . Polysubstance abuse ([place])   . Schizoaffective disorder ([place])      denies  . Tuberculosis    treated for 1 year  . Umbilical hernia     Patient Active Problem List   Diagnosis Date Noted  . Crack cocaine overdose ([place]) 09/28/2017  . Hypoglycemia 09/28/2017  . Hepatitis 09/28/2017  . Leucocytosis 09/28/2017  . Overdose 09/28/2017  . Arthritis of knee, degenerative 01/24/2015  . Right knee pain 01/07/2014  . Concern about STD in male without diagnosis 01/06/2014  . Dysuria 12/09/2013  . High cholesterol 10/25/2013  . GERD (gastroesophageal reflux disease) 09/20/2013  . History of positive PPD 07/17/2012  . Cocaine abuse ([place]) 04/02/2012  . Rhabdomyolysis 04/02/2012  . Hernia of abdominal wall 01/05/2011  . ERECTILE DYSFUNCTION, PSYCHOGENIC 02/13/2010  . BIPOLAR DISORDER UNSPECIFIED 01/31/2009  . TOBACCO ABUSE 01/31/2009  . ELEVATED BP READING WITHOUT DX HYPERTENSION 01/31/2009    Past Surgical History:  Procedure Laterality Date  . ESOPHAGOGASTRODUODENOSCOPY    . JOINT REPLACEMENT    . KNEE ARTHROSCOPY WITH MEDIAL MENISECTOMY Right 03/01/2014   Procedure: KNEE ARTHROSCOPY WITH MEDIAL MENISECTOMY;  Surgeon: Meredith Pel, MD;  Location: [facility];  Service: Orthopedics;  Laterality: Right;  . LIPOMA EXCISION  ~ 2010   "back left side of my head"  . PARTIAL KNEE ARTHROPLASTY Right 01/24/2015   Procedure: RIGHT KNEE MEDIAL COMPARTMENT REPLACEMENT VS TOTAL KNEE ARTHROPLASTY.;  Surgeon: Nicki Reaper  Marcene Duos, MD;  Location: [facility];  Service: Orthopedics;  Laterality: Right;  RIGHT KNEE MEDIAL COMPARTMENT REPLACEMENT VS TOTAL KNEE ARTHROPLASTY.  . TOTAL KNEE ARTHROPLASTY Right         Home Medications    Prior to Admission medications   Medication Sig Start Date End Date Taking? Authorizing Provider  atorvastatin (LIPITOR) 10 MG tablet Take 1 tablet (10 mg total) by mouth daily. 09/21/18   Saguier, Percell Miller, PA-C  diclofenac (VOLTAREN) 75 MG EC tablet 1 tab po bid prn ha. 09/10/18   Saguier, Percell Miller, PA-C  gabapentin (NEURONTIN) 100 MG capsule  Take 3 capsules (300 mg total) by mouth at bedtime. 05/05/18   Stover, Titorya, DPM  glucose blood (ONETOUCH VERIO) test strip Use as instructed to check blood sugar three times daily. DX E11.9 09/10/18   Saguier, Percell Miller, PA-C  methocarbamol (ROBAXIN) 500 MG tablet Take 1 tablet (500 mg total) by mouth every 8 (eight) hours as needed. 10/06/18   Hudnall, Sharyn Lull, MD  OLANZapine (ZYPREXA) 5 MG tablet Take 5 mg by mouth at bedtime.    [provider]  [employer] DELICA LANCETS 94R MISC USE TO CHECK BLOOD SUGAR 3 TIMES DAILY. DX E11.9 09/10/18   Saguier, Percell Miller, PA-C  saxagliptin HCl (ONGLYZA) 5 MG TABS tablet Take 1 tablet (5 mg total) by mouth daily. 09/10/18   Saguier, Percell Miller, PA-C  sertraline (ZOLOFT) 50 MG tablet Take 50 mg by mouth daily.    [provider]  tadalafil (CIALIS) 10 MG tablet Take 1 tablet (10 mg total) by mouth daily as needed for erectile dysfunction. 09/24/18   Saguier, Percell Miller, PA-C  traZODone (DESYREL) 50 MG tablet Take 50 mg by mouth at bedtime.    [provider]  Vitamin D, Ergocalciferol, (DRISDOL) 50000 units CAPS capsule Take 1 capsule (50,000 Units total) by mouth every 7 (seven) days. 05/18/18   Copland, Gay Filler, MD    Family History Family History  Problem Relation Age of Onset  . Hypertension Mother   . Cancer Mother   . Heart disease Father   . Heart attack Father 52  . Hyperlipidemia Father   . Hypertension Father   . Diabetes Sister   . Diabetes Brother     Social History Social History   Tobacco Use  . Smoking status: Current Every Day Smoker    Packs/day: 0.25    Years: 30.00    Pack years: 7.50  . Smokeless tobacco: Never Used  Substance Use Topics  . Alcohol use: No    Frequency: Never    Comment: occasionally   . Drug use: Yes    Types: Marijuana, Cocaine    Comment: cocaine, night before last     Allergies   Mushroom extract complex; Penicillins; and Penicillins   Review of Systems Review of Systems    Constitutional: Positive for fever (subjectively warm).  HENT: Positive for congestion, ear pain and sore throat.   Respiratory: Positive for cough. Negative for shortness of breath and wheezing.   Cardiovascular: Negative for chest pain.  Gastrointestinal: Positive for abdominal pain, nausea and vomiting. Negative for diarrhea.  Genitourinary: Negative for dysuria, scrotal swelling and testicular pain.  All other systems reviewed and are negative.    Physical Exam Updated Vital Signs BP 119/78 (BP Location: Right Arm)   Pulse 85   Temp 97.8 F (36.6 C) (Oral)   Resp 16   SpO2 99%   Physical Exam Vitals signs and nursing note reviewed.  Constitutional:  General: He is not in acute distress.    Appearance: He is well-developed. He is not toxic-appearing.  HENT:     Head: Normocephalic and atraumatic.     Right Ear: Tympanic membrane, ear canal and external ear normal.     Left Ear: Tympanic membrane, ear canal and external ear normal.     Nose: Mucosal edema present.     Right Sinus: No maxillary sinus tenderness or frontal sinus tenderness.     Left Sinus: No maxillary sinus tenderness or frontal sinus tenderness.     Mouth/Throat:     Mouth: Mucous membranes are moist.     Pharynx: Oropharynx is clear. No oropharyngeal exudate or posterior oropharyngeal erythema.     Comments: Posterior oropharynx is symmetric appearing. Patient tolerating own secretions without difficulty. No trismus. No drooling. No hot potato voice. No swelling beneath the tongue, submandibular compartment is soft.  Eyes:     General:        Right eye: No discharge.        Left eye: No discharge.     Conjunctiva/sclera: Conjunctivae normal.  Neck:     Musculoskeletal: Neck supple.  Cardiovascular:     Rate and Rhythm: Normal rate and regular rhythm.  Pulmonary:     Effort: Pulmonary effort is normal. No respiratory distress.     Breath sounds: Normal breath sounds. No wheezing, rhonchi or  rales.  Abdominal:     General: There is no distension.     Palpations: Abdomen is soft.     Tenderness: There is generalized abdominal tenderness (mild, non focal). There is no guarding or rebound. Negative signs include Murphy's sign and McBurney's sign.  Skin:    General: Skin is warm and dry.     Findings: No rash.  Neurological:     Mental Status: He is alert.     Comments: Clear speech.   Psychiatric:        Behavior: Behavior normal.     ED Treatments / Results  Labs (all labs ordered are listed, but only abnormal results are displayed) Labs Reviewed  COMPREHENSIVE METABOLIC PANEL - Abnormal; Notable for the following components:      Result Value   Glucose, Bld 130 (*)    All other components within normal limits  CBC  LIPASE, BLOOD  INFLUENZA PANEL BY PCR (TYPE A & B)    EKG None  Radiology Dg Abd Acute W/chest  Result Date: 10/28/2018 CLINICAL DATA:  Shortness of breath with nausea and vomiting EXAM: DG ABDOMEN ACUTE W/ 1V CHEST COMPARISON:  Chest radiograph October 09, 2017; CT abdomen and pelvis November 26, 2014 FINDINGS: PA chest: There is bullous disease with scarring in the upper lobes, somewhat more notable on the right than on the left. There is no edema or consolidation. Heart size and pulmonary vascularity are within normal limits and stable. No evident adenopathy. Supine and upright abdomen: There is moderate stool in the colon. There is no bowel dilatation or air-fluid level to suggest bowel obstruction. No free air. There are small apparent phleboliths in the pelvis. IMPRESSION: No bowel obstruction or free air.  Moderate stool in colon. 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 No edema or consolidation. Stable cardiac silhouette. Electronically Signed   By: Lowella Grip III M.D.   On: 10/28/2018 10:08    Procedures Procedures (including critical care time)  Medications Ordered in ED Medications  albuterol (PROVENTIL HFA;VENTOLIN  HFA) 108 (90 Base) MCG/ACT inhaler 1-2  puff (has no administration in time range)  ondansetron (ZOFRAN) injection 4 mg (4 mg Intravenous Given 10/28/18 1015)     Initial Impression / Assessment and Plan / ED Course  I have reviewed the triage vital signs and the nursing notes.  Pertinent labs & imaging results that were available during my care of the patient were reviewed by me and considered in my medical decision making (see chart for details).   Patient presents to the ED with URI sxs x 2-3 weeks and N/V & abdominal discomfort since yesterday. Nontoxic appearing, in no apparent distress, vitals WNL. Exam with some nasal congestion/mucoal edema and mild generalized abdominal tenderness without peritoneal signs. Further evaluation with labs & acute chest/abdomen x-ray.  Work-up reviewed:  CBC: Unremarkable. No leukocytosis or anemia.  CMP: Hyperglycemia to 130, no acidosis/gap to suggest DKA. Electrolytes, LFTs, and renal function WNL.  Lipase: WNL.  Influenza testing: negative X-ray: Stable chronic findings. No acute pneumonia, effusion, edema, or PTX. No bowel obstruction or free air, there is moderate stool in the colon.   On re-assessment patient is tolerating PO and feeling better, requesting dischage. Repeat abdominal exam remains non peritoneal- doubt obstruction, perforation, appendicitis, diverticulitis, pancreatitis, or cholecystitis. No urinary sxs in male, doubt UTI. No pneumonia on CXR. No wheezing on lung exam to suggest COPD exacerbation, patient provided inhaler in the ER as he has run out at home. Afebrile without sinus tenderness, doubt acute bacterial sinsuitis. Centor score 0- doubt strep, No AOM, AOE, mastoiditis on exam. No meningismus. Suspect overall viral process. Discharge home with supportive measures and PCP follow up. I discussed results, treatment plan, need for follow-up, and return precautions with the patient. Provided opportunity for questions, patient confirmed  understanding and is in agreement with plan.    Final Clinical Impressions(s) / ED Diagnoses   Final diagnoses:  Viral illness    ED Discharge Orders         Ordered    ibuprofen (ADVIL,MOTRIN) 800 MG tablet  3 times daily     10/28/18 1240    fluticasone (FLONASE) 50 MCG/ACT nasal spray  Daily     10/28/18 1240    benzonatate (TESSALON) 100 MG capsule  3 times daily PRN     10/28/18 1240    ondansetron (ZOFRAN ODT) 4 MG disintegrating tablet  Every 8 hours PRN     10/28/18 1240           Ta Fair, [place], PA-C 10/28/18 1249    Duffy Bruce, MD 10/29/18 [phone]

## 2018-10-28 NOTE — ED Triage Notes (Signed)
Pt states he has been vomiting for 3 days. Pt has had a cough for 2-3 weeks; coughing up tan mucous. Pt complains of nasal congestion as well and sore throat.

## 2018-10-28 NOTE — Discharge Instructions (Addendum)
You were seen in the emergency today for upper respiratory symptoms, we suspect your symptoms are related to a virus at this time. Your lab work was reassuring and your xray did not show pneumonia. We have prescribed you multiple medications to treat your symptoms.   -Flonase to be used 1 spray in each nostril daily.  This medication is used to treat your congestion.  -Tessalon can be taken once every 8 hours as needed.  This medication is used to treat your cough.  -Ibuprofen to be taken once every 8 hours as needed for pain. Please take this medicine with food as it can cause stomach upset and at worst stomach bleeding. Do not take other NSAIDs such as motrin, aleve, advil, naproxen, mobic, etc as they are similar. You make take tylenol per over the counter dosing with this medicine safely.  - Zofran- to be taken once every 8 hours as needed for nausea/vomiting  - Inhaler to use as needed for trouble breathing/wheezing every 4-6 hours.   We have prescribed you new medication(s) today. Discuss the medications prescribed today with your pharmacist as they can have adverse effects and interactions with your other medicines including over the counter and prescribed medications. Seek medical evaluation if you start to experience new or abnormal symptoms after taking one of these medicines, seek care immediately if you start to experience difficulty breathing, feeling of your throat closing, facial swelling, or rash as these could be indications of a more serious allergic reaction  You will need to follow-up with your primary care provider in 3-5 days if your symptoms have not improved.  If you do not have a primary care provider one is provided in your discharge instructions.  Return to the emergency department for any new or worsening symptoms including but not limited to persistent fever for 5 days, difficulty breathing, chest pain, rashes, passing out, or any other concerns.

## 2018-11-16 ENCOUNTER — Encounter: Payer: Self-pay | Admitting: Family Medicine

## 2018-11-16 ENCOUNTER — Ambulatory Visit (INDEPENDENT_AMBULATORY_CARE_PROVIDER_SITE_OTHER): Payer: Medicare Other | Admitting: Family Medicine

## 2018-11-16 VITALS — BP 114/80 | HR 63 | Ht 68.0 in | Wt 220.0 lb

## 2018-11-16 DIAGNOSIS — M542 Cervicalgia: Secondary | ICD-10-CM | POA: Diagnosis not present

## 2018-11-16 NOTE — Progress Notes (Signed)
PCP and consultation requested by: [name], PA-C  Subjective:   HPI: Patient is a 53 y.o. male here for neck pain.  12/31: Patient reports for about 2-3 months he's had off and on pain posterior neck. Associated headaches with this as well. Pain up to 7/10 and sharp. Worse with anxiety, stress, if he gets mad. Has tried diclofenac and flexeril but without much benefit. No radiation into extremities. No numbness, tingling. No bowel/bladder dysfunction.  2/10: Patient returns for neck pain.  He reports continued 8/10 mostly anterior and lateral neck pain.. Taking diclofenac which helps minimally. States he was doing home exercises come but is unable to readily demonstrate what exercises he was doing. He denies any new injuries.  No distal numbness or tingling.  No radiation of his pain. Today he does describe some intermittent difficulty with swallowing.  He is unsure how long this has been occurring.  Past Medical History:  Diagnosis Date  . Anxiety   . Arthritis    right knee  . Back pain    occasionally  . Bipolar disorder ([place])    denies  . COPD (chronic obstructive pulmonary disease) ([place])   . Depression   . Diabetes mellitus without complication ([place])   . Dizziness   . Generalized headaches    history of migraines-last one 02/02/14  . GERD (gastroesophageal reflux disease)    takes Prilosec daily as needed  . GERD (gastroesophageal reflux disease)   . Headache   . Hemorrhoids   . History of bronchitis    1 month ago and treated with Amoxicillin and given an inhaler;takes Prenisone  . Hyperlipidemia    takes Zetia daily  . Hypertension    takes HCTZ daily  . Joint pain   . Joint swelling   . Pneumonia    hx of 2 yrs ago  . Polysubstance abuse ([place])   . Schizoaffective disorder ([place])    denies  . Tuberculosis    treated for 1 year  . Umbilical hernia     Current Outpatient Medications on File Prior to Visit  Medication Sig Dispense Refill  .  atorvastatin (LIPITOR) 10 MG tablet Take 1 tablet (10 mg total) by mouth daily. 30 tablet 3  . benzonatate (TESSALON) 100 MG capsule Take 1 capsule (100 mg total) by mouth 3 (three) times daily as needed for cough. 21 capsule 0  . diclofenac (VOLTAREN) 75 MG EC tablet 1 tab po bid prn ha. 20 tablet 0  . fluticasone (FLONASE) 50 MCG/ACT nasal spray Place 1 spray into both nostrils daily. 16 g 0  . gabapentin (NEURONTIN) 100 MG capsule Take 3 capsules (300 mg total) by mouth at bedtime. 30 capsule 0  . glucose blood (ONETOUCH VERIO) test strip Use as instructed to check blood sugar three times daily. DX E11.9 100 each 5  . ibuprofen (ADVIL,MOTRIN) 800 MG tablet Take 1 tablet (800 mg total) by mouth 3 (three) times daily. 21 tablet 0  . methocarbamol (ROBAXIN) 500 MG tablet Take 1 tablet (500 mg total) by mouth every 8 (eight) hours as needed. 60 tablet 1  . OLANZapine (ZYPREXA) 5 MG tablet Take 5 mg by mouth at bedtime.    . ondansetron (ZOFRAN ODT) 4 MG disintegrating tablet Take 1 tablet (4 mg total) by mouth every 8 (eight) hours as needed for nausea or vomiting. 5 tablet 0  . ONETOUCH DELICA LANCETS 86V MISC USE TO CHECK BLOOD SUGAR 3 TIMES DAILY. DX E11.9 100 each 3  .  saxagliptin HCl (ONGLYZA) 5 MG TABS tablet Take 1 tablet (5 mg total) by mouth daily. 30 tablet 3  . sertraline (ZOLOFT) 50 MG tablet Take 50 mg by mouth daily.    . tadalafil (CIALIS) 10 MG tablet Take 1 tablet (10 mg total) by mouth daily as needed for erectile dysfunction. 10 tablet 0  . traZODone (DESYREL) 50 MG tablet Take 50 mg by mouth at bedtime.    . Vitamin D, Ergocalciferol, (DRISDOL) 50000 units CAPS capsule Take 1 capsule (50,000 Units total) by mouth every 7 (seven) days. 8 capsule 0   No current facility-administered medications on file prior to visit.     Past Surgical History:  Procedure Laterality Date  . ESOPHAGOGASTRODUODENOSCOPY    . JOINT REPLACEMENT    . KNEE ARTHROSCOPY WITH MEDIAL MENISECTOMY Right  03/01/2014   Procedure: KNEE ARTHROSCOPY WITH MEDIAL MENISECTOMY;  Surgeon: Meredith Pel, MD;  Location: [facility];  Service: Orthopedics;  Laterality: Right;  . LIPOMA EXCISION  ~ 2010   "back left side of my head"  . PARTIAL KNEE ARTHROPLASTY Right 01/24/2015   Procedure: RIGHT KNEE MEDIAL COMPARTMENT REPLACEMENT VS TOTAL KNEE ARTHROPLASTY.;  Surgeon: Meredith Pel, MD;  Location: [facility];  Service: Orthopedics;  Laterality: Right;  RIGHT KNEE MEDIAL COMPARTMENT REPLACEMENT VS TOTAL KNEE ARTHROPLASTY.  . TOTAL KNEE ARTHROPLASTY Right     Allergies  Allergen Reactions  . Mushroom Extract Complex Hives  . Penicillins Hives  . Penicillins Hives    Has patient had a PCN reaction causing immediate rash, facial/tongue/throat swelling, SOB or lightheadedness with hypotension: no Has patient had a PCN reaction causing severe rash involving mucus membranes or skin necrosis: no Has patient had a PCN reaction that required hospitalization: no Has patient had a PCN reaction occurring within the last 10 years: yes If all of the above answers are "NO", then may proceed with Cephalosporin use.    Social History   Socioeconomic History  . Marital status: Single    Spouse name: Not on file  . Number of children: Not on file  . Years of education: Not on file  . Highest education level: Not on file  Occupational History  . Occupation: lifts and pulls concrete    Employer: Man   Social Needs  . Financial resource strain: Not on file  . Food insecurity:    Worry: Not on file    Inability: Not on file  . Transportation needs:    Medical: Not on file    Non-medical: Not on file  Tobacco Use  . Smoking status: Current Every Day Smoker    Packs/day: 0.25    Years: 30.00    Pack years: 7.50  . Smokeless tobacco: Never Used  Substance and Sexual Activity  . Alcohol use: No    Frequency: Never    Comment: occasionally   . Drug use: Yes    Types: Marijuana, Cocaine    Comment:  cocaine, night before last  . Sexual activity: Yes  Lifestyle  . Physical activity:    Days per week: Not on file    Minutes per session: Not on file  . Stress: Not on file  Relationships  . Social connections:    Talks on phone: Not on file    Gets together: Not on file    Attends religious service: Not on file    Active member of club or organization: Not on file    Attends meetings of clubs or organizations: Not  on file    Relationship status: Not on file  . Intimate partner violence:    Fear of current or ex partner: Not on file    Emotionally abused: Not on file    Physically abused: Not on file    Forced sexual activity: Not on file  Other Topics Concern  . Not on file  Social History Narrative   ** Merged History Encounter **       Lives with girlfriend. Work involves a lot of pulling and lifting concrete, was not definite about type of work. Denies use of illegal drugs. Smokes half pack of cigarettes daily.    Family History  Problem Relation Age of Onset  . Hypertension Mother   . Cancer Mother   . Heart disease Father   . Heart attack Father 86  . Hyperlipidemia Father   . Hypertension Father   . Diabetes Sister   . Diabetes Brother     BP 114/80   Pulse 63   Ht 5\' 8"  (1.727 m)   Wt 220 lb (99.8 kg)   BMI 33.45 kg/m   Review of Systems: See HPI above.     Objective:  Physical Exam:  GEN: Awake, alert, no acute distress Pulmonary: Breathing unlabored  Cervical spine: No obvious deformity or soft tissue swelling Tenderness to palpation diffusely over the lateral paraspinals as well as midline. Slightly limited neck extension, overall his range of motion is very well-preserved 5/5 strength in the bilateral upper extremities N/V intact bilateral upper extremities   Assessment & Plan:  1.  Neck pain- overall no improvement from previous visit.  Question patient compliance with basic home exercises. - Refer to physical therapy - Continue  diclofenac as needed. - Tylenol as needed - Recommend he discuss GI referral with his PCP.  Although it is possible that his dysphagia is due to his hyperostosis, recommend initial GI work-up. - Follow-up in 6 weeks

## 2018-11-16 NOTE — Patient Instructions (Signed)
You have skeletal hyperostosis with cervical spasms. Take the diclofenac twice a day with food as needed for pain and inflammation. Tylenol 500mg  1-2 tabs three times a day as needed for pain. Topical aspercreme up to 4 times a day, salon pas patches may be helpful also. Some supplements that might help:  Boswellia extract, curcumin, pycnogenol. Start physical therapy and do home exercises on days you don't go to therapy. Follow up with me in 5-6 weeks.

## 2018-11-19 ENCOUNTER — Telehealth: Payer: Self-pay

## 2018-11-19 MED ORDER — SITAGLIPTIN PHOSPHATE 25 MG PO TABS: 25.0000 mg | ORAL_TABLET | Freq: Every day | ORAL | 3 refills | Status: DC

## 2018-11-19 NOTE — Telephone Encounter (Signed)
Januvia prescription sent to pt pharmacy.Notify pt. Explain to pt we have no idea when insurance changes what they cover and don't know if Celesta Gentile is covered. Will have to wait and see how they process it.

## 2018-11-19 NOTE — Telephone Encounter (Signed)
Patient called here "angry" as pec states that the pharmacy does not have is Onglyza. I checked and on 09/18/18 we sent in 30 tablets with 3 refills. I called pharmacy they stated they hold him several times that insurance is no longer covering this medication. Please send in alternative drug.

## 2018-11-23 ENCOUNTER — Other Ambulatory Visit: Payer: Self-pay

## 2018-11-23 ENCOUNTER — Ambulatory Visit: Payer: Medicare Other | Attending: Family Medicine | Admitting: Physical Therapy

## 2018-11-23 ENCOUNTER — Encounter: Payer: Self-pay | Admitting: Physical Therapy

## 2018-11-23 DIAGNOSIS — M542 Cervicalgia: Secondary | ICD-10-CM | POA: Diagnosis not present

## 2018-11-23 DIAGNOSIS — R29898 Other symptoms and signs involving the musculoskeletal system: Secondary | ICD-10-CM | POA: Insufficient documentation

## 2018-11-23 DIAGNOSIS — M62838 Other muscle spasm: Secondary | ICD-10-CM | POA: Diagnosis not present

## 2018-11-23 NOTE — Therapy (Addendum)
[facility] [address]  [place], [place], [address] Phone: [phone]   Fax:  [phone]  Physical Therapy Evaluation  Patient Details  Name: Andrew [name] MRN: [id number] Date of Birth: 02/20/1966 Referring Provider (PT): Karlton Lemon, MD  Progress Note Reporting Period 11/23/18 to 11/23/18  See note below for Objective Data and Assessment of Progress/Goals.    Encounter Date: 11/23/2018  PT End of Session - 11/23/18 0904    Visit Number  1    Number of Visits  5    Date for PT Re-Evaluation  12/21/18    Authorization Type  Medicare & Medicaid    PT Start Time  [phone]   patient late   PT Stop Time  0855    PT Time Calculation (min)  39 min    Activity Tolerance  Patient tolerated treatment well    Behavior During Therapy  [facility] for tasks assessed/performed       Past Medical History:  Diagnosis Date  . Anxiety   . Arthritis    right knee  . Back pain    occasionally  . Bipolar disorder ([place])    denies  . COPD (chronic obstructive pulmonary disease) ([place])   . Depression   . Diabetes mellitus without complication ([place])   . Dizziness   . Generalized headaches    history of migraines-last one 02/02/14  . GERD (gastroesophageal reflux disease)    takes Prilosec daily as needed  . GERD (gastroesophageal reflux disease)   . Headache   . Hemorrhoids   . History of bronchitis    1 month ago and treated with Amoxicillin and given an inhaler;takes Prenisone  . Hyperlipidemia    takes Zetia daily  . Hypertension    takes HCTZ daily  . Joint pain   . Joint swelling   . Pneumonia    hx of 2 yrs ago  . Polysubstance abuse ([place])   . Schizoaffective disorder ([place])    denies  . Tuberculosis    treated for 1 year  . Umbilical hernia     Past Surgical History:  Procedure Laterality Date  . ESOPHAGOGASTRODUODENOSCOPY    . JOINT REPLACEMENT    . KNEE ARTHROSCOPY WITH MEDIAL MENISECTOMY Right  03/01/2014   Procedure: KNEE ARTHROSCOPY WITH MEDIAL MENISECTOMY;  Surgeon: Meredith Pel, MD;  Location: [facility];  Service: Orthopedics;  Laterality: Right;  . LIPOMA EXCISION  ~ 2010   "back left side of my head"  . PARTIAL KNEE ARTHROPLASTY Right 01/24/2015   Procedure: RIGHT KNEE MEDIAL COMPARTMENT REPLACEMENT VS TOTAL KNEE ARTHROPLASTY.;  Surgeon: Meredith Pel, MD;  Location: [facility];  Service: Orthopedics;  Laterality: Right;  RIGHT KNEE MEDIAL COMPARTMENT REPLACEMENT VS TOTAL KNEE ARTHROPLASTY.  . TOTAL KNEE ARTHROPLASTY Right     There were no vitals filed for this visit.   Subjective Assessment - 11/23/18 0818    Subjective  Patient reports neck pain for the past few months. Denies any activity changes or accidents that could have caused onset. Feels like a migraine. Located over midline of lower cervical spine, also notes stiffness in B shoulders. Worse with movement. Intermittent N/T in neck and down to R arm. Best with sidelying. Also reports onset of "freezing sensation" around the same time in the R antecubital fossa. No easing/aggravating factors. In the process of getting a GI consult for dysphagia that may be related to his neck pain.     Pertinent History  hx of TB- treated, schizoaffective disorder, substance abuse, HTN, HLD, HA, GERD, dizziness, DM, COPD, bipolar disorder, back pain, R TKA    Limitations  Reading;House hold activities;Writing;Lifting    Diagnostic tests  09/10/18 cervical xray: Progressive massive anterior osteophyte formation from C3-C7. This can be associated with neck pain or dysphagia and could indicate diffuse idiopathic skeletal hyperostosis.    Patient Stated Goals  "i want my neck to feel better"    Currently in Pain?  Yes    Pain Score  6     Pain Location  Neck    Pain Orientation  Lower;Mid    Pain Descriptors / Indicators  Aching    Pain Type  Chronic pain         [facility] PT Assessment - 11/23/18 0829      Assessment   Medical  Diagnosis  Neck Pain    Referring Provider (PT)  Karlton Lemon, MD    Onset Date/Surgical Date  08/23/18    Hand Dominance  Right;Left    Next MD Visit  Not scheduled    Prior Therapy  Yes      Precautions   Precautions  None      Balance Screen   Has the patient fallen in the past 6 months  No    Has the patient had a decrease in activity level because of a fear of falling?   No    Is the patient reluctant to leave their home because of a fear of falling?   No      Prior Function   Level of Independence  Independent    Vocation  Self employed    Vocation Requirements  cutting trees- lifting, climbing, looking overhead      Cognition   Overall Cognitive Status  Within Functional Limits for tasks assessed      Observation/Other Assessments   Focus on Therapeutic Outcomes (FOTO)   next session      Sensation   Light Touch  Appears Intact      Coordination   Gross Motor Movements are Fluid and Coordinated  Yes      Posture/Postural Control   Posture/Postural Control  Postural limitations    Postural Limitations  Rounded Shoulders;Forward head      ROM / Strength   AROM / PROM / Strength  AROM;Strength      AROM   AROM Assessment Site  Cervical;Shoulder    Right/Left Shoulder  --   B shoulder AROM grossly WFL   Cervical Flexion  55    Cervical Extension  32   pt c/o cracking and pain   Cervical - Right Side Bend  31    Cervical - Left Side Bend  26    Cervical - Right Rotation  mildly limited    Cervical - Left Rotation  [facility]      Strength   Strength Assessment Site  Shoulder;Elbow;Forearm;Wrist    Right/Left Shoulder  Right;Left    Right Shoulder Flexion  4+/5   pain in bicep   Right Shoulder ABduction  4+/5   pain in bicep   Right Shoulder Internal Rotation  4/5    Right Shoulder External Rotation  4/5    Left Shoulder Flexion  4+/5    Left Shoulder ABduction  4+/5    Left Shoulder Internal Rotation  4+/5    Left Shoulder External Rotation  4+/5     Right/Left Elbow  Right;Left    Right Elbow Flexion  5/5  Right Elbow Extension  5/5    Left Elbow Flexion  5/5    Left Elbow Extension  5/5    Right/Left Forearm  Right;Left    Right Forearm Pronation  4+/5    Right Forearm Supination  4+/5    Left Forearm Pronation  4+/5    Left Forearm Supination  4+/5      Flexibility   Soft Tissue Assessment /Muscle Length  yes      Palpation   Palpation comment  TTP in B LS, rhomboids, suboccipitals, R proximal biceps tendon and medial muscle belly      Special Tests    Special Tests  Cervical    Cervical Tests  Spurling's;Dictraction      Spurling's   Findings  Positive    Side  --   positive B   Comment  positive for pain      Distraction Test   Findngs  Negative    Comment  c/o pain in posterior neck                Objective measurements completed on examination: See above findings.              PT Education - 11/23/18 0903    Education Details  prognosis, POC, HEP, edu on arthritis and its influence on pain and function     Person(s) Educated  Patient    Methods  Explanation;Demonstration;Tactile cues;Verbal cues;Handout    Comprehension  Verbalized understanding;Returned demonstration       PT Short Term Goals - 11/23/18 0908      PT SHORT TERM GOAL #1   Title  Patient to be independent with initial HEP.    Time  2    Period  Weeks    Status  New    Target Date  12/07/18        PT Long Term Goals - 11/23/18 0908      PT LONG TERM GOAL #1   Title  Patient to be independent with advanced HEP.    Time  4    Period  Weeks    Status  New    Target Date  12/21/18      PT LONG TERM GOAL #2   Title  Patient to demonstrate cervical AROM WFL and without pain limiting.     Time  4    Period  Weeks    Status  New    Target Date  12/21/18      PT LONG TERM GOAL #3   Title  Patient to demonstrate B shoulder strength 5/5 without pain limiting.     Time  4    Period  Weeks    Status  New     Target Date  12/21/18      PT LONG TERM GOAL #4   Title  Patient to report 50% improvement in overall neck pain.     Time  4    Period  Weeks    Status  New    Target Date  12/21/18             Plan - 11/23/18 0768    Clinical Impression Statement  Patient is a 53y/o M presenting to OPPT with c/o posterior neck pain of a couple months duration with insidious onset. Patient reporting baseline level of aching pain with intermittent sharp pain described as "migraine" with cervical movement. Also notes intermittent N/T in neck and down to R arm, with pain in antecubital  fossa. Reports that he is in the process of getting a GI consult for dysphagia that may be related to his neck pain. Patient today with limited and painful cervical AROM, pain with R shoulder MMT, TTP in B LS, rhomboids, suboccipitals, R proximal biceps tendon and medial muscle belly, and positive Spurling's on B sides. Educated on gentle stretching and postural correction HEP; reported understanding. Would benefit from skilled PT services 1x/week for 4 weeks to address aforementioned impairments.     Clinical Presentation  Stable    Clinical Decision Making  Low    Rehab Potential  Good    Clinical Impairments Affecting Rehab Potential  hx of TB- treated, schizoaffective disorder, substance abuse, HTN, HLD, HA, GERD, dizziness, DM, COPD, bipolar disorder, back pain, R TKA    PT Frequency  1x / week    PT Duration  4 weeks    PT Treatment/Interventions  ADLs/Self Care Home Management;Cryotherapy;Electrical Stimulation;Functional mobility training;Ultrasound;Traction;Moist Heat;Therapeutic activities;Therapeutic exercise;Neuromuscular re-education;Patient/family education;Passive range of motion;Manual techniques;Dry needling;Energy conservation;Splinting;Taping;Vasopneumatic Device    PT Next Visit Plan  reassess HEP    Consulted and Agree with Plan of Care  Patient       Patient will benefit from skilled therapeutic  intervention in order to improve the following deficits and impairments:  Hypomobility, Decreased activity tolerance, Decreased strength, Increased fascial restricitons, Impaired UE functional use, Pain, Increased muscle spasms, Decreased range of motion, Postural dysfunction, Impaired flexibility  Visit Diagnosis: Cervicalgia  Other symptoms and signs involving the musculoskeletal system  Other muscle spasm     Problem List Patient Active Problem List   Diagnosis Date Noted  . Crack cocaine overdose ([place]) 09/28/2017  . Hypoglycemia 09/28/2017  . Hepatitis 09/28/2017  . Leucocytosis 09/28/2017  . Overdose 09/28/2017  . Arthritis of knee, degenerative 01/24/2015  . Right knee pain 01/07/2014  . Concern about STD in male without diagnosis 01/06/2014  . Dysuria 12/09/2013  . High cholesterol 10/25/2013  . GERD (gastroesophageal reflux disease) 09/20/2013  . History of positive PPD 07/17/2012  . Cocaine abuse ([place]) 04/02/2012  . Rhabdomyolysis 04/02/2012  . Hernia of abdominal wall 01/05/2011  . ERECTILE DYSFUNCTION, PSYCHOGENIC 02/13/2010  . BIPOLAR DISORDER UNSPECIFIED 01/31/2009  . TOBACCO ABUSE 01/31/2009  . ELEVATED BP READING WITHOUT DX HYPERTENSION 01/31/2009    Janene Harvey, PT, DPT 11/23/18 9:12 AM   [facility] [address]  [place], [place], [address] Phone: [phone]   Fax:  [phone]  Name: Andrew [name] MRN: [id number] Date of Birth: [birth year]   PHYSICAL THERAPY DISCHARGE SUMMARY  Visits from Start of Care: 1  Current functional level related to goals / functional outcomes: Unable to assess; patient did not return for full session after evaluation   Remaining deficits: Unable to assess   Education / Equipment: HEP  Plan: Patient agrees to discharge.  Patient goals were not met. Patient is being discharged due to not returning since the last visit.  ?????      Janene Harvey, PT, DPT 01/11/19 2:38 PM

## 2018-11-23 NOTE — Telephone Encounter (Signed)
Mychart message sent to patient.

## 2018-12-03 ENCOUNTER — Ambulatory Visit: Payer: Self-pay

## 2018-12-03 NOTE — Telephone Encounter (Signed)
Patient called and says he was started on Januvia 1.5 weeks ago and since he's been taking it, he developed a rash, is having chest tightness, SOB, and lightheaded. He says when he tries to smoke, he coughs. He says all of those symptoms have been since he started taking the Januvia. He also mentioned another provider put him on Robaxin for neck stiffness and says he has swelling to the neck from the Robaxin. He says he started taking them about the same time and he's going to call the other provider about the Robaxin. He denies difficulty swallowing, no fever, no cold symptoms. I asked is he able to come into the office to see another provider today, he says that he is going to take a rest day today, because he didn't sleep last night and he will come tomorrow, but only wants to see [employer]. I opened the appointments and advised him of the already scheduled appointment for tomorrow at 1020 with [name], [facility] for a follow up and to be there at 1005. Care advice given. I asked what is his blood sugars running on Januvia, he says last night it was over 400 and he started to go to the ED, but decided to wait it out. He says he drank several bottles of water and this morning the blood sugar was in the 100's. I advised to go to the ED if his symptoms get worse, he verbalized understanding.  Reason for Disposition . Taking new prescription medicine  (Exceptions: finished taking new prescription antibiotic OR questions about flushing from niacin)  Answer Assessment - Initial Assessment Questions 1. SYMPTOMS: "Do you have any symptoms?"     Chest tightness, dizziness, rash, shortness of breath 2. SEVERITY: If symptoms are present, ask "Are they mild, moderate or severe?"      Rash is severe  Answer Assessment - Initial Assessment Questions 1. APPEARANCE of RASH: "Describe the rash." (e.g., spots, blisters, raised areas, skin peeling, scaly)     Red, rash 2. SIZE: "How big are the spots?" (e.g.,  tip of pen, eraser, coin; inches, centimeters)     N/A 3. LOCATION: "Where is the rash located?"     My back is all scratched up, it's everywhere 4. COLOR: "What color is the rash?" (Note: It is difficult to assess rash color in people with darker-colored skin. When this situation occurs, simply ask the caller to describe what they see.)     N/A 5. ONSET: "When did the rash begin?"     1.5 weeks ago after taking Januvia 6. FEVER: "Do you have a fever?" If so, ask: "What is your temperature, how was it measured, and when did it start?"     No 7. ITCHING: "Does the rash itch?" If so, ask: "How bad is the itch?" (Scale 1-10; or mild, moderate, severe)     Severe 8. CAUSE: "What do you think is causing the rash?"     Januvia and it may be Robaxin too 9. NEW MEDICATION: "What new medication are you taking?" (e.g., name of antibiotic) "When did you start taking this medication?".     Januvia 1.5 weeks ago 10. OTHER SYMPTOMS: "Do you have any other symptoms?" (e.g., sore throat, fever, joint pain)       Chest tightness, SOB, lightheaded 11. PREGNANCY: "Is there any chance you are pregnant?" "When was your last menstrual period?"       N/A  Protocols used: RASH - WIDESPREAD ON DRUGS-A-AH, MEDICATION QUESTION CALL-A-AH

## 2018-12-04 ENCOUNTER — Ambulatory Visit: Payer: Medicare Other | Admitting: Physical Therapy

## 2018-12-04 ENCOUNTER — Telehealth: Payer: Self-pay | Admitting: *Deleted

## 2018-12-04 ENCOUNTER — Ambulatory Visit (INDEPENDENT_AMBULATORY_CARE_PROVIDER_SITE_OTHER): Payer: Medicare Other | Admitting: Medical

## 2018-12-04 ENCOUNTER — Encounter: Payer: Self-pay | Admitting: Medical

## 2018-12-04 ENCOUNTER — Telehealth: Payer: Self-pay

## 2018-12-04 ENCOUNTER — Encounter: Payer: Self-pay | Admitting: Physical Therapy

## 2018-12-04 VITALS — BP 132/82 | HR 72

## 2018-12-04 VITALS — BP 121/80 | HR 58 | Temp 98.4°F | Resp 16 | Ht 68.0 in | Wt 220.8 lb

## 2018-12-04 DIAGNOSIS — R29898 Other symptoms and signs involving the musculoskeletal system: Secondary | ICD-10-CM

## 2018-12-04 DIAGNOSIS — E119 Type 2 diabetes mellitus without complications: Secondary | ICD-10-CM | POA: Diagnosis not present

## 2018-12-04 DIAGNOSIS — M542 Cervicalgia: Secondary | ICD-10-CM

## 2018-12-04 DIAGNOSIS — F319 Bipolar disorder, unspecified: Secondary | ICD-10-CM

## 2018-12-04 DIAGNOSIS — R109 Unspecified abdominal pain: Secondary | ICD-10-CM

## 2018-12-04 DIAGNOSIS — M62838 Other muscle spasm: Secondary | ICD-10-CM

## 2018-12-04 LAB — COMPREHENSIVE METABOLIC PANEL
ALT: 21 U/L (ref 0–53)
AST: 21 U/L (ref 0–37)
Albumin: 4.4 g/dL (ref 3.5–5.2)
Alkaline Phosphatase: 93 U/L (ref 39–117)
BILIRUBIN TOTAL: 0.5 mg/dL (ref 0.2–1.2)
BUN: 16 mg/dL (ref 6–23)
CO2: 29 mEq/L (ref 19–32)
Calcium: 9.9 mg/dL (ref 8.4–10.5)
Chloride: 103 mEq/L (ref 96–112)
Creatinine, Ser: 0.97 mg/dL (ref 0.40–1.50)
GFR: 97.86 mL/min (ref >60.00)
Glucose, Bld: 77 mg/dL (ref 70–99)
Potassium: 4.7 mEq/L (ref 3.5–5.1)
Sodium: 138 mEq/L (ref 135–145)
Total Protein: 7.3 g/dL (ref 6.0–8.3)

## 2018-12-04 LAB — AMYLASE: Amylase: 49 U/L (ref 27–131)

## 2018-12-04 LAB — GLUCOSE, POCT (MANUAL RESULT ENTRY): POC Glucose: 134 mg/dl — AB (ref 70–99)

## 2018-12-04 LAB — LIPASE: Lipase: 32 U/L (ref 11.0–59.0)

## 2018-12-04 MED ORDER — TIZANIDINE HCL 4 MG PO TABS: ORAL_TABLET | ORAL | 0 refills | Status: DC

## 2018-12-04 MED ORDER — SAXAGLIPTIN HCL 5 MG PO TABS: 5.0000 mg | ORAL_TABLET | Freq: Every day | ORAL | 0 refills | Status: DC

## 2018-12-04 NOTE — Telephone Encounter (Signed)
Patient was at the front desk and heard me speaking to coworker and said "your voice sounds real professional, I mean REAL professional. If you ever get tired of this job give me a call. You have a different type of voice." I said "I know what you're talking about and that is inappropriate"

## 2018-12-04 NOTE — Therapy (Signed)
[facility] [address]  [place], [place], [address] Phone: [phone]   Fax:  [phone]  Patient Details  Name: Andrew [name] MRN: [id number] Date of Birth: Apr 17, 1966 Referring Provider:  Dene Gentry, MD  Encounter Date: 12/04/2018  Vitals:   12/04/18 0813  BP: 132/82  Pulse: 72  SpO2: 98%    Patient arrived late to session reporting the following symptoms: "swelling in my neck," gagging, SOB, falling asleep at the wheel. Report that he believes this is d/t Robaxin, however has not taken these meds in 4 days. Also reports that he believes Januvia has been causing intense itching in his back and nausea. Last blood glucose reading was 455. Reporting that he will be seeing his PCP this AM. Vitals WFL and patient not currently in distress. Declined treatment today.   Janene Harvey, PT, DPT 12/04/18 8:33 AM   [facility] [place], [place], [address] Phone: [phone]   Fax:  [phone]

## 2018-12-04 NOTE — Progress Notes (Signed)
Subjective:    Patient ID: Andrew [name], male    DOB: [birth year], 53 y.o.   MRN: [id number]  HPI  Pt in for follow up.  Pt give me update and states that he is upset that his insurance would not pay for med onglyza. I had to switch him to Tong[place]. Pt states when he started Tonga and by second day his back was itching. He stopped medication and itching stopped. He states also had dizziness night he was itching. He states last Friday is when he took foods and had side effects of up stomach hurt also. His sugar was over 400 that day.   Pt in past  initially did very well with metformin. His a1c came down to 6.3 two months ago. But could not tolerate gi side effects of metformin. So I switched him to onlgyza he states his sugars 80-100. Then insurance declined his onglyza and stated it was not covered.   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 This can be associated with neck pain or dysphagia and could indicate diffuse idiopathic skeletal hyperostosis.   No neck stiffness but rt side trapezius tenderness. Robaxin not helping per pt.  Sugar today in office 134.  Pt mentioned some pain rt side of his back yesterday hurt to move yesterday but no pain presently.  Rt trapezius pain over past 3 days. He states muscle relaxant he is on is not helping. Pt thinks robaxin is not helping much.     Review of Systems  Constitutional: Negative for chills, fatigue and fever.  HENT: Negative for congestion, drooling and ear pain.   Respiratory: Negative for cough, chest tightness, shortness of breath and wheezing.   Cardiovascular: Negative for chest pain and palpitations.  Gastrointestinal: Negative for abdominal pain.  Musculoskeletal: Negative for back pain.  Skin: Negative for rash.  Neurological: Negative for dizziness, seizures, weakness and numbness.  Hematological: Negative for adenopathy. Does not bruise/bleed easily.    Past Medical History:   Diagnosis Date  . Anxiety   . Arthritis    right knee  . Back pain    occasionally  . Bipolar disorder ([place])    denies  . COPD (chronic obstructive pulmonary disease) ([place])   . Depression   . Diabetes mellitus without complication ([place])   . Dizziness   . Generalized headaches    history of migraines-last one 02/02/14  . GERD (gastroesophageal reflux disease)    takes Prilosec daily as needed  . GERD (gastroesophageal reflux disease)   . Headache   . Hemorrhoids   . History of bronchitis    1 month ago and treated with Amoxicillin and given an inhaler;takes Prenisone  . Hyperlipidemia    takes Zetia daily  . Hypertension    takes HCTZ daily  . Joint pain   . Joint swelling   . Pneumonia    hx of 2 yrs ago  . Polysubstance abuse ([place])   . Schizoaffective disorder ([place])    denies  . Tuberculosis    treated for 1 year  . Umbilical hernia      Social History   Socioeconomic History  . Marital status: Single    Spouse name: Not on file  . Number of children: Not on file  . Years of education: Not on file  . Highest education level: Not on file  Occupational History  . Occupation: lifts and pulls concrete    Employer: [employer]   Social Needs  .  Financial resource strain: Not on file  . Food insecurity:    Worry: Not on file    Inability: Not on file  . Transportation needs:    Medical: Not on file    Non-medical: Not on file  Tobacco Use  . Smoking status: Current Every Day Smoker    Packs/day: 0.25    Years: 30.00    Pack years: 7.50  . Smokeless tobacco: Never Used  Substance and Sexual Activity  . Alcohol use: No    Frequency: Never    Comment: occasionally   . Drug use: Yes    Types: Marijuana, Cocaine    Comment: cocaine, night before last  . Sexual activity: Yes  Lifestyle  . Physical activity:    Days per week: Not on file    Minutes per session: Not on file  . Stress: Not on file  Relationships  . Social connections:    Talks on phone:  Not on file    Gets together: Not on file    Attends religious service: Not on file    Active member of club or organization: Not on file    Attends meetings of clubs or organizations: Not on file    Relationship status: Not on file  . Intimate partner violence:    Fear of current or ex partner: Not on file    Emotionally abused: Not on file    Physically abused: Not on file    Forced sexual activity: Not on file  Other Topics Concern  . Not on file  Social History Narrative   ** Merged History Encounter **       Lives with girlfriend. Work involves a lot of pulling and lifting concrete, was not definite about type of work. Denies use of illegal drugs. Smokes half pack of cigarettes daily.    Past Surgical History:  Procedure Laterality Date  . ESOPHAGOGASTRODUODENOSCOPY    . JOINT REPLACEMENT    . KNEE ARTHROSCOPY WITH MEDIAL MENISECTOMY Right 03/01/2014   Procedure: KNEE ARTHROSCOPY WITH MEDIAL MENISECTOMY;  Surgeon: Meredith Pel, MD;  Location: [facility];  Service: Orthopedics;  Laterality: Right;  . LIPOMA EXCISION  ~ 2010   "back left side of my head"  . PARTIAL KNEE ARTHROPLASTY Right 01/24/2015   Procedure: RIGHT KNEE MEDIAL COMPARTMENT REPLACEMENT VS TOTAL KNEE ARTHROPLASTY.;  Surgeon: Meredith Pel, MD;  Location: [facility];  Service: Orthopedics;  Laterality: Right;  RIGHT KNEE MEDIAL COMPARTMENT REPLACEMENT VS TOTAL KNEE ARTHROPLASTY.  . TOTAL KNEE ARTHROPLASTY Right     Family History  Problem Relation Age of Onset  . Hypertension Mother   . Cancer Mother   . Heart disease Father   . Heart attack Father 14  . Hyperlipidemia Father   . Hypertension Father   . Diabetes Sister   . Diabetes Brother     Allergies  Allergen Reactions  . Mushroom Extract Complex Hives  . Penicillins Hives  . Penicillins Hives    Has patient had a PCN reaction causing immediate rash, facial/tongue/throat swelling, SOB or lightheadedness with hypotension: no Has patient had a PCN  reaction causing severe rash involving mucus membranes or skin necrosis: no Has patient had a PCN reaction that required hospitalization: no Has patient had a PCN reaction occurring within the last 10 years: yes If all of the above answers are "NO", then may proceed with Cephalosporin use.    Current Outpatient Medications on File Prior to Visit  Medication Sig Dispense Refill  .  atorvastatin (LIPITOR) 10 MG tablet Take 1 tablet (10 mg total) by mouth daily. 30 tablet 3  . fluticasone (FLONASE) 50 MCG/ACT nasal spray Place 1 spray into both nostrils daily. 16 g 0  . gabapentin (NEURONTIN) 100 MG capsule Take 3 capsules (300 mg total) by mouth at bedtime. 30 capsule 0  . glucose blood (ONETOUCH VERIO) test strip Use as instructed to check blood sugar three times daily. DX E11.9 100 each 5  . methocarbamol (ROBAXIN) 500 MG tablet Take 1 tablet (500 mg total) by mouth every 8 (eight) hours as needed. 60 tablet 1  . OLANZapine (ZYPREXA) 5 MG tablet Take 5 mg by mouth at bedtime.    . ondansetron (ZOFRAN ODT) 4 MG disintegrating tablet Take 1 tablet (4 mg total) by mouth every 8 (eight) hours as needed for nausea or vomiting. 5 tablet 0  . ONETOUCH DELICA LANCETS 54O MISC USE TO CHECK BLOOD SUGAR 3 TIMES DAILY. DX E11.9 100 each 3  . sertraline (ZOLOFT) 50 MG tablet Take 50 mg by mouth daily.    . sitaGLIPtin (JANUVIA) 25 MG tablet Take 1 tablet (25 mg total) by mouth daily. 30 tablet 3  . tadalafil (CIALIS) 10 MG tablet Take 1 tablet (10 mg total) by mouth daily as needed for erectile dysfunction. 10 tablet 0  . traZODone (DESYREL) 50 MG tablet Take 50 mg by mouth at bedtime.    . Vitamin D, Ergocalciferol, (DRISDOL) 50000 units CAPS capsule Take 1 capsule (50,000 Units total) by mouth every 7 (seven) days. 8 capsule 0   No current facility-administered medications on file prior to visit.     BP 121/80   Pulse (!) 58   Temp 98.4 F (36.9 C) (Oral)   Resp 16   Ht 5\' 8"  (1.727 m)   Wt 220 lb  12.8 oz (100.2 kg)   SpO2 98%   BMI 33.57 kg/m       Objective:   Physical Exam  General Mental Status- Alert. General Appearance- Not in acute distress.   Skin General: Color- Normal Color. Moisture- Normal Moisture.  Neck Carotid Arteries- Normal color. Moisture- Normal Moisture. No carotid bruits. No JVD. Rt trapezius tenderness to palpation.  Chest and Lung Exam Auscultation: Breath Sounds:-Normal.  Cardiovascular Auscultation:Rythm- Regular. Murmurs & Other Heart Sounds:Auscultation of the heart reveals- No Murmurs.  Abdomen Inspection:-Inspeection Normal. Palpation/Percussion:Note:No mass. Palpation and Percussion of the abdomen reveal- Non Tender, Non Distended + BS, no rebound or guarding.  Neurologic Cranial Nerve exam:- CN III-XII intact(No nystagmus), symmetric smile. Strength:- 5/5 equal and symmetric strength both upper and lower extremities.      Assessment & Plan:  Your blood sugars was 134 today and this was after eating some candy.  Your sugar was well controlled previously on metformin alone and then on Onglyza alone.  Unfortunately you could not tolerate metformin side effects.  Then subsequently had success with Onglyza but then it was not covered under your insurance plan.  Recently switched to Januvia as this was better covered but then you had side effects with Januvia.  So at this point recommend that you eat low sugar diet and check your sugars 3 times a day.  Additional check if you are symptomatic.  I am resending prescription of Onglyza to your pharmacy and seeing if you need prior authorization.  If so we will go through that process.  If your sugars are going above 200 while we wait for prescription to be processed then please let me  know.  Any major increase such as over 400 then recommend ED evaluation.    For neck pain, I want you to stop methocarbamol and can try Zanaflex/tinazadine.  You mentioned various side effects with use of Januvia  but since he had some abdomen discomfort do want to include metabolic panel as well as amylase and lipase today.  Follow-up date to be determined after lab review.  [name], PA-C

## 2018-12-04 NOTE — Patient Instructions (Addendum)
Your blood sugars was 134 today and this was after eating some candy.  Your sugar was well controlled previously on metformin alone and then on Onglyza alone.  Unfortunately you could not tolerate metformin side effects.  Then subsequently had success with Onglyza but then it was not covered under your insurance plan.  Recently switched to Januvia as this was better covered but then you had side effects with Januvia.  So at this point recommend that you eat low sugar diet and check your sugars 3 times a day.  Additional check if you are symptomatic.  I am resending prescription of Onglyza to your pharmacy and seeing if you need prior authorization.  If so we will go through that process.  If your sugars are going above 200 while we wait for prescription to be processed then please let me know.  Any major increase such as over 400 then recommend ED evaluation.    For neck pain, I want you to stop methocarbamol and can try Zanaflex/tinazadine.  You mentioned various side effects with use of Januvia but since he had some abdomen discomfort do want to include metabolic panel as well as amylase and lipase today.  Follow-up date to be determined after lab review.

## 2018-12-04 NOTE — Telephone Encounter (Signed)
Received Physician Orders from [facility] for diabetic testing supplies; forwarded to provider/SLS 02/27

## 2018-12-04 NOTE — Telephone Encounter (Signed)
Pt in office today before taking pt's vitals I asked pt to remove his jacket . Pt states "why I have on a jacket and sweater". I told pt "Because its too much" (meaning the sweater and jacted together.) Pt states  " Oooh say that again" I said "your jacket and sweater is too much I need you to remove your jacket". Pt states "no say it like you said it before"

## 2018-12-08 ENCOUNTER — Telehealth: Payer: Self-pay | Admitting: Medical

## 2018-12-08 ENCOUNTER — Ambulatory Visit: Payer: Self-pay | Admitting: *Deleted

## 2018-12-08 NOTE — Telephone Encounter (Signed)
Pt had dizziness and itching with Tong[place]. Could not tolerate GI side effects of metformin. Can you coodrindate with pharmacy and try to do prior authorization for onglyza 5 mg which he was on before and did well with.  Regarding pt vomiting recommend that he be seen by in office if we have availability or be seen in ED. He attributes vomiting to not being on onglyza. I don't think that is case.

## 2018-12-08 NOTE — Telephone Encounter (Signed)
Copied from [place] [id number]. Topic: Quick Communication - Lab Results (Clinic Use ONLY) >> Dec 08, 2018  5:07 PM Hinton Dyer, [place] wrote: Called patient to inform them of 12/04/18 lab results. When patient returns call, triage nurse may disclose results. >> Dec 08, 2018  5:35 PM Wynetta Emery, [place] C wrote: While speaking with office I was advised to have Triage nurse go over results with pt directly as the office did not want to speak with pt directly.

## 2018-12-08 NOTE — Telephone Encounter (Signed)
Results given and documented in result note. Also see triage encounter from 12/08/18.

## 2018-12-08 NOTE — Telephone Encounter (Signed)
Pt calling with complaints of vomiting for the past 2-3 days and has not been able to tolerate fluids or solids. Pt believes that vomiting is due to not having diabetic medication, Saxagliptin HCL 5 mg tab. Pt advised to seek treatment in the ED but states that he went to the ED previously for current symptoms but had to wait for 3 hours. Pt does not want to go back to the ED at this time. Offered to make pt appt which he declines at this time. Pt given home care advice and advised to return call to the office if symptoms become worse. Understanding verbalized.  Reason for Disposition . MILD or MODERATE vomiting (e.g., 1 - 5 times / day)  Answer Assessment - Initial Assessment Questions 1. VOMITING SEVERITY: "How many times have you vomited in the past 24 hours?"     - MILD:  1 - 2 times/day    - MODERATE: 3 - 5 times/day, decreased oral intake without significant weight loss or symptoms of dehydration    - SEVERE: 6 or more times/day, vomits everything or nearly everything, with significant weight loss, symptoms of dehydration      Specific episodes of vomiting not given 2. ONSET: "When did the vomiting begin?"      2-3 days ago 3. FLUIDS: "What fluids or food have you vomited up today?" "Have you been able to keep any fluids down?"     Pt states he has not been able to tolerate keeping anything down but then states he has been trying to sip on gatorade 4. DIARRHEA: "Is there any diarrhea?" If so, ask: "How many times today?"      No 5. CONTACTS: "Is there anyone else in the family with the same symptoms?"      No 6. CAUSE: "What do you think is causing your vomiting?"     Thinks it may be related to not having diabetic medicine 7. OTHER SYMPTOMS: "Do you have any other symptoms?" (e.g., fever, headache, vertigo, vomiting blood or coffee grounds, recent head injury)     No other symptoms voiced at this time  Protocols used: [facility]

## 2018-12-08 NOTE — Telephone Encounter (Signed)
Copied from [place] [id number]. Topic: Quick Communication - See Telephone Encounter >> Dec 08, 2018  5:25 PM Antonieta Iba C wrote: CRM for notification. See Telephone encounter for: 12/08/18.  Pt called in stating that his pharmacy advised him that a PA is needed for medication saxagliptin HCl (ONGLYZA) 5 MG TABS tablet. Pt says that she reached out to his insurance company and they advised that pt's PCP has to call. Pt has been without his medication for days. Pt is currently actively vomiting pt states that it is due to him not taking his medication. Pt would like to have authorization completed as soon as possible.   Pt says meanwhile waiting for medication authorization, pt states that he need his PCP's advise on what could he do meanwhile without his medication?    Please advise.

## 2018-12-11 ENCOUNTER — Ambulatory Visit: Payer: Medicare Other | Attending: Family Medicine

## 2018-12-18 ENCOUNTER — Ambulatory Visit: Payer: Medicare Other

## 2018-12-21 ENCOUNTER — Other Ambulatory Visit: Payer: Medicare Other

## 2018-12-24 ENCOUNTER — Other Ambulatory Visit: Payer: Self-pay

## 2018-12-24 MED ORDER — GLUCOSE BLOOD VI STRP: ORAL_STRIP | 5 refills | Status: DC

## 2018-12-25 ENCOUNTER — Encounter: Payer: Medicare Other | Admitting: Physical Therapy

## 2018-12-28 NOTE — Telephone Encounter (Signed)
We call patient and pass along information I had mentioned in message.Maybe go ahead and get information so can start trying to work on prior authorization.

## 2018-12-28 NOTE — Telephone Encounter (Signed)
I am not sure what the status of onglyza prior authorization is. But can someone check on that. If he is vomiting a lot or having abdomen pain then he may need office visit. Just double check no covid criteria recently.  I do not think his vomiting is from being off of onglyza which he thinks. I remember his sugars have been well controlled per his report even without medication?  Would offer virtual visit but with constant vomiting complaints I don't think this is appropiate. Physical exam or labs in office would be helpful.

## 2018-12-28 NOTE — Telephone Encounter (Signed)
I am unable to initiate w/o pharmacy insurance information.

## 2018-12-28 NOTE — Telephone Encounter (Signed)
Patient calling and would like an update regarding this. States that he feel like he has been put on the back burner.

## 2018-12-29 NOTE — Telephone Encounter (Signed)
[place] at 9:00am this morning to have them send over [employer] information, as we only have Medicare A&B on file; she stated that pt had [employer] D and she would fax to clinic number given [phone]; I am still awaiting fax, not on either machine/SLS 03/24

## 2018-12-29 NOTE — Telephone Encounter (Signed)
Pt called to check status. Pt states that he has not had medication in two weeks. Pt would like a call back as soon as possible. Pt states that he will be changing pharmacy [facility] [address] [facility] [place].    CB#[phone]

## 2018-12-30 MED ORDER — SAXAGLIPTIN HCL 5 MG PO TABS: 5.0000 mg | ORAL_TABLET | Freq: Every day | ORAL | 0 refills | Status: DC

## 2018-12-30 NOTE — Telephone Encounter (Signed)
Rx sent to new pharmacy/SLS 03/25

## 2018-12-30 NOTE — Telephone Encounter (Signed)
Pt states he has not had any  saxagliptin HCl (ONGLYZA) 5 MG TABS tablet For over 2 weeks.  Also, pt wants to change all meds to  [id number] [place], [place] - [address] AT [place] (Phone) [phone] (Fax)

## 2019-01-06 ENCOUNTER — Telehealth: Payer: Self-pay | Admitting: Medical

## 2019-01-06 NOTE — Telephone Encounter (Signed)
Pt left message on voicemail stating that the insurance paperwork for is onglyza is not correct;  the pt says that says that he has been out of medication for 3 weeks, and he does not feel well; he also [facility]; the pt would like to be called back with the status of his request.

## 2019-01-07 ENCOUNTER — Telehealth: Payer: Self-pay

## 2019-01-07 NOTE — Telephone Encounter (Signed)
PA initiated via Covermymeds; KEY: [id number]. Awaiting determination.    Prescription information:  BIN: [id number] PCN: [id number] ID: [id number]

## 2019-01-07 NOTE — Telephone Encounter (Signed)
[facility]ed [facility] and retrieved [employer] information, as we do not have that documented in pt's chart [only A/B Medicare]. Pt has [employer] D; retrieved all information from pharmacy, printed out snapshot and wrote information on it; forwarded to [facility] for PA attempt on medication/SLS 04/02

## 2019-01-08 NOTE — Telephone Encounter (Addendum)
Pt would like to know what should he do in meantime while waiting on onglyza PA. Pt would like a callback today ok to leave a message on cell

## 2019-01-08 NOTE — Telephone Encounter (Signed)
° ° °   Pt said he is waiting on a call back

## 2019-01-08 NOTE — Telephone Encounter (Signed)
PA denied. Preferred alternatives is Januvia (allergic reaction) and Tradjenta. Must try tradjenta prior to coverage of Onglyza.

## 2019-01-09 ENCOUNTER — Telehealth: Payer: Self-pay | Admitting: Medical

## 2019-01-09 MED ORDER — LINAGLIPTIN 5 MG PO TABS: 5.0000 mg | ORAL_TABLET | Freq: Every day | ORAL | 3 refills | Status: DC

## 2019-01-09 NOTE — Telephone Encounter (Signed)
Rx tradjenta sent to pt pharmacy.

## 2019-01-09 NOTE — Telephone Encounter (Signed)
Rx tradjenta. Sent to pt pharmacy. Let pt know we have to try that since insurance denied prior auth and requires him to try tradjenta. If he wants to discuss he can make webex/virtual visit.  Contact pharmacy to see if it is covered/they filled.

## 2019-01-11 NOTE — Telephone Encounter (Signed)
Rx covered. Pt informed of change via MyChart.

## 2019-02-09 ENCOUNTER — Other Ambulatory Visit: Payer: Self-pay

## 2019-10-21 DIAGNOSIS — Z20828 Contact with and (suspected) exposure to other viral communicable diseases: Secondary | ICD-10-CM | POA: Diagnosis not present

## 2019-12-23 ENCOUNTER — Other Ambulatory Visit: Payer: Self-pay

## 2019-12-24 ENCOUNTER — Encounter: Payer: Self-pay | Admitting: Medical

## 2019-12-24 ENCOUNTER — Other Ambulatory Visit: Payer: Self-pay

## 2019-12-24 ENCOUNTER — Other Ambulatory Visit: Payer: Self-pay | Admitting: Medical

## 2019-12-24 ENCOUNTER — Ambulatory Visit (INDEPENDENT_AMBULATORY_CARE_PROVIDER_SITE_OTHER): Payer: Medicare Other | Admitting: Medical

## 2019-12-24 VITALS — BP 120/70 | HR 86 | Temp 95.7°F | Resp 18 | Ht 68.0 in | Wt 235.0 lb

## 2019-12-24 DIAGNOSIS — E119 Type 2 diabetes mellitus without complications: Secondary | ICD-10-CM

## 2019-12-24 DIAGNOSIS — E785 Hyperlipidemia, unspecified: Secondary | ICD-10-CM

## 2019-12-24 DIAGNOSIS — B351 Tinea unguium: Secondary | ICD-10-CM

## 2019-12-24 DIAGNOSIS — E1169 Type 2 diabetes mellitus with other specified complication: Secondary | ICD-10-CM | POA: Diagnosis not present

## 2019-12-24 LAB — COMPREHENSIVE METABOLIC PANEL
ALT: 33 U/L (ref 0–53)
AST: 35 U/L (ref 0–37)
Albumin: 4.1 g/dL (ref 3.5–5.2)
Alkaline Phosphatase: 110 U/L (ref 39–117)
BUN: 17 mg/dL (ref 6–23)
CO2: 29 mEq/L (ref 19–32)
Calcium: 9.3 mg/dL (ref 8.4–10.5)
Chloride: 102 mEq/L (ref 96–112)
Creatinine, Ser: 1 mg/dL (ref 0.40–1.50)
GFR: 94.1 mL/min (ref >60.00)
Glucose, Bld: 140 mg/dL — ABNORMAL HIGH (ref 70–99)
Potassium: 4.5 mEq/L (ref 3.5–5.1)
Sodium: 136 mEq/L (ref 135–145)
Total Bilirubin: 0.5 mg/dL (ref 0.2–1.2)
Total Protein: 7 g/dL (ref 6.0–8.3)

## 2019-12-24 LAB — HEMOGLOBIN A1C: Hgb A1c MFr Bld: 9.4 % — ABNORMAL HIGH (ref 4.6–6.5)

## 2019-12-24 MED ORDER — FREESTYLE SYSTEM KIT: 1.0000 | PACK | 0 refills | Status: DC | PRN

## 2019-12-24 MED ORDER — ONETOUCH VERIO VI STRP: ORAL_STRIP | 5 refills | Status: DC

## 2019-12-24 MED ORDER — GLIPIZIDE 10 MG PO TABS: 10.0000 mg | ORAL_TABLET | Freq: Two times a day (BID) | ORAL | 0 refills | Status: DC

## 2019-12-24 MED ORDER — GLUCOSE BLOOD VI STRP: ORAL_STRIP | 12 refills | Status: DC

## 2019-12-24 MED ORDER — ONETOUCH DELICA LANCETS 33G MISC: 3 refills | Status: DC

## 2019-12-24 MED ORDER — FREESTYLE LANCETS MISC: 12 refills | Status: DC

## 2019-12-24 MED ORDER — ATORVASTATIN CALCIUM 10 MG PO TABS: 10.0000 mg | ORAL_TABLET | Freq: Every day | ORAL | 3 refills | Status: DC

## 2019-12-24 MED ORDER — FREESTYLE TEST VI STRP: ORAL_STRIP | 12 refills | Status: DC

## 2019-12-24 NOTE — Addendum Note (Signed)
Addended by: Wynonia Musty A on: 12/24/2019 01:41 PM   Modules accepted: Orders

## 2019-12-24 NOTE — Patient Instructions (Signed)
For diabetes will cmp and get a1c. Will rx glipizide 10 mg twice daily. Check sugar 3 times daily. If you get sugars less than 70 let me know but also eat food or drink beverage to get sugar up.  For hx of high cholesterol rx atorvastatin. Will get lipid panel later when fasting.  Follow up date to be determined after lab review.

## 2019-12-24 NOTE — Progress Notes (Addendum)
Subjective:    Patient ID: Andrew [name], male    DOB: November 07, 1965, 54 y.o.   MRN: [id number]  HPI  Pt in for refill medication.   He has diabetes and while he was incarcerated he was on glipizide.He in past could not tolerate metformin. In past I tried to rx other meds and I recall difficulty filling meds. He has been on glipizide since he was incarcerated.   Pt had high blood sugars in October since he was off medication for 6 weeks.  He tells me he was on sliding scale insulin while he was incarcerated.  He states he does not want to get back on insulin.  He now weights 235 lb.  Pt had been on glipizide 10 mg twice a day.   Recently his last sugar reading was 154.  Pt used to hae one touch verio. He was checking sugars 3 times daily.  Pt ate breakfast today.    Review of Systems  Constitutional: Negative for chills, fatigue and fever.  Respiratory: Negative for cough, chest tightness and wheezing.   Cardiovascular: Negative for chest pain and palpitations.  Gastrointestinal: Negative for abdominal pain, constipation and nausea.  Endocrine: Negative for polydipsia, polyphagia and polyuria.  Genitourinary: Negative for dysuria.  Musculoskeletal: Negative for back pain.  Skin: Negative for rash.  Neurological: Negative for dizziness, syncope, weakness, numbness and headaches.  Hematological: Negative for adenopathy. Does not bruise/bleed easily.  Psychiatric/Behavioral: Negative for behavioral problems, confusion, sleep disturbance and suicidal ideas. The patient is not nervous/anxious.     Past Medical History:  Diagnosis Date  . Anxiety   . Arthritis    right knee  . Back pain    occasionally  . Bipolar disorder ([place])    denies  . COPD (chronic obstructive pulmonary disease) ([place])   . Depression   . Diabetes mellitus without complication ([place])   . Dizziness   . Generalized headaches    history of migraines-last one 02/02/14  . GERD (gastroesophageal reflux  disease)    takes Prilosec daily as needed  . GERD (gastroesophageal reflux disease)   . Headache   . Hemorrhoids   . History of bronchitis    1 month ago and treated with Amoxicillin and given an inhaler;takes Prenisone  . Hyperlipidemia    takes Zetia daily  . Hypertension    takes HCTZ daily  . Joint pain   . Joint swelling   . Pneumonia    hx of 2 yrs ago  . Polysubstance abuse ([place])   . Schizoaffective disorder ([place])    denies  . Tuberculosis    treated for 1 year  . Umbilical hernia      Social History   Socioeconomic History  . Marital status: Single    Spouse name: Not on file  . Number of children: Not on file  . Years of education: Not on file  . Highest education level: Not on file  Occupational History  . Occupation: lifts and pulls concrete    Employer: [employer]   Tobacco Use  . Smoking status: Current Every Day Smoker    Packs/day: 0.25    Years: 30.00    Pack years: 7.50  . Smokeless tobacco: Never Used  Substance and Sexual Activity  . Alcohol use: No    Comment: occasionally   . Drug use: Yes    Types: Marijuana, Cocaine    Comment: cocaine, night before last  . Sexual activity: Yes  Other Topics Concern  .  Not on file  Social History Narrative   ** Merged History Encounter **       Lives with girlfriend. Work involves a lot of pulling and lifting concrete, was not definite about type of work. Denies use of illegal drugs. Smokes half pack of cigarettes daily.   Social Determinants of Health   Financial Resource Strain:   . Difficulty of Paying Living Expenses:   Food Insecurity:   . Worried About Charity fundraiser in the Last Year:   . Arboriculturist in the Last Year:   Transportation Needs:   . Film/video editor (Medical):   Marland Kitchen Lack of Transportation (Non-Medical):   Physical Activity:   . Days of Exercise per Week:   . Minutes of Exercise per Session:   Stress:   . Feeling of Stress :   Social Connections:   . Frequency  of Communication with Friends and Family:   . Frequency of Social Gatherings with Friends and Family:   . Attends Religious Services:   . Active Member of Clubs or Organizations:   . Attends Archivist Meetings:   Marland Kitchen Marital Status:   Intimate Partner Violence:   . Fear of Current or Ex-Partner:   . Emotionally Abused:   Marland Kitchen Physically Abused:   . Sexually Abused:     Past Surgical History:  Procedure Laterality Date  . ESOPHAGOGASTRODUODENOSCOPY    . JOINT REPLACEMENT    . KNEE ARTHROSCOPY WITH MEDIAL MENISECTOMY Right 03/01/2014   Procedure: KNEE ARTHROSCOPY WITH MEDIAL MENISECTOMY;  Surgeon: Meredith Pel, MD;  Location: [facility];  Service: Orthopedics;  Laterality: Right;  . LIPOMA EXCISION  ~ 2010   "back left side of my head"  . PARTIAL KNEE ARTHROPLASTY Right 01/24/2015   Procedure: RIGHT KNEE MEDIAL COMPARTMENT REPLACEMENT VS TOTAL KNEE ARTHROPLASTY.;  Surgeon: Meredith Pel, MD;  Location: [facility];  Service: Orthopedics;  Laterality: Right;  RIGHT KNEE MEDIAL COMPARTMENT REPLACEMENT VS TOTAL KNEE ARTHROPLASTY.  . TOTAL KNEE ARTHROPLASTY Right     Family History  Problem Relation Age of Onset  . Hypertension Mother   . Cancer Mother   . Heart disease Father   . Heart attack Father 56  . Hyperlipidemia Father   . Hypertension Father   . Diabetes Sister   . Diabetes Brother     Allergies  Allergen Reactions  . Mushroom Extract Complex Hives  . Penicillins Hives  . Penicillins Hives    Has patient had a PCN reaction causing immediate rash, facial/tongue/throat swelling, SOB or lightheadedness with hypotension: no Has patient had a PCN reaction causing severe rash involving mucus membranes or skin necrosis: no Has patient had a PCN reaction that required hospitalization: no Has patient had a PCN reaction occurring within the last 10 years: yes If all of the above answers are "NO", then may proceed with Cephalosporin use.    Current Outpatient  Medications on File Prior to Visit  Medication Sig Dispense Refill  . atorvastatin (LIPITOR) 10 MG tablet Take 1 tablet (10 mg total) by mouth daily. (Patient not taking: Reported on 12/24/2019) 30 tablet 3  . fluticasone (FLONASE) 50 MCG/ACT nasal spray Place 1 spray into both nostrils daily. (Patient not taking: Reported on 12/24/2019) 16 g 0  . gabapentin (NEURONTIN) 100 MG capsule Take 3 capsules (300 mg total) by mouth at bedtime. (Patient not taking: Reported on 12/24/2019) 30 capsule 0  . glucose blood (ONETOUCH VERIO) test strip Use as instructed to  check blood sugar three times daily. DX E11.9 100 each 5  . linagliptin (TRADJENTA) 5 MG TABS tablet Take 1 tablet (5 mg total) by mouth daily. 30 tablet 3  . methocarbamol (ROBAXIN) 500 MG tablet Take 1 tablet (500 mg total) by mouth every 8 (eight) hours as needed. (Patient not taking: Reported on 12/24/2019) 60 tablet 1  . OLANZapine (ZYPREXA) 5 MG tablet Take 5 mg by mouth at bedtime.    . ondansetron (ZOFRAN ODT) 4 MG disintegrating tablet Take 1 tablet (4 mg total) by mouth every 8 (eight) hours as needed for nausea or vomiting. (Patient not taking: Reported on 12/24/2019) 5 tablet 0  . ONETOUCH DELICA LANCETS 99991111 MISC USE TO CHECK BLOOD SUGAR 3 TIMES DAILY. DX E11.9 100 each 3  . sertraline (ZOLOFT) 50 MG tablet Take 50 mg by mouth daily.    . tadalafil (CIALIS) 10 MG tablet Take 1 tablet (10 mg total) by mouth daily as needed for erectile dysfunction. 10 tablet 0  . tiZANidine (ZANAFLEX) 4 MG tablet 1 tab po every 8 hours as needed muscle spasm/neck pain 30 tablet 0  . traZODone (DESYREL) 50 MG tablet Take 50 mg by mouth at bedtime.    . Vitamin D, Ergocalciferol, (DRISDOL) 50000 units CAPS capsule Take 1 capsule (50,000 Units total) by mouth every 7 (seven) days. (Patient not taking: Reported on 12/24/2019) 8 capsule 0   No current facility-administered medications on file prior to visit.    BP (!) 142/76 (BP Location: Left Arm, Patient  Position: Sitting, Cuff Size: Large)   Pulse 86   Temp (!) 95.7 F (35.4 C) (Temporal)   Resp 18   Ht 5\' 8"  (1.727 m)   Wt 235 lb (106.6 kg)   SpO2 97%   BMI 35.73 kg/m       Objective:   Physical Exam  General Mental Status- Alert. General Appearance- Not in acute distress.   Skin General: Color- Normal Color. Moisture- Normal Moisture.  Neck Carotid Arteries- Normal color. Moisture- Normal Moisture. No carotid bruits. No JVD.  Chest and Lung Exam Auscultation: Breath Sounds:-Normal.  Cardiovascular Auscultation:Rythm- Regular. Murmurs & Other Heart Sounds:Auscultation of the heart reveals- No Murmurs.  Abdomen Inspection:-Inspeection Normal. Palpation/Percussion:Note:No mass. Palpation and Percussion of the abdomen reveal- Non Tender, Non Distended + BS, no rebound or guarding.   Neurologic Cranial Nerve exam:- CN III-XII intact(No nystagmus), symmetric smile. Strength:- 5/5 equal and symmetric strength both upper and lower extremities.      Assessment & Plan:  For diabetes will cmp and get a1c. Will rx glipizide 10 mg twice daily. Check sugar 3 times daily. If you get sugars less than 70 let me know but also eat food or drink beverage to get sugar up.  For hx of high cholesterol rx atorvastatin. Will get lipid panel later when fasting.  Follow up date to be determined after lab review.  30 minutes spent with  pt. [name], PA-C

## 2019-12-27 ENCOUNTER — Telehealth: Payer: Self-pay | Admitting: General Practice

## 2019-12-27 NOTE — Telephone Encounter (Signed)
Patient is requesting for a different type of Glucose Meter [facility].  [facility] stated [facility] need they different brand since his insurance wouldn't  ... That will include Lancets and strips. Please advise

## 2019-12-27 NOTE — Telephone Encounter (Signed)
Contacted [facility] and they just needed his medicare part b card.  Andrew [name] faxed over a copy of card as I am working at home.  Patient notified of this as he does not even have a copy of his card.

## 2019-12-29 ENCOUNTER — Other Ambulatory Visit: Payer: Self-pay

## 2019-12-30 ENCOUNTER — Other Ambulatory Visit: Payer: Medicare Other

## 2020-01-05 ENCOUNTER — Other Ambulatory Visit: Payer: Medicare Other

## 2020-01-06 ENCOUNTER — Telehealth: Payer: Self-pay | Admitting: Medical

## 2020-01-06 NOTE — Chronic Care Management (AMB) (Signed)
  Chronic Care Management   Note  01/06/2020 Name: Andrew [name] MRN: [id number] DOB: August 05, 1966  Andrew [name] is a 54 y.o. year old male who is a primary care patient of Patient, No Pcp Per. I reached out to Andrew [name] by phone today in response to a referral sent by [name] PCP, Patient, No Pcp Per.   [name] was given information about Chronic Care Management services today including:  1. CCM service includes personalized support from designated clinical staff supervised by his physician, including individualized plan of care and coordination with other care providers 2. 24/7 contact phone numbers for assistance for urgent and routine care needs. 3. Service will only be billed when office clinical staff spend 20 minutes or more in a month to coordinate care. 4. Only one practitioner may furnish and bill the service in a calendar month. 5. The patient may stop CCM services at any time (effective at the end of the month) by phone call to the office staff.   Patient agreed to services and verbal consent obtained.   Follow up plan:   Raynicia Dukes [employer] Scheduler

## 2020-01-19 ENCOUNTER — Telehealth: Payer: Self-pay

## 2020-01-19 ENCOUNTER — Telehealth: Payer: Self-pay | Admitting: General Practice

## 2020-01-19 NOTE — Telephone Encounter (Signed)
Patient called stating his blood sugars have been ranging 80-115 without taking the glipizide for the past 3 weeks , and his lowest sugar reading was 54 but he will come with his blood sugar readings to his OV on the  19th .

## 2020-01-19 NOTE — Telephone Encounter (Signed)
Patient calling in regards to lab results please advise

## 2020-01-19 NOTE — Telephone Encounter (Signed)
Pt called

## 2020-01-20 NOTE — Telephone Encounter (Signed)
So he is not on any medication for his sugars? 80-115 seems good/acceptable. 54 without using any medications a bit odd. If any further less than 70 let me know. Orange juice or peanut butter can get low sugar up quickly if he has abnormal low again. Did he feel bad at time sugar was 54. If his machine new or old?

## 2020-01-21 ENCOUNTER — Telehealth: Payer: Self-pay

## 2020-01-21 NOTE — Telephone Encounter (Signed)
Patient called in to see if He could discuss his medication because of side effects please give the patient a call as soon as possible at [phone]

## 2020-01-21 NOTE — Telephone Encounter (Signed)
Pt called & lvm to return call 

## 2020-01-24 ENCOUNTER — Other Ambulatory Visit: Payer: Self-pay | Admitting: Emergency Medicine

## 2020-01-24 ENCOUNTER — Other Ambulatory Visit (INDEPENDENT_AMBULATORY_CARE_PROVIDER_SITE_OTHER): Payer: Medicare Other

## 2020-01-24 ENCOUNTER — Other Ambulatory Visit: Payer: Self-pay

## 2020-01-24 ENCOUNTER — Telehealth: Payer: Self-pay | Admitting: Medical

## 2020-01-24 ENCOUNTER — Telehealth: Payer: Self-pay

## 2020-01-24 DIAGNOSIS — E1169 Type 2 diabetes mellitus with other specified complication: Secondary | ICD-10-CM

## 2020-01-24 DIAGNOSIS — E785 Hyperlipidemia, unspecified: Secondary | ICD-10-CM

## 2020-01-24 DIAGNOSIS — Z125 Encounter for screening for malignant neoplasm of prostate: Secondary | ICD-10-CM

## 2020-01-24 DIAGNOSIS — Z0000 Encounter for general adult medical examination without abnormal findings: Secondary | ICD-10-CM

## 2020-01-24 LAB — LIPID PANEL
Cholesterol: 170 mg/dL (ref 0–200)
HDL: 36.9 mg/dL — ABNORMAL LOW (ref >39.00)
LDL Cholesterol: 119 mg/dL — ABNORMAL HIGH (ref 0–99)
NonHDL: 132.77
Total CHOL/HDL Ratio: 5
Triglycerides: 70 mg/dL (ref 0.0–149.0)
VLDL: 14 mg/dL (ref 0.0–40.0)

## 2020-01-24 MED ORDER — ATORVASTATIN CALCIUM 10 MG PO TABS: 10.0000 mg | ORAL_TABLET | Freq: Every day | ORAL | 3 refills | Status: DC

## 2020-01-24 NOTE — Telephone Encounter (Signed)
Patient came into the office today for an unexpected lab appointment, labs were drawn and test can be ordered for the blood drawn.

## 2020-01-24 NOTE — Telephone Encounter (Signed)
Rx atorvastatin sent to pt pharmacy. 

## 2020-01-25 ENCOUNTER — Encounter: Payer: Self-pay | Admitting: Medical

## 2020-01-25 ENCOUNTER — Telehealth (INDEPENDENT_AMBULATORY_CARE_PROVIDER_SITE_OTHER): Payer: Medicare Other | Admitting: Medical

## 2020-01-25 DIAGNOSIS — E119 Type 2 diabetes mellitus without complications: Secondary | ICD-10-CM

## 2020-01-25 DIAGNOSIS — E1169 Type 2 diabetes mellitus with other specified complication: Secondary | ICD-10-CM

## 2020-01-25 DIAGNOSIS — E785 Hyperlipidemia, unspecified: Secondary | ICD-10-CM | POA: Diagnosis not present

## 2020-01-25 DIAGNOSIS — Z125 Encounter for screening for malignant neoplasm of prostate: Secondary | ICD-10-CM | POA: Diagnosis not present

## 2020-01-25 MED ORDER — GLUCOSE BLOOD VI STRP: ORAL_STRIP | 12 refills | Status: DC

## 2020-01-25 MED ORDER — GLIPIZIDE 5 MG PO TABS: 5.0000 mg | ORAL_TABLET | Freq: Every day | ORAL | 3 refills | Status: DC

## 2020-01-25 NOTE — Patient Instructions (Signed)
I do think is best for you to decrease your glipizide to 5 mg daily.  Recently 10 mg twice daily dose caused hypoglycemia.  You were on high dose formally when you were incarcerated.  Now you describe eating better, exercising and losing weight recently.  I do want to avoid any recurrent low sugars.  Prior side effects with Metformin and other medications were too expensive.  Describes not wanting to be on insulin.  Recent high cholesterol so we will go ahead and refill your atorvastatin.  Continue to check blood sugars fasting in the morning and 1 postmeal.  If sugars are higher over the next 2 to 3 weeks then might need to titrate up to glipizide 5 mg twice daily.  Follow-up in 2 months or as needed.

## 2020-01-25 NOTE — Progress Notes (Signed)
Subjective:    Patient ID: Andrew [name], male    DOB: Jul 31, 1966, 54 y.o.   MRN: [id number]  For your diabetes, I think it is best for you to decrease her glipizide to just 5 mg daily.  Do encourage you to keep eating better, getting daily exercise and continue to lose weight.  Recent high A1c in 9   Virtual Visit via Telephone Note  I connected with Andrew [name] on 01/25/20 at 11:00 AM EDT by telephone and verified that I am speaking with the correct person using two identifiers.  Location: Patient: home Provider: office   I discussed the limitations, risks, security and privacy concerns of performing an evaluation and management service by telephone and the availability of in person appointments. I also discussed with the patient that there may be a patient responsible charge related to this service. The patient expressed understanding and agreed to proceed.   History of Present Illness:  Pt states about 4 weeks ago his sugar went down to 54. He ate candy bar to get sugar back up.  This occurred while he was on glipizide 10 mg twice daily which he was on formally when he was incarcerated.  Pt states he has not been taking his glipizide over the last 3 weeks.  Despite not taking glipizide he states his sugars have been 80-115 most of the time. One time his sugar was 180.  Pt this morning was 95 this morning. Today after eating was 110.  Pt last a1c was 220. Prior 3 months he was incarcerated. His diet was not good. He states was high carb foods.  He had gained significant amount of weight while incarcerated.  Since leaving the jail he describes feeling much better, exercising daily and he has lost recently 3 to 5 pounds.  He had GI side effects with Metformin in the past.  He does not want to be on insulin and various medications in the past were too expensive for him.      Observations/Objective: General- no acute distress, pleasant, alert and no distress. Normal  speech.  Lipid panel done yesterday showed mild LDL elevation.    Assessment and Plan: I do think is best for you to decrease your glipizide to 5 mg daily.  Recently 10 mg twice daily dose caused hypoglycemia.  You were on high dose formally when you were incarcerated.  Now you describe eating better, exercising and losing weight recently.  I do want to avoid any recurrent low sugars.  Prior side effects with Metformin and other medications were too expensive.  Describes not wanting to be on insulin.  Recent high cholesterol so we will go ahead and refill your atorvastatin.  Continue to check blood sugars fasting in the morning and 1 postmeal.  If sugars are higher over the next 2 to 3 weeks then might need to titrate up to glipizide 5 mg twice daily.  Follow-up in 2 months or as needed.  Follow Up Instructions:    I discussed the assessment and treatment plan with the patient. The patient was provided an opportunity to ask questions and all were answered. The patient agreed with the plan and demonstrated an understanding of the instructions.   The patient was advised to call back or seek an in-person evaluation if the symptoms worsen or if the condition fails to improve as anticipated.  I provided 20 minutes of non-face-to-face time during this encounter.   [name], PA-C   Review of Systems  Objective:   Physical Exam        Assessment & Plan:

## 2020-01-28 ENCOUNTER — Ambulatory Visit: Payer: Medicare Other | Admitting: Pharmacist

## 2020-01-28 ENCOUNTER — Telehealth: Payer: Self-pay | Admitting: Medical

## 2020-01-28 DIAGNOSIS — E119 Type 2 diabetes mellitus without complications: Secondary | ICD-10-CM

## 2020-01-28 DIAGNOSIS — E1169 Type 2 diabetes mellitus with other specified complication: Secondary | ICD-10-CM

## 2020-01-28 NOTE — Telephone Encounter (Signed)
glucose blood test strip [id number]    [facility], will not paid for test strips. Patient advised to test twice day , patient is running earlier due to [facility] part b only paiding for testing once a day   Please advise    [phone]

## 2020-01-28 NOTE — Chronic Care Management (AMB) (Addendum)
Chronic Care Management Pharmacy  Name: Andrew [name]  MRN: [id number] DOB: 12-28-1965  Chief Complaint/ HPI  Andrew [name],  54 y.o. , male presents for their Initial CCM visit with the clinical pharmacist via telephone due to COVID-19 Pandemic.  PCP : Andrew Pai, PA-C  Their chronic conditions include:  Diabetes, Hyperlipidemia, Vitamin D Deficiency, Depression  Office Visits: 01/25/20: Visit w/ Andrew Pai, PA-C - Decreased glipizide to 5m daily.   12/24/19: Visit w/ Andrew Pai PA-C - Restart glipizide 155mBID. Advised pt on how to treat lows. Refill atorvastatin.   Consult Visit: None in last 6 months  Medications: Outpatient Encounter Medications as of 01/28/2020  Medication Sig Note   glipiZIDE (GLUCOTROL) 5 MG tablet Take 1 tablet (5 mg total) by mouth daily before breakfast.    [DISCONTINUED] atorvastatin (LIPITOR) 10 MG tablet Take 1 tablet (10 mg total) by mouth daily.    atorvastatin (LIPITOR) 10 MG tablet Take 1 tablet (10 mg total) by mouth daily.    fluticasone (FLONASE) 50 MCG/ACT nasal spray Place 1 spray into both nostrils daily. (Patient not taking: Reported on 12/24/2019)    gabapentin (NEURONTIN) 100 MG capsule Take 3 capsules (300 mg total) by mouth at bedtime. (Patient not taking: Reported on 12/24/2019)    glucose monitoring kit (FREESTYLE) monitoring kit 1 each by Does not apply route as needed for other.    methocarbamol (ROBAXIN) 500 MG tablet Take 1 tablet (500 mg total) by mouth every 8 (eight) hours as needed. (Patient not taking: Reported on 12/24/2019)    OLANZapine (ZYPREXA) 5 MG tablet Take 5 mg by mouth at bedtime. 01/28/2020: Patient doesn't remember taking these meds   ondansetron (ZOFRAN ODT) 4 MG disintegrating tablet Take 1 tablet (4 mg total) by mouth every 8 (eight) hours as needed for nausea or vomiting. (Patient not taking: Reported on 12/24/2019)    sertraline (ZOLOFT) 50 MG tablet Take 50 mg by mouth daily. 01/28/2020: Patient  doesn't remember taking these meds   tadalafil (CIALIS) 10 MG tablet Take 1 tablet (10 mg total) by mouth daily as needed for erectile dysfunction. (Patient not taking: Reported on 01/28/2020)    tiZANidine (ZANAFLEX) 4 MG tablet 1 tab po every 8 hours as needed muscle spasm/neck pain (Patient not taking: Reported on 01/28/2020)    traZODone (DESYREL) 50 MG tablet Take 50 mg by mouth at bedtime. 01/28/2020: Patient reports he never took this medication   Vitamin D, Ergocalciferol, (DRISDOL) 50000 units CAPS capsule Take 1 capsule (50,000 Units total) by mouth every 7 (seven) days. (Patient not taking: Reported on 12/24/2019)    [DISCONTINUED] glucose blood (FREESTYLE TEST STRIPS) test strip Use as instructed    [DISCONTINUED] glucose blood (ONETOUCH VERIO) test strip Use as instructed to check blood sugar three times daily. DX E11.9    [DISCONTINUED] glucose blood test strip Use as instructed    [DISCONTINUED] Lancets (FREESTYLE) lancets Use as instructed    [DISCONTINUED] OneTouch Delica Lancets 3356EISC USE TO CHECK BLOOD SUGAR 3 TIMES DAILY. DX E11.9    No facility-administered encounter medications on file as of 01/28/2020.   SDOH Screenings   Alcohol Screen:    Last Alcohol Screening Score (AUDIT):   Depression (PHQ2-9): Medium Risk   PHQ-2 Score: 17  Financial Resource Strain:    Difficulty of Paying Living Expenses:   Food Insecurity:    Worried About RuCharity fundraisern the Last Year:    RaArboriculturistn the  Last Year:   Housing:    Last Housing Risk Score:   Physical Activity:    Days of Exercise per Week:    Minutes of Exercise per Session:   Social Connections:    Frequency of Communication with Friends and Family:    Frequency of Social Gatherings with Friends and Family:    Attends Religious Services:    Active Member of Clubs or Organizations:    Attends Music therapist:    Marital Status:   Stress:    Feeling of Stress :   Tobacco Use: High Risk    Smoking Tobacco Use: Current Every [name] Smoker   Smokeless Tobacco Use: Never Used  Transportation Needs:    Film/video editor (Medical):    Lack of Transportation (Non-Medical):      Current Diagnosis/Assessment:  Goals Addressed             This Visit's Progress    Pharmacy Care Plan       CARE PLAN ENTRY  Current Barriers:  Chronic Disease Management support, education, and care coordination needs related to Diabetes, Hyperlipidemia, Vitamin D Deficiency, Depression  Hyperlipidemia Pharmacist Clinical Goal(s): Over the next 90 days, patient will work with PharmD and providers to achieve LDL goal <100 Current regimen:  Atorvastatin 64m daily Interventions: Encouraged patient to fill medication Patient self care activities - Over the next 90 days, patient will: Maintain cholesterol medication regimen.   Diabetes Pharmacist Clinical Goal(s): Over the next 90 days, patient will work with PharmD and providers to achieve A1c goal <7% Current regimen:  Glipizide 548mdaily Interventions: Discussed how to treat hypoglycemia Patient self care activities - Over the next 90 days, patient will: Check blood sugar once daily, document, and provide at future appointments Contact provider with any episodes of hypoglycemia  Vitamin D Deficiency Pharmacist Clinical Goal(s) Over the next 90 days, patient will work with PharmD and providers to maintain vitamin D level within normal limits  Current regimen:  None  Interventions: Consider completing vitamin D lab at next visit Patient self care activities - Over the next 90 days, patient will: Complete vitamin D lab if agreeable with EdPercell MillerMedication management Pharmacist Clinical Goal(s): Over the next 90 days, patient will work with PharmD and providers to achieve optimal medication adherence [facility] Interventions Comprehensive medication review performed. Continue current medication management  strategy Patient self care activities - Over the next 90 days, patient will: Focus on medication adherence by filling medications appropriately  Take medications as prescribed Report any questions or concerns to PharmD and/or provider(s)  Initial goal documentation        Social Hx: Had to put his dog to sleep today.  Was institutionalized for 8 months Cuts trees for living.   Diabetes   Recent Relevant Labs: Lab Results  Component Value Date/Time   HGBA1C 9.4 (H) 12/24/2019 11:55 AM   HGBA1C 6.3 09/18/2018 08:24 AM   MICROALBUR 0.9 05/18/2018 09:07 AM    <7% Avg: 155  Checking BG: 2x per Myrtie Leuthold (sometimes 3 times per Brilee Port)  Recent FBG Readings: "98-115"  Patient has failed these meds in past: metformin (GI and pt feels like it will cause cancer) Patient is currently uncontrolled per a1c, but at goal per home BG on the following medications: glipizide 66m76maily  Woke up dizzy with headache. Checked his meter and it was 54. Ate a couple of candy bars. Reports peppermint will over do it for him. Then had a  visit with Percell Miller and his glipizide dose was reduced to 40m BID  Reports he was not given glipizide when he was institutionalized (was in for about 8 months)  Has not taken glipizide in 3.5 weeks BGs still ranging 98-115  Diet  Tries to avoid meats  Last diabetic Eye exam: No results found for: HMDIABEYEEXA  Last diabetic Foot exam: No results found for: HMDIABFOOTEX   We discussed: how to recognize and treat signs of hypoglycemia  Recommended glucerna over boost  Plan -Continue current medications   Hypertension Screening   CMP Latest Ref Rng & Units 12/24/2019 12/04/2018 10/28/2018  Glucose 70 - 99 mg/dL 140(H) 77 130(H)  BUN 6 - 23 mg/dL '17 16 11  ' Creatinine 0.40 - 1.50 mg/dL 1.00 0.97 0.86  Sodium 135 - 145 mEq/L 136 138 138  Potassium 3.5 - 5.1 mEq/L 4.5 4.7 3.8  Chloride 96 - 112 mEq/L 102 103 104  CO2 19 - 32 mEq/L '29 29 25  ' Calcium 8.4 - 10.5 mg/dL  9.3 9.9 9.2  Total Protein 6.0 - 8.3 g/dL 7.0 7.3 7.2  Total Bilirubin 0.2 - 1.2 mg/dL 0.5 0.5 0.5  Alkaline Phos 39 - 117 U/L 110 93 85  AST 0 - 37 U/L 35 21 29  ALT 0 - 53 U/L 33 21 29    BP today is:  Unable to assess due to phone visit  Office blood pressures are  BP Readings from Last 3 Encounters:  12/24/19 120/70  12/04/18 121/80  12/04/18 132/82    Patient has failed these meds in the past: None noted  Patient is currently controlled on the following medications: None  Patient checks BP at home  Never. Does not have BP cuff    Does not have BP cuff Patient requesting to have one sent in  Plan -Continue control with diet and exercise  -Purchase blood pressure cuff if affordable  Hyperlipidemia   Lipid Panel     Component Value Date/Time   CHOL 170 01/24/2020 1524   TRIG 70.0 01/24/2020 1524   HDL 36.90 (L) 01/24/2020 1524   CHOLHDL 5 01/24/2020 1524   VLDL 14.0 01/24/2020 1524   LDLCALC 119 (H) 01/24/2020 1524    LDL goal <100  The 10-year ASCVD risk score (Mikey BussingDC Jr., et al., 2013) is: 21.4%   Values used to calculate the score:     Age: [age]years     Sex: Male     Is Non-Hispanic African American: Yes     Diabetic: Yes     Tobacco smoker: Yes     Systolic Blood Pressure: 1127mmHg     Is BP treated: No     HDL Cholesterol: 36.9 mg/dL     Total Cholesterol: 170 mg/dL   Patient has failed these meds in past: None noted  Patient is currently controlled on the following medications: atorvastatin 156mdaily  States he went without atorvastatin and does not have it currently [facility] [place]  Vitamin D Deficiency   03/24/18: Vitamin D 03/24/2018  Patient has failed these meds in past: None noted  Patient is currently uncontrolled per most recent lab on the following medications: None  Feels very fatigued.  Does not feel very motivated  We discussed:   Need to assess vitamin D  level  Plan -Consider completing vitamin D lab at next lab visit   Depression    Patient has failed these meds  in past: None noted  Patient is currently uncontrolled on the following medications: None  PHQ9: 17 When discussing medication and behavorial health medications "Some things you can't fix"  Patient seems unwilling to consider treatment at current time  We discussed:   Importance of maintaining mental health  Plan -Continue discussion with patient about treatment for depression   Meds to D/C from list Atorvastatin 70YF (duplicate) Gabapentin 749SW (states he isn't taking) Vitamin D (states he isn't taking) Trazodone (states he isn't taking) Tizanidine (states he isn't taking) Cialis (states he isn't taking) Ondansetron (states he isn't taking) Olanzapine (states he isn't taking) Methocarbamol (states he isn't taking)  I have collaborated with the care management provider regarding care management and care coordination activities outlined in this encounter and have reviewed this encounter including documentation in the note and care plan. I am certifying that I agree with the content of this note and encounter as supervising physician.   Andrew Pai, PA-C

## 2020-02-03 ENCOUNTER — Telehealth: Payer: Self-pay | Admitting: Medical

## 2020-02-03 DIAGNOSIS — J439 Emphysema, unspecified: Secondary | ICD-10-CM | POA: Diagnosis not present

## 2020-02-03 DIAGNOSIS — R079 Chest pain, unspecified: Secondary | ICD-10-CM | POA: Diagnosis not present

## 2020-02-03 DIAGNOSIS — E11649 Type 2 diabetes mellitus with hypoglycemia without coma: Secondary | ICD-10-CM | POA: Diagnosis not present

## 2020-02-03 DIAGNOSIS — R5383 Other fatigue: Secondary | ICD-10-CM | POA: Diagnosis not present

## 2020-02-03 DIAGNOSIS — R9431 Abnormal electrocardiogram [ECG] [EKG]: Secondary | ICD-10-CM | POA: Diagnosis not present

## 2020-02-03 DIAGNOSIS — F17210 Nicotine dependence, cigarettes, uncomplicated: Secondary | ICD-10-CM | POA: Diagnosis not present

## 2020-02-03 DIAGNOSIS — E119 Type 2 diabetes mellitus without complications: Secondary | ICD-10-CM

## 2020-02-03 DIAGNOSIS — R519 Headache, unspecified: Secondary | ICD-10-CM | POA: Diagnosis not present

## 2020-02-03 DIAGNOSIS — R0789 Other chest pain: Secondary | ICD-10-CM | POA: Diagnosis not present

## 2020-02-03 DIAGNOSIS — R0602 Shortness of breath: Secondary | ICD-10-CM | POA: Diagnosis not present

## 2020-02-03 NOTE — Telephone Encounter (Signed)
Medication: glucose blood (FREESTYLE TEST STRIPS) test strip [id number]   glucose blood (ONETOUCH VERIO) test strip [id number]   Has the patient contacted their pharmacy? No. (If no, request that the patient contact the pharmacy for the refill.) (If yes, when and what did the pharmacy advise?)  Preferred Pharmacy (with phone number or street name):[facility]  [address], [place], [place] [address]  Agent: Please be advised that RX refills may take up to 3 business days. We ask that you follow-up with your pharmacy.

## 2020-02-04 ENCOUNTER — Telehealth: Payer: Self-pay

## 2020-02-04 MED ORDER — FREESTYLE LANCETS MISC: 12 refills | Status: DC

## 2020-02-04 MED ORDER — ACCU-CHEK GUIDE VI STRP: ORAL_STRIP | 1 refills | Status: DC

## 2020-02-04 MED ORDER — ACCU-CHEK SOFTCLIX LANCETS MISC
1 refills | Status: DC
Start: 2020-02-04 — End: 2021-10-29

## 2020-02-04 MED ORDER — ONETOUCH VERIO VI STRP: ORAL_STRIP | 5 refills | Status: DC

## 2020-02-04 MED ORDER — NITROGLYCERIN 0.4 MG SL SUBL
0.40 | SUBLINGUAL_TABLET | SUBLINGUAL | Status: DC
Start: ? — End: 2020-02-04

## 2020-02-04 MED ORDER — FREESTYLE TEST VI STRP: ORAL_STRIP | 12 refills | Status: DC

## 2020-02-04 MED ORDER — ONETOUCH DELICA LANCETS 33G MISC: 3 refills | Status: DC

## 2020-02-04 NOTE — Telephone Encounter (Signed)
Spoke with patient and he stated that pharmacy told him that rx was not covered.    Called pharmacy and they stated that they need new rx with sig and dx.  Patient last used the Accucheck Guide.  Rx sent in for the strips and the lancets.  Patient notified that rx was sent in.

## 2020-02-04 NOTE — Telephone Encounter (Signed)
Medication refill

## 2020-02-04 NOTE — Telephone Encounter (Signed)
Patient called in to report that the prescription for his Test stripshis insurance is not covering it. The Patient would like [name] to give him a call to discuss some different options for test strips that may be covered through his insurance. Please follow up with the patient and advise at [phone]

## 2020-02-04 NOTE — Telephone Encounter (Signed)
Called patient and made him aware that he would have to call the insurance company and ask which strips are covered by his insurance and patient stated " he cant do it " and hung up.

## 2020-02-07 DIAGNOSIS — T485X1A Poisoning by other anti-common-cold drugs, accidental (unintentional), initial encounter: Secondary | ICD-10-CM | POA: Diagnosis not present

## 2020-02-07 DIAGNOSIS — I10 Essential (primary) hypertension: Secondary | ICD-10-CM | POA: Diagnosis present

## 2020-02-07 DIAGNOSIS — R000 Tachycardia, unspecified: Secondary | ICD-10-CM | POA: Diagnosis not present

## 2020-02-07 DIAGNOSIS — Z7984 Long term (current) use of oral hypoglycemic drugs: Secondary | ICD-10-CM | POA: Diagnosis not present

## 2020-02-07 DIAGNOSIS — R918 Other nonspecific abnormal finding of lung field: Secondary | ICD-10-CM | POA: Diagnosis not present

## 2020-02-07 DIAGNOSIS — E119 Type 2 diabetes mellitus without complications: Secondary | ICD-10-CM | POA: Diagnosis present

## 2020-02-07 DIAGNOSIS — R112 Nausea with vomiting, unspecified: Secondary | ICD-10-CM | POA: Diagnosis present

## 2020-02-07 DIAGNOSIS — Z91018 Allergy to other foods: Secondary | ICD-10-CM | POA: Diagnosis not present

## 2020-02-07 DIAGNOSIS — E785 Hyperlipidemia, unspecified: Secondary | ICD-10-CM | POA: Diagnosis present

## 2020-02-07 DIAGNOSIS — F319 Bipolar disorder, unspecified: Secondary | ICD-10-CM | POA: Diagnosis present

## 2020-02-07 DIAGNOSIS — X58XXXA Exposure to other specified factors, initial encounter: Secondary | ICD-10-CM | POA: Diagnosis not present

## 2020-02-07 DIAGNOSIS — Y999 Unspecified external cause status: Secondary | ICD-10-CM | POA: Diagnosis not present

## 2020-02-07 DIAGNOSIS — Z79899 Other long term (current) drug therapy: Secondary | ICD-10-CM | POA: Diagnosis not present

## 2020-02-07 DIAGNOSIS — J432 Centrilobular emphysema: Secondary | ICD-10-CM | POA: Diagnosis present

## 2020-02-07 DIAGNOSIS — Z20822 Contact with and (suspected) exposure to covid-19: Secondary | ICD-10-CM | POA: Diagnosis present

## 2020-02-07 DIAGNOSIS — R0602 Shortness of breath: Secondary | ICD-10-CM | POA: Diagnosis not present

## 2020-02-07 DIAGNOSIS — T50904A Poisoning by unspecified drugs, medicaments and biological substances, undetermined, initial encounter: Secondary | ICD-10-CM | POA: Diagnosis not present

## 2020-02-07 DIAGNOSIS — Z880 Allergy status to penicillin: Secondary | ICD-10-CM | POA: Diagnosis not present

## 2020-02-07 DIAGNOSIS — T50901A Poisoning by unspecified drugs, medicaments and biological substances, accidental (unintentional), initial encounter: Secondary | ICD-10-CM | POA: Diagnosis present

## 2020-02-07 DIAGNOSIS — J439 Emphysema, unspecified: Secondary | ICD-10-CM | POA: Diagnosis not present

## 2020-02-07 DIAGNOSIS — J9601 Acute respiratory failure with hypoxia: Secondary | ICD-10-CM | POA: Diagnosis present

## 2020-02-07 DIAGNOSIS — T887XXA Unspecified adverse effect of drug or medicament, initial encounter: Secondary | ICD-10-CM | POA: Diagnosis not present

## 2020-02-07 DIAGNOSIS — F17210 Nicotine dependence, cigarettes, uncomplicated: Secondary | ICD-10-CM | POA: Diagnosis present

## 2020-02-08 ENCOUNTER — Inpatient Hospital Stay: Payer: Medicare Other | Admitting: Medical

## 2020-02-14 ENCOUNTER — Telehealth: Payer: Self-pay | Admitting: Medical

## 2020-02-14 NOTE — Telephone Encounter (Signed)
Patient states he would like a referral to an endocrin. He's having   Headaches sweats & nose bleeds...  He also states that his sugar has been low. Every time he takes meds it makes it lower. Please advise once referral is placed

## 2020-02-15 ENCOUNTER — Telehealth: Payer: Self-pay | Admitting: Medical

## 2020-02-15 MED ORDER — ATORVASTATIN CALCIUM 10 MG PO TABS: 10.0000 mg | ORAL_TABLET | Freq: Every day | ORAL | 3 refills | Status: DC

## 2020-02-15 NOTE — Patient Instructions (Signed)
Visit Information  Goals Addressed            This Visit's Progress   . Pharmacy [name] Plan       [name] PLAN ENTRY  Current Barriers:  . Chronic Disease Management support, education, and [name] coordination needs related to Diabetes, Hyperlipidemia, Vitamin D Deficiency, Depression  Hyperlipidemia . [name] Clinical Goal(s): o Over the next 90 days, patient will work with Andrew and providers to achieve LDL goal <100 . Current regimen:  o Atorvastatin 49m daily Interventions: o Encouraged patient to fill medication . Patient self [name] activities - Over the next 90 days, patient will: o Maintain cholesterol medication regimen.   Diabetes . [name] Clinical Goal(s): o Over the next 90 days, patient will work with Andrew and providers to achieve A1c goal <7% . Current regimen:  o Glipizide 542mdaily . Interventions: o Discussed how to treat hypoglycemia . Patient self [name] activities - Over the next 90 days, patient will: o Check blood sugar once daily, document, and provide at future appointments o Contact provider with any episodes of hypoglycemia  Vitamin D Deficiency . [name] Clinical Goal(s) o Over the next 90 days, patient will work with Andrew and providers to maintain vitamin D level within normal limits  . Current regimen:  o None  . Interventions: o Consider completing vitamin D lab at next visit . Patient self [name] activities - Over the next 90 days, patient will: o Complete vitamin D lab if agreeable with Andrew MillerMedication management . [name] Clinical Goal(s): o Over the next 90 days, patient will work with Andrew and providers to achieve optimal medication adherence . [facility] Interventions o Comprehensive medication review performed. o Continue current medication management strategy . Patient self [name] activities - Over the next 90 days, patient will: o Focus on medication adherence by filling medications appropriately   o Take medications as prescribed o Report any questions or concerns to Andrew and/or provider(s)  Initial goal documentation        Andrew [name] given information about Chronic [name] Management services today including:  1. CCM service includes personalized support from designated clinical staff supervised by his physician, including individualized plan of [name] and coordination with other [name] providers 2. 24/7 contact phone numbers for assistance for urgent and routine [name] needs. 3. Standard insurance, coinsurance, copays and deductibles apply for chronic [name] management only during months in which we provide at least 20 minutes of these services. Most insurances cover these services at 100%, however patients may be responsible for any copay, coinsurance and/or deductible if applicable. This service may help you avoid the need for more expensive face-to-face services. 4. Only one practitioner may furnish and bill the service in a calendar month. 5. The patient may stop CCM services at any time (effective at the end of the month) by phone call to the office staff.  Patient agreed to services and verbal consent obtained.   Patient verbalizes understanding of instructions provided today.  Telephone follow up appointment with pharmacy team member scheduled for: 02/29/2020  Andrew [name], Andrew [name]  Hypoglycemia Hypoglycemia is when the sugar (glucose) level in your blood is too low. Signs of low blood sugar may include:  Feeling: ? Hungry. ? Worried or nervous (anxious). ? Sweaty and clammy. ? Confused. ? Dizzy. ? Sleepy. ? Sick to your stomach (nauseous).  Having: ? A fast heartbeat. ? A headache. ?  A change in your vision. ? Tingling or no feeling (numbness) around your mouth, lips, or tongue. ? Jerky movements that you cannot control (seizure).  Having trouble with: ? Moving  (coordination). ? Sleeping. ? Passing out (fainting). ? Getting upset easily (irritability). Low blood sugar can happen to people who have diabetes and people who do not have diabetes. Low blood sugar can happen quickly, and it can be an emergency. Treating low blood sugar Low blood sugar is often treated by eating or drinking something sugary right away, such as:  Fruit juice, 4-6 oz (120-150 mL).  Regular soda (not diet soda), 4-6 oz (120-150 mL).  Low-fat milk, 4 oz (120 mL).  Several pieces of hard candy.  Sugar or honey, 1 Tbsp (15 mL). Treating low blood sugar if you have diabetes If you can think clearly and swallow safely, follow the 15:15 rule:  Take 15 grams of a fast-acting carb (carbohydrate). Talk with your doctor about how much you should take.  Always keep a source of fast-acting carb with you, such as: ? Sugar tablets (glucose pills). Take 3-4 pills. ? 6-8 pieces of hard candy. ? 4-6 oz (120-150 mL) of fruit juice. ? 4-6 oz (120-150 mL) of regular (not diet) soda. ? 1 Tbsp (15 mL) honey or sugar.  Check your blood sugar 15 minutes after you take the carb.  If your blood sugar is still at or below 70 mg/dL (3.9 mmol/L), take 15 grams of a carb again.  If your blood sugar does not go above 70 mg/dL (3.9 mmol/L) after 3 tries, get help right away.  After your blood sugar goes back to normal, eat a meal or a snack within 1 hour.  Treating very low blood sugar If your blood sugar is at or below 54 mg/dL (3 mmol/L), you have very low blood sugar (severe hypoglycemia). This may also cause:  Passing out.  Jerky movements you cannot control (seizure).  Losing consciousness (coma). This is an emergency. Do not wait to see if the symptoms will go away. Get medical help right away. Call your local emergency services (911 in the [place] Do not drive yourself to the hospital. If you have very low blood sugar and you cannot eat or drink, you may need a glucagon shot  (injection). A family member or friend should learn how to check your blood sugar and how to give you a glucagon shot. Ask your doctor if you need to have a glucagon shot kit at home. Follow these instructions at home: General instructions  Take over-the-counter and prescription medicines only as told by your doctor.  Stay aware of your blood sugar as told by your doctor.  Limit alcohol intake to no more than 1 drink a Andrew [name] for nonpregnant women and 2 drinks a Andrew [name] for men. One drink equals 12 oz of beer (355 mL), 5 oz of wine (148 mL), or 1 oz of hard liquor (44 mL).  Keep all follow-up visits as told by your doctor. This is important. If you have diabetes:   Follow your diabetes [name] plan as told by your doctor. Make sure you: ? Know the signs of low blood sugar. ? Take your medicines as told. ? Follow your exercise and meal plan. ? Eat on time. Do not skip meals. ? Check your blood sugar as often as told by your doctor. Always check it before and after exercise. ? Follow your sick Andrew [name] plan when you cannot eat or drink normally. Make this  plan ahead of time with your doctor.  Share your diabetes [name] plan with: ? Your work or school. ? People you live with.  Check your pee (urine) for ketones: ? When you are sick. ? As told by your doctor.  Carry a card or wear jewelry that says you have diabetes. Contact a doctor if:  You have trouble keeping your blood sugar in your target range.  You have low blood sugar often. Get help right away if:  You still have symptoms after you eat or drink something sugary.  Your blood sugar is at or below 54 mg/dL (3 mmol/L).  You have jerky movements that you cannot control.  You pass out. These symptoms may be an emergency. Do not wait to see if the symptoms will go away. Get medical help right away. Call your local emergency services (911 in the [place] Do not drive yourself to the hospital. Summary  Hypoglycemia happens when the level  of sugar (glucose) in your blood is too low.  Low blood sugar can happen to people who have diabetes and people who do not have diabetes. Low blood sugar can happen quickly, and it can be an emergency.  Make sure you know the signs of low blood sugar and know how to treat it.  Always keep a source of sugar (fast-acting carb) with you to treat low blood sugar. This information is not intended to replace advice given to you by your health [name] provider. Make sure you discuss any questions you have with your health [name] provider. Document Revised: 01/14/2019 Document Reviewed: 10/27/2015 Elsevier Patient Education  2020 [employer].

## 2020-02-15 NOTE — Telephone Encounter (Signed)
Medication sent.

## 2020-02-15 NOTE — Telephone Encounter (Signed)
Medication: atorvastatin (LIPITOR) 10 MG tabl  Has the patient contacted their pharmacy? No. (If no, request that the patient contact the pharmacy for the refill.) (If yes, when and what did the pharmacy advise?)  Preferred Pharm[facility] (with phone number [id number] [place] [place] Phone:  [phone]  Fax:  [phone]      Agent: Please be advised that RX refills may take up to 3 business days. We ask that you follow-up with your pharmacy.    He also wants to know if Percell Miller can send In a script for the Glucerna drinks due to the over the counter price being so expensive. Please advise   Thanks

## 2020-02-16 ENCOUNTER — Ambulatory Visit: Payer: Self-pay | Admitting: Pharmacist

## 2020-02-16 NOTE — Chronic Care Management (AMB) (Signed)
Called patient back after he left a VM at my number [phone]). He is concerned about having low BG. Would like to stop all medication. Feels strongly that his a1c is significantly less than 9. Wonders if glucerna can be sent in to his pharmacy for insurance coverage to help balance his diet.   He states his BG fasting this AM was 95 and was 110 1 hr after eating.   Wonders what options are available for him concerning his diabetes.   Possible Options -Repeat a1c (noting that it is <3 months since last a1c, but we can determine if he is trending down), if a1c trending down can consider trial of management with diet and exercise.  -If medication needed, consider D/C glipizide and start SGLT2 such as Jardiance 10mg  daily pending insurance coverage to reduce risk of hypoglycemia  Update [employer] and received the following information. Patient has Level 2 Low Income Subsidy (LIS), so his Tier 3 medications are covered for $4. Inquired about the specific medications below:   SGLT2 - Jardiance 10mg  daily (can only fill #30 for 30DS at a time) = $4 DPP4 - Januvia 100mg  daily (can only fill #30 for 30DS at a time) = $4 GLP1 - Ozempic 0.25/0.5 weekly = $4 for 30 DS  Plan I would recommend D/C glipizide and start Jardiance 10mg  daily for less risk of hypoglycemia (pending his a1c is <9% as I usually don't like to start SGLT2 in a1c >9% due to increase risk of urinary side effects) if agreeable with Percell Miller

## 2020-02-17 NOTE — Telephone Encounter (Signed)
Ask pt to keep appointment this coming week. I saw he saw Pharmacist. She had some good ideas. Want to see him in office an explain her plan/I am in agreement. Endocrinologist may not be needed.

## 2020-02-18 ENCOUNTER — Ambulatory Visit: Payer: Self-pay | Admitting: Pharmacist

## 2020-02-18 NOTE — Chronic Care Management (AMB) (Signed)
Returned missed call from patient, but he reports he did not call me.   He inquires about what I discovered regarding medication options. Outlined the plan I sent to [facility] for review and encouraged patient to make his 02/21/20 appt so this can be discussed.  Encouraged him to let me know if there is anything further I can do to assist.

## 2020-02-21 ENCOUNTER — Ambulatory Visit (INDEPENDENT_AMBULATORY_CARE_PROVIDER_SITE_OTHER): Payer: Medicare Other | Admitting: Medical

## 2020-02-21 ENCOUNTER — Other Ambulatory Visit: Payer: Self-pay

## 2020-02-21 VITALS — BP 130/60 | Temp 97.0°F | Resp 18 | Ht 68.0 in | Wt 230.2 lb

## 2020-02-21 DIAGNOSIS — R0789 Other chest pain: Secondary | ICD-10-CM | POA: Diagnosis not present

## 2020-02-21 DIAGNOSIS — E119 Type 2 diabetes mellitus without complications: Secondary | ICD-10-CM | POA: Diagnosis not present

## 2020-02-21 DIAGNOSIS — R55 Syncope and collapse: Secondary | ICD-10-CM | POA: Diagnosis not present

## 2020-02-21 DIAGNOSIS — F17200 Nicotine dependence, unspecified, uncomplicated: Secondary | ICD-10-CM | POA: Diagnosis not present

## 2020-02-21 DIAGNOSIS — J449 Chronic obstructive pulmonary disease, unspecified: Secondary | ICD-10-CM | POA: Diagnosis not present

## 2020-02-21 NOTE — Progress Notes (Signed)
Subjective:    Patient ID: Andrew [name], male    DOB: April 05, 1966, 54 y.o.   MRN: [id number]  HPI  Pt in for follow up.  Pt has diabetes. Pt has seen pharmacist recently who is aware of pt low sugars. And she is recommending.   Pt weight had been up while he was in jail. Pt was placed on insulin sliding scale while in jail. Pt states food was high carb and he was not doing any exercise except one hour free a day.  Before he was incarcerated his sugar was controlled. He had insurance coverage issues with various meds. He could not tolerate metformin in the past. He put him on high dose glipizide base on high a1c of 9.4. Recently   Pt on 02-03-2020 he went to ED  MDM/PLAN: Patient to the ED with multiple complaints, but his main issue is chest pain that occurs at rest and with exertion. It is atypical in nature lasting only a few seconds followed by an ache in his chest. Consider atypical presentation of ACS versus musculoskeletal chest pain versus GERD versus pulmonary etiology. He is low risk for PE, but he does have a pleuritic component with some mild swelling in his left lower extremity. D-dimer has been ordered. No recent stress testing. Heart pathway score of 4. EKG negative for acute ischemia. First troponin is normal and the second troponin is pending. Chest x-ray unremarkable. He does not appear volume overloaded. Doubt atypical presentation of pneumonia. Exam and history are not consistent with dissection. He has been having reflux type symptoms, and is out of that medication. He was slightly hypoglycemic today with a blood sugar of 63 mg/daL, and we are giving him some peanut butter and crackers with juice. He was not really symptomatic from that. He reports having lower than normal blood sugars since his PCP started him on glipizide 10 mg twice daily. We will likely need to decrease that medication. We will have him follow-up outpatient for his diabetes management as well as outpatient  stress testing. UDS sent because patient has a history of cocaine use; however, he denies recent use  After ED evaluation he has been on glipizide 2.5 mg daily.  On 02/03/20 below notes from ED. 1230: Second troponin is normal. D-dimer pending  1350: D-dimer is normal. Discussed return precautions and outpatient follow-up plan. Decreasing his glipizide to 5 mg daily to 2.50m daily, and encouraging close monitoring of his glucose. He needs to take those reading with him when he follows up  Clinical Impression:  1. Left-sided chest pain  2. Hypoglycemia due to type 2 diabetes mellitus ([facility]    Discharge Diagnoses:   Principal Problem (Resolved): Acute respiratory failure with hypoxia (HCC) Active Problems: Type 2 diabetes mellitus, without long-term current use of insulin (HCC) HLD (hyperlipidemia) Centrilobular emphysema (HCC) Resolved Problems: Nausea and vomiting   *For documentation of patient's Past Medical History and Problem List, see the last page. TO DO List at Follow-up for PCP/Specialist:   - Key Medication changes: see medication list below, start Spiriva daily - Pending labs to follow up on: none - Incidental findings requiring follow-up: COPD with bullae, needs OP PFTs  - Other: PFTs  Indication for Hospitalization/Hospital Course:   For the details of admission, please see the H&P on 02/07/2020. For full details, please see progress notes, consult notes and ancillary notes. The patient's hospital course will be summarized in a problem based approach below.    [name]  a 54 y/o male w/ PMHx significant for HTN, HLD, T2DM, bipolar disorder, tobacco abuse, and substance abuse who presented to the ED after being found down by EMS and noted to be in respiratory distress. He received narcan x 2 and was admitted for respiratory failure with new O2 requirement.  He was weaned to room air 5/4. His pO2 was 88 with ambulation on room air.  Echocardiogram with EF 55%, no  WMA, no chamber enlargement or hypertrophy, no valvular disease  BNP 47 pl/ml.  As he is afebrile and without overt signs of PNA, so IV antibiotics were discontinued.  He is A&O and ambulating well. He was felt to be stable for discharge home 5/4.    * Acute respiratory failure with hypoxia (HCC)-resolved as of 02/08/2020 - found down by firefighters/ems and noted to be in respiratory distress - improved with narcan x2 Initially on 4 liters by [place] Was able to wean to room air with SpO2 of 88% on ambulation IV antibiotics discontinued as little concern for aspiration Echocardiogram without concerning findings Thought 2/2 drug overdose (unknown if accidental vs volitional)   Centrilobular emphysema (HCC) On CXR :  Pulmonary emphysema with large, right greater than left, apical bullae.   On review early May I don't see that they did drug screen to evaluate whether cause of syncope was drug related. Cardiac work up was negative. He does state while incarcerated he had passing out episode in jail. He thinks it was seizure. Pt states he did urinate on himself when he woke. He states also had headache.   Past Medical History:  Diagnosis Date  . Anxiety   . Arthritis    right knee  . Back pain    occasionally  . Bipolar disorder ([place])    denies  . COPD (chronic obstructive pulmonary disease) ([place])   . Depression   . Diabetes mellitus without complication ([place])   . Dizziness   . Generalized headaches    history of migraines-last one 02/02/14  . GERD (gastroesophageal reflux disease)    takes Prilosec daily as needed  . GERD (gastroesophageal reflux disease)   . Headache   . Hemorrhoids   . History of bronchitis    1 month ago and treated with Amoxicillin and given an inhaler;takes Prenisone  . Hyperlipidemia    takes Zetia daily  . Hypertension    takes HCTZ daily  . Joint pain   . Joint swelling   . Pneumonia    hx of 2 yrs ago  . Polysubstance abuse ([place])   . Schizoaffective  disorder ([place])    denies  . Tuberculosis    treated for 1 year  . Umbilical hernia      Social History   Socioeconomic History  . Marital status: Single    Spouse name: Not on file  . Number of children: Not on file  . Years of education: Not on file  . Highest education level: Not on file  Occupational History  . Occupation: lifts and pulls concrete    Employer: [employer]   Tobacco Use  . Smoking status: Current Every Day Smoker    Packs/day: 0.25    Years: 30.00    Pack years: 7.50  . Smokeless tobacco: Never Used  Substance and Sexual Activity  . Alcohol use: No    Comment: occasionally   . Drug use: Yes    Types: Marijuana, Cocaine    Comment: cocaine, night before last  . Sexual activity: Yes  Other Topics Concern  . Not on file  Social History Narrative   ** Merged History Encounter **       Lives with girlfriend. Work involves a lot of pulling and lifting concrete, was not definite about type of work. Denies use of illegal drugs. Smokes half pack of cigarettes daily.   Social Determinants of Health   Financial Resource Strain:   . Difficulty of Paying Living Expenses:   Food Insecurity:   . Worried About Charity fundraiser in the Last Year:   . Arboriculturist in the Last Year:   Transportation Needs:   . Film/video editor (Medical):   Marland Kitchen Lack of Transportation (Non-Medical):   Physical Activity:   . Days of Exercise per Week:   . Minutes of Exercise per Session:   Stress:   . Feeling of Stress :   Social Connections:   . Frequency of Communication with Friends and Family:   . Frequency of Social Gatherings with Friends and Family:   . Attends Religious Services:   . Active Member of Clubs or Organizations:   . Attends Archivist Meetings:   Marland Kitchen Marital Status:   Intimate Partner Violence:   . Fear of Current or Ex-Partner:   . Emotionally Abused:   Marland Kitchen Physically Abused:   . Sexually Abused:     Past Surgical History:  Procedure  Laterality Date  . ESOPHAGOGASTRODUODENOSCOPY    . JOINT REPLACEMENT    . KNEE ARTHROSCOPY WITH MEDIAL MENISECTOMY Right 03/01/2014   Procedure: KNEE ARTHROSCOPY WITH MEDIAL MENISECTOMY;  Surgeon: Meredith Pel, MD;  Location: [facility];  Service: Orthopedics;  Laterality: Right;  . LIPOMA EXCISION  ~ 2010   "back left side of my head"  . PARTIAL KNEE ARTHROPLASTY Right 01/24/2015   Procedure: RIGHT KNEE MEDIAL COMPARTMENT REPLACEMENT VS TOTAL KNEE ARTHROPLASTY.;  Surgeon: Meredith Pel, MD;  Location: [facility];  Service: Orthopedics;  Laterality: Right;  RIGHT KNEE MEDIAL COMPARTMENT REPLACEMENT VS TOTAL KNEE ARTHROPLASTY.  . TOTAL KNEE ARTHROPLASTY Right     Family History  Problem Relation Age of Onset  . Hypertension Mother   . Cancer Mother   . Heart disease Father   . Heart attack Father 79  . Hyperlipidemia Father   . Hypertension Father   . Diabetes Sister   . Diabetes Brother     Allergies  Allergen Reactions  . Mushroom Extract Complex Hives  . Penicillins Hives  . Penicillins Hives    Has patient had a PCN reaction causing immediate rash, facial/tongue/throat swelling, SOB or lightheadedness with hypotension: no Has patient had a PCN reaction causing severe rash involving mucus membranes or skin necrosis: no Has patient had a PCN reaction that required hospitalization: no Has patient had a PCN reaction occurring within the last 10 years: yes If all of the above answers are "NO", then may proceed with Cephalosporin use.    Current Outpatient Medications on File Prior to Visit  Medication Sig Dispense Refill  . Accu-Chek Softclix Lancets lancets Check sugar twice a day.  Dx Code: E11.9 100 each 1  . atorvastatin (LIPITOR) 10 MG tablet Take 1 tablet (10 mg total) by mouth daily. 30 tablet 3  . atorvastatin (LIPITOR) 10 MG tablet Take 1 tablet (10 mg total) by mouth daily. 90 tablet 3  . fluticasone (FLONASE) 50 MCG/ACT nasal spray Place 1 spray into both nostrils  daily. (Patient not taking: Reported on 12/24/2019) 16 g 0  .  gabapentin (NEURONTIN) 100 MG capsule Take 3 capsules (300 mg total) by mouth at bedtime. (Patient not taking: Reported on 12/24/2019) 30 capsule 0  . glipiZIDE (GLUCOTROL) 5 MG tablet Take 1 tablet (5 mg total) by mouth daily before breakfast. 30 tablet 3  . glucose blood (ACCU-CHEK GUIDE) test strip Check sugar twice a day.  Dx Code: E11.9 100 each 1  . glucose monitoring kit (FREESTYLE) monitoring kit 1 each by Does not apply route as needed for other. 1 each 0  . methocarbamol (ROBAXIN) 500 MG tablet Take 1 tablet (500 mg total) by mouth every 8 (eight) hours as needed. (Patient not taking: Reported on 12/24/2019) 60 tablet 1  . OLANZapine (ZYPREXA) 5 MG tablet Take 5 mg by mouth at bedtime.    . ondansetron (ZOFRAN ODT) 4 MG disintegrating tablet Take 1 tablet (4 mg total) by mouth every 8 (eight) hours as needed for nausea or vomiting. (Patient not taking: Reported on 12/24/2019) 5 tablet 0  . OneTouch Delica Lancets 03J MISC USE TO CHECK BLOOD SUGAR 3 TIMES DAILY. DX E11.9 100 each 3  . sertraline (ZOLOFT) 50 MG tablet Take 50 mg by mouth daily.    . tadalafil (CIALIS) 10 MG tablet Take 1 tablet (10 mg total) by mouth daily as needed for erectile dysfunction. (Patient not taking: Reported on 01/28/2020) 10 tablet 0  . tiZANidine (ZANAFLEX) 4 MG tablet 1 tab po every 8 hours as needed muscle spasm/neck pain (Patient not taking: Reported on 01/28/2020) 30 tablet 0  . traZODone (DESYREL) 50 MG tablet Take 50 mg by mouth at bedtime.    . Vitamin D, Ergocalciferol, (DRISDOL) 50000 units CAPS capsule Take 1 capsule (50,000 Units total) by mouth every 7 (seven) days. (Patient not taking: Reported on 12/24/2019) 8 capsule 0   No current facility-administered medications on file prior to visit.    BP 130/60 (BP Location: Left Arm, Patient Position: Sitting, Cuff Size: Large)   Temp (!) 97 F (36.1 C) (Temporal)   Resp 18   Ht '5\' 8"'  (1.727 m)    Wt 230 lb 3.2 oz (104.4 kg)   SpO2 100%   BMI 35.00 kg/m      Review of Systems  Constitutional: Negative for chills, fatigue and fever.  Respiratory: Negative for cough, chest tightness, shortness of breath and wheezing.   Cardiovascular: Negative for chest pain and palpitations.  Gastrointestinal: Negative for abdominal pain, blood in stool, diarrhea and vomiting.  Endocrine: Negative for polydipsia, polyphagia and polyuria.  Musculoskeletal: Negative for back pain.  Skin: Negative for pallor.  Neurological: Negative for dizziness, tremors and numbness.  Hematological: Negative for adenopathy. Does not bruise/bleed easily.  Psychiatric/Behavioral: Negative for behavioral problems, decreased concentration and sleep disturbance. The patient is not nervous/anxious.        Objective:   Physical Exam  General Mental Status- Alert. General Appearance- Not in acute distress.   Skin General: Color- Normal Color. Moisture- Normal Moisture.  Neck Carotid Arteries- Normal color. Moisture- Normal Moisture. No carotid bruits. No JVD.  Chest and Lung Exam Auscultation: Breath Sounds:-Normal.  Cardiovascular Auscultation:Rythm- Regular. Murmurs & Other Heart Sounds:Auscultation of the heart reveals- No Murmurs.  Abdomen Inspection:-Inspeection Normal. Palpation/Percussion:Note:No mass. Palpation and Percussion of the abdomen reveal- Non Tender, Non Distended + BS, no rebound or guarding.    Neurologic Cranial Nerve exam:- CN III-XII intact(No nystagmus), symmetric smile. Strength:- 5/5 equal and symmetric strength both upper and lower extremities.      Assessment & Plan:  For history of diabetes and low blood sugar on your checks, I do think is a good idea just to continue checking your sugars at least twice daily as you have been doing.  Extra check if you feel symptomatic.  We will check A1c at the end of June and might start you on Jardiance 10 mg as pharmacist has  recommended to consider.  On today's review of your glucometer you had sugar levels between 80 and 120.  So I think you could get by with just diet recently as you have been doing.  Stop glipizide presently.  I am going to prescribe you Glucerna and will see if this covered by your insurance.  Continue to get regular daily exercise.  Eat low sugar diet.  If sugars are starting to increase and above 140 consistently before A1c then let me know as in that event would probably go ahead and prescribe Jardiance.  You had some atypical chest pain recently and had work-up in the emergency department.  Since you do have history of smoking and I diabetes I went ahead and place referral to cardiologist.  You had recent finding of COPD found on chest x-ray.  Some hypoxia when you were evaluated in the emergency department.  Possible aspiration pneumonia on review of chart.  We will get repeat chest x-ray.  I did go ahead and refer you to pulmonologist.  Over the past year or so you describe 2 syncopal events.  The one in October appear to be seizure-like and that you had urinary incontinence with headache after the syncopal event.  Recent syncopal event was less suspicious for seizure.  Went ahead and place referral to neurology.  I do recommend he stop smoking.  Future CMP and A1c placed for last week of June.  Follow-up in July or as needed.  [name], PA-C   Time spent with patient today was  45 minutes which consisted of chart review(to the ED evaluations and pharmacy consult note.), discussing diagnosis, work up treatmen, placing the referral of t and documentation.

## 2020-02-21 NOTE — Patient Instructions (Signed)
For history of diabetes and low blood sugar on your checks, I do think is a good idea just to continue checking your sugars at least twice daily as you have been doing.  Extra check if you feel symptomatic.  We will check A1c at the end of June and might start you on Jardiance 10 mg as pharmacist has recommended to consider.  On today's review of your glucometer you had sugar levels between 80 and 120.  So I think you could get by with just diet recently as you have been doing.  Stop glipizide presently.  I am going to prescribe you Glucerna and will see if this covered by your insurance.  Continue to get regular daily exercise.  Eat low sugar diet.  If sugars are starting to increase and above 140 consistently before A1c then let me know as in that event would probably go ahead and prescribe Jardiance.  You had some atypical chest pain recently and had work-up in the emergency department.  Since you do have history of smoking and I diabetes I went ahead and place referral to cardiologist.  You had recent finding of COPD found on chest x-ray.  Some hypoxia when you were evaluated in the emergency department.  Possible aspiration pneumonia on review of chart.  We will get repeat chest x-ray.  I did go ahead and refer you to pulmonologist.  Over the past year or so you describe 2 syncopal events.  The one in October appear to be seizure-like and that you had urinary incontinence with headache after the syncopal event.  Recent syncopal event was less suspicious for seizure.  Went ahead and place referral to neurology.  I do recommend he stop smoking.  Future CMP and A1c placed for last week of June.  Follow-up in July or as needed.

## 2020-02-21 NOTE — Addendum Note (Signed)
Addended by: Anabel Halon on: 02/21/2020 09:16 AM   Modules accepted: Orders

## 2020-02-23 ENCOUNTER — Encounter: Payer: Self-pay | Admitting: Neurology

## 2020-02-23 DIAGNOSIS — Z7189 Other specified counseling: Secondary | ICD-10-CM | POA: Insufficient documentation

## 2020-02-23 NOTE — Progress Notes (Signed)
Cardiology Office Note   Date:  02/24/2020   ID:  Andrew [name], Andrew [name] June 09, 1966, MRN [id number]  PCP:  [name], PA-C  Cardiologist:   No primary care provider on file. Referring:  [name], PA-C  No chief complaint on file.     History of Present Illness: Andrew [name] is a 54 y.o. male who presents for evaluation of chest pain.  The patient was recently in the hospital at [place].  I was able to review some of these records.  He was found unresponsive apparently or with decreased responsiveness and was hypoxic with saturations of 88%.  He required oxygen.  I do see an echocardiogram which demonstrated an EF of 55 to 60%.  There is mention in Care Everywhere of an accidental drug overdose but the patient does not know anything about this.  He is here for evaluation of chest discomfort.  He did have an echocardiogram years ago that I saw was normal.  He thinks he might of had another cardiac work-up but I cannot find this.  He has been getting chest discomfort for couple of months.  This happens with activities.  He works in a [employer].  He says that he might get discomfort when he is doing that kind of activity.  It is a sharp discomfort.  It is mid chest.  He stops what he is doing and recovers in about 5 minutes.  He is getting short of breath climbing up a flight of stairs.  He says he does not have radiation of the discomfort to his jaw or to his arms.  He is not having any resting shortness of breath, PND or orthopnea.  He has had no palpitations, presyncope or syncope.  He unfortunately smokes cigarettes.   Past Medical History:  Diagnosis Date  . Anxiety   . Arthritis    right knee  . Back pain    occasionally  . Bipolar disorder ([place])    denies  . COPD (chronic obstructive pulmonary disease) ([place])   . Depression   . Diabetes mellitus without complication ([place])   . Generalized headaches    history of migraines-last one 02/02/14  . GERD (gastroesophageal  reflux disease)    takes Prilosec daily as needed  . Headache   . Hemorrhoids   . History of bronchitis    1 month ago and treated with Amoxicillin and given an inhaler;takes Prenisone  . Hyperlipidemia    takes Zetia daily  . Hypertension    takes HCTZ daily  . Joint pain   . Pneumonia    hx of 2 yrs ago  . Polysubstance abuse ([place])   . Schizoaffective disorder ([place])    denies  . Tuberculosis    treated for 1 year  . Umbilical hernia     Past Surgical History:  Procedure Laterality Date  . ESOPHAGOGASTRODUODENOSCOPY    . JOINT REPLACEMENT    . KNEE ARTHROSCOPY WITH MEDIAL MENISECTOMY Right 03/01/2014   Procedure: KNEE ARTHROSCOPY WITH MEDIAL MENISECTOMY;  Surgeon: Meredith Pel, MD;  Location: [place];  Service: Orthopedics;  Laterality: Right;  . LIPOMA EXCISION  ~ 2010   "back left side of my head"  . PARTIAL KNEE ARTHROPLASTY Right 01/24/2015   Procedure: RIGHT KNEE MEDIAL COMPARTMENT REPLACEMENT VS TOTAL KNEE ARTHROPLASTY.;  Surgeon: Meredith Pel, MD;  Location: [place];  Service: Orthopedics;  Laterality: Right;  RIGHT KNEE MEDIAL COMPARTMENT REPLACEMENT VS TOTAL KNEE ARTHROPLASTY.  . TOTAL KNEE  ARTHROPLASTY Right      Current Outpatient Medications  Medication Sig Dispense Refill  . Accu-Chek Softclix Lancets lancets Check sugar twice a day.  Dx Code: E11.9 100 each 1  . atorvastatin (LIPITOR) 10 MG tablet Take 1 tablet (10 mg total) by mouth daily. 30 tablet 3  . atorvastatin (LIPITOR) 10 MG tablet Take 1 tablet (10 mg total) by mouth daily. 90 tablet 3  . fluticasone (FLONASE) 50 MCG/ACT nasal spray Place 1 spray into both nostrils daily. 16 g 0  . gabapentin (NEURONTIN) 100 MG capsule Take 3 capsules (300 mg total) by mouth at bedtime. 30 capsule 0  . glipiZIDE (GLUCOTROL) 5 MG tablet Take 1 tablet (5 mg total) by mouth daily before breakfast. 30 tablet 3  . glucose blood (ACCU-CHEK GUIDE) test strip Check sugar twice a day.  Dx Code: E11.9 100 each 1  .  glucose monitoring kit (FREESTYLE) monitoring kit 1 each by Does not apply route as needed for other. 1 each 0  . methocarbamol (ROBAXIN) 500 MG tablet Take 1 tablet (500 mg total) by mouth every 8 (eight) hours as needed. 60 tablet 1  . OLANZapine (ZYPREXA) 5 MG tablet Take 5 mg by mouth at bedtime.    . ondansetron (ZOFRAN ODT) 4 MG disintegrating tablet Take 1 tablet (4 mg total) by mouth every 8 (eight) hours as needed for nausea or vomiting. 5 tablet 0  . OneTouch Delica Lancets 22W MISC USE TO CHECK BLOOD SUGAR 3 TIMES DAILY. DX E11.9 100 each 3  . sertraline (ZOLOFT) 50 MG tablet Take 50 mg by mouth daily.    Marland Kitchen SPIRIVA HANDIHALER 18 MCG inhalation capsule     . tadalafil (CIALIS) 10 MG tablet Take 1 tablet (10 mg total) by mouth daily as needed for erectile dysfunction. 10 tablet 0  . tiZANidine (ZANAFLEX) 4 MG tablet 1 tab po every 8 hours as needed muscle spasm/neck pain 30 tablet 0  . traZODone (DESYREL) 50 MG tablet Take 50 mg by mouth at bedtime.    . Vitamin D, Ergocalciferol, (DRISDOL) 50000 units CAPS capsule Take 1 capsule (50,000 Units total) by mouth every 7 (seven) days. 8 capsule 0  . metoprolol tartrate (LOPRESSOR) 100 MG tablet Take 1 tablet (100 mg total) by mouth once for 1 dose. TAKE 2 HOURS PRIOR TO CTA 1 tablet 0  . nicotine (NICODERM CQ - DOSED IN MG/24 HOURS) 21 mg/24hr patch Place 1 patch (21 mg total) onto the skin daily. 28 patch 0   No current facility-administered medications for this visit.    Allergies:   Mushroom extract complex, Penicillins, Other, and Penicillins    Social History:  The patient  reports that he has been smoking. He has a 7.50 pack-year smoking history. He has never used smokeless tobacco. He reports current drug use. Drugs: Marijuana and Cocaine. He reports that he does not drink alcohol.   Family History:  The patient's family history includes Cancer in his mother; Diabetes in his brother and sister; Heart attack (age of onset: 42) in his  father; Heart disease in his father; Hyperlipidemia in his father; Hypertension in his father and mother.    ROS:  Please see the history of present illness.   Otherwise, review of systems are positive for migraines.   All other systems are reviewed and negative.    PHYSICAL EXAM: VS:  BP 124/78   Pulse 72   Temp 98.1 F (36.7 C)   Resp 20   Ht  5' 7.5" (1.715 m)   Wt 232 lb 3.2 oz (105.3 kg)   SpO2 97%   BMI 35.83 kg/m  , BMI Body mass index is 35.83 kg/m. GENERAL:  Well appearing HEENT:  Pupils equal round and reactive, fundi not visualized, oral mucosa unremarkable NECK:  No jugular venous distention, waveform within normal limits, carotid upstroke brisk and symmetric, no bruits, no thyromegaly LYMPHATICS:  No cervical, inguinal adenopathy LUNGS:  Clear to auscultation bilaterally BACK:  No CVA tenderness CHEST:  Unremarkable HEART:  PMI not displaced or sustained,S1 and S2 within normal limits, no S3, no S4, no clicks, no rubs, no murmurs ABD:  Flat, positive bowel sounds normal in frequency in pitch, no bruits, no rebound, no guarding, no midline pulsatile mass, no hepatomegaly, no splenomegaly EXT:  2 plus pulses throughout, no edema, no cyanosis no clubbing SKIN:  No rashes no nodules NEURO:  Cranial nerves II through XII grossly intact, motor grossly intact throughout PSYCH:  Cognitively intact, oriented to person place and time    EKG:  EKG is ordered today. The ekg ordered today demonstrates sinus rhythm, rate 75, left axis deviation, poor anterior R wave progression, no acute ST-T wave changes.   Recent Labs: 12/24/2019: ALT 33; BUN 17; Creatinine, Ser 1.00; Potassium 4.5; Sodium 136    Lipid Panel    Component Value Date/Time   CHOL 170 01/24/2020 1524   TRIG 70.0 01/24/2020 1524   HDL 36.90 (L) 01/24/2020 1524   CHOLHDL 5 01/24/2020 1524   VLDL 14.0 01/24/2020 1524   LDLCALC 119 (H) 01/24/2020 1524      Wt Readings from Last 3 Encounters:  02/24/20  232 lb 3.2 oz (105.3 kg)  02/21/20 230 lb 3.2 oz (104.4 kg)  12/24/19 235 lb (106.6 kg)      Other studies Reviewed: Additional studies/ records that were reviewed today include: Care Everywhere. Review of the above records demonstrates:  Please see elsewhere in the note.     ASSESSMENT AND PLAN:  Chest pain:  His pain is somewhat atypical.  However, he has significant cardiovascular risk factors.  Pretest probability is moderate for obstructive coronary disease as the etiology.  I am going to order a coronary CTA.  DM:  His A1c was 9.2.  He thinks it is better now.  I will defer to his primary provider.  Tobacco : I gave him a prescription for nicotine patches as well as 1800 QUITNOW.Bernerd Pho education: He is not interested in the vaccine.     Current medicines are reviewed at length with the patient today.  The patient does not have concerns regarding medicines.  The following changes have been made:  no change  Labs/ tests ordered today include:   Orders Placed This Encounter  Procedures  . CT CORONARY MORPH W/CTA COR W/SCORE W/CA W/CM &/OR WO/CM  . CT CORONARY FRACTIONAL FLOW RESERVE DATA PREP  . CT CORONARY FRACTIONAL FLOW RESERVE FLUID ANALYSIS  . Basic metabolic panel  . EKG 12-Lead     Disposition:   FU with me in six months.     Signed, Minus Breeding, MD  02/24/2020 1:16 PM    [facility]

## 2020-02-24 ENCOUNTER — Encounter: Payer: Self-pay | Admitting: Cardiology

## 2020-02-24 ENCOUNTER — Ambulatory Visit (INDEPENDENT_AMBULATORY_CARE_PROVIDER_SITE_OTHER): Payer: Medicare Other | Admitting: Cardiology

## 2020-02-24 ENCOUNTER — Ambulatory Visit (HOSPITAL_BASED_OUTPATIENT_CLINIC_OR_DEPARTMENT_OTHER)
Admission: RE | Admit: 2020-02-24 | Discharge: 2020-02-24 | Disposition: A | Discharge status: Home / Self Care | Payer: Medicare Other | Source: Ambulatory Visit | Attending: Medical | Admitting: Medical

## 2020-02-24 ENCOUNTER — Other Ambulatory Visit: Payer: Self-pay

## 2020-02-24 VITALS — BP 124/78 | HR 72 | Temp 98.1°F | Resp 20 | Ht 67.5 in | Wt 232.2 lb

## 2020-02-24 DIAGNOSIS — Z7189 Other specified counseling: Secondary | ICD-10-CM

## 2020-02-24 DIAGNOSIS — R072 Precordial pain: Secondary | ICD-10-CM | POA: Diagnosis not present

## 2020-02-24 DIAGNOSIS — Z01812 Encounter for preprocedural laboratory examination: Secondary | ICD-10-CM

## 2020-02-24 DIAGNOSIS — J449 Chronic obstructive pulmonary disease, unspecified: Secondary | ICD-10-CM | POA: Insufficient documentation

## 2020-02-24 DIAGNOSIS — F17200 Nicotine dependence, unspecified, uncomplicated: Secondary | ICD-10-CM | POA: Insufficient documentation

## 2020-02-24 DIAGNOSIS — R0602 Shortness of breath: Secondary | ICD-10-CM | POA: Diagnosis not present

## 2020-02-24 MED ORDER — METOPROLOL TARTRATE 100 MG PO TABS
100.0000 mg | ORAL_TABLET | Freq: Once | ORAL | 0 refills | Status: DC
Start: 2020-02-24 — End: 2021-01-09

## 2020-02-24 MED ORDER — NICOTINE 21 MG/24HR TD PT24: 21.0000 mg | MEDICATED_PATCH | Freq: Every day | TRANSDERMAL | 0 refills | Status: DC

## 2020-02-24 NOTE — Patient Instructions (Addendum)
Medication Instructions: START NICODERM CQ 21MG PATCH DAILY TAKE 100MG OF METOPROLOL 2 HOURS BEFORE YOUR CT SCAN *If you need a refill on your cardiac medications before your next appointment, please call your pharmacy*  Lab Work: Your physician recommends that you return for lab work [place] CT SCAN  Testing/Procedures: CORONARY CTA  Follow-Up: At [facility], you and your health needs are our priority.  As part of our continuing mission to provide you with exceptional heart care, we have created designated Provider Care Teams.  These Care Teams include your primary Cardiologist (physician) and Advanced Practice Providers (APPs -  Physician Assistants and Nurse Practitioners) who all work together to provide you with the care you need, when you need it.   Your next appointment:   3 month(s)  The format for your next appointment:   In Person  Provider:   ANY AVAILABLE APP  Other Instructions  1-800-QUITNOW   Your cardiac CT will be scheduled at the below location:   [facility] [place],  [address] [phone]  If scheduled at [facility], please arrive at the [facility] main entrance of [facility] 30 minutes prior to test start time. Proceed to the [facility] Radiology Department (first floor) to check-in and test prep.  Please follow these instructions carefully (unless otherwise directed):  Hold all erectile dysfunction medications at least 3 days (72 hrs) prior to test.  On the Night Before the Test: . Be sure to Drink plenty of water. . Do not consume any caffeinated/decaffeinated beverages or chocolate 12 hours prior to your test. . Do not take any antihistamines 12 hours prior to your test.  On the Day of the Test: . Drink plenty of water. Do not drink any water within one hour of the test. . Do not eat any food 4 hours prior to the test. . You may take your regular medications prior to the test.   . Take metoprolol (Lopressor) two hours prior to test.      After the Test: . Drink plenty of water. . After receiving IV contrast, you may experience a mild flushed feeling. This is normal. . On occasion, you may experience a mild rash up to 24 hours after the test. This is not dangerous. If this occurs, you can take Benadryl 25 mg and increase your fluid intake. . If you experience trouble breathing, this can be serious. If it is severe call 911 IMMEDIATELY. If it is mild, please call our office. . If you take any of these medications: Glipizide/Metformin, Avandament, Glucavance, please do not take 48 hours after completing test unless otherwise instructed.   Once we have confirmed authorization from your insurance company, we will call you to set up a date and time for your test.   For non-scheduling related questions, please contact the cardiac imaging nurse navigator should you have any questions/concerns: Marchia Bond, Cardiac Imaging Nurse Navigator Burley Saver, Interim Cardiac Imaging Nurse [place] [facility] Direct Office Dial: [phone]   For scheduling needs, including cancellations and rescheduling, please call [phone].

## 2020-02-25 ENCOUNTER — Telehealth: Payer: Self-pay | Admitting: Medical

## 2020-02-25 NOTE — Telephone Encounter (Signed)
Patient contacted and advised of results.  

## 2020-02-25 NOTE — Telephone Encounter (Signed)
Patient requesting a call back from nurse inregards to lab results.

## 2020-02-29 ENCOUNTER — Ambulatory Visit: Payer: Medicare Other | Admitting: Podiatry

## 2020-02-29 ENCOUNTER — Other Ambulatory Visit: Payer: Self-pay

## 2020-02-29 ENCOUNTER — Ambulatory Visit: Payer: Medicare Other | Admitting: Pharmacist

## 2020-02-29 DIAGNOSIS — E785 Hyperlipidemia, unspecified: Secondary | ICD-10-CM

## 2020-02-29 DIAGNOSIS — F17200 Nicotine dependence, unspecified, uncomplicated: Secondary | ICD-10-CM

## 2020-02-29 DIAGNOSIS — E119 Type 2 diabetes mellitus without complications: Secondary | ICD-10-CM

## 2020-02-29 NOTE — Patient Instructions (Signed)
Visit Information  Goals Addressed            This Visit's Progress   . Chronic Care Mangement Pharmacy Care Plan   On track    CARE PLAN ENTRY  Current Barriers:  . Chronic Disease Management support, education, and care coordination needs related to Diabetes, Hyperlipidemia, Vitamin D Deficiency, Depression  Hyperlipidemia . Pharmacist Clinical Goal(s): o Over the next 90 days, patient will work with PharmD and providers to achieve LDL goal <100 . Current regimen:  o Atorvastatin 10mg  daily Interventions: o Encouraged patient to fill medication . Patient self care activities - Over the next 90 days, patient will: o Maintain cholesterol medication regimen.   Diabetes . Pharmacist Clinical Goal(s): o Over the next 90 days, patient will work with PharmD and providers to achieve A1c goal <7% . Current regimen:  o Diet and exercise management  (will consider adding Jardiance if needed pending next a1c) . Interventions: o Discussed how to treat hypoglycemia o Collaboration with health plan regarding medication benefit (copay to patient for SGLT2, DPP4, and GLP1) o Collaboration with provider regarding medication management (glipizide replacement with Jardiance)  . Patient self care activities - Over the next 90 days, patient will: o Check blood sugar once daily, document, and provide at future appointments o Contact provider with any episodes of hypoglycemia  Vitamin D Deficiency . Pharmacist Clinical Goal(s) o Over the next 90 days, patient will work with PharmD and providers to maintain vitamin D level within normal limits  . Current regimen:  o None  . Interventions: o Consider completing vitamin D lab at next visit . Patient self care activities - Over the next 90 days, patient will: o Complete vitamin D lab if agreeable with Percell Miller  Tobacco Use Disorder . Pharmacist Clinical Goal(s) o Over the next 90 days, patient will work with PharmD and providers to stop  smoking . Current regimen:  o Only smoking after meals (three times daily) . Interventions: o Encouraged patient to call QUITNOW number to get assistance with nicotine patches . Patient self care activities - Over the next 90 days, patient will: o Call 1-800-QUITNOW to get assistance with nicotine patches o Only smoke 2 cigarettes per Keionte Swicegood (only after 2 meals)   Medication management . Pharmacist Clinical Goal(s): o Over the next 90 days, patient will work with PharmD and providers to achieve optimal medication adherence . Current pharmacy: [facility] . Interventions o Comprehensive medication review performed. o Continue current medication management strategy . Patient self care activities - Over the next 90 days, patient will: o Focus on medication adherence by filling medications appropriately  o Take medications as prescribed o Report any questions or concerns to PharmD and/or provider(s)  Please see past updates related to this goal by clicking on the "Past Updates" button in the selected goal         The patient verbalized understanding of instructions provided today and agreed to receive a mailed copy of patient instruction and/or educational materials.  Telephone follow up appointment with pharmacy team member scheduled for: 04/07/2020  Melvenia Beam Joury Allcorn, PharmD Clinical Pharmacist [facility] [phone]  Coping with Quitting Smoking  Quitting smoking is a physical and mental challenge. You will face cravings, withdrawal symptoms, and temptation. Before quitting, work with your health care provider to make a plan that can help you cope. Preparation can help you quit and keep you from giving in. How can I cope with cravings? Cravings usually last  for 5-10 minutes. If you get through it, the craving will pass. Consider taking the following actions to help you cope with cravings:  Keep your mouth busy: ? Chew sugar-free gum. ? Suck on hard candies or  a straw. ? Brush your teeth.  Keep your hands and body busy: ? Immediately change to a different activity when you feel a craving. ? Squeeze or play with a ball. ? Do an activity or a hobby, like making bead jewelry, practicing needlepoint, or working with wood. ? Mix up your normal routine. ? Take a short exercise break. Go for a quick walk or run up and down stairs. ? Spend time in public places where smoking is not allowed.  Focus on doing something kind or helpful for someone else.  Call a friend or family member to talk during a craving.  Join a support group.  Call a quit line, such as 1-800-QUIT-NOW.  Talk with your health care provider about medicines that might help you cope with cravings and make quitting easier for you. How can I deal with withdrawal symptoms? Your body may experience negative effects as it tries to get used to not having nicotine in the system. These effects are called withdrawal symptoms. They may include:  Feeling hungrier than normal.  Trouble concentrating.  Irritability.  Trouble sleeping.  Feeling depressed.  Restlessness and agitation.  Craving a cigarette. To manage withdrawal symptoms:  Avoid places, people, and activities that trigger your cravings.  Remember why you want to quit.  Get plenty of sleep.  Avoid coffee and other caffeinated drinks. These may worsen some of your symptoms. How can I handle social situations? Social situations can be difficult when you are quitting smoking, especially in the first few weeks. To manage this, you can:  Avoid parties, bars, and other social situations where people might be smoking.  Avoid alcohol.  Leave right away if you have the urge to smoke.  Explain to your family and friends that you are quitting smoking. Ask for understanding and support.  Plan activities with friends or family where smoking is not an option. What are some ways I can cope with stress? Wanting to smoke may  cause stress, and stress can make you want to smoke. Find ways to manage your stress. Relaxation techniques can help. For example:  Breathe slowly and deeply, in through your nose and out through your mouth.  Listen to soothing, relaxing music.  Talk with a family member or friend about your stress.  Light a candle.  Soak in a bath or take a shower.  Think about a peaceful place. What are some ways I can prevent weight gain? Be aware that many people gain weight after they quit smoking. However, not everyone does. To keep from gaining weight, have a plan in place before you quit and stick to the plan after you quit. Your plan should include:  Having healthy snacks. When you have a craving, it may help to: ? Eat plain popcorn, crunchy carrots, celery, or other cut vegetables. ? Chew sugar-free gum.  Changing how you eat: ? Eat small portion sizes at meals. ? Eat 4-6 small meals throughout the Valyncia Wiens instead of 1-2 large meals a Kentrel Clevenger. ? Be mindful when you eat. Do not watch television or do other things that might distract you as you eat.  Exercising regularly: ? Make time to exercise each Latoyia Tecson. If you do not have time for a long workout, do short bouts of exercise for  5-10 minutes several times a Shiann Kam. ? Do some form of strengthening exercise, like weight lifting, and some form of aerobic exercise, like running or swimming.  Drinking plenty of water or other low-calorie or no-calorie drinks. Drink 6-8 glasses of water daily, or as much as instructed by your health care provider. Summary  Quitting smoking is a physical and mental challenge. You will face cravings, withdrawal symptoms, and temptation to smoke again. Preparation can help you as you go through these challenges.  You can cope with cravings by keeping your mouth busy (such as by chewing gum), keeping your body and hands busy, and making calls to family, friends, or a helpline for people who want to quit smoking.  You can cope  with withdrawal symptoms by avoiding places where people smoke, avoiding drinks with caffeine, and getting plenty of rest.  Ask your health care provider about the different ways to prevent weight gain, avoid stress, and handle social situations. This information is not intended to replace advice given to you by your health care provider. Make sure you discuss any questions you have with your health care provider. Document Revised: 09/05/2017 Document Reviewed: 09/20/2016 Elsevier Patient Education  2020 [employer].

## 2020-02-29 NOTE — Chronic Care Management (AMB) (Signed)
Chronic Care Management Pharmacy  Name: GLENN GULLICKSON  MRN: [id number] DOB: [birth year]  Chief Complaint/ HPI  Andrew [name],  54 y.o. , male presents for their Follow-Up CCM visit with the clinical pharmacist via telephone due to COVID-19 Pandemic.  PCP : [name], PA-C  Their chronic conditions include:  Diabetes, Hyperlipidemia, Vitamin D Deficiency, Depression  Office Visits: 02/21/20: Visit w/ [name], PA-C - Recheck a1c at end of June. Consider starting Jardiance if needed. Review of BG today is 80-120. D/C glipizide presently. Prescribed glucerna. Referral to cardio for chest pain. COPD found on chest Xray. Referral to pulmonologist. Syncopal episodes. Referral to neurology. A1c and cmp end of June. Follow up in July or as needed.   02/16/20: CCM Med Coverage Inquiry: Patient can get SGLT2, DDP4, and GLP1 30 DS meds for $4 due to Level 2 Low Income Subsidy. Recommended that patient D/C glipizide and start Jardiance if a1c <9% due to increased risk of urinary side effects when used in patients with a1c >9%.   Consult Visit: 02/24/20: Cardio visit w/ Dr. Percival Spanish - Precordial chest pain. Pain atypical. Significant cardio risk factors. Ordered coronary CTA.   ED Visit:  02/07/20  Medications: Outpatient Encounter Medications as of 02/29/2020  Medication Sig Note  . Accu-Chek Softclix Lancets lancets Check sugar twice a Halei Hanover.  Dx Code: E11.9   . atorvastatin (LIPITOR) 10 MG tablet Take 1 tablet (10 mg total) by mouth daily.   Marland Kitchen atorvastatin (LIPITOR) 10 MG tablet Take 1 tablet (10 mg total) by mouth daily.   . fluticasone (FLONASE) 50 MCG/ACT nasal spray Place 1 spray into both nostrils daily.   Marland Kitchen gabapentin (NEURONTIN) 100 MG capsule Take 3 capsules (300 mg total) by mouth at bedtime.   Marland Kitchen glipiZIDE (GLUCOTROL) 5 MG tablet Take 1 tablet (5 mg total) by mouth daily before breakfast.   . glucose blood (ACCU-CHEK GUIDE) test strip Check sugar twice a Chan Rosasco.  Dx Code: E11.9     . glucose monitoring kit (FREESTYLE) monitoring kit 1 each by Does not apply route as needed for other.   . methocarbamol (ROBAXIN) 500 MG tablet Take 1 tablet (500 mg total) by mouth every 8 (eight) hours as needed.   . metoprolol tartrate (LOPRESSOR) 100 MG tablet Take 1 tablet (100 mg total) by mouth once for 1 dose. TAKE 2 HOURS PRIOR TO CTA   . nicotine (NICODERM CQ - DOSED IN MG/24 HOURS) 21 mg/24hr patch Place 1 patch (21 mg total) onto the skin daily.   Marland Kitchen OLANZapine (ZYPREXA) 5 MG tablet Take 5 mg by mouth at bedtime. 01/28/2020: Patient doesn't remember taking these meds  . ondansetron (ZOFRAN ODT) 4 MG disintegrating tablet Take 1 tablet (4 mg total) by mouth every 8 (eight) hours as needed for nausea or vomiting.   Glory Rosebush Delica Lancets 88Q MISC USE TO CHECK BLOOD SUGAR 3 TIMES DAILY. DX E11.9   . sertraline (ZOLOFT) 50 MG tablet Take 50 mg by mouth daily. 01/28/2020: Patient doesn't remember taking these meds  . SPIRIVA HANDIHALER 18 MCG inhalation capsule    . tadalafil (CIALIS) 10 MG tablet Take 1 tablet (10 mg total) by mouth daily as needed for erectile dysfunction.   Marland Kitchen tiZANidine (ZANAFLEX) 4 MG tablet 1 tab po every 8 hours as needed muscle spasm/neck pain   . traZODone (DESYREL) 50 MG tablet Take 50 mg by mouth at bedtime. 01/28/2020: Patient reports he never took this medication  . Vitamin D, Ergocalciferol, (  DRISDOL) 50000 units CAPS capsule Take 1 capsule (50,000 Units total) by mouth every 7 (seven) days.    No facility-administered encounter medications on file as of 02/29/2020.   SDOH Screenings   Alcohol Screen:   . Last Alcohol Screening Score (AUDIT):   Depression (PHQ2-9): Medium Risk  . PHQ-2 Score: 17  Financial Resource Strain:   . Difficulty of Paying Living Expenses:   Food Insecurity:   . Worried About Charity fundraiser in the Last Year:   . [place] in the Last Year:   Housing:   . Last Housing Risk Score:   Physical Activity:   . Days of  Exercise per Week:   . Minutes of Exercise per Session:   Social Connections:   . Frequency of Communication with Friends and Family:   . Frequency of Social Gatherings with Friends and Family:   . Attends Religious Services:   . Active Member of Clubs or Organizations:   . Attends Archivist Meetings:   Marland Kitchen Marital Status:   Stress:   . Feeling of Stress :   Tobacco Use: High Risk  . Smoking Tobacco Use: Current Every Steffany Schoenfelder Smoker  . Smokeless Tobacco Use: Never Used  Transportation Needs:   . Film/video editor (Medical):   Marland Kitchen Lack of Transportation (Non-Medical):      Current Diagnosis/Assessment:  Goals Addressed            This Visit's Progress   . Chronic Care Mangement Pharmacy Care Plan   On track    CARE PLAN ENTRY  Current Barriers:  . Chronic Disease Management support, education, and care coordination needs related to Diabetes, Hyperlipidemia, Vitamin D Deficiency, Depression  Hyperlipidemia . Pharmacist Clinical Goal(s): o Over the next 90 days, patient will work with PharmD and providers to achieve LDL goal <100 . Current regimen:  o Atorvastatin 73m daily Interventions: o Encouraged patient to fill medication . Patient self care activities - Over the next 90 days, patient will: o Maintain cholesterol medication regimen.   Diabetes . Pharmacist Clinical Goal(s): o Over the next 90 days, patient will work with PharmD and providers to achieve A1c goal <7% . Current regimen:  o Diet and exercise management  (will consider adding Jardiance if needed pending next a1c) . Interventions: o Discussed how to treat hypoglycemia o Collaboration with health plan regarding medication benefit (copay to patient for SGLT2, DPP4, and GLP1) o Collaboration with provider regarding medication management (glipizide replacement with Jardiance)  . Patient self care activities - Over the next 90 days, patient will: o Check blood sugar once daily, document, and  provide at future appointments o Contact provider with any episodes of hypoglycemia  Vitamin D Deficiency . Pharmacist Clinical Goal(s) o Over the next 90 days, patient will work with PharmD and providers to maintain vitamin D level within normal limits  . Current regimen:  o None  . Interventions: o Consider completing vitamin D lab at next visit . Patient self care activities - Over the next 90 days, patient will: o Complete vitamin D lab if agreeable with EPercell Miller Tobacco Use Disorder . Pharmacist Clinical Goal(s) o Over the next 90 days, patient will work with PharmD and providers to stop smoking . Current regimen:  o Only smoking after meals (three times daily) . Interventions: o Encouraged patient to call QUITNOW number to get assistance with nicotine patches . Patient self care activities - Over the next 90 days, patient will: o  Call 1-800-QUITNOW to get assistance with nicotine patches o Only smoke 2 cigarettes per Tadashi Burkel (only after 2 meals)   Medication management . Pharmacist Clinical Goal(s): o Over the next 90 days, patient will work with PharmD and providers to achieve optimal medication adherence . [facility] . Interventions o Comprehensive medication review performed. o Continue current medication management strategy . Patient self care activities - Over the next 90 days, patient will: o Focus on medication adherence by filling medications appropriately  o Take medications as prescribed o Report any questions or concerns to PharmD and/or provider(s)  Please see past updates related to this goal by clicking on the "Past Updates" button in the selected goal        Social Hx: Had to put his dog to sleep today.  Was institutionalized for 8 months Cuts trees for living.   Diabetes   Recent Relevant Labs: Lab Results  Component Value Date/Time   HGBA1C 9.4 (H) 12/24/2019 11:55 AM   HGBA1C 6.3 09/18/2018 08:24 AM   MICROALBUR 0.9 05/18/2018 09:07  AM    <7% Avg: 155  Checking BG: 2x per Carlynn Leduc (sometimes 3 times per Nashay Brickley)  Recent FBG Readings: "98-115"  Patient has failed these meds in past: metformin (GI and pt feels like it will cause cancer) Patient is currently uncontrolled per a1c, but at goal per home BG on the following medications: None (glipizide recently D/C, will consider an SGLT2 pending next a1c)   Last diabetic Eye exam: No results found for: HMDIABEYEEXA  Last diabetic Foot exam: No results found for: HMDIABFOOTEX   Update 02/29/20 Lowest: 65 Highest: 115 Got a BG of 65 last night that made him nauseous. Treated with candy. Wonders about alternatives to candy for treatment of hypoglycemia Does not have meter with him at the moment.  Has another appt today.   We discussed: how to recognize and treat signs of hypoglycemia  Recommended hypoglycemia alternatives (raisens, glucose tabs, glucose gel)   Plan -Continue control with diet and exercise     Tobacco Use Disorder   Tobacco Status:  Social History   Tobacco Use  Smoking Status Current Every Gearold Wainer Smoker  . Packs/Aaylah Pokorny: 0.25  . Years: 30.00  . Pack years: 7.50  Smokeless Tobacco Never Used  Tobacco Comment   3 cigarettes per Yuuki Skeens (after meals)   Tobacco Use Disorder Patient has failed these meds in past: None noted  Patient is currently uncontrolled, but improving on the following medications: None  Patient states he is only smoking after meals.  Plans to smoke only after 2 meals next week.  Plans to call 1-800-QuitNow today to get free nicotine patches.  Cost of patches are a barrier.   We discussed:  Encourgement of the progress he has made so far with smoking  Plan -Call Quit now number to get assistance with nicotine patches  Hyperlipidemia   Lipid Panel     Component Value Date/Time   CHOL 170 01/24/2020 1524   TRIG 70.0 01/24/2020 1524   HDL 36.90 (L) 01/24/2020 1524   CHOLHDL 5 01/24/2020 1524   VLDL 14.0 01/24/2020 1524    LDLCALC 119 (H) 01/24/2020 1524    LDL goal <100  The 10-year ASCVD risk score Mikey Bussing DC Jr., et al., 2013) is: 19.3%   Values used to calculate the score:     Age: 57 years     Sex: Male     Is Non-Hispanic African American: Yes     Diabetic: Yes  Tobacco smoker: Yes     Systolic Blood Pressure: 002 mmHg     Is BP treated: No     HDL Cholesterol: 36.9 mg/dL     Total Cholesterol: 170 mg/dL   Patient has failed these meds in past: None noted  Patient is currently uncontrolled (has been off of meds for a period of time previous to lab drawn) on the following medications: atorvastatin 33m daily  Per Dispense Report Atorvastatin 01/28/20: 90 DS  Plan -Continue current medications  Vitamin D Deficiency   03/24/18: Vitamin D 03/24/2018  Patient has failed these meds in past: None noted  Patient is currently uncontrolled per most recent lab on the following medications: None  Feels very fatigued.  Does not feel very motivated  We discussed:   Need to assess vitamin D level  Plan -Consider completing vitamin D lab at next lab visit   Depression   Did not fully assess at this visit. Did request for vitamin D to be checked as patient seems unwilling to initiate treatment from depression, has had a low vitamin D level in the past, expressed symptoms of fatigue, and could benefit from vitamin D therapy if his vitamin D is low.   Patient has failed these meds in past: None noted  Patient is currently uncontrolled on the following medications: None  PHQ9: 17 When discussing medication and behavorial health medications "Some things you can't fix"  Patient seems unwilling to consider treatment at current time  We discussed:   Importance of maintaining mental health  Plan -Continue discussion with patient about treatment for depression   Meds to D/C from list Vitamin D (states he isn't taking,may need this for future refill, will leave on list) Atorvastatin 198OR (duplicate) Gabapentin 1308LU(states he isn't taking) Trazodone (states he isn't taking) Tizanidine (states he isn't taking) Cialis (states he isn't taking) Ondansetron (states he isn't taking) Olanzapine (states he isn't taking) Methocarbamol (states he isn't taking)

## 2020-03-01 ENCOUNTER — Ambulatory Visit: Payer: Medicare Other | Admitting: Podiatry

## 2020-03-02 ENCOUNTER — Other Ambulatory Visit: Payer: Self-pay | Admitting: Medical

## 2020-03-02 NOTE — Telephone Encounter (Signed)
Medication: SPIRIVA HANDIHALER 18 MCG inhalation capsule       Has the patient contacted their pharmacy?  (If no, request that the patient contact the pharmacy for the refill.) (If yes, when and what did the pharmacy advise?)     Preferred Pharmacy (with phone number or street name): [id number] [address], [place] - [address]  [place], [place] [address]  Phone:  [phone] Fax:  [phone]      Agent: Please be advised that RX refills may take up to 3 business days. We ask that you follow-up with your pharmacy.

## 2020-03-03 ENCOUNTER — Other Ambulatory Visit: Payer: Self-pay | Admitting: *Deleted

## 2020-03-03 MED ORDER — ALBUTEROL SULFATE HFA 108 (90 BASE) MCG/ACT IN AERS: 2.0000 | INHALATION_SPRAY | RESPIRATORY_TRACT | 0 refills | Status: DC | PRN

## 2020-03-03 MED ORDER — SPIRIVA HANDIHALER 18 MCG IN CAPS: 18.0000 ug | ORAL_CAPSULE | Freq: Every day | RESPIRATORY_TRACT | 0 refills | Status: DC

## 2020-03-03 NOTE — Telephone Encounter (Signed)
Refill sent to pharmacy.   

## 2020-03-07 ENCOUNTER — Other Ambulatory Visit: Payer: Self-pay

## 2020-03-07 ENCOUNTER — Ambulatory Visit: Payer: Medicare Other | Admitting: Neurology

## 2020-03-07 DIAGNOSIS — E785 Hyperlipidemia, unspecified: Secondary | ICD-10-CM

## 2020-03-07 DIAGNOSIS — J449 Chronic obstructive pulmonary disease, unspecified: Secondary | ICD-10-CM

## 2020-03-07 DIAGNOSIS — E119 Type 2 diabetes mellitus without complications: Secondary | ICD-10-CM

## 2020-03-16 DIAGNOSIS — R072 Precordial pain: Secondary | ICD-10-CM | POA: Diagnosis not present

## 2020-03-16 DIAGNOSIS — R1110 Vomiting, unspecified: Secondary | ICD-10-CM | POA: Diagnosis not present

## 2020-03-16 DIAGNOSIS — R0602 Shortness of breath: Secondary | ICD-10-CM | POA: Diagnosis not present

## 2020-03-16 DIAGNOSIS — L309 Dermatitis, unspecified: Secondary | ICD-10-CM | POA: Diagnosis not present

## 2020-03-16 DIAGNOSIS — R079 Chest pain, unspecified: Secondary | ICD-10-CM | POA: Diagnosis not present

## 2020-03-16 DIAGNOSIS — R0789 Other chest pain: Secondary | ICD-10-CM | POA: Diagnosis not present

## 2020-03-16 DIAGNOSIS — R21 Rash and other nonspecific skin eruption: Secondary | ICD-10-CM | POA: Diagnosis not present

## 2020-03-16 DIAGNOSIS — F17210 Nicotine dependence, cigarettes, uncomplicated: Secondary | ICD-10-CM | POA: Diagnosis not present

## 2020-03-20 ENCOUNTER — Telehealth: Payer: Self-pay | Admitting: Cardiology

## 2020-03-20 NOTE — Telephone Encounter (Signed)
°  Patient was returning call. I looked through chart to see where to direct call. Transferred call to CT department

## 2020-03-21 ENCOUNTER — Telehealth (HOSPITAL_COMMUNITY): Payer: Self-pay | Admitting: *Deleted

## 2020-03-21 DIAGNOSIS — Z01812 Encounter for preprocedural laboratory examination: Secondary | ICD-10-CM | POA: Diagnosis not present

## 2020-03-21 NOTE — Telephone Encounter (Signed)

## 2020-03-22 LAB — BASIC METABOLIC PANEL
BUN/Creatinine Ratio: 14 (ref 9–20)
BUN: 14 mg/dL (ref 6–24)
CO2: 24 mmol/L (ref 20–29)
Calcium: 9.6 mg/dL (ref 8.7–10.2)
Chloride: 106 mmol/L (ref 96–106)
Creatinine, Ser: 1.01 mg/dL (ref 0.76–1.27)
GFR calc Af Amer: 97 mL/min/{1.73_m2} (ref >59)
GFR calc non Af Amer: 84 mL/min/{1.73_m2} (ref >59)
Glucose: 106 mg/dL — ABNORMAL HIGH (ref 65–99)
Potassium: 4.6 mmol/L (ref 3.5–5.2)
Sodium: 143 mmol/L (ref 134–144)

## 2020-03-23 ENCOUNTER — Encounter: Payer: Medicare Other | Admitting: *Deleted

## 2020-03-23 ENCOUNTER — Other Ambulatory Visit: Payer: Self-pay

## 2020-03-23 ENCOUNTER — Ambulatory Visit (HOSPITAL_COMMUNITY)
Admission: RE | Admit: 2020-03-23 | Discharge: 2020-03-23 | Disposition: A | Discharge status: Home / Self Care | Payer: Medicare Other | Source: Ambulatory Visit | Attending: Cardiology | Admitting: Cardiology

## 2020-03-23 DIAGNOSIS — R072 Precordial pain: Secondary | ICD-10-CM | POA: Insufficient documentation

## 2020-03-23 DIAGNOSIS — Z006 Encounter for examination for normal comparison and control in clinical research program: Secondary | ICD-10-CM

## 2020-03-23 MED ORDER — IOHEXOL 350 MG/ML SOLN
80.0000 mL | Freq: Once | INTRAVENOUS | Status: AC | PRN
  Administered 2020-03-23: 80 mL via INTRAVENOUS

## 2020-03-23 MED ORDER — NITROGLYCERIN 0.4 MG SL SUBL
SUBLINGUAL_TABLET | SUBLINGUAL | Status: AC
  Filled 2020-03-23: qty 2

## 2020-03-23 MED ORDER — NITROGLYCERIN 0.4 MG SL SUBL
0.8000 mg | SUBLINGUAL_TABLET | Freq: Once | SUBLINGUAL | Status: AC
  Administered 2020-03-23: 0.8 mg via SUBLINGUAL

## 2020-03-23 NOTE — Research (Signed)
CADFEM Informed Consent                  Subject Name:   Andrew [name] Anon   Subject met inclusion and exclusion criteria.  The informed consent form, study requirements and expectations were reviewed with the subject and questions and concerns were addressed prior to the signing of the consent form.  The subject verbalized understanding of the trial requirements.  The subject agreed to participate in the CADFEM trial and signed the informed consent.  The informed consent was obtained prior to performance of any protocol-specific procedures for the subject.  A copy of the signed informed consent was given to the subject and a copy was placed in the subject's medical record.   [name], Naval architect.  03/23/2020 09:49 a.m.

## 2020-03-27 ENCOUNTER — Telehealth: Payer: Self-pay

## 2020-03-27 DIAGNOSIS — R911 Solitary pulmonary nodule: Secondary | ICD-10-CM

## 2020-03-27 NOTE — Telephone Encounter (Signed)
-----   Message from Minus Breeding, MD sent at 03/26/2020  3:40 PM EDT ----- No evidence of CAD.  Coronary calcium score is zero.  No further cardiac work up.  There is a small nodule in the lungs to be followed with noncontrasted CT in 1 year.  Please schedule.  Call patient with results.  Send results to [name], PA-C

## 2020-03-27 NOTE — Telephone Encounter (Signed)
Spoke with patient. Reviewed results and discussed need for chest CT in one year to assess lung nodule. Understanding verbalized, order placed.

## 2020-03-31 NOTE — Progress Notes (Deleted)
Subjective:   Andrew [name] is a 54 y.o. male who presents for an Initial Medicare Annual Wellness Visit.  Review of Systems    ***       Objective:    There were no vitals filed for this visit. There is no height or weight on file to calculate BMI.  Advanced Directives 11/23/2018 10/28/2018 03/11/2018 03/07/2018 03/06/2018 09/28/2017 11/09/2016  Does Patient Have a Medical Advance Directive? _0  No No  Would patient like information on creating a medical advance directive? No - Patient declined - No - Patient declined No - Patient declined - No - Patient declined -  Pre-existing out of facility DNR order (yellow form or pink MOST form) - - - - - - -    Current Medications (verified) Outpatient Encounter Medications as of 04/03/2020  Medication Sig  . Accu-Chek Softclix Lancets lancets Check sugar twice a day.  Dx Code: E11.9  . albuterol (VENTOLIN HFA) 108 (90 Base) MCG/ACT inhaler Inhale 2 puffs into the lungs every 4 (four) hours as needed for wheezing or shortness of breath.  Marland Kitchen atorvastatin (LIPITOR) 10 MG tablet Take 1 tablet (10 mg total) by mouth daily.  Marland Kitchen atorvastatin (LIPITOR) 10 MG tablet Take 1 tablet (10 mg total) by mouth daily.  . fluticasone (FLONASE) 50 MCG/ACT nasal spray Place 1 spray into both nostrils daily.  Marland Kitchen gabapentin (NEURONTIN) 100 MG capsule Take 3 capsules (300 mg total) by mouth at bedtime.  Marland Kitchen glipiZIDE (GLUCOTROL) 5 MG tablet Take 1 tablet (5 mg total) by mouth daily before breakfast.  . glucose blood (ACCU-CHEK GUIDE) test strip Check sugar twice a day.  Dx Code: E11.9  . glucose monitoring kit (FREESTYLE) monitoring kit 1 each by Does not apply route as needed for other.  . methocarbamol (ROBAXIN) 500 MG tablet Take 1 tablet (500 mg total) by mouth every 8 (eight) hours as needed.  . metoprolol tartrate (LOPRESSOR) 100 MG tablet Take 1 tablet (100 mg total) by mouth once for 1 dose. TAKE 2 HOURS PRIOR TO CTA  . nicotine (NICODERM CQ - DOSED IN  MG/24 HOURS) 21 mg/24hr patch Place 1 patch (21 mg total) onto the skin daily.  Marland Kitchen OLANZapine (ZYPREXA) 5 MG tablet Take 5 mg by mouth at bedtime.  . ondansetron (ZOFRAN ODT) 4 MG disintegrating tablet Take 1 tablet (4 mg total) by mouth every 8 (eight) hours as needed for nausea or vomiting.  Glory Rosebush Delica Lancets 31V MISC USE TO CHECK BLOOD SUGAR 3 TIMES DAILY. DX E11.9  . sertraline (ZOLOFT) 50 MG tablet Take 50 mg by mouth daily.  Marland Kitchen SPIRIVA HANDIHALER 18 MCG inhalation capsule Place 1 capsule (18 mcg total) into inhaler and inhale daily.  . tadalafil (CIALIS) 10 MG tablet Take 1 tablet (10 mg total) by mouth daily as needed for erectile dysfunction.  Marland Kitchen tiZANidine (ZANAFLEX) 4 MG tablet 1 tab po every 8 hours as needed muscle spasm/neck pain  . traZODone (DESYREL) 50 MG tablet Take 50 mg by mouth at bedtime.  . Vitamin D, Ergocalciferol, (DRISDOL) 50000 units CAPS capsule Take 1 capsule (50,000 Units total) by mouth every 7 (seven) days.   No facility-administered encounter medications on file as of 04/03/2020.    Allergies (verified) Mushroom extract complex, Penicillins, Other, and Penicillins   History: Past Medical History:  Diagnosis Date  . Anxiety   . Arthritis    right knee  . Back pain    occasionally  . Bipolar  disorder [facility])    denies  . COPD (chronic obstructive pulmonary disease) ([place])   . Depression   . Diabetes mellitus without complication ([place])   . Generalized headaches    history of migraines-last one 02/02/14  . GERD (gastroesophageal reflux disease)    takes Prilosec daily as needed  . Headache   . Hemorrhoids   . History of bronchitis    1 month ago and treated with Amoxicillin and given an inhaler;takes Prenisone  . Hyperlipidemia    takes Zetia daily  . Hypertension    takes HCTZ daily  . Joint pain   . Pneumonia    hx of 2 yrs ago  . Polysubstance abuse ([place])   . Schizoaffective disorder ([place])    denies  . Tuberculosis    treated for 1 year   . Umbilical hernia    Past Surgical History:  Procedure Laterality Date  . ESOPHAGOGASTRODUODENOSCOPY    . JOINT REPLACEMENT    . KNEE ARTHROSCOPY WITH MEDIAL MENISECTOMY Right 03/01/2014   Procedure: KNEE ARTHROSCOPY WITH MEDIAL MENISECTOMY;  Surgeon: Meredith Pel, MD;  Location: [place];  Service: Orthopedics;  Laterality: Right;  . LIPOMA EXCISION  ~ 2010   "back left side of my head"  . PARTIAL KNEE ARTHROPLASTY Right 01/24/2015   Procedure: RIGHT KNEE MEDIAL COMPARTMENT REPLACEMENT VS TOTAL KNEE ARTHROPLASTY.;  Surgeon: Meredith Pel, MD;  Location: [place];  Service: Orthopedics;  Laterality: Right;  RIGHT KNEE MEDIAL COMPARTMENT REPLACEMENT VS TOTAL KNEE ARTHROPLASTY.  . TOTAL KNEE ARTHROPLASTY Right    Family History  Problem Relation Age of Onset  . Hypertension Mother   . Cancer Mother   . Heart disease Father   . Heart attack Father 29  . Hyperlipidemia Father   . Hypertension Father   . Diabetes Sister   . Diabetes Brother    Social History   Socioeconomic History  . Marital status: Single    Spouse name: Not on file  . Number of children: Not on file  . Years of education: Not on file  . Highest education level: Not on file  Occupational History  . Occupation: lifts and pulls concrete    Employer: [employer]   Tobacco Use  . Smoking status: Current Every Day Smoker    Packs/day: 0.25    Years: 30.00    Pack years: 7.50  . Smokeless tobacco: Never Used  . Tobacco comment: 3 cigarettes per day (after meals)  Vaping Use  . Vaping Use: Never used  Substance and Sexual Activity  . Alcohol use: No    Comment: occasionally   . Drug use: Yes    Types: Marijuana, Cocaine    Comment: cocaine, night before last  . Sexual activity: Yes  Other Topics Concern  . Not on file  Social History Narrative   ** Merged History Encounter **       Lives with girlfriend. Work involves a lot of pulling and lifting concrete, was not definite about type of work. Denies  use of illegal drugs. Smokes half pack of cigarettes daily.   Social Determinants of Health   Financial Resource Strain:   . Difficulty of Paying Living Expenses:   Food Insecurity:   . Worried About Charity fundraiser in the Last Year:   . Arboriculturist in the Last Year:   Transportation Needs:   . Film/video editor (Medical):   Marland Kitchen Lack of Transportation (Non-Medical):   Physical Activity:   .  Days of Exercise per Week:   . Minutes of Exercise per Session:   Stress:   . Feeling of Stress :   Social Connections:   . Frequency of Communication with Friends and Family:   . Frequency of Social Gatherings with Friends and Family:   . Attends Religious Services:   . Active Member of Clubs or Organizations:   . Attends Archivist Meetings:   Marland Kitchen Marital Status:     Tobacco Counseling Ready to quit: Not Answered Counseling given: Not Answered Comment: 3 cigarettes per day (after meals)   Clinical Intake:                       Activities of Daily Living No flowsheet data found.  Patient Care Team: [place], Iris Pert as PCP - General (Internal Medicine) Day, Melvenia Beam, [facility] as Pharmacist (Pharmacist)  Indicate any recent Medical Services you may have received from other than [facility] providers in the past year (date may be approximate).     Assessment:   This is a routine wellness examination for Andrew [name].  Hearing/Vision screen No exam data present  Dietary issues and exercise activities discussed:   Diet (meal preparation, eat out, water intake, caffeinated beverages, dairy products, fruits and vegetables): {Desc; diets:16563} Breakfast: Lunch:  Dinner:      Goals    . Chronic Care Mangement Pharmacy Care Plan     CARE PLAN ENTRY  Current Barriers:  . Chronic Disease Management support, education, and care coordination needs related to Diabetes, Hyperlipidemia, Vitamin D Deficiency, Depression  Hyperlipidemia . Pharmacist Clinical  Goal(s): o Over the next 90 days, patient will work with PharmD and providers to achieve LDL goal <100 . Current regimen:  o Atorvastatin 44m daily Interventions: o Encouraged patient to fill medication . Patient self care activities - Over the next 90 days, patient will: o Maintain cholesterol medication regimen.   Diabetes . Pharmacist Clinical Goal(s): o Over the next 90 days, patient will work with PharmD and providers to achieve A1c goal <7% . Current regimen:  o Diet and exercise management  (will consider adding Jardiance if needed pending next a1c) . Interventions: o Discussed how to treat hypoglycemia o Collaboration with health plan regarding medication benefit (copay to patient for SGLT2, DPP4, and GLP1) o Collaboration with provider regarding medication management (glipizide replacement with Jardiance)  . Patient self care activities - Over the next 90 days, patient will: o Check blood sugar once daily, document, and provide at future appointments o Contact provider with any episodes of hypoglycemia  Vitamin D Deficiency . Pharmacist Clinical Goal(s) o Over the next 90 days, patient will work with PharmD and providers to maintain vitamin D level within normal limits  . Current regimen:  o None  . Interventions: o Consider completing vitamin D lab at next visit . Patient self care activities - Over the next 90 days, patient will: o Complete vitamin D lab if agreeable with EPercell Miller Tobacco Use Disorder . Pharmacist Clinical Goal(s) o Over the next 90 days, patient will work with PharmD and providers to stop smoking . Current regimen:  o Only smoking after meals (three times daily) . Interventions: o Encouraged patient to call QUITNOW number to get assistance with nicotine patches . Patient self care activities - Over the next 90 days, patient will: o Call 1-800-QUITNOW to get assistance with nicotine patches o Only smoke 2 cigarettes per day (only after 2  meals)   Medication management .  Pharmacist Clinical Goal(s): o Over the next 90 days, patient will work with PharmD and providers to achieve optimal medication adherence . Current pharmacy: [facility] . Interventions o Comprehensive medication review performed. o Continue current medication management strategy . Patient self care activities - Over the next 90 days, patient will: o Focus on medication adherence by filling medications appropriately  o Take medications as prescribed o Report any questions or concerns to PharmD and/or provider(s)  Please see past updates related to this goal by clicking on the "Past Updates" button in the selected goal        Depression Screen PHQ 2/9 Scores 01/28/2020 03/11/2018  PHQ - 2 Score 6 1  PHQ- 9 Score 17 -    Fall Risk No flowsheet data found.  Any stairs in or around the home? {YES/NO:21197} If so, are there any without handrails? {YES/NO:21197} Home free of loose throw rugs in walkways, pet beds, electrical cords, etc? {YES/NO:21197} Adequate lighting in your home to reduce risk of falls? {YES/NO:21197}  ASSISTIVE DEVICES UTILIZED TO PREVENT FALLS:  Life alert? {YES/NO:21197} Use of a cane, walker or w/c? {YES/NO:21197} Grab bars in the bathroom? {YES/NO:21197} Shower chair or bench in shower? {YES/NO:21197} Elevated toilet seat or a handicapped toilet? {YES/NO:21197}  Cognitive Function: Ad8 score reviewed for issues:  Issues making decisions:  Less interest in hobbies / activities:  Repeats questions, stories (family complaining):  Trouble using ordinary gadgets (microwave, computer, phone):  Forgets the month or year:   Mismanaging finances:   Remembering appts:  Daily problems with thinking and/or memory: Ad8 score is=            Immunizations Immunization History  Administered Date(s) Administered  . Influenza Split 07/02/2012  . Influenza Whole 08/01/2009, 08/30/2013  . Tdap 05/19/2016    TDAP status: Up  to date {Covid-19 vaccine status:2101808}   Screening Tests Health Maintenance  Topic Date Due  . PNEUMOCOCCAL POLYSACCHARIDE VACCINE AGE 43-64 HIGH RISK  Never done  . OPHTHALMOLOGY EXAM  Never done  . COVID-19 Vaccine (1) Never done  . COLONOSCOPY  Never done  . FOOT EXAM  05/19/2019  . URINE MICROALBUMIN  05/19/2019  . INFLUENZA VACCINE  05/07/2020  . HEMOGLOBIN A1C  06/25/2020  . TETANUS/TDAP  05/19/2026  . Hepatitis C Screening  Completed  . HIV Screening  Completed    Health Maintenance  Health Maintenance Due  Topic Date Due  . PNEUMOCOCCAL POLYSACCHARIDE VACCINE AGE 43-64 HIGH RISK  Never done  . OPHTHALMOLOGY EXAM  Never done  . COVID-19 Vaccine (1) Never done  . COLONOSCOPY  Never done  . FOOT EXAM  05/19/2019  . URINE MICROALBUMIN  05/19/2019    {Colorectal cancer screening:2101809}  Lung Cancer Screening: (Low Dose CT Chest recommended if Age 30-80 years, 30 pack-year currently smoking OR have quit w/in 15years.) {DOES NOT does:27190::"does not"} qualify.   Lung Cancer Screening Referral: ***  Additional Screening:  Hepatitis C Screening: Completed 09/28/17  Vision Screening: Recommended annual ophthalmology exams for early detection of glaucoma and other disorders of the eye. Is the patient up to date with their annual eye exam?  {YES/NO:21197} Who is the provider or what is the name of the office in which the patient attends annual eye exams? *** If pt is not established with a provider, would they like to be referred to a provider to establish care? {YES/NO:21197}.   Dental Screening: Recommended annual dental exams for proper oral hygiene  Community Resource Referral / Chronic Care Management: CRR  required this visit?  {YES/NO:21197}  CCM required this visit?  {YES/NO:21197}     Plan:     I have personally reviewed and noted the following in the patient's chart:   . Medical and social history . Use of alcohol, tobacco or illicit drugs   . Current medications and supplements . Functional ability and status . Nutritional status . Physical activity . Advanced directives . List of other physicians . Hospitalizations, surgeries, and ER visits in previous 12 months . Vitals . Screenings to include cognitive, depression, and falls . Referrals and appointments  In addition, I have reviewed and discussed with patient certain preventive protocols, quality metrics, and best practice recommendations. A written personalized care plan for preventive services as well as general preventive health recommendations were provided to patient.     Shela Nevin, [place]   03/31/2020   Nurse Notes: ***

## 2020-04-03 ENCOUNTER — Ambulatory Visit: Payer: Medicare Other | Admitting: Medical

## 2020-04-03 ENCOUNTER — Ambulatory Visit: Payer: Medicare Other | Admitting: *Deleted

## 2020-04-07 ENCOUNTER — Ambulatory Visit: Payer: Self-pay | Admitting: Pharmacist

## 2020-04-07 ENCOUNTER — Telehealth: Payer: Medicare Other

## 2020-04-07 NOTE — Chronic Care Management (AMB) (Signed)
°  Chronic Care Management   Outreach Note  04/07/2020 Name: Andrew [name] MRN: [id number] DOB: 11-Aug-1966  Referred by: [name], PA-C Reason for referral : No chief complaint on file.   A second unsuccessful telephone outreach was attempted today. The patient was referred to pharmacist for assistance with care management and care coordination.   Called at 1:03pm and 1:15pm. Noted pt was seen in ED today for pharyngitis.  Follow Up Plan:  -Will try to reach patient before the end of the month  Andrew [name], PharmD Clinical Pharmacist [facility] [phone]

## 2020-04-07 NOTE — Chronic Care Management (AMB) (Deleted)
Chronic Care Management Pharmacy  Name: Andrew [name]  MRN: [id number] DOB: April 16, 1966  Chief Complaint/ HPI  Andrew [name],  54 y.o. , male presents for their Follow-Up CCM visit with the clinical pharmacist via telephone due to COVID-19 Pandemic.  PCP : [name], PA-C  Their chronic conditions include:  Diabetes, Hyperlipidemia, Vitamin D Deficiency, Depression  Office Visits: 02/21/20: Visit w/ [name], PA-C - Recheck a1c at end of June. Consider starting Jardiance if needed. Review of BG today is 80-120. D/C glipizide presently. Prescribed glucerna. Referral to cardio for chest pain. COPD found on chest Xray. Referral to pulmonologist. Syncopal episodes. Referral to neurology. A1c and cmp end of June. Follow up in July or as needed.   02/16/20: CCM Med Coverage Inquiry: Patient can get SGLT2, DDP4, and GLP1 30 DS meds for $4 due to Level 2 Low Income Subsidy. Recommended that patient D/C glipizide and start Jardiance if a1c <9% due to increased risk of urinary side effects when used in patients with a1c >9%.   Consult Visit: 02/24/20: Cardio visit w/ Dr. Percival Spanish - Precordial chest pain. Pain atypical. Significant cardio risk factors. Ordered coronary CTA.   ED Visit:  02/07/20  Medications: Outpatient Encounter Medications as of 04/07/2020  Medication Sig Note  . Accu-Chek Softclix Lancets lancets Check sugar twice a Graciana Sessa.  Dx Code: E11.9   . albuterol (VENTOLIN HFA) 108 (90 Base) MCG/ACT inhaler Inhale 2 puffs into the lungs every 4 (four) hours as needed for wheezing or shortness of breath.   Marland Kitchen atorvastatin (LIPITOR) 10 MG tablet Take 1 tablet (10 mg total) by mouth daily.   Marland Kitchen atorvastatin (LIPITOR) 10 MG tablet Take 1 tablet (10 mg total) by mouth daily.   . fluticasone (FLONASE) 50 MCG/ACT nasal spray Place 1 spray into both nostrils daily.   Marland Kitchen gabapentin (NEURONTIN) 100 MG capsule Take 3 capsules (300 mg total) by mouth at bedtime.   Marland Kitchen glipiZIDE (GLUCOTROL) 5 MG  tablet Take 1 tablet (5 mg total) by mouth daily before breakfast.   . glucose blood (ACCU-CHEK GUIDE) test strip Check sugar twice a Iman Orourke.  Dx Code: E11.9   . glucose monitoring kit (FREESTYLE) monitoring kit 1 each by Does not apply route as needed for other.   . methocarbamol (ROBAXIN) 500 MG tablet Take 1 tablet (500 mg total) by mouth every 8 (eight) hours as needed.   . metoprolol tartrate (LOPRESSOR) 100 MG tablet Take 1 tablet (100 mg total) by mouth once for 1 dose. TAKE 2 HOURS PRIOR TO CTA   . nicotine (NICODERM CQ - DOSED IN MG/24 HOURS) 21 mg/24hr patch Place 1 patch (21 mg total) onto the skin daily.   Marland Kitchen OLANZapine (ZYPREXA) 5 MG tablet Take 5 mg by mouth at bedtime. 01/28/2020: Patient doesn't remember taking these meds  . ondansetron (ZOFRAN ODT) 4 MG disintegrating tablet Take 1 tablet (4 mg total) by mouth every 8 (eight) hours as needed for nausea or vomiting.   Glory Rosebush Delica Lancets 56Y MISC USE TO CHECK BLOOD SUGAR 3 TIMES DAILY. DX E11.9   . sertraline (ZOLOFT) 50 MG tablet Take 50 mg by mouth daily. 01/28/2020: Patient doesn't remember taking these meds  . SPIRIVA HANDIHALER 18 MCG inhalation capsule Place 1 capsule (18 mcg total) into inhaler and inhale daily.   . tadalafil (CIALIS) 10 MG tablet Take 1 tablet (10 mg total) by mouth daily as needed for erectile dysfunction.   Marland Kitchen tiZANidine (ZANAFLEX) 4 MG tablet 1  tab po every 8 hours as needed muscle spasm/neck pain   . traZODone (DESYREL) 50 MG tablet Take 50 mg by mouth at bedtime. 01/28/2020: Patient reports he never took this medication  . Vitamin D, Ergocalciferol, (DRISDOL) 50000 units CAPS capsule Take 1 capsule (50,000 Units total) by mouth every 7 (seven) days.    No facility-administered encounter medications on file as of 04/07/2020.   SDOH Screenings   Alcohol Screen:   . Last Alcohol Screening Score (AUDIT):   Depression (PHQ2-9): Medium Risk  . PHQ-2 Score: 17  Financial Resource Strain:   . Difficulty of  Paying Living Expenses:   Food Insecurity:   . Worried About Charity fundraiser in the Last Year:   . [place] in the Last Year:   Housing:   . Last Housing Risk Score:   Physical Activity:   . Days of Exercise per Week:   . Minutes of Exercise per Session:   Social Connections:   . Frequency of Communication with Friends and Family:   . Frequency of Social Gatherings with Friends and Family:   . Attends Religious Services:   . Active Member of Clubs or Organizations:   . Attends Archivist Meetings:   Marland Kitchen Marital Status:   Stress:   . Feeling of Stress :   Tobacco Use: High Risk  . Smoking Tobacco Use: Current Every Rozena Fierro Smoker  . Smokeless Tobacco Use: Never Used  Transportation Needs:   . Film/video editor (Medical):   Marland Kitchen Lack of Transportation (Non-Medical):      Current Diagnosis/Assessment:  Goals Addressed   None    Social Hx: Had to put his dog to sleep today.  Was institutionalized for 8 months Cuts trees for living.   Diabetes   Recent Relevant Labs: Lab Results  Component Value Date/Time   HGBA1C 9.4 (H) 12/24/2019 11:55 AM   HGBA1C 6.3 09/18/2018 08:24 AM   MICROALBUR 0.9 05/18/2018 09:07 AM    <7% Avg: 155  Checking BG: 2x per Vinh Sachs (sometimes 3 times per Tosca Pletz)  Recent FBG Readings: "98-115"  Patient has failed these meds in past: metformin (GI and pt feels like it will cause cancer) Patient is currently uncontrolled per a1c, but at goal per home BG on the following medications: None (glipizide recently D/C, will consider an SGLT2 pending next a1c)   Last diabetic Eye exam: No results found for: HMDIABEYEEXA  Last diabetic Foot exam: No results found for: HMDIABFOOTEX   Update 02/29/20 Lowest: 65 Highest: 115 Got a BG of 65 last night that made him nauseous. Treated with candy. Wonders about alternatives to candy for treatment of hypoglycemia Does not have meter with him at the moment.  Has another appt today.   We  discussed: how to recognize and treat signs of hypoglycemia  Recommended hypoglycemia alternatives (raisens, glucose tabs, glucose gel)   Plan -Continue control with diet and exercise     Tobacco Use Disorder   Tobacco Status:  Social History   Tobacco Use  Smoking Status Current Every Dustin Bumbaugh Smoker  . Packs/Lysandra Loughmiller: 0.25  . Years: 30.00  . Pack years: 7.50  Smokeless Tobacco Never Used  Tobacco Comment   3 cigarettes per Channon Brougher (after meals)   Tobacco Use Disorder Patient has failed these meds in past: None noted  Patient is currently uncontrolled, but improving on the following medications: None  Patient states he is only smoking after meals.  Plans to smoke only after 2 meals  next week.  Plans to call 1-800-QuitNow today to get free nicotine patches.  Cost of patches are a barrier.   We discussed:  Encourgement of the progress he has made so far with smoking  Plan -Call Quit now number to get assistance with nicotine patches  Hyperlipidemia   Lipid Panel     Component Value Date/Time   CHOL 170 01/24/2020 1524   TRIG 70.0 01/24/2020 1524   HDL 36.90 (L) 01/24/2020 1524   CHOLHDL 5 01/24/2020 1524   VLDL 14.0 01/24/2020 1524   LDLCALC 119 (H) 01/24/2020 1524    LDL goal <100  The 10-year ASCVD risk score Mikey Bussing DC Jr., et al., 2013) is: 19.6%   Values used to calculate the score:     Age: 30 years     Sex: Male     Is Non-Hispanic African American: Yes     Diabetic: Yes     Tobacco smoker: Yes     Systolic Blood Pressure: 142 mmHg     Is BP treated: No     HDL Cholesterol: 36.9 mg/dL     Total Cholesterol: 170 mg/dL   Patient has failed these meds in past: None noted  Patient is currently uncontrolled (has been off of meds for a period of time previous to lab drawn) on the following medications: atorvastatin 53m daily  Per Dispense Report Atorvastatin 01/28/20: 90 DS  Plan -Continue current medications  Vitamin D Deficiency   03/24/18: Vitamin D  03/24/2018  Patient has failed these meds in past: None noted  Patient is currently uncontrolled per most recent lab on the following medications: None  Feels very fatigued.  Does not feel very motivated  We discussed:   Need to assess vitamin D level  Plan -Consider completing vitamin D lab at next lab visit   Depression   Did not fully assess at this visit. Did request for vitamin D to be checked as patient seems unwilling to initiate treatment from depression, has had a low vitamin D level in the past, expressed symptoms of fatigue, and could benefit from vitamin D therapy if his vitamin D is low.   Patient has failed these meds in past: None noted  Patient is currently uncontrolled on the following medications: None  PHQ9: 17 When discussing medication and behavorial health medications "Some things you can't fix"  Patient seems unwilling to consider treatment at current time  We discussed:   Importance of maintaining mental health  Plan -Continue discussion with patient about treatment for depression   Meds to D/C from list Vitamin D (states he isn't taking,may need this for future refill, will leave on list) Atorvastatin 139RV(duplicate) Gabapentin 1202BX(states he isn't taking) Trazodone (states he isn't taking) Tizanidine (states he isn't taking) Cialis (states he isn't taking) Ondansetron (states he isn't taking) Olanzapine (states he isn't taking) Methocarbamol (states he isn't taking)

## 2020-04-11 DIAGNOSIS — R609 Edema, unspecified: Secondary | ICD-10-CM | POA: Diagnosis not present

## 2020-04-11 DIAGNOSIS — L03115 Cellulitis of right lower limb: Secondary | ICD-10-CM | POA: Diagnosis not present

## 2020-04-11 DIAGNOSIS — T63484A Toxic effect of venom of other arthropod, undetermined, initial encounter: Secondary | ICD-10-CM | POA: Diagnosis not present

## 2020-04-11 DIAGNOSIS — R52 Pain, unspecified: Secondary | ICD-10-CM | POA: Diagnosis not present

## 2020-04-11 DIAGNOSIS — W5581XA Bitten by other mammals, initial encounter: Secondary | ICD-10-CM | POA: Diagnosis not present

## 2020-04-13 ENCOUNTER — Telehealth: Payer: Self-pay | Admitting: Medical

## 2020-04-13 NOTE — Progress Notes (Signed)
  Chronic Care Management   Outreach Note  04/13/2020 Name: Andrew [name] MRN: [id number] DOB: Feb 25, 1966  Referred by: [name], PA-C Reason for referral : No chief complaint on file.   An unsuccessful telephone outreach was attempted today. The patient was referred to the pharmacist for assistance with care management and care coordination.   Follow Up Plan:   Earney [place] Upstream Scheduler

## 2020-05-25 DIAGNOSIS — E118 Type 2 diabetes mellitus with unspecified complications: Secondary | ICD-10-CM | POA: Insufficient documentation

## 2020-05-25 NOTE — Progress Notes (Deleted)
Cardiology Office Note   Date:  05/25/2020   ID:  Kinsler, Soeder [birth year], MRN [id number]  PCP:  [name], PA-C  Cardiologist:   No primary care provider on file. Referring:  [name], PA-C  No chief complaint on file.     History of Present Illness: Andrew [name] is a 54 y.o. male who presents for evaluation of chest pain.  The patient was recently in the hospital at [facility].   He was found unresponsive apparently or with decreased responsiveness and was hypoxic with saturations of 88%.  He required oxygen.  I do see an echocardiogram which demonstrated an EF of 55 to 60%.  There is mention in Care Everywhere of an accidental drug overdose but the patient does not know anything about this.    I sent him for a coronary CTA and he had no coronary disease.  He returns for follow up.  ***   *** He is here for evaluation of chest discomfort.  He did have an echocardiogram years ago that I saw was normal.  He thinks he might of had another cardiac work-up but I cannot find this.  He has been getting chest discomfort for couple of months.  This happens with activities.  He works in a [employer].  He says that he might get discomfort when he is doing that kind of activity.  It is a sharp discomfort.  It is mid chest.  He stops what he is doing and recovers in about 5 minutes.  He is getting short of breath climbing up a flight of stairs.  He says he does not have radiation of the discomfort to his jaw or to his arms.  He is not having any resting shortness of breath, PND or orthopnea.  He has had no palpitations, presyncope or syncope.  He unfortunately smokes cigarettes.   Past Medical History:  Diagnosis Date  . Anxiety   . Arthritis    right knee  . Back pain    occasionally  . Bipolar disorder ([place])    denies  . COPD (chronic obstructive pulmonary disease) ([place])   . Depression   . Diabetes mellitus without complication ([place])   . Generalized headaches    history  of migraines-last one 02/02/14  . GERD (gastroesophageal reflux disease)    takes Prilosec daily as needed  . Headache   . Hemorrhoids   . History of bronchitis    1 month ago and treated with Amoxicillin and given an inhaler;takes Prenisone  . Hyperlipidemia    takes Zetia daily  . Hypertension    takes HCTZ daily  . Joint pain   . Pneumonia    hx of 2 yrs ago  . Polysubstance abuse ([place])   . Schizoaffective disorder ([place])    denies  . Tuberculosis    treated for 1 year  . Umbilical hernia     Past Surgical History:  Procedure Laterality Date  . ESOPHAGOGASTRODUODENOSCOPY    . JOINT REPLACEMENT    . KNEE ARTHROSCOPY WITH MEDIAL MENISECTOMY Right 03/01/2014   Procedure: KNEE ARTHROSCOPY WITH MEDIAL MENISECTOMY;  Surgeon: Meredith Pel, MD;  Location: [facility];  Service: Orthopedics;  Laterality: Right;  . LIPOMA EXCISION  ~ 2010   "back left side of my head"  . PARTIAL KNEE ARTHROPLASTY Right 01/24/2015   Procedure: RIGHT KNEE MEDIAL COMPARTMENT REPLACEMENT VS TOTAL KNEE ARTHROPLASTY.;  Surgeon: Meredith Pel, MD;  Location: [facility];  Service: Orthopedics;  Laterality: Right;  RIGHT KNEE MEDIAL COMPARTMENT REPLACEMENT VS TOTAL KNEE ARTHROPLASTY.  . TOTAL KNEE ARTHROPLASTY Right      Current Outpatient Medications  Medication Sig Dispense Refill  . Accu-Chek Softclix Lancets lancets Check sugar twice a day.  Dx Code: E11.9 100 each 1  . albuterol (VENTOLIN HFA) 108 (90 Base) MCG/ACT inhaler Inhale 2 puffs into the lungs every 4 (four) hours as needed for wheezing or shortness of breath. 18 g 0  . atorvastatin (LIPITOR) 10 MG tablet Take 1 tablet (10 mg total) by mouth daily. 30 tablet 3  . atorvastatin (LIPITOR) 10 MG tablet Take 1 tablet (10 mg total) by mouth daily. 90 tablet 3  . fluticasone (FLONASE) 50 MCG/ACT nasal spray Place 1 spray into both nostrils daily. 16 g 0  . gabapentin (NEURONTIN) 100 MG capsule Take 3 capsules (300 mg total) by mouth at bedtime. 30  capsule 0  . glipiZIDE (GLUCOTROL) 5 MG tablet Take 1 tablet (5 mg total) by mouth daily before breakfast. 30 tablet 3  . glucose blood (ACCU-CHEK GUIDE) test strip Check sugar twice a day.  Dx Code: E11.9 100 each 1  . glucose monitoring kit (FREESTYLE) monitoring kit 1 each by Does not apply route as needed for other. 1 each 0  . methocarbamol (ROBAXIN) 500 MG tablet Take 1 tablet (500 mg total) by mouth every 8 (eight) hours as needed. 60 tablet 1  . metoprolol tartrate (LOPRESSOR) 100 MG tablet Take 1 tablet (100 mg total) by mouth once for 1 dose. TAKE 2 HOURS PRIOR TO CTA 1 tablet 0  . nicotine (NICODERM CQ - DOSED IN MG/24 HOURS) 21 mg/24hr patch Place 1 patch (21 mg total) onto the skin daily. 28 patch 0  . OLANZapine (ZYPREXA) 5 MG tablet Take 5 mg by mouth at bedtime.    . ondansetron (ZOFRAN ODT) 4 MG disintegrating tablet Take 1 tablet (4 mg total) by mouth every 8 (eight) hours as needed for nausea or vomiting. 5 tablet 0  . OneTouch Delica Lancets 63F MISC USE TO CHECK BLOOD SUGAR 3 TIMES DAILY. DX E11.9 100 each 3  . sertraline (ZOLOFT) 50 MG tablet Take 50 mg by mouth daily.    Marland Kitchen SPIRIVA HANDIHALER 18 MCG inhalation capsule Place 1 capsule (18 mcg total) into inhaler and inhale daily. 30 capsule 0  . tadalafil (CIALIS) 10 MG tablet Take 1 tablet (10 mg total) by mouth daily as needed for erectile dysfunction. 10 tablet 0  . tiZANidine (ZANAFLEX) 4 MG tablet 1 tab po every 8 hours as needed muscle spasm/neck pain 30 tablet 0  . traZODone (DESYREL) 50 MG tablet Take 50 mg by mouth at bedtime.    . Vitamin D, Ergocalciferol, (DRISDOL) 50000 units CAPS capsule Take 1 capsule (50,000 Units total) by mouth every 7 (seven) days. 8 capsule 0   No current facility-administered medications for this visit.    Allergies:   Mushroom extract complex, Penicillins, Other, and Penicillins   ROS:  Please see the history of present illness.   Otherwise, review of systems are positive for  migraines.   All other systems are reviewed and negative.    PHYSICAL EXAM: VS:  There were no vitals taken for this visit. , BMI There is no height or weight on file to calculate BMI. GENERAL:  Well appearing NECK:  No jugular venous distention, waveform within normal limits, carotid upstroke brisk and symmetric, no bruits, no thyromegaly LUNGS:  Clear to auscultation bilaterally CHEST:  Unremarkable HEART:  PMI not displaced or sustained,S1 and S2 within normal limits, no S3, no S4, no clicks, no rubs, *** murmurs ABD:  Flat, positive bowel sounds normal in frequency in pitch, no bruits, no rebound, no guarding, no midline pulsatile mass, no hepatomegaly, no splenomegaly EXT:  2 plus pulses throughout, no edema, no cyanosis no clubbing    ***GENERAL:  Well appearing HEENT:  Pupils equal round and reactive, fundi not visualized, oral mucosa unremarkable NECK:  No jugular venous distention, waveform within normal limits, carotid upstroke brisk and symmetric, no bruits, no thyromegaly LYMPHATICS:  No cervical, inguinal adenopathy LUNGS:  Clear to auscultation bilaterally BACK:  No CVA tenderness CHEST:  Unremarkable HEART:  PMI not displaced or sustained,S1 and S2 within normal limits, no S3, no S4, no clicks, no rubs, no murmurs ABD:  Flat, positive bowel sounds normal in frequency in pitch, no bruits, no rebound, no guarding, no midline pulsatile mass, no hepatomegaly, no splenomegaly EXT:  2 plus pulses throughout, no edema, no cyanosis no clubbing SKIN:  No rashes no nodules NEURO:  Cranial nerves II through XII grossly intact, motor grossly intact throughout PSYCH:  Cognitively intact, oriented to person place and time    EKG:  EKG is *** ordered today. The ekg ordered today demonstrates sinus rhythm, rate ***, left axis deviation, poor anterior R wave progression, no acute ST-T wave changes.   Recent Labs: 12/24/2019: ALT 33 03/21/2020: BUN 14; Creatinine, Ser 1.01; Potassium  4.6; Sodium 143    Lipid Panel    Component Value Date/Time   CHOL 170 01/24/2020 1524   TRIG 70.0 01/24/2020 1524   HDL 36.90 (L) 01/24/2020 1524   CHOLHDL 5 01/24/2020 1524   VLDL 14.0 01/24/2020 1524   LDLCALC 119 (H) 01/24/2020 1524      Wt Readings from Last 3 Encounters:  02/24/20 232 lb 3.2 oz (105.3 kg)  02/21/20 230 lb 3.2 oz (104.4 kg)  12/24/19 235 lb (106.6 kg)      Other studies Reviewed: Additional studies/ records that were reviewed today include: *** Review of the above records demonstrates:  Please see elsewhere in the note.     ASSESSMENT AND PLAN:  Chest pain:   ***  His pain is somewhat atypical.  However, he has significant cardiovascular risk factors.  Pretest probability is moderate for obstructive coronary disease as the etiology.  I am going to order a coronary CTA.  DM:  His A1c was *** 9.2.  He thinks it is better now.  I will defer to his primary provider.  Tobacco :  ***   I gave him a prescription for nicotine patches as well as 1800 QUITNOW.Bernerd Pho education:  ***   He is not interested in the vaccine.     Current medicines are reviewed at length with the patient today.  The patient does not have concerns regarding medicines.  The following changes have been made:  no change  Labs/ tests ordered today include:   No orders of the defined types were placed in this encounter.    Disposition:   FU with me in six months.     Signed, Minus Breeding, MD  05/25/2020 9:18 PM    [facility]

## 2020-05-26 ENCOUNTER — Ambulatory Visit: Payer: Medicare Other | Admitting: Cardiology

## 2020-05-26 DIAGNOSIS — E118 Type 2 diabetes mellitus with unspecified complications: Secondary | ICD-10-CM

## 2020-05-26 DIAGNOSIS — R072 Precordial pain: Secondary | ICD-10-CM

## 2020-05-26 DIAGNOSIS — Z720 Tobacco use: Secondary | ICD-10-CM

## 2020-06-21 ENCOUNTER — Telehealth: Payer: Self-pay | Admitting: Pharmacist

## 2020-06-21 NOTE — Progress Notes (Addendum)
Chronic Care Management Pharmacy Assistant   Name: Andrew [name]  MRN: [id number] DOB: 05-24-1966  Reason for Encounter: Disease State  Patient Questions:  1.  Have you seen any other providers since your last visit? No  2.  Any changes in your medicines or health? No   PCP : [name], PA-C   Their chronic conditions include:  Diabetes, Hyperlipidemia, Vitamin D Deficiency, Depression  Consults: 03-23-2020 (Cardiology) Patient met inclusion and exclusion criteria for CADFEM reasearch trial.   Hospitalizations: 04-11-2020 ([facility]) Patient reported an insect bite. Provider ordered clindamycin 300 mg one cap by mouth three time daily for 7 days. 04-07-2020 ([facility]) Patient reported cough and sore throat.  Provider ordered albuterol 90 mcg inhaler 2 puffs every 4 hours as needed for wheezing or shortness of breath and benzonatate 100 mg one cap by mouth every 8 hours for 7 days. 03-16-2020 ([facility]) Patient reported chest pain, shortness of breath and a rash.  Kenalog cream was prescribed.  No office visits since last CCM visit on 02-29-2020.  Allergies:   Allergies  Allergen Reactions  . Mushroom Extract Complex Hives  . Penicillins Hives  . Other Other (See Comments)    Mushroom: Hives  . Penicillins Hives    Has patient had a PCN reaction causing immediate rash, facial/tongue/throat swelling, SOB or lightheadedness with hypotension: no Has patient had a PCN reaction causing severe rash involving mucus membranes or skin necrosis: no Has patient had a PCN reaction that required hospitalization: no Has patient had a PCN reaction occurring within the last 10 years: yes If all of the above answers are "NO", then may proceed with Cephalosporin use.    Medications: Outpatient Encounter Medications as of 06/21/2020  Medication Sig Note  . Accu-Chek Softclix Lancets lancets Check sugar twice  a day.  Dx Code: E11.9   . albuterol (VENTOLIN HFA) 108 (90 Base) MCG/ACT inhaler Inhale 2 puffs into the lungs every 4 (four) hours as needed for wheezing or shortness of breath.   Marland Kitchen atorvastatin (LIPITOR) 10 MG tablet Take 1 tablet (10 mg total) by mouth daily.   Marland Kitchen atorvastatin (LIPITOR) 10 MG tablet Take 1 tablet (10 mg total) by mouth daily.   . fluticasone (FLONASE) 50 MCG/ACT nasal spray Place 1 spray into both nostrils daily.   Marland Kitchen gabapentin (NEURONTIN) 100 MG capsule Take 3 capsules (300 mg total) by mouth at bedtime.   Marland Kitchen glipiZIDE (GLUCOTROL) 5 MG tablet Take 1 tablet (5 mg total) by mouth daily before breakfast.   . glucose blood (ACCU-CHEK GUIDE) test strip Check sugar twice a day.  Dx Code: E11.9   . glucose monitoring kit (FREESTYLE) monitoring kit 1 each by Does not apply route as needed for other.   . methocarbamol (ROBAXIN) 500 MG tablet Take 1 tablet (500 mg total) by mouth every 8 (eight) hours as needed.   . metoprolol tartrate (LOPRESSOR) 100 MG tablet Take 1 tablet (100 mg total) by mouth once for 1 dose. TAKE 2 HOURS PRIOR TO CTA   . nicotine (NICODERM CQ - DOSED IN MG/24 HOURS) 21 mg/24hr patch Place 1 patch (21 mg total) onto the skin daily.   Marland Kitchen OLANZapine (ZYPREXA) 5 MG tablet Take 5 mg by mouth at bedtime. 01/28/2020: Patient doesn't remember taking these meds  . ondansetron (ZOFRAN ODT) 4 MG disintegrating tablet Take 1 tablet (4 mg total) by mouth every 8 (eight) hours as needed  for nausea or vomiting.   Glory Rosebush Delica Lancets 24M MISC USE TO CHECK BLOOD SUGAR 3 TIMES DAILY. DX E11.9   . sertraline (ZOLOFT) 50 MG tablet Take 50 mg by mouth daily. 01/28/2020: Patient doesn't remember taking these meds  . SPIRIVA HANDIHALER 18 MCG inhalation capsule Place 1 capsule (18 mcg total) into inhaler and inhale daily.   . tadalafil (CIALIS) 10 MG tablet Take 1 tablet (10 mg total) by mouth daily as needed for erectile dysfunction.   Marland Kitchen tiZANidine (ZANAFLEX) 4 MG tablet 1 tab po  every 8 hours as needed muscle spasm/neck pain   . traZODone (DESYREL) 50 MG tablet Take 50 mg by mouth at bedtime. 01/28/2020: Patient reports he never took this medication  . Vitamin D, Ergocalciferol, (DRISDOL) 50000 units CAPS capsule Take 1 capsule (50,000 Units total) by mouth every 7 (seven) days.    No facility-administered encounter medications on file as of 06/21/2020.    Current Diagnosis: Patient Active Problem List   Diagnosis Date Noted  . Type 2 diabetes mellitus with complication, without long-term current use of insulin ([employer]) 05/25/2020  . Educated about COVID-19 virus infection 02/23/2020  . Crack cocaine overdose ([place]) 09/28/2017  . Hypoglycemia 09/28/2017  . Hepatitis 09/28/2017  . Leucocytosis 09/28/2017  . Overdose 09/28/2017  . Arthritis of knee, degenerative 01/24/2015  . Right knee pain 01/07/2014  . Concern about STD in male without diagnosis 01/06/2014  . Dysuria 12/09/2013  . High cholesterol 10/25/2013  . GERD (gastroesophageal reflux disease) 09/20/2013  . History of positive PPD 07/17/2012  . Cocaine abuse ([place]) 04/02/2012  . Rhabdomyolysis 04/02/2012  . Hernia of abdominal wall 01/05/2011  . ERECTILE DYSFUNCTION, PSYCHOGENIC 02/13/2010  . BIPOLAR DISORDER UNSPECIFIED 01/31/2009  . TOBACCO ABUSE 01/31/2009  . ELEVATED BP READING WITHOUT DX HYPERTENSION 01/31/2009    Goals Addressed   None    Recent Relevant Labs: Lab Results  Component Value Date/Time   HGBA1C 9.4 (H) 12/24/2019 11:55 AM   HGBA1C 6.3 09/18/2018 08:24 AM   MICROALBUR 0.9 05/18/2018 09:07 AM    Kidney Function Lab Results  Component Value Date/Time   CREATININE 1.01 03/21/2020 12:19 PM   CREATININE 1.00 12/24/2019 11:55 AM   CREATININE 0.85 12/09/2013 11:02 AM   CREATININE 0.80 09/20/2013 10:57 AM   GFR 94.10 12/24/2019 11:55 AM   GFRNONAA 84 03/21/2020 12:19 PM   GFRAA 97 03/21/2020 12:19 PM    . Current antihyperglycemic regimen:  ? Diet and exercise management   (will consider adding Jardiance if needed pending next a1c) . What recent interventions/DTPs have been made to improve glycemic control:  o Patient states he stopped taking the glipizide 5 mg in July. . Have there been any recent hospitalizations or ED visits since last visit with CPP? Yes . Patient denies hypoglycemic symptoms. . Patient denies hyperglycemic symptoms. . How often are you checking your blood sugar? twice daily . What are your blood sugars ranging?  o Patient states his sugar ranges from 100-120 . During the week, how often does your blood glucose drop below 70? Never . Are you checking your feet daily/regularly?   Yes Patients states he checks his feet every day when he gets out the shower.  Patient states he needs to see a podiatrist  due to numbness and tingling in feet.  Patient reported he brought some sketchers shoes but he needs some diabetic shoes  Adherence Review: Patient states he watches what he eats.  He expresses that he would like  a nebulizer machine to help with his breathing.   He states he is a smoker but trying to stop due to price of cigarettes. Is the patient currently on a STATIN medication? Yes, Atorvastatin 10 mg daily Is the patient currently on ACE/ARB medication? No Does the patient have >5 day gap between last estimated fill dates? No      Follow-Up:  Pharmacist Review   [place] Shannon, [facility] [place] [phone]   Patient no showed last CCM visit scheduled in July. Will need to be scheduled for follow up.  Will coordinate with team to have pt scheduled. Reviewed by: De Blanch, PharmD Clinical Pharmacist [facility] [phone]

## 2020-06-27 ENCOUNTER — Telehealth: Payer: Self-pay | Admitting: Pharmacist

## 2020-06-27 NOTE — Progress Notes (Signed)
Chronic Care Management Pharmacy Assistant   Name: Andrew [name]  MRN: [id number] DOB: Mar 27, 1966  Reason for Encounter: Disease state  PCP : [name], PA-C   Their chronic conditions include: Diabetes, Hyperlipidemia, Vitamin D Deficiency, Depression  Allergies:   Allergies  Allergen Reactions  . Mushroom Extract Complex Hives  . Penicillins Hives  . Other Other (See Comments)    Mushroom: Hives  . Penicillins Hives    Has patient had a PCN reaction causing immediate rash, facial/tongue/throat swelling, SOB or lightheadedness with hypotension: no Has patient had a PCN reaction causing severe rash involving mucus membranes or skin necrosis: no Has patient had a PCN reaction that required hospitalization: no Has patient had a PCN reaction occurring within the last 10 years: yes If all of the above answers are "NO", then may proceed with Cephalosporin use.    Medications: Outpatient Encounter Medications as of 06/27/2020  Medication Sig Note  . Accu-Chek Softclix Lancets lancets Check sugar twice a day.  Dx Code: E11.9   . albuterol (VENTOLIN HFA) 108 (90 Base) MCG/ACT inhaler Inhale 2 puffs into the lungs every 4 (four) hours as needed for wheezing or shortness of breath.   Marland Kitchen atorvastatin (LIPITOR) 10 MG tablet Take 1 tablet (10 mg total) by mouth daily.   Marland Kitchen atorvastatin (LIPITOR) 10 MG tablet Take 1 tablet (10 mg total) by mouth daily.   . fluticasone (FLONASE) 50 MCG/ACT nasal spray Place 1 spray into both nostrils daily.   Marland Kitchen gabapentin (NEURONTIN) 100 MG capsule Take 3 capsules (300 mg total) by mouth at bedtime.   Marland Kitchen glipiZIDE (GLUCOTROL) 5 MG tablet Take 1 tablet (5 mg total) by mouth daily before breakfast.   . glucose blood (ACCU-CHEK GUIDE) test strip Check sugar twice a day.  Dx Code: E11.9   . glucose monitoring kit (FREESTYLE) monitoring kit 1 each by Does not apply route as needed for other.   . methocarbamol (ROBAXIN) 500 MG tablet Take 1 tablet (500 mg  total) by mouth every 8 (eight) hours as needed.   . metoprolol tartrate (LOPRESSOR) 100 MG tablet Take 1 tablet (100 mg total) by mouth once for 1 dose. TAKE 2 HOURS PRIOR TO CTA   . nicotine (NICODERM CQ - DOSED IN MG/24 HOURS) 21 mg/24hr patch Place 1 patch (21 mg total) onto the skin daily.   Marland Kitchen OLANZapine (ZYPREXA) 5 MG tablet Take 5 mg by mouth at bedtime. 01/28/2020: Patient doesn't remember taking these meds  . ondansetron (ZOFRAN ODT) 4 MG disintegrating tablet Take 1 tablet (4 mg total) by mouth every 8 (eight) hours as needed for nausea or vomiting.   Glory Rosebush Delica Lancets 12Y MISC USE TO CHECK BLOOD SUGAR 3 TIMES DAILY. DX E11.9   . sertraline (ZOLOFT) 50 MG tablet Take 50 mg by mouth daily. 01/28/2020: Patient doesn't remember taking these meds  . SPIRIVA HANDIHALER 18 MCG inhalation capsule Place 1 capsule (18 mcg total) into inhaler and inhale daily.   . tadalafil (CIALIS) 10 MG tablet Take 1 tablet (10 mg total) by mouth daily as needed for erectile dysfunction.   Marland Kitchen tiZANidine (ZANAFLEX) 4 MG tablet 1 tab po every 8 hours as needed muscle spasm/neck pain   . traZODone (DESYREL) 50 MG tablet Take 50 mg by mouth at bedtime. 01/28/2020: Patient reports he never took this medication  . Vitamin D, Ergocalciferol, (DRISDOL) 50000 units CAPS capsule Take 1 capsule (50,000 Units total) by mouth every 7 (seven) days.  No facility-administered encounter medications on file as of 06/27/2020.    Current Diagnosis: Patient Active Problem List   Diagnosis Date Noted  . Type 2 diabetes mellitus with complication, without long-term current use of insulin ([place]) 05/25/2020  . Educated about COVID-19 virus infection 02/23/2020  . Crack cocaine overdose ([place]) 09/28/2017  . Hypoglycemia 09/28/2017  . Hepatitis 09/28/2017  . Leucocytosis 09/28/2017  . Overdose 09/28/2017  . Arthritis of knee, degenerative 01/24/2015  . Right knee pain 01/07/2014  . Concern about STD in male without diagnosis  01/06/2014  . Dysuria 12/09/2013  . High cholesterol 10/25/2013  . GERD (gastroesophageal reflux disease) 09/20/2013  . History of positive PPD 07/17/2012  . Cocaine abuse ([place]) 04/02/2012  . Rhabdomyolysis 04/02/2012  . Hernia of abdominal wall 01/05/2011  . ERECTILE DYSFUNCTION, PSYCHOGENIC 02/13/2010  . BIPOLAR DISORDER UNSPECIFIED 01/31/2009  . TOBACCO ABUSE 01/31/2009  . ELEVATED BP READING WITHOUT DX HYPERTENSION 01/31/2009    Goals Addressed   None     Chronic Care Management   Outreach Note  07/06/2020 Name: Andrew [name] MRN: [id number] DOB: 04-10-1966  Referred by: [name], PA-C Reason for referral : Chronic Care Management   Third unsuccessful telephone outreach was attempted today. The patient was referred to the pharmacist for assistance with care management and care coordination.    Follow-Up:  Scheduled Follow-Up With Clinical Pharmacist   [place] Shannon, [facility] [place] Pharmacist Assistant [phone]

## 2020-07-05 ENCOUNTER — Telehealth: Payer: Self-pay | Admitting: Medical

## 2020-07-05 NOTE — Telephone Encounter (Signed)
Appt made for 10/6

## 2020-07-05 NOTE — Telephone Encounter (Signed)
Pt also states he isnt taking his medications but his sugars are doing ok no higher than 120 , and wants to know is It ok that he isnt taking any medication

## 2020-07-05 NOTE — Telephone Encounter (Signed)
Patient called back ,patient would like a return call

## 2020-07-05 NOTE — Telephone Encounter (Signed)
Called pt and spoke with him .

## 2020-07-05 NOTE — Telephone Encounter (Signed)
Pt last a1c about 6 months ago was 9.4. He needs to check sugar daily and schedule for follow up with me next week.  Will get a1c.  Based on a1c and sugar reading can advise. Discuss covid vaccine and flu vaccine at that time.

## 2020-07-05 NOTE — Telephone Encounter (Signed)
Called pt and lvm to return call.  

## 2020-07-05 NOTE — Telephone Encounter (Signed)
Caller name: Jaiceon Call back number: [phone]  Patient would like to speak to someone in regards to the shingle & Covid vaccine

## 2020-07-07 ENCOUNTER — Other Ambulatory Visit (HOSPITAL_BASED_OUTPATIENT_CLINIC_OR_DEPARTMENT_OTHER): Payer: Self-pay | Admitting: Internal Medicine

## 2020-07-07 ENCOUNTER — Ambulatory Visit: Payer: Medicare Other | Attending: Internal Medicine

## 2020-07-07 DIAGNOSIS — Z23 Encounter for immunization: Secondary | ICD-10-CM

## 2020-07-07 NOTE — Progress Notes (Signed)
   Covid-19 Vaccination Clinic  Name:  ZAKYE BABY    MRN: [id number] DOB: [birth year]  07/07/2020  Mr. Bozard was observed post Covid-19 immunization for 15 minutes without incident. He was provided with Vaccine Information Sheet and instruction to access the V-Safe system.   Mr. Tomkins was instructed to call 911 with any severe reactions post vaccine: Marland Kitchen Difficulty breathing  . Swelling of face and throat  . A fast heartbeat  . A bad rash all over body  . Dizziness and weakness   Immunizations Administered    Name Date Dose VIS Date Route   Moderna COVID-19 Vaccine 07/07/2020  2:46 PM 0.5 mL 09/2019 Intramuscular   Manufacturer: Moderna   Lot: [id number]   [place]: [id number]

## 2020-07-12 ENCOUNTER — Ambulatory Visit: Payer: Medicare Other | Admitting: Medical

## 2020-07-12 ENCOUNTER — Telehealth: Payer: Self-pay | Admitting: Medical

## 2020-07-12 NOTE — Telephone Encounter (Signed)
How many times has pt no showed?

## 2020-07-13 NOTE — Telephone Encounter (Signed)
This pt has had numerous no shows. I just don't know how many. I think best to be dismissed. However he does have appointment on I think oct 19,2021. Can you tell me how many no shows. By memory seems like 4 or more? Am I right?

## 2020-07-17 ENCOUNTER — Institutional Professional Consult (permissible substitution): Payer: Medicare Other | Admitting: Critical Care Medicine

## 2020-07-17 ENCOUNTER — Institutional Professional Consult (permissible substitution): Payer: Medicare Other | Admitting: Pulmonary Disease

## 2020-07-18 ENCOUNTER — Encounter: Payer: Self-pay | Admitting: Medical

## 2020-07-19 NOTE — Telephone Encounter (Signed)
Appt canceled and dismissal letter sent via [place] and via [place].

## 2020-07-25 ENCOUNTER — Ambulatory Visit: Payer: Medicare Other | Admitting: Medical

## 2020-09-11 ENCOUNTER — Emergency Department (HOSPITAL_COMMUNITY): Payer: Medicare Other

## 2020-09-11 ENCOUNTER — Emergency Department (HOSPITAL_COMMUNITY)
Admission: EM | Admit: 2020-09-11 | Discharge: 2020-09-11 | Disposition: A | Discharge status: Home / Self Care | Payer: Medicare Other | Attending: Emergency Medicine | Admitting: Emergency Medicine

## 2020-09-11 DIAGNOSIS — E119 Type 2 diabetes mellitus without complications: Secondary | ICD-10-CM | POA: Insufficient documentation

## 2020-09-11 DIAGNOSIS — R059 Cough, unspecified: Secondary | ICD-10-CM | POA: Diagnosis not present

## 2020-09-11 DIAGNOSIS — J9811 Atelectasis: Secondary | ICD-10-CM | POA: Diagnosis not present

## 2020-09-11 DIAGNOSIS — E11621 Type 2 diabetes mellitus with foot ulcer: Secondary | ICD-10-CM | POA: Diagnosis not present

## 2020-09-11 DIAGNOSIS — I10 Essential (primary) hypertension: Secondary | ICD-10-CM | POA: Diagnosis not present

## 2020-09-11 DIAGNOSIS — M79671 Pain in right foot: Secondary | ICD-10-CM

## 2020-09-11 DIAGNOSIS — S91301A Unspecified open wound, right foot, initial encounter: Secondary | ICD-10-CM | POA: Diagnosis not present

## 2020-09-11 DIAGNOSIS — Z7984 Long term (current) use of oral hypoglycemic drugs: Secondary | ICD-10-CM | POA: Diagnosis not present

## 2020-09-11 DIAGNOSIS — Z7951 Long term (current) use of inhaled steroids: Secondary | ICD-10-CM | POA: Diagnosis not present

## 2020-09-11 DIAGNOSIS — F17210 Nicotine dependence, cigarettes, uncomplicated: Secondary | ICD-10-CM | POA: Insufficient documentation

## 2020-09-11 DIAGNOSIS — J449 Chronic obstructive pulmonary disease, unspecified: Secondary | ICD-10-CM | POA: Diagnosis not present

## 2020-09-11 DIAGNOSIS — Z79899 Other long term (current) drug therapy: Secondary | ICD-10-CM | POA: Insufficient documentation

## 2020-09-11 DIAGNOSIS — Z20822 Contact with and (suspected) exposure to covid-19: Secondary | ICD-10-CM | POA: Diagnosis not present

## 2020-09-11 DIAGNOSIS — Z96651 Presence of right artificial knee joint: Secondary | ICD-10-CM | POA: Insufficient documentation

## 2020-09-11 DIAGNOSIS — J069 Acute upper respiratory infection, unspecified: Secondary | ICD-10-CM

## 2020-09-11 DIAGNOSIS — J439 Emphysema, unspecified: Secondary | ICD-10-CM | POA: Diagnosis not present

## 2020-09-11 DIAGNOSIS — B9789 Other viral agents as the cause of diseases classified elsewhere: Secondary | ICD-10-CM | POA: Diagnosis not present

## 2020-09-11 DIAGNOSIS — R0789 Other chest pain: Secondary | ICD-10-CM | POA: Diagnosis not present

## 2020-09-11 DIAGNOSIS — R0602 Shortness of breath: Secondary | ICD-10-CM | POA: Diagnosis not present

## 2020-09-11 DIAGNOSIS — R079 Chest pain, unspecified: Secondary | ICD-10-CM | POA: Diagnosis not present

## 2020-09-11 LAB — COMPREHENSIVE METABOLIC PANEL
ALT: 11 U/L (ref 0–44)
AST: 19 U/L (ref 15–41)
Albumin: 3.2 g/dL — ABNORMAL LOW (ref 3.5–5.0)
Alkaline Phosphatase: 77 U/L (ref 38–126)
Anion gap: 10 (ref 5–15)
BUN: 11 mg/dL (ref 6–20)
CO2: 26 mmol/L (ref 22–32)
Calcium: 9.2 mg/dL (ref 8.9–10.3)
Chloride: 103 mmol/L (ref 98–111)
Creatinine, Ser: 0.89 mg/dL (ref 0.61–1.24)
GFR, Estimated: >60 mL/min (ref >60)
Glucose, Bld: 127 mg/dL — ABNORMAL HIGH (ref 70–99)
Potassium: 4 mmol/L (ref 3.5–5.1)
Sodium: 139 mmol/L (ref 135–145)
Total Bilirubin: 0.2 mg/dL — ABNORMAL LOW (ref 0.3–1.2)
Total Protein: 7.1 g/dL (ref 6.5–8.1)

## 2020-09-11 LAB — CBC
HCT: 45.7 % (ref 39.0–52.0)
Hemoglobin: 15.1 g/dL (ref 13.0–17.0)
MCH: 31.3 pg (ref 26.0–34.0)
MCHC: 33 g/dL (ref 30.0–36.0)
MCV: 94.8 fL (ref 80.0–100.0)
Platelets: 408 10*3/uL — ABNORMAL HIGH (ref 150–400)
RBC: 4.82 MIL/uL (ref 4.22–5.81)
RDW: 14.4 % (ref 11.5–15.5)
WBC: 8.8 10*3/uL (ref 4.0–10.5)
nRBC: 0 % (ref 0.0–0.2)

## 2020-09-11 LAB — RESP PANEL BY RT-PCR (FLU A&B, COVID) ARPGX2
Influenza A by PCR: NEGATIVE
Influenza B by PCR: NEGATIVE
SARS Coronavirus 2 by RT PCR: NEGATIVE

## 2020-09-11 MED ORDER — ACETAMINOPHEN 500 MG PO TABS
1000.0000 mg | ORAL_TABLET | Freq: Once | ORAL | Status: AC
  Administered 2020-09-11: 1000 mg via ORAL
  Filled 2020-09-11: qty 2

## 2020-09-11 MED ORDER — DEXAMETHASONE SODIUM PHOSPHATE 10 MG/ML IJ SOLN
6.0000 mg | Freq: Once | INTRAMUSCULAR | Status: AC
  Administered 2020-09-11: 6 mg via INTRAMUSCULAR
  Filled 2020-09-11: qty 1

## 2020-09-11 MED ORDER — COLCHICINE 0.6 MG PO TABS: 0.6000 mg | ORAL_TABLET | Freq: Once | ORAL | Status: DC

## 2020-09-11 MED ORDER — COLCHICINE 0.6 MG PO TABS
1.2000 mg | ORAL_TABLET | Freq: Once | ORAL | Status: AC
  Administered 2020-09-11: 1.2 mg via ORAL
  Filled 2020-09-11: qty 2

## 2020-09-11 MED ORDER — COLCHICINE 0.6 MG PO TABS: 0.6000 mg | ORAL_TABLET | Freq: Two times a day (BID) | ORAL | 0 refills | Status: DC

## 2020-09-11 NOTE — Discharge Instructions (Addendum)
Take the colchicine as soon as possible after leaving here, then take once to twice daily (every 12 hours) until symptoms improve. Follow closely with primary care.  Return to fever or worsening symptoms.

## 2020-09-11 NOTE — ED Provider Notes (Signed)
[facility] EMERGENCY DEPARTMENT Provider Note   CSN: [id number] Arrival date & time: 09/11/20  7253     History Chief Complaint  Patient presents with  . Cough  . Foot Pain    Andrew [name] is a 54 y.o. male w PMHx COPD, polysubstance abuse, T2DM, HTN, schizoaffective d/o, presenting to the ED with multiple complaints.  Patient states he has been feeling ill for about 1 week with dry cough, rhinorrhea, dry throat, shortness of breath.  He has had chills no documented fevers.  No known Covid exposures.  Has completed Covid vaccines. Patient also reports chronic bilateral foot pain due to diabetes though is reporting wounds and increased pain.  He is homeless and is on his feet quite frequently.  He has not treated his symptoms with any interventions for his symptoms.  The history is provided by the patient.       Past Medical History:  Diagnosis Date  . Anxiety   . Arthritis    right knee  . Back pain    occasionally  . Bipolar disorder ([place])    denies  . COPD (chronic obstructive pulmonary disease) ([place])   . Depression   . Diabetes mellitus without complication ([place])   . Generalized headaches    history of migraines-last one 02/02/14  . GERD (gastroesophageal reflux disease)    takes Prilosec daily as needed  . Headache   . Hemorrhoids   . History of bronchitis    1 month ago and treated with Amoxicillin and given an inhaler;takes Prenisone  . Hyperlipidemia    takes Zetia daily  . Hypertension    takes HCTZ daily  . Joint pain   . Pneumonia    hx of 2 yrs ago  . Polysubstance abuse ([place])   . Schizoaffective disorder ([place])    denies  . Tuberculosis    treated for 1 year  . Umbilical hernia     Patient Active Problem List   Diagnosis Date Noted  . Type 2 diabetes mellitus with complication, without long-term current use of insulin ([place]) 05/25/2020  . Educated about COVID-19 virus infection 02/23/2020  . Crack cocaine overdose ([place])  09/28/2017  . Hypoglycemia 09/28/2017  . Hepatitis 09/28/2017  . Leucocytosis 09/28/2017  . Overdose 09/28/2017  . Arthritis of knee, degenerative 01/24/2015  . Right knee pain 01/07/2014  . Concern about STD in male without diagnosis 01/06/2014  . Dysuria 12/09/2013  . High cholesterol 10/25/2013  . GERD (gastroesophageal reflux disease) 09/20/2013  . History of positive PPD 07/17/2012  . Cocaine abuse ([place]) 04/02/2012  . Rhabdomyolysis 04/02/2012  . Hernia of abdominal wall 01/05/2011  . ERECTILE DYSFUNCTION, PSYCHOGENIC 02/13/2010  . BIPOLAR DISORDER UNSPECIFIED 01/31/2009  . TOBACCO ABUSE 01/31/2009  . ELEVATED BP READING WITHOUT DX HYPERTENSION 01/31/2009    Past Surgical History:  Procedure Laterality Date  . ESOPHAGOGASTRODUODENOSCOPY    . JOINT REPLACEMENT    . KNEE ARTHROSCOPY WITH MEDIAL MENISECTOMY Right 03/01/2014   Procedure: KNEE ARTHROSCOPY WITH MEDIAL MENISECTOMY;  Surgeon: Meredith Pel, MD;  Location: [facility];  Service: Orthopedics;  Laterality: Right;  . LIPOMA EXCISION  ~ 2010   "back left side of my head"  . PARTIAL KNEE ARTHROPLASTY Right 01/24/2015   Procedure: RIGHT KNEE MEDIAL COMPARTMENT REPLACEMENT VS TOTAL KNEE ARTHROPLASTY.;  Surgeon: Meredith Pel, MD;  Location: [facility];  Service: Orthopedics;  Laterality: Right;  RIGHT KNEE MEDIAL COMPARTMENT REPLACEMENT VS TOTAL KNEE ARTHROPLASTY.  . TOTAL KNEE ARTHROPLASTY  Right        Family History  Problem Relation Age of Onset  . Hypertension Mother   . Cancer Mother   . Heart disease Father   . Heart attack Father 65  . Hyperlipidemia Father   . Hypertension Father   . Diabetes Sister   . Diabetes Brother     Social History   Tobacco Use  . Smoking status: Current Every Day Smoker    Packs/day: 0.25    Years: 30.00    Pack years: 7.50  . Smokeless tobacco: Never Used  . Tobacco comment: 3 cigarettes per day (after meals)  Vaping Use  . Vaping Use: Never used  Substance Use Topics   . Alcohol use: No    Comment: occasionally   . Drug use: Yes    Types: Marijuana, Cocaine    Comment: cocaine, night before last    Home Medications Prior to Admission medications   Medication Sig Start Date End Date Taking? Authorizing Provider  Accu-Chek Softclix Lancets lancets Check sugar twice a day.  Dx Code: E11.9 02/04/20   Saguier, Percell Miller, PA-C  albuterol (VENTOLIN HFA) 108 (90 Base) MCG/ACT inhaler Inhale 2 puffs into the lungs every 4 (four) hours as needed for wheezing or shortness of breath. 03/03/20   Saguier, Percell Miller, PA-C  atorvastatin (LIPITOR) 10 MG tablet Take 1 tablet (10 mg total) by mouth daily. 09/21/18   Saguier, Percell Miller, PA-C  atorvastatin (LIPITOR) 10 MG tablet Take 1 tablet (10 mg total) by mouth daily. 02/15/20   Saguier, Percell Miller, PA-C  colchicine 0.6 MG tablet Take 1 tablet (0.6 mg total) by mouth 2 (two) times daily. 09/11/20   Kriston Mckinnie, [place] N, PA-C  fluticasone (FLONASE) 50 MCG/ACT nasal spray Place 1 spray into both nostrils daily. 10/28/18   Petrucelli, Samantha R, PA-C  gabapentin (NEURONTIN) 100 MG capsule Take 3 capsules (300 mg total) by mouth at bedtime. 05/05/18   Landis Martins, DPM  glipiZIDE (GLUCOTROL) 5 MG tablet Take 1 tablet (5 mg total) by mouth daily before breakfast. 01/25/20   Saguier, Percell Miller, PA-C  glucose blood (ACCU-CHEK GUIDE) test strip Check sugar twice a day.  Dx Code: E11.9 02/04/20   Saguier, Percell Miller, PA-C  glucose monitoring kit (FREESTYLE) monitoring kit 1 each by Does not apply route as needed for other. 12/24/19   Saguier, Percell Miller, PA-C  methocarbamol (ROBAXIN) 500 MG tablet Take 1 tablet (500 mg total) by mouth every 8 (eight) hours as needed. 10/06/18   Hudnall, Sharyn Lull, MD  metoprolol tartrate (LOPRESSOR) 100 MG tablet Take 1 tablet (100 mg total) by mouth once for 1 dose. TAKE 2 HOURS PRIOR TO CTA 02/24/20 02/24/20  Minus Breeding, MD  nicotine (NICODERM CQ - DOSED IN MG/24 HOURS) 21 mg/24hr patch Place 1 patch (21 mg total) onto the  skin daily. 02/24/20   Minus Breeding, MD  OLANZapine (ZYPREXA) 5 MG tablet Take 5 mg by mouth at bedtime.    [provider]  ondansetron (ZOFRAN ODT) 4 MG disintegrating tablet Take 1 tablet (4 mg total) by mouth every 8 (eight) hours as needed for nausea or vomiting. 10/28/18   Petrucelli, Samantha R, PA-C  OneTouch Delica Lancets 93Y MISC USE TO CHECK BLOOD SUGAR 3 TIMES DAILY. DX E11.9 02/04/20   Saguier, Percell Miller, PA-C  sertraline (ZOLOFT) 50 MG tablet Take 50 mg by mouth daily.    [provider]  SPIRIVA HANDIHALER 18 MCG inhalation capsule Place 1 capsule (18 mcg total) into inhaler and inhale daily. 03/03/20  Saguier, Percell Miller, PA-C  tadalafil (CIALIS) 10 MG tablet Take 1 tablet (10 mg total) by mouth daily as needed for erectile dysfunction. 09/24/18   Saguier, Percell Miller, PA-C  tiZANidine (ZANAFLEX) 4 MG tablet 1 tab po every 8 hours as needed muscle spasm/neck pain 12/04/18   Saguier, Percell Miller, PA-C  traZODone (DESYREL) 50 MG tablet Take 50 mg by mouth at bedtime.    [provider]  Vitamin D, Ergocalciferol, (DRISDOL) 50000 units CAPS capsule Take 1 capsule (50,000 Units total) by mouth every 7 (seven) days. 05/18/18   Copland, Gay Filler, MD    Allergies    Mushroom extract complex, Penicillins, Other, and Penicillins  Review of Systems   Review of Systems  Constitutional: Positive for chills.  HENT: Positive for rhinorrhea.   Respiratory: Positive for cough and shortness of breath.   Musculoskeletal: Positive for arthralgias.  Skin: Positive for wound.  All other systems reviewed and are negative.   Physical Exam Updated Vital Signs BP (!) 144/92 (BP Location: Right Arm)   Pulse 70   Temp 98.4 F (36.9 C) (Oral)   Resp 18   Ht '5\' 7"'  (1.702 m)   SpO2 98%   BMI 36.37 kg/m   Physical Exam Vitals and nursing note reviewed.  Constitutional:      General: He is not in acute distress.    Appearance: He is well-developed.  HENT:     Head: Normocephalic  and atraumatic.  Eyes:     Conjunctiva/sclera: Conjunctivae normal.  Cardiovascular:     Rate and Rhythm: Normal rate and regular rhythm.  Pulmonary:     Effort: Pulmonary effort is normal. No respiratory distress.     Breath sounds: Normal breath sounds.  Abdominal:     General: Bowel sounds are normal.     Palpations: Abdomen is soft.     Tenderness: There is no abdominal tenderness.  Musculoskeletal:     Comments: Right foot is generally edematous, warm, with generalized erythema, no erythema to the toes.  There is a chronic appearing linear wound to the right lateral aspect of the heel. No streaking.  No fluctuant abscess.  No drainage. Wound does not appear to have active infection.  Entire foot is tender, patient is unwilling to range the foot or ankle 2/t pain.  Intact DP pulses. Feet are dirty.   Skin:    General: Skin is warm.  Neurological:     Mental Status: He is alert.  Psychiatric:        Behavior: Behavior normal.     ED Results / Procedures / Treatments   Labs (all labs ordered are listed, but only abnormal results are displayed) Labs Reviewed  CBC - Abnormal; Notable for the following components:      Result Value   Platelets 408 (*)    All other components within normal limits  COMPREHENSIVE METABOLIC PANEL - Abnormal; Notable for the following components:   Glucose, Bld 127 (*)    Albumin 3.2 (*)    Total Bilirubin 0.2 (*)    All other components within normal limits  RESP PANEL BY RT-PCR (FLU A&B, COVID) [id number]    EKG EKG Interpretation  Date/Time:  Monday September 11 2020 10:44:04 EST Ventricular Rate:  63 PR Interval:  224 QRS Duration: 112 QT Interval:  424 QTC Calculation: 433 R Axis:   27 Text Interpretation: Sinus rhythm with 1st degree A-V block Minimal voltage criteria for LVH, may be normal variant ( Cornell product ) Possible Anterior infarct ,  age undetermined Abnormal ECG Confirmed by Noemi Chapel [phone]) on 09/11/2020 1:38:55  PM   Radiology DG Chest 2 View  Result Date: 09/11/2020 CLINICAL DATA:  Cough.  Short of breath and chest pain.  COPD. EXAM: CHEST - 2 VIEW COMPARISON:  02/24/2020 FINDINGS: Cardiac and mediastinal contours normal. Mild bibasilar atelectasis. Negative for heart failure or effusion. Bullous emphysema in the apices right greater than left. IMPRESSION: Mild bibasilar atelectasis. Electronically Signed   By: Franchot Gallo M.D.   On: 09/11/2020 09:14   DG Foot Complete Right  Result Date: 09/11/2020 CLINICAL DATA:  Diabetic foot wounds. EXAM: RIGHT FOOT COMPLETE - 3+ VIEW COMPARISON:  None. FINDINGS: The joint spaces are maintained. No acute bony findings or destructive bony changes. On the oblique film there appears to be a wound involving the lateral aspect of the heel. I do not see any obvious gas in the soft tissues. IMPRESSION: 1. No acute bony findings or destructive bony changes. 2. Probable wound involving the lateral aspect of the heel. Electronically Signed   By: Marijo Sanes M.D.   On: 09/11/2020 13:06    Procedures Procedures (including critical care time)  Medications Ordered in ED Medications  acetaminophen (TYLENOL) tablet 1,000 mg (1,000 mg Oral Given 09/11/20 1214)  dexamethasone (DECADRON) injection 6 mg (6 mg Intramuscular Given 09/11/20 1408)  colchicine tablet 1.2 mg (1.2 mg Oral Given 09/11/20 1407)    ED Course  I have reviewed the triage vital signs and the nursing notes.  Pertinent labs & imaging results that were available during my care of the patient were reviewed by me and considered in my medical decision making (see chart for details).    MDM Rules/Calculators/A&P                          Patient with complaint of flulike symptoms x1 week, as well as bilateral foot pain, worse on the right.  He is a rather poor historian, is not motivated to provide much history on examination.  He is homeless and is on his feet for long periods of time.  He reports chronic  wound to his foot.  No documented fevers.  On exam he appears drowsy though does not appear ill.  Heart and lung sounds are clear, afebrile.  Right foot with erythema, warmth, tenderness and pain with any range of motion.  Patient has chronic appearing wound to his right heel though this does not appear to be infected -with surrounding callus formation.  Plain film of the foot without obvious signs of osteomyelitis.  His laboratory work-up initiated in triage is reassuring, no leukocytosis or significant electrolyte derangements.  Covid and flu swab are also negative.  Chest x-ray is clear.  Patient discussed with and evaluated by Dr. Sabra Heck.  Plan to treat for suspected gouty arthritis with NSAIDs and a dose of Decadron here.  He is instructed to follow closely outpatient and return should symptoms worsen.  Patient discharged in no distress.  Discussed results, findings, treatment and follow up. Patient advised of return precautions. Patient verbalized understanding and agreed with plan.  Final Clinical Impression(s) / ED Diagnoses Final diagnoses:  Right foot pain  Viral URI with cough    Rx / DC Orders ED Discharge Orders         Ordered    colchicine 0.6 MG tablet  2 times daily        09/11/20 1401  Andi Layfield, [place] N, PA-C 09/11/20 1450    Noemi Chapel, MD 09/12/20 [phone]

## 2020-09-11 NOTE — ED Triage Notes (Signed)
Pt here from home with c/o sob and cough for the last 24 hrs

## 2020-09-11 NOTE — ED Provider Notes (Signed)
Patient is a 54 year old male presenting with right heel and foot pain.  States that the pain radiates from the ankle through the foot, worse with walking, mild swelling and redness, has been present for a couple of weeks, no fevers.  On exam the patient does have decreased range of motion of the ankle, there is mild swelling and warmth in the submalleoli are area, there is no redness to the skin or induration to suggest a cellulitis or infectious etiology, he does not have a fever, x-ray suggest no signs of deep tissue infection, likely an inflammatory arthritis, colchicine prednisone, discharge home.  Medical screening examination/treatment/procedure(s) were conducted as a shared visit with non-physician practitioner(s) and myself.  I personally evaluated the patient during the encounter.  Clinical Impression:   Final diagnoses:  Right foot pain  Viral URI with cough         Noemi Chapel, MD 09/12/20 [phone]

## 2020-11-13 ENCOUNTER — Other Ambulatory Visit: Payer: Self-pay

## 2020-11-13 ENCOUNTER — Emergency Department (HOSPITAL_COMMUNITY)
Admission: EM | Admit: 2020-11-13 | Discharge: 2020-11-14 | Disposition: A | Discharge status: Home / Self Care | Payer: Medicare Other | Attending: Emergency Medicine | Admitting: Emergency Medicine

## 2020-11-13 DIAGNOSIS — Z5900 Homelessness unspecified: Secondary | ICD-10-CM | POA: Insufficient documentation

## 2020-11-13 DIAGNOSIS — X58XXXA Exposure to other specified factors, initial encounter: Secondary | ICD-10-CM | POA: Diagnosis not present

## 2020-11-13 DIAGNOSIS — Z96651 Presence of right artificial knee joint: Secondary | ICD-10-CM | POA: Diagnosis not present

## 2020-11-13 DIAGNOSIS — F17200 Nicotine dependence, unspecified, uncomplicated: Secondary | ICD-10-CM | POA: Diagnosis not present

## 2020-11-13 DIAGNOSIS — I10 Essential (primary) hypertension: Secondary | ICD-10-CM | POA: Insufficient documentation

## 2020-11-13 DIAGNOSIS — Z79899 Other long term (current) drug therapy: Secondary | ICD-10-CM | POA: Insufficient documentation

## 2020-11-13 DIAGNOSIS — Z7951 Long term (current) use of inhaled steroids: Secondary | ICD-10-CM | POA: Diagnosis not present

## 2020-11-13 DIAGNOSIS — S91312A Laceration without foreign body, left foot, initial encounter: Secondary | ICD-10-CM | POA: Diagnosis not present

## 2020-11-13 DIAGNOSIS — J449 Chronic obstructive pulmonary disease, unspecified: Secondary | ICD-10-CM | POA: Insufficient documentation

## 2020-11-13 DIAGNOSIS — E119 Type 2 diabetes mellitus without complications: Secondary | ICD-10-CM | POA: Insufficient documentation

## 2020-11-13 DIAGNOSIS — Z7984 Long term (current) use of oral hypoglycemic drugs: Secondary | ICD-10-CM | POA: Diagnosis not present

## 2020-11-13 DIAGNOSIS — S91311A Laceration without foreign body, right foot, initial encounter: Secondary | ICD-10-CM | POA: Insufficient documentation

## 2020-11-13 DIAGNOSIS — S99921A Unspecified injury of right foot, initial encounter: Secondary | ICD-10-CM | POA: Diagnosis present

## 2020-11-13 DIAGNOSIS — S91319A Laceration without foreign body, unspecified foot, initial encounter: Secondary | ICD-10-CM

## 2020-11-13 NOTE — ED Triage Notes (Addendum)
Pt here c/o foot pain, chest wall pain, leg pain from walking. Pt currently homeless, requesting detox from heroin & crack. Hx diabetes. Dry, cracked skin noted to both heels, no bleeding/open wounds noted.

## 2020-11-14 NOTE — ED Provider Notes (Signed)
[facility] EMERGENCY DEPARTMENT Provider Note   CSN: [id number] Arrival date & time: 11/13/20  2031     History Chief Complaint  Patient presents with  . Foot Pain    Andrew [name] is a 55 y.o. male.  55 year old male with past medical history below who presents with foot pain.  Patient reports pain in bilateral feet from cracks in his heels.  He notes that he has been walking a lot because he is homeless.  He denies any bleeding or drainage from the cracks.  He also requests detox from heroin and crack.  The history is provided by the patient.  Foot Pain       Past Medical History:  Diagnosis Date  . Anxiety   . Arthritis    right knee  . Back pain    occasionally  . Bipolar disorder ([place])    denies  . COPD (chronic obstructive pulmonary disease) ([place])   . Depression   . Diabetes mellitus without complication ([place])   . Generalized headaches    history of migraines-last one 02/02/14  . GERD (gastroesophageal reflux disease)    takes Prilosec daily as needed  . Headache   . Hemorrhoids   . History of bronchitis    1 month ago and treated with Amoxicillin and given an inhaler;takes Prenisone  . Hyperlipidemia    takes Zetia daily  . Hypertension    takes HCTZ daily  . Joint pain   . Pneumonia    hx of 2 yrs ago  . Polysubstance abuse ([place])   . Schizoaffective disorder ([place])    denies  . Tuberculosis    treated for 1 year  . Umbilical hernia     Patient Active Problem List   Diagnosis Date Noted  . Type 2 diabetes mellitus with complication, without long-term current use of insulin ([place]) 05/25/2020  . Educated about COVID-19 virus infection 02/23/2020  . Crack cocaine overdose ([place]) 09/28/2017  . Hypoglycemia 09/28/2017  . Hepatitis 09/28/2017  . Leucocytosis 09/28/2017  . Overdose 09/28/2017  . Arthritis of knee, degenerative 01/24/2015  . Right knee pain 01/07/2014  . Concern about STD in male without diagnosis 01/06/2014  .  Dysuria 12/09/2013  . High cholesterol 10/25/2013  . GERD (gastroesophageal reflux disease) 09/20/2013  . History of positive PPD 07/17/2012  . Cocaine abuse ([place]) 04/02/2012  . Rhabdomyolysis 04/02/2012  . Hernia of abdominal wall 01/05/2011  . ERECTILE DYSFUNCTION, PSYCHOGENIC 02/13/2010  . BIPOLAR DISORDER UNSPECIFIED 01/31/2009  . TOBACCO ABUSE 01/31/2009  . ELEVATED BP READING WITHOUT DX HYPERTENSION 01/31/2009    Past Surgical History:  Procedure Laterality Date  . ESOPHAGOGASTRODUODENOSCOPY    . JOINT REPLACEMENT    . KNEE ARTHROSCOPY WITH MEDIAL MENISECTOMY Right 03/01/2014   Procedure: KNEE ARTHROSCOPY WITH MEDIAL MENISECTOMY;  Surgeon: Meredith Pel, MD;  Location: [facility];  Service: Orthopedics;  Laterality: Right;  . LIPOMA EXCISION  ~ 2010   "back left side of my head"  . PARTIAL KNEE ARTHROPLASTY Right 01/24/2015   Procedure: RIGHT KNEE MEDIAL COMPARTMENT REPLACEMENT VS TOTAL KNEE ARTHROPLASTY.;  Surgeon: Meredith Pel, MD;  Location: [facility];  Service: Orthopedics;  Laterality: Right;  RIGHT KNEE MEDIAL COMPARTMENT REPLACEMENT VS TOTAL KNEE ARTHROPLASTY.  . TOTAL KNEE ARTHROPLASTY Right        Family History  Problem Relation Age of Onset  . Hypertension Mother   . Cancer Mother   . Heart disease Father   . Heart attack Father 82  .  Hyperlipidemia Father   . Hypertension Father   . Diabetes Sister   . Diabetes Brother     Social History   Tobacco Use  . Smoking status: Current Every Day Smoker    Packs/day: 0.25    Years: 30.00    Pack years: 7.50  . Smokeless tobacco: Never Used  . Tobacco comment: 3 cigarettes per day (after meals)  Vaping Use  . Vaping Use: Never used  Substance Use Topics  . Alcohol use: No    Comment: occasionally   . Drug use: Yes    Types: Marijuana, Cocaine    Comment: cocaine, night before last    Home Medications Prior to Admission medications   Medication Sig Start Date End Date Taking? Authorizing Provider   Accu-Chek Softclix Lancets lancets Check sugar twice a day.  Dx Code: E11.9 02/04/20   Saguier, Percell Miller, PA-C  albuterol (VENTOLIN HFA) 108 (90 Base) MCG/ACT inhaler Inhale 2 puffs into the lungs every 4 (four) hours as needed for wheezing or shortness of breath. 03/03/20   Saguier, Percell Miller, PA-C  atorvastatin (LIPITOR) 10 MG tablet Take 1 tablet (10 mg total) by mouth daily. 09/21/18   Saguier, Percell Miller, PA-C  atorvastatin (LIPITOR) 10 MG tablet Take 1 tablet (10 mg total) by mouth daily. 02/15/20   Saguier, Percell Miller, PA-C  colchicine 0.6 MG tablet Take 1 tablet (0.6 mg total) by mouth 2 (two) times daily. 09/11/20   Robinson, [place] N, PA-C  fluticasone (FLONASE) 50 MCG/ACT nasal spray Place 1 spray into both nostrils daily. 10/28/18   Petrucelli, Samantha R, PA-C  gabapentin (NEURONTIN) 100 MG capsule Take 3 capsules (300 mg total) by mouth at bedtime. 05/05/18   Landis Martins, DPM  glipiZIDE (GLUCOTROL) 5 MG tablet Take 1 tablet (5 mg total) by mouth daily before breakfast. 01/25/20   Saguier, Percell Miller, PA-C  glucose blood (ACCU-CHEK GUIDE) test strip Check sugar twice a day.  Dx Code: E11.9 02/04/20   Saguier, Percell Miller, PA-C  glucose monitoring kit (FREESTYLE) monitoring kit 1 each by Does not apply route as needed for other. 12/24/19   Saguier, Percell Miller, PA-C  methocarbamol (ROBAXIN) 500 MG tablet Take 1 tablet (500 mg total) by mouth every 8 (eight) hours as needed. 10/06/18   Hudnall, Sharyn Lull, MD  metoprolol tartrate (LOPRESSOR) 100 MG tablet Take 1 tablet (100 mg total) by mouth once for 1 dose. TAKE 2 HOURS PRIOR TO CTA 02/24/20 02/24/20  Minus Breeding, MD  nicotine (NICODERM CQ - DOSED IN MG/24 HOURS) 21 mg/24hr patch Place 1 patch (21 mg total) onto the skin daily. 02/24/20   Minus Breeding, MD  OLANZapine (ZYPREXA) 5 MG tablet Take 5 mg by mouth at bedtime.    [provider]  ondansetron (ZOFRAN ODT) 4 MG disintegrating tablet Take 1 tablet (4 mg total) by mouth every 8 (eight) hours as needed  for nausea or vomiting. 10/28/18   Petrucelli, Samantha R, PA-C  OneTouch Delica Lancets 75Q MISC USE TO CHECK BLOOD SUGAR 3 TIMES DAILY. DX E11.9 02/04/20   Saguier, Percell Miller, PA-C  sertraline (ZOLOFT) 50 MG tablet Take 50 mg by mouth daily.    [provider]  SPIRIVA HANDIHALER 18 MCG inhalation capsule Place 1 capsule (18 mcg total) into inhaler and inhale daily. 03/03/20   Saguier, Percell Miller, PA-C  tadalafil (CIALIS) 10 MG tablet Take 1 tablet (10 mg total) by mouth daily as needed for erectile dysfunction. 09/24/18   Saguier, Percell Miller, PA-C  tiZANidine (ZANAFLEX) 4 MG tablet 1 tab po  every 8 hours as needed muscle spasm/neck pain 12/04/18   Saguier, Percell Miller, PA-C  traZODone (DESYREL) 50 MG tablet Take 50 mg by mouth at bedtime.    [provider]  Vitamin D, Ergocalciferol, (DRISDOL) 50000 units CAPS capsule Take 1 capsule (50,000 Units total) by mouth every 7 (seven) days. 05/18/18   Copland, Gay Filler, MD    Allergies    Mushroom extract complex, Penicillins, Other, and Penicillins  Review of Systems   Review of Systems All other systems reviewed and are negative except that which was mentioned in HPI  Physical Exam Updated Vital Signs BP (!) 139/100   Pulse (!) 58   Temp 98 F (36.7 C) (Oral)   Resp 14   SpO2 100%   Physical Exam Vitals and nursing note reviewed.  Constitutional:      General: He is not in acute distress.    Appearance: He is well-developed and well-nourished.     Comments: sleeping  HENT:     Head: Normocephalic and atraumatic.  Eyes:     Conjunctiva/sclera: Conjunctivae normal.  Cardiovascular:     Pulses: Normal pulses.  Musculoskeletal:     Cervical back: Neck supple.  Skin:    General: Skin is warm and dry.     Comments: Dry, flaking skin on plantar feet with cracks in b/l heels, no drainage or erythema; tender to touch  Neurological:     Mental Status: He is alert and oriented to person, place, and time.  Psychiatric:        Mood and  Affect: Mood and affect normal.     Comments: Flat affect     ED Results / Procedures / Treatments   Labs (all labs ordered are listed, but only abnormal results are displayed) Labs Reviewed - No data to display  EKG None  Radiology No results found.  Procedures Procedures   Medications Ordered in ED Medications - No data to display  ED Course  I have reviewed the triage vital signs and the nursing notes.      MDM Rules/Calculators/A&P                          Pt has cracks in heels likely 2/2 poor foot care, walking, and winter weather. Have placed bacitracin and petroleum on heels, bandaged them, and discussed foot care w/ emphasis on avoiding excess moisture. Pt voiced understanding. He has stable VS and is not demonstrating any signs of life-threatening withdrawal here.  Final Clinical Impression(s) / ED Diagnoses Final diagnoses:  Tear of skin of plantar aspect of foot, unspecified laterality, initial encounter    Rx / DC Orders ED Discharge Orders    None       Brenner Visconti, Wenda Overland, MD 11/14/20 [phone]

## 2020-11-14 NOTE — ED Notes (Signed)
Pt refusing vitals at this time.

## 2021-01-08 ENCOUNTER — Inpatient Hospital Stay (HOSPITAL_COMMUNITY): Payer: Medicare Other

## 2021-01-08 ENCOUNTER — Inpatient Hospital Stay (HOSPITAL_COMMUNITY)
Admission: EM | Admit: 2021-01-08 | Discharge: 2021-01-11 | DRG: 917 | Disposition: A | Discharge status: Home / Self Care | Payer: Medicare Other | Attending: Internal Medicine | Admitting: Internal Medicine

## 2021-01-08 ENCOUNTER — Other Ambulatory Visit: Payer: Self-pay

## 2021-01-08 ENCOUNTER — Emergency Department (HOSPITAL_COMMUNITY): Payer: Medicare Other

## 2021-01-08 ENCOUNTER — Encounter (HOSPITAL_COMMUNITY): Payer: Self-pay

## 2021-01-08 DIAGNOSIS — Z6836 Body mass index (BMI) 36.0-36.9, adult: Secondary | ICD-10-CM | POA: Diagnosis not present

## 2021-01-08 DIAGNOSIS — T50901A Poisoning by unspecified drugs, medicaments and biological substances, accidental (unintentional), initial encounter: Secondary | ICD-10-CM | POA: Diagnosis present

## 2021-01-08 DIAGNOSIS — T401X1A Poisoning by heroin, accidental (unintentional), initial encounter: Secondary | ICD-10-CM | POA: Diagnosis not present

## 2021-01-08 DIAGNOSIS — E785 Hyperlipidemia, unspecified: Secondary | ICD-10-CM | POA: Diagnosis present

## 2021-01-08 DIAGNOSIS — E875 Hyperkalemia: Secondary | ICD-10-CM

## 2021-01-08 DIAGNOSIS — Z79899 Other long term (current) drug therapy: Secondary | ICD-10-CM | POA: Diagnosis not present

## 2021-01-08 DIAGNOSIS — E669 Obesity, unspecified: Secondary | ICD-10-CM | POA: Diagnosis present

## 2021-01-08 DIAGNOSIS — Z8249 Family history of ischemic heart disease and other diseases of the circulatory system: Secondary | ICD-10-CM

## 2021-01-08 DIAGNOSIS — R413 Other amnesia: Secondary | ICD-10-CM | POA: Diagnosis present

## 2021-01-08 DIAGNOSIS — I10 Essential (primary) hypertension: Secondary | ICD-10-CM | POA: Diagnosis present

## 2021-01-08 DIAGNOSIS — K219 Gastro-esophageal reflux disease without esophagitis: Secondary | ICD-10-CM | POA: Diagnosis present

## 2021-01-08 DIAGNOSIS — T40604A Poisoning by unspecified narcotics, undetermined, initial encounter: Secondary | ICD-10-CM

## 2021-01-08 DIAGNOSIS — N179 Acute kidney failure, unspecified: Secondary | ICD-10-CM | POA: Diagnosis present

## 2021-01-08 DIAGNOSIS — F32A Depression, unspecified: Secondary | ICD-10-CM | POA: Diagnosis present

## 2021-01-08 DIAGNOSIS — F419 Anxiety disorder, unspecified: Secondary | ICD-10-CM | POA: Diagnosis present

## 2021-01-08 DIAGNOSIS — Z880 Allergy status to penicillin: Secondary | ICD-10-CM | POA: Diagnosis not present

## 2021-01-08 DIAGNOSIS — T40411A Poisoning by fentanyl or fentanyl analogs, accidental (unintentional), initial encounter: Secondary | ICD-10-CM | POA: Diagnosis present

## 2021-01-08 DIAGNOSIS — T887XXA Unspecified adverse effect of drug or medicament, initial encounter: Secondary | ICD-10-CM | POA: Diagnosis not present

## 2021-01-08 DIAGNOSIS — F259 Schizoaffective disorder, unspecified: Secondary | ICD-10-CM | POA: Diagnosis present

## 2021-01-08 DIAGNOSIS — Z20822 Contact with and (suspected) exposure to covid-19: Secondary | ICD-10-CM | POA: Diagnosis present

## 2021-01-08 DIAGNOSIS — Z7984 Long term (current) use of oral hypoglycemic drugs: Secondary | ICD-10-CM | POA: Diagnosis not present

## 2021-01-08 DIAGNOSIS — Z83438 Family history of other disorder of lipoprotein metabolism and other lipidemia: Secondary | ICD-10-CM

## 2021-01-08 DIAGNOSIS — Z96651 Presence of right artificial knee joint: Secondary | ICD-10-CM | POA: Diagnosis present

## 2021-01-08 DIAGNOSIS — F17210 Nicotine dependence, cigarettes, uncomplicated: Secondary | ICD-10-CM | POA: Diagnosis present

## 2021-01-08 DIAGNOSIS — M6282 Rhabdomyolysis: Secondary | ICD-10-CM | POA: Diagnosis present

## 2021-01-08 DIAGNOSIS — Z833 Family history of diabetes mellitus: Secondary | ICD-10-CM

## 2021-01-08 DIAGNOSIS — R7989 Other specified abnormal findings of blood chemistry: Secondary | ICD-10-CM

## 2021-01-08 DIAGNOSIS — J690 Pneumonitis due to inhalation of food and vomit: Secondary | ICD-10-CM

## 2021-01-08 DIAGNOSIS — G928 Other toxic encephalopathy: Secondary | ICD-10-CM | POA: Diagnosis present

## 2021-01-08 DIAGNOSIS — J449 Chronic obstructive pulmonary disease, unspecified: Secondary | ICD-10-CM | POA: Diagnosis present

## 2021-01-08 DIAGNOSIS — J9601 Acute respiratory failure with hypoxia: Secondary | ICD-10-CM

## 2021-01-08 DIAGNOSIS — E119 Type 2 diabetes mellitus without complications: Secondary | ICD-10-CM | POA: Diagnosis present

## 2021-01-08 DIAGNOSIS — Z9109 Other allergy status, other than to drugs and biological substances: Secondary | ICD-10-CM

## 2021-01-08 DIAGNOSIS — J439 Emphysema, unspecified: Secondary | ICD-10-CM | POA: Diagnosis not present

## 2021-01-08 LAB — CBC WITH DIFFERENTIAL/PLATELET
Abs Immature Granulocytes: 0.03 10*3/uL (ref 0.00–0.07)
Basophils Absolute: 0 10*3/uL (ref 0.0–0.1)
Basophils Relative: 0 %
Eosinophils Absolute: 0 10*3/uL (ref 0.0–0.5)
Eosinophils Relative: 0 %
HCT: 53.4 % — ABNORMAL HIGH (ref 39.0–52.0)
Hemoglobin: 16.9 g/dL (ref 13.0–17.0)
Immature Granulocytes: 0 %
Lymphocytes Relative: 3 %
Lymphs Abs: 0.3 10*3/uL — ABNORMAL LOW (ref 0.7–4.0)
MCH: 31.1 pg (ref 26.0–34.0)
MCHC: 31.6 g/dL (ref 30.0–36.0)
MCV: 98.2 fL (ref 80.0–100.0)
Monocytes Absolute: 0.4 10*3/uL (ref 0.1–1.0)
Monocytes Relative: 4 %
Neutro Abs: 10.8 10*3/uL — ABNORMAL HIGH (ref 1.7–7.7)
Neutrophils Relative %: 93 %
Platelets: 267 10*3/uL (ref 150–400)
RBC: 5.44 MIL/uL (ref 4.22–5.81)
RDW: 13.3 % (ref 11.5–15.5)
WBC: 11.5 10*3/uL — ABNORMAL HIGH (ref 4.0–10.5)
nRBC: 0 % (ref 0.0–0.2)

## 2021-01-08 LAB — HEMOGLOBIN A1C
Hgb A1c MFr Bld: 6.6 % — ABNORMAL HIGH (ref 4.8–5.6)
Mean Plasma Glucose: 142.72 mg/dL

## 2021-01-08 LAB — RESP PANEL BY RT-PCR (FLU A&B, COVID) ARPGX2
Influenza A by PCR: NEGATIVE
Influenza B by PCR: NEGATIVE
SARS Coronavirus 2 by RT PCR: NEGATIVE

## 2021-01-08 LAB — BASIC METABOLIC PANEL
Anion gap: 15 (ref 5–15)
BUN: 35 mg/dL — ABNORMAL HIGH (ref 6–20)
CO2: 23 mmol/L (ref 22–32)
Calcium: 8 mg/dL — ABNORMAL LOW (ref 8.9–10.3)
Chloride: 102 mmol/L (ref 98–111)
Creatinine, Ser: 2.79 mg/dL — ABNORMAL HIGH (ref 0.61–1.24)
GFR, Estimated: 26 mL/min — ABNORMAL LOW (ref >60)
Glucose, Bld: 117 mg/dL — ABNORMAL HIGH (ref 70–99)
Potassium: 6.9 mmol/L (ref 3.5–5.1)
Sodium: 140 mmol/L (ref 135–145)

## 2021-01-08 LAB — HEPATIC FUNCTION PANEL
ALT: 581 U/L — ABNORMAL HIGH (ref 0–44)
AST: 928 U/L — ABNORMAL HIGH (ref 15–41)
Albumin: 4.1 g/dL (ref 3.5–5.0)
Alkaline Phosphatase: 107 U/L (ref 38–126)
Bilirubin, Direct: 0.4 mg/dL — ABNORMAL HIGH (ref 0.0–0.2)
Indirect Bilirubin: 1 mg/dL — ABNORMAL HIGH (ref 0.3–0.9)
Total Bilirubin: 1.4 mg/dL — ABNORMAL HIGH (ref 0.3–1.2)
Total Protein: 8.2 g/dL — ABNORMAL HIGH (ref 6.5–8.1)

## 2021-01-08 LAB — HIV ANTIBODY (ROUTINE TESTING W REFLEX): HIV Screen 4th Generation wRfx: NONREACTIVE

## 2021-01-08 LAB — GLUCOSE, RANDOM: Glucose, Bld: 148 mg/dL — ABNORMAL HIGH (ref 70–99)

## 2021-01-08 LAB — POTASSIUM
Potassium: 4.5 mmol/L (ref 3.5–5.1)
Potassium: 4.9 mmol/L (ref 3.5–5.1)

## 2021-01-08 LAB — MRSA PCR SCREENING: MRSA by PCR: NEGATIVE

## 2021-01-08 LAB — GLUCOSE, CAPILLARY
Glucose-Capillary: 134 mg/dL — ABNORMAL HIGH (ref 70–99)
Glucose-Capillary: 169 mg/dL — ABNORMAL HIGH (ref 70–99)

## 2021-01-08 LAB — CBG MONITORING, ED
Glucose-Capillary: 165 mg/dL — ABNORMAL HIGH (ref 70–99)
Glucose-Capillary: >600 mg/dL (ref 70–99)

## 2021-01-08 LAB — CK: Total CK: 1127 U/L — ABNORMAL HIGH (ref 49–397)

## 2021-01-08 MED ORDER — ONDANSETRON HCL 4 MG/2ML IJ SOLN: 4.0000 mg | Freq: Four times a day (QID) | INTRAMUSCULAR | Status: DC | PRN

## 2021-01-08 MED ORDER — ONDANSETRON HCL 4 MG PO TABS: 4.0000 mg | ORAL_TABLET | Freq: Four times a day (QID) | ORAL | Status: DC | PRN

## 2021-01-08 MED ORDER — HYDROXYZINE HCL 25 MG PO TABS: 25.0000 mg | ORAL_TABLET | Freq: Four times a day (QID) | ORAL | Status: DC | PRN

## 2021-01-08 MED ORDER — NALOXONE HCL 0.4 MG/ML IJ SOLN: 0.4000 mg | Freq: Once | INTRAMUSCULAR | Status: DC

## 2021-01-08 MED ORDER — CALCIUM GLUCONATE-NACL 1-0.675 GM/50ML-% IV SOLN
1.0000 g | Freq: Once | INTRAVENOUS | Status: AC
  Administered 2021-01-08: 1000 mg via INTRAVENOUS
  Filled 2021-01-08: qty 50

## 2021-01-08 MED ORDER — METHOCARBAMOL 500 MG PO TABS: 500.0000 mg | ORAL_TABLET | Freq: Three times a day (TID) | ORAL | Status: DC | PRN

## 2021-01-08 MED ORDER — SODIUM CHLORIDE 0.9 % IV SOLN
2.0000 g | INTRAVENOUS | Status: DC
  Administered 2021-01-08 – 2021-01-10 (×3): 2 g via INTRAVENOUS
  Filled 2021-01-08 (×4): qty 20

## 2021-01-08 MED ORDER — DEXTROSE 10 % IV SOLN: Freq: Once | INTRAVENOUS | Status: AC

## 2021-01-08 MED ORDER — LOPERAMIDE HCL 2 MG PO CAPS: 2.0000 mg | ORAL_CAPSULE | ORAL | Status: DC | PRN

## 2021-01-08 MED ORDER — SODIUM CHLORIDE 0.45 % IV SOLN
INTRAVENOUS | Status: DC
  Filled 2021-01-08 (×12): qty 75

## 2021-01-08 MED ORDER — ORAL CARE MOUTH RINSE
15.0000 mL | Freq: Two times a day (BID) | OROMUCOSAL | Status: DC
  Administered 2021-01-08 – 2021-01-10 (×5): 15 mL via OROMUCOSAL

## 2021-01-08 MED ORDER — SODIUM POLYSTYRENE SULFONATE 15 GM/60ML PO SUSP: 15.0000 g | Freq: Once | ORAL | Status: DC

## 2021-01-08 MED ORDER — NALOXONE HCL 0.4 MG/ML IJ SOLN: 0.0400 mg | Freq: Once | INTRAMUSCULAR | Status: AC

## 2021-01-08 MED ORDER — DEXTROSE 50 % IV SOLN
1.0000 | Freq: Once | INTRAVENOUS | Status: AC
  Administered 2021-01-08: 50 mL via INTRAVENOUS
  Filled 2021-01-08: qty 50

## 2021-01-08 MED ORDER — CHLORHEXIDINE GLUCONATE CLOTH 2 % EX PADS
6.0000 | MEDICATED_PAD | Freq: Every day | CUTANEOUS | Status: DC
  Administered 2021-01-08 – 2021-01-09 (×2): 6 via TOPICAL

## 2021-01-08 MED ORDER — DICYCLOMINE HCL 20 MG PO TABS
20.0000 mg | ORAL_TABLET | Freq: Four times a day (QID) | ORAL | Status: DC | PRN
  Filled 2021-01-08: qty 1

## 2021-01-08 MED ORDER — CALCIUM GLUCONATE 10 % IV SOLN: 1.0000 g | Freq: Once | INTRAVENOUS | Status: DC

## 2021-01-08 MED ORDER — SODIUM ZIRCONIUM CYCLOSILICATE 10 G PO PACK
10.0000 g | PACK | Freq: Three times a day (TID) | ORAL | Status: DC
  Filled 2021-01-08: qty 1

## 2021-01-08 MED ORDER — METRONIDAZOLE IN NACL 5-0.79 MG/ML-% IV SOLN
500.0000 mg | Freq: Three times a day (TID) | INTRAVENOUS | Status: DC
  Administered 2021-01-08 – 2021-01-11 (×9): 500 mg via INTRAVENOUS
  Filled 2021-01-08 (×9): qty 100

## 2021-01-08 MED ORDER — BOOST / RESOURCE BREEZE PO LIQD CUSTOM
1.0000 | Freq: Three times a day (TID) | ORAL | Status: DC
  Administered 2021-01-08 – 2021-01-10 (×4): 1 via ORAL

## 2021-01-08 MED ORDER — NALOXONE HCL 0.4 MG/ML IJ SOLN
INTRAMUSCULAR | Status: AC
  Administered 2021-01-08: 0.04 mg via INTRAVENOUS
  Filled 2021-01-08: qty 1

## 2021-01-08 MED ORDER — ENOXAPARIN SODIUM 40 MG/0.4ML ~~LOC~~ SOLN
40.0000 mg | SUBCUTANEOUS | Status: DC
  Filled 2021-01-08: qty 0.4

## 2021-01-08 MED ORDER — SODIUM CHLORIDE 0.9 % IV SOLN: Freq: Once | INTRAVENOUS | Status: AC

## 2021-01-08 MED ORDER — INSULIN ASPART 100 UNIT/ML IV SOLN
5.0000 [IU] | Freq: Once | INTRAVENOUS | Status: AC
  Administered 2021-01-08: 5 [IU] via INTRAVENOUS
  Filled 2021-01-08: qty 0.05

## 2021-01-08 MED ORDER — NAPROXEN 500 MG PO TABS
500.0000 mg | ORAL_TABLET | Freq: Two times a day (BID) | ORAL | Status: DC | PRN
  Filled 2021-01-08: qty 2

## 2021-01-08 MED ORDER — NALOXONE HCL 0.4 MG/ML IJ SOLN: 0.4000 mg | INTRAMUSCULAR | Status: DC | PRN

## 2021-01-08 MED ORDER — ALBUTEROL SULFATE (2.5 MG/3ML) 0.083% IN NEBU
10.0000 mg | INHALATION_SOLUTION | Freq: Once | RESPIRATORY_TRACT | Status: AC
  Administered 2021-01-08: 10 mg via RESPIRATORY_TRACT
  Filled 2021-01-08: qty 12

## 2021-01-08 NOTE — ED Notes (Signed)
Bed bug detected in scan Placed blankets under the door to patients room

## 2021-01-08 NOTE — ED Provider Notes (Addendum)
[facility] DEPT Provider Note   CSN: [id number] Arrival date & time: 01/08/21  1227     History Chief Complaint  Patient presents with  . Drug Overdose    Andrew [name] is a 55 y.o. male.  LEVEL 5 CAVEAT DUE TO ALTERED MENTAL STATUS. Pt per EMS overdosed on likely fentanyl. Police also state likely the opioid as person he was with states that. No trauma or falls. Patient found by fire somnolent with slow breathing. No narcan given as he did not have an apnea.   The history is provided by the patient.  Drug Overdose This is a new problem. The current episode started less than 1 hour ago. The problem occurs constantly. The problem has not changed since onset.Nothing aggravates the symptoms. Nothing relieves the symptoms. He has tried nothing for the symptoms. The treatment provided no relief.       Past Medical History:  Diagnosis Date  . Anxiety   . Arthritis    right knee  . Back pain    occasionally  . Bipolar disorder ([place])    denies  . COPD (chronic obstructive pulmonary disease) ([place])   . Depression   . Diabetes mellitus without complication ([place])   . Generalized headaches    history of migraines-last one 02/02/14  . GERD (gastroesophageal reflux disease)    takes Prilosec daily as needed  . Headache   . Hemorrhoids   . History of bronchitis    1 month ago and treated with Amoxicillin and given an inhaler;takes Prenisone  . Hyperlipidemia    takes Zetia daily  . Hypertension    takes HCTZ daily  . Joint pain   . Pneumonia    hx of 2 yrs ago  . Polysubstance abuse ([place])   . Schizoaffective disorder ([place])    denies  . Tuberculosis    treated for 1 year  . Umbilical hernia     Patient Active Problem List   Diagnosis Date Noted  . Type 2 diabetes mellitus with complication, without long-term current use of insulin ([place]) 05/25/2020  . Educated about COVID-19 virus infection 02/23/2020  . Crack cocaine overdose ([place])  09/28/2017  . Hypoglycemia 09/28/2017  . Hepatitis 09/28/2017  . Leucocytosis 09/28/2017  . Overdose 09/28/2017  . Arthritis of knee, degenerative 01/24/2015  . Right knee pain 01/07/2014  . Concern about STD in male without diagnosis 01/06/2014  . Dysuria 12/09/2013  . High cholesterol 10/25/2013  . GERD (gastroesophageal reflux disease) 09/20/2013  . History of positive PPD 07/17/2012  . Cocaine abuse ([place]) 04/02/2012  . Rhabdomyolysis 04/02/2012  . Hernia of abdominal wall 01/05/2011  . ERECTILE DYSFUNCTION, PSYCHOGENIC 02/13/2010  . BIPOLAR DISORDER UNSPECIFIED 01/31/2009  . TOBACCO ABUSE 01/31/2009  . ELEVATED BP READING WITHOUT DX HYPERTENSION 01/31/2009    Past Surgical History:  Procedure Laterality Date  . ESOPHAGOGASTRODUODENOSCOPY    . JOINT REPLACEMENT    . KNEE ARTHROSCOPY WITH MEDIAL MENISECTOMY Right 03/01/2014   Procedure: KNEE ARTHROSCOPY WITH MEDIAL MENISECTOMY;  Surgeon: Meredith Pel, MD;  Location: [facility];  Service: Orthopedics;  Laterality: Right;  . LIPOMA EXCISION  ~ 2010   "back left side of my head"  . PARTIAL KNEE ARTHROPLASTY Right 01/24/2015   Procedure: RIGHT KNEE MEDIAL COMPARTMENT REPLACEMENT VS TOTAL KNEE ARTHROPLASTY.;  Surgeon: Meredith Pel, MD;  Location: [facility];  Service: Orthopedics;  Laterality: Right;  RIGHT KNEE MEDIAL COMPARTMENT REPLACEMENT VS TOTAL KNEE ARTHROPLASTY.  . TOTAL KNEE ARTHROPLASTY Right  Family History  Problem Relation Age of Onset  . Hypertension Mother   . Cancer Mother   . Heart disease Father   . Heart attack Father 24  . Hyperlipidemia Father   . Hypertension Father   . Diabetes Sister   . Diabetes Brother     Social History   Tobacco Use  . Smoking status: Current Every Day Smoker    Packs/day: 0.25    Years: 30.00    Pack years: 7.50  . Smokeless tobacco: Never Used  . Tobacco comment: 3 cigarettes per day (after meals)  Vaping Use  . Vaping Use: Never used  Substance Use Topics   . Alcohol use: No    Comment: occasionally   . Drug use: Yes    Types: Marijuana, Cocaine    Comment: cocaine, night before last    Home Medications Prior to Admission medications   Medication Sig Start Date End Date Taking? Authorizing Provider  Accu-Chek Softclix Lancets lancets Check sugar twice a day.  Dx Code: E11.9 02/04/20   Saguier, Percell Miller, PA-C  albuterol (VENTOLIN HFA) 108 (90 Base) MCG/ACT inhaler Inhale 2 puffs into the lungs every 4 (four) hours as needed for wheezing or shortness of breath. 03/03/20   Saguier, Percell Miller, PA-C  atorvastatin (LIPITOR) 10 MG tablet Take 1 tablet (10 mg total) by mouth daily. 09/21/18   Saguier, Percell Miller, PA-C  atorvastatin (LIPITOR) 10 MG tablet Take 1 tablet (10 mg total) by mouth daily. 02/15/20   Saguier, Percell Miller, PA-C  colchicine 0.6 MG tablet Take 1 tablet (0.6 mg total) by mouth 2 (two) times daily. 09/11/20   Robinson, [place] N, PA-C  fluticasone (FLONASE) 50 MCG/ACT nasal spray Place 1 spray into both nostrils daily. 10/28/18   Petrucelli, Samantha R, PA-C  gabapentin (NEURONTIN) 100 MG capsule Take 3 capsules (300 mg total) by mouth at bedtime. 05/05/18   Landis Martins, DPM  glipiZIDE (GLUCOTROL) 5 MG tablet Take 1 tablet (5 mg total) by mouth daily before breakfast. 01/25/20   Saguier, Percell Miller, PA-C  glucose blood (ACCU-CHEK GUIDE) test strip Check sugar twice a day.  Dx Code: E11.9 02/04/20   Saguier, Percell Miller, PA-C  glucose monitoring kit (FREESTYLE) monitoring kit 1 each by Does not apply route as needed for other. 12/24/19   Saguier, Percell Miller, PA-C  methocarbamol (ROBAXIN) 500 MG tablet Take 1 tablet (500 mg total) by mouth every 8 (eight) hours as needed. 10/06/18   Hudnall, Sharyn Lull, MD  metoprolol tartrate (LOPRESSOR) 100 MG tablet Take 1 tablet (100 mg total) by mouth once for 1 dose. TAKE 2 HOURS PRIOR TO CTA 02/24/20 02/24/20  Minus Breeding, MD  nicotine (NICODERM CQ - DOSED IN MG/24 HOURS) 21 mg/24hr patch Place 1 patch (21 mg total) onto the  skin daily. 02/24/20   Minus Breeding, MD  OLANZapine (ZYPREXA) 5 MG tablet Take 5 mg by mouth at bedtime.    [provider]  ondansetron (ZOFRAN ODT) 4 MG disintegrating tablet Take 1 tablet (4 mg total) by mouth every 8 (eight) hours as needed for nausea or vomiting. 10/28/18   Petrucelli, Samantha R, PA-C  OneTouch Delica Lancets 83E MISC USE TO CHECK BLOOD SUGAR 3 TIMES DAILY. DX E11.9 02/04/20   Saguier, Percell Miller, PA-C  sertraline (ZOLOFT) 50 MG tablet Take 50 mg by mouth daily.    [provider]  SPIRIVA HANDIHALER 18 MCG inhalation capsule Place 1 capsule (18 mcg total) into inhaler and inhale daily. 03/03/20   Saguier, Percell Miller, PA-C  tadalafil (CIALIS)  10 MG tablet Take 1 tablet (10 mg total) by mouth daily as needed for erectile dysfunction. 09/24/18   Saguier, Percell Miller, PA-C  tiZANidine (ZANAFLEX) 4 MG tablet 1 tab po every 8 hours as needed muscle spasm/neck pain 12/04/18   Saguier, Percell Miller, PA-C  traZODone (DESYREL) 50 MG tablet Take 50 mg by mouth at bedtime.    [provider]  Vitamin D, Ergocalciferol, (DRISDOL) 50000 units CAPS capsule Take 1 capsule (50,000 Units total) by mouth every 7 (seven) days. 05/18/18   Copland, Gay Filler, MD    Allergies    Mushroom extract complex, Penicillins, Other, and Penicillins  Review of Systems   Review of Systems  Unable to perform ROS: Mental status change    Physical Exam Updated Vital Signs BP 103/62   Pulse 88   Temp (!) 97.5 F (36.4 C) (Oral)   Resp (!) 8   Ht '5\' 7"'  (1.702 m)   Wt 105 kg   SpO2 94%   BMI 36.26 kg/m   Physical Exam Constitutional:      General: He is not in acute distress. HENT:     Head: Normocephalic and atraumatic.     Nose: Nose normal.     Mouth/Throat:     Mouth: Mucous membranes are dry.  Eyes:     Extraocular Movements: Extraocular movements intact.     Pupils: Pupils are equal, round, and reactive to light.  Cardiovascular:     Rate and Rhythm: Normal rate.     Pulses:  Normal pulses.  Pulmonary:     Effort: Pulmonary effort is normal.     Comments: Coarse breath sounds, no respiratory distress  Musculoskeletal:        General: No swelling or deformity.  Skin:    General: Skin is warm.  Neurological:     General: No focal deficit present.     Comments: After small dose of narcan patient woke up more and moved all extremities, fell back asleep again shortly afterwards. Somnolent but arousable     ED Results / Procedures / Treatments   Labs (all labs ordered are listed, but only abnormal results are displayed) Labs Reviewed  CBC WITH DIFFERENTIAL/PLATELET - Abnormal; Notable for the following components:      Result Value   WBC 11.5 (*)    HCT 53.4 (*)    Neutro Abs 10.8 (*)    Lymphs Abs 0.3 (*)    All other components within normal limits  BASIC METABOLIC PANEL - Abnormal; Notable for the following components:   Potassium 6.9 (*)    Glucose, Bld 117 (*)    BUN 35 (*)    Creatinine, Ser 2.79 (*)    Calcium 8.0 (*)    GFR, Estimated 26 (*)    All other components within normal limits  RESP PANEL BY RT-PCR (FLU A&B, COVID) ARPGX2  GLUCOSE, RANDOM    EKG None  Radiology DG Chest Portable 1 View  Result Date: 01/08/2021 CLINICAL DATA:  Overdose. EXAM: PORTABLE CHEST 1 VIEW COMPARISON:  September 11, 2020. FINDINGS: The heart size and mediastinal contours are within normal limits. No pneumothorax is noted. Emphysematous disease is noted in the right upper lobe. Mild bibasilar atelectasis or infiltrates are noted. The visualized skeletal structures are unremarkable. IMPRESSION: Mild bibasilar subsegmental atelectasis or infiltrates. Aortic Atherosclerosis (ICD10-I70.0). Electronically Signed   By: Marijo Conception M.D.   On: 01/08/2021 13:40    Procedures .Critical Care Performed by: Lennice Sites, DO Authorized by: Ronnald Nian,  Kacelyn Rowzee, DO   Critical care provider statement:    Critical care time (minutes):  27   Critical care was necessary to  treat or prevent imminent or life-threatening deterioration of the following conditions:  Respiratory failure (hyperkalemia)   Critical care was time spent personally by me on the following activities:  Blood draw for specimens, development of treatment plan with patient or surrogate, discussions with primary provider, evaluation of patient's response to treatment, examination of patient, obtaining history from patient or surrogate, ordering and performing treatments and interventions, ordering and review of laboratory studies, ordering and review of radiographic studies, pulse oximetry, re-evaluation of patient's condition and review of old charts   I assumed direction of critical care for this patient from another provider in my specialty: no       Medications Ordered in ED Medications  albuterol (PROVENTIL) (2.5 MG/3ML) 0.083% nebulizer solution 10 mg (has no administration in time range)  dextrose 10 % infusion (has no administration in time range)  0.9 %  sodium chloride infusion (has no administration in time range)  calcium gluconate 1 g/ 50 mL sodium chloride IVPB (1,000 mg Intravenous New Bag/Given 01/08/21 1431)  naloxone (NARCAN) injection 0.04 mg (0.04 mg Intravenous Given 01/08/21 1305)  insulin aspart (novoLOG) injection 5 Units (5 Units Intravenous Given 01/08/21 1431)    And  dextrose 50 % solution 50 mL (50 mLs Intravenous Given 01/08/21 1431)    ED Course  I have reviewed the triage vital signs and the nursing notes.  Pertinent labs & imaging results that were available during my care of the patient were reviewed by me and considered in my medical decision making (see chart for details).    MDM Rules/Calculators/A&P                          SAI ZINN is a 55 year old male with history of polysubstance abuse, schizoaffective disorder who presents the ED after opioid overdose.  Vital signs are normal.  Patient is somnolent but easily arousable.  Was given a small dose of Narcan  with transient improvement of his respiratory symptoms.  EMS and police provide history.  They were called out for opioid overdose.  Likely that he took fentanyl.  There was no trauma or falls per bystanders.  He does not have any signs of external trauma on exam.  No hematoma seen on his head exam.  When patient was given Narcan he moved all of his extremities okay.  Per police he did have some vomiting episodes last night and this morning.  We will get basic labs and chest x-ray.  Possible that he could have aspirated but did not have any vomitus after overdose.  Did not get any Narcan in the field.  End-tidal CO2 is normal.  Will allow him to metabolize and reevaluate.  Chest x-ray with atelectasis versus infiltrates.  Suspect atelectasis given overdose.  However could be aspiration as there was a history that he was having some emesis prior to tonight.  Will cover with antibiotics.  Potassium is 6.9.  Creatinine 2.79.  Will get EKG and treat for hyperkalemia.  Will need admission at this point.  Still somnolent but arousable.  Mental status appears to be improving.  Patient given IV calcium, albuterol, dextrose, insulin for hyperkalemia.  Will start on maintenance fluids for AKI.  Patient did have some hyperkalemic changes on EKG with some peaked T waves.  To be admitted to medicine for  further care.  This chart was dictated using voice recognition software.  Despite best efforts to proofread,  errors can occur which can change the documentation meaning.    Final Clinical Impression(s) / ED Diagnoses Final diagnoses:  Opiate overdose, undetermined intent, initial encounter ([place])  Hyperkalemia  AKI (acute kidney injury) ([place])  Acute respiratory failure with hypoxia ([place])  Aspiration pneumonia, unspecified aspiration pneumonia type, unspecified laterality, unspecified part of lung [facility])    Rx / DC Orders ED Discharge Orders    None       Lennice Sites, DO 01/08/21 [place],  Francis, DO 01/08/21 1432

## 2021-01-08 NOTE — H&P (Signed)
History and Physical    BARKLEY KRATOCHVIL UYQ:[id number] DOB: 01-04-1966 DOA: 01/08/2021  PCP: Pcp, No  Patient coming from: Home  Chief Complaint: AMS, drug overdose  HPI: Andrew [name] is a 55 y.o. male with medical history significant of substance abuse, DM2. Presenting AMS. He is intermittently awake with deep sternal rub but not participating in interview. History is from chart review. Per ED staff, patient was found altered by EMS. He apparently overdosed on fentanyl. He was brought to the ED for assistance. No other aggravating or alleviating symptoms.  ED Course: He was lethargic w/ slow respiratory rate. Given small dose narcan. Improved. Labs showed rhabdo, elevated LFTs, and AKI. He was started on rocephin and zithro for possible PNA on CXR. He was found to have elevated K+.TRH was called for admission.    Review of Systems:  Unable to obtain d/t mentation.   PMHx Past Medical History:  Diagnosis Date  . Anxiety   . Arthritis    right knee  . Back pain    occasionally  . Bipolar disorder ([place])    denies  . COPD (chronic obstructive pulmonary disease) ([place])   . Depression   . Diabetes mellitus without complication ([place])   . Generalized headaches    history of migraines-last one 02/02/14  . GERD (gastroesophageal reflux disease)    takes Prilosec daily as needed  . Headache   . Hemorrhoids   . History of bronchitis    1 month ago and treated with Amoxicillin and given an inhaler;takes Prenisone  . Hyperlipidemia    takes Zetia daily  . Hypertension    takes HCTZ daily  . Joint pain   . Pneumonia    hx of 2 yrs ago  . Polysubstance abuse ([place])   . Schizoaffective disorder ([place])    denies  . Tuberculosis    treated for 1 year  . Umbilical hernia     PSHx Past Surgical History:  Procedure Laterality Date  . ESOPHAGOGASTRODUODENOSCOPY    . JOINT REPLACEMENT    . KNEE ARTHROSCOPY WITH MEDIAL MENISECTOMY Right 03/01/2014   Procedure: KNEE ARTHROSCOPY WITH MEDIAL  MENISECTOMY;  Surgeon: Meredith Pel, MD;  Location: [facility];  Service: Orthopedics;  Laterality: Right;  . LIPOMA EXCISION  ~ 2010   "back left side of my head"  . PARTIAL KNEE ARTHROPLASTY Right 01/24/2015   Procedure: RIGHT KNEE MEDIAL COMPARTMENT REPLACEMENT VS TOTAL KNEE ARTHROPLASTY.;  Surgeon: Meredith Pel, MD;  Location: [facility];  Service: Orthopedics;  Laterality: Right;  RIGHT KNEE MEDIAL COMPARTMENT REPLACEMENT VS TOTAL KNEE ARTHROPLASTY.  . TOTAL KNEE ARTHROPLASTY Right     SocHx  reports that he has been smoking. He has a 7.50 pack-year smoking history. He has never used smokeless tobacco. He reports current drug use. Drugs: Marijuana and Cocaine. He reports that he does not drink alcohol.  Allergies  Allergen Reactions  . Mushroom Extract Complex Hives  . Penicillins Hives  . Other Other (See Comments)    Mushroom: Hives  . Penicillins Hives    Has patient had a PCN reaction causing immediate rash, facial/tongue/throat swelling, SOB or lightheadedness with hypotension: no Has patient had a PCN reaction causing severe rash involving mucus membranes or skin necrosis: no Has patient had a PCN reaction that required hospitalization: no Has patient had a PCN reaction occurring within the last 10 years: yes If all of the above answers are "NO", then may proceed with Cephalosporin use.    FamHx  Family History  Problem Relation Age of Onset  . Hypertension Mother   . Cancer Mother   . Heart disease Father   . Heart attack Father 53  . Hyperlipidemia Father   . Hypertension Father   . Diabetes Sister   . Diabetes Brother     Prior to Admission medications   Medication Sig Start Date End Date Taking? Authorizing Provider  Accu-Chek Softclix Lancets lancets Check sugar twice a day.  Dx Code: E11.9 02/04/20   Saguier, Percell Miller, PA-C  albuterol (VENTOLIN HFA) 108 (90 Base) MCG/ACT inhaler Inhale 2 puffs into the lungs every 4 (four) hours as needed for wheezing or  shortness of breath. 03/03/20   Saguier, Percell Miller, PA-C  atorvastatin (LIPITOR) 10 MG tablet Take 1 tablet (10 mg total) by mouth daily. 09/21/18   Saguier, Percell Miller, PA-C  atorvastatin (LIPITOR) 10 MG tablet Take 1 tablet (10 mg total) by mouth daily. 02/15/20   Saguier, Percell Miller, PA-C  colchicine 0.6 MG tablet Take 1 tablet (0.6 mg total) by mouth 2 (two) times daily. 09/11/20   Robinson, [place] N, PA-C  fluticasone (FLONASE) 50 MCG/ACT nasal spray Place 1 spray into both nostrils daily. 10/28/18   Petrucelli, Samantha R, PA-C  gabapentin (NEURONTIN) 100 MG capsule Take 3 capsules (300 mg total) by mouth at bedtime. 05/05/18   Landis Martins, DPM  glipiZIDE (GLUCOTROL) 5 MG tablet Take 1 tablet (5 mg total) by mouth daily before breakfast. 01/25/20   Saguier, Percell Miller, PA-C  glucose blood (ACCU-CHEK GUIDE) test strip Check sugar twice a day.  Dx Code: E11.9 02/04/20   Saguier, Percell Miller, PA-C  glucose monitoring kit (FREESTYLE) monitoring kit 1 each by Does not apply route as needed for other. 12/24/19   Saguier, Percell Miller, PA-C  methocarbamol (ROBAXIN) 500 MG tablet Take 1 tablet (500 mg total) by mouth every 8 (eight) hours as needed. 10/06/18   Hudnall, Sharyn Lull, MD  metoprolol tartrate (LOPRESSOR) 100 MG tablet Take 1 tablet (100 mg total) by mouth once for 1 dose. TAKE 2 HOURS PRIOR TO CTA 02/24/20 02/24/20  Minus Breeding, MD  nicotine (NICODERM CQ - DOSED IN MG/24 HOURS) 21 mg/24hr patch Place 1 patch (21 mg total) onto the skin daily. 02/24/20   Minus Breeding, MD  OLANZapine (ZYPREXA) 5 MG tablet Take 5 mg by mouth at bedtime.    [provider]  ondansetron (ZOFRAN ODT) 4 MG disintegrating tablet Take 1 tablet (4 mg total) by mouth every 8 (eight) hours as needed for nausea or vomiting. 10/28/18   Petrucelli, Samantha R, PA-C  OneTouch Delica Lancets 44R MISC USE TO CHECK BLOOD SUGAR 3 TIMES DAILY. DX E11.9 02/04/20   Saguier, Percell Miller, PA-C  sertraline (ZOLOFT) 50 MG tablet Take 50 mg by mouth daily.     [provider]  SPIRIVA HANDIHALER 18 MCG inhalation capsule Place 1 capsule (18 mcg total) into inhaler and inhale daily. 03/03/20   Saguier, Percell Miller, PA-C  tadalafil (CIALIS) 10 MG tablet Take 1 tablet (10 mg total) by mouth daily as needed for erectile dysfunction. 09/24/18   Saguier, Percell Miller, PA-C  tiZANidine (ZANAFLEX) 4 MG tablet 1 tab po every 8 hours as needed muscle spasm/neck pain 12/04/18   Saguier, Percell Miller, PA-C  traZODone (DESYREL) 50 MG tablet Take 50 mg by mouth at bedtime.    [provider]  Vitamin D, Ergocalciferol, (DRISDOL) 50000 units CAPS capsule Take 1 capsule (50,000 Units total) by mouth every 7 (seven) days. 05/18/18   Copland, Gay Filler, MD  Physical Exam: Vitals:   01/08/21 1345 01/08/21 1400 01/08/21 1436 01/08/21 1500  BP:  103/62  113/78  Pulse: 90 88 85 87  Resp: (!) 8 (!) _0 Temp:      TempSrc:      SpO2: 94% 94% 93% 96%  Weight:      Height:        General: 55 y.o. male resting in bed in NAD Eyes: PERRL, normal sclera ENMT: Nares patent w/o discharge, orophaynx clear, dentition normal, ears w/o discharge/lesions/ulcers Neck: Supple, trachea midline Cardiovascular: RRR, +S1, S2, no m/g/r, equal pulses throughout Respiratory: sonorus, slow respiratory rate , normal WOB GI: BS+, NDNT, no masses noted, no organomegaly noted MSK: No e/c/c Skin: No rashes, bruises, ulcerations noted Neuro: somnolent, temporarily awakes to deep sternal rub  Labs on Admission: I have personally reviewed following labs and imaging studies  CBC: Recent Labs  Lab 01/08/21 1254  WBC 11.5*  NEUTROABS 10.8*  HGB 16.9  HCT 53.4*  MCV 98.2  PLT 144   Basic Metabolic Panel: Recent Labs  Lab 01/08/21 1254  NA 140  K 6.9*  CL 102  CO2 23  GLUCOSE 117*  BUN 35*  CREATININE 2.79*  CALCIUM 8.0*   GFR: Estimated Creatinine Clearance: 34.6 mL/min (A) (by C-G formula based on SCr of 2.79 mg/dL (H)). Liver Function Tests: No results for  input(s): AST, ALT, ALKPHOS, BILITOT, PROT, ALBUMIN in the last 168 hours. No results for input(s): LIPASE, AMYLASE in the last 168 hours. No results for input(s): AMMONIA in the last 168 hours. Coagulation Profile: No results for input(s): INR, PROTIME in the last 168 hours. Cardiac Enzymes: No results for input(s): CKTOTAL, CKMB, CKMBINDEX, TROPONINI in the last 168 hours. BNP (last 3 results) No results for input(s): PROBNP in the last 8760 hours. HbA1C: No results for input(s): HGBA1C in the last 72 hours. CBG: No results for input(s): GLUCAP in the last 168 hours. Lipid Profile: No results for input(s): CHOL, HDL, LDLCALC, TRIG, CHOLHDL, LDLDIRECT in the last 72 hours. Thyroid Function Tests: No results for input(s): TSH, T4TOTAL, FREET4, T3FREE, THYROIDAB in the last 72 hours. Anemia Panel: No results for input(s): VITAMINB12, FOLATE, FERRITIN, TIBC, IRON, RETICCTPCT in the last 72 hours. Urine analysis:    Component Value Date/Time   COLORURINE YELLOW 03/06/2018 1837   APPEARANCEUR CLEAR 03/06/2018 1837   LABSPEC 1.015 03/06/2018 1837   PHURINE 6.0 03/06/2018 1837   GLUCOSEU >=500 (A) 03/06/2018 1837   HGBUR NEGATIVE 03/06/2018 1837   BILIRUBINUR NEGATIVE 03/06/2018 1837   BILIRUBINUR NEG 12/09/2013 [place] 03/06/2018 1837   PROTEINUR NEGATIVE 03/06/2018 1837   UROBILINOGEN 0.2 01/17/2015 1415   NITRITE NEGATIVE 03/06/2018 1837   LEUKOCYTESUR NEGATIVE 03/06/2018 1837    Radiological Exams on Admission: DG Chest Portable 1 View  Result Date: 01/08/2021 CLINICAL DATA:  Overdose. EXAM: PORTABLE CHEST 1 VIEW COMPARISON:  September 11, 2020. FINDINGS: The heart size and mediastinal contours are within normal limits. No pneumothorax is noted. Emphysematous disease is noted in the right upper lobe. Mild bibasilar atelectasis or infiltrates are noted. The visualized skeletal structures are unremarkable. IMPRESSION: Mild bibasilar subsegmental atelectasis or  infiltrates. Aortic Atherosclerosis (ICD10-I70.0). Electronically Signed   By: Marijo Conception M.D.   On: 01/08/2021 13:40    EKG: Independently reviewed. Sinus, no st elevations; peaked Ts noted   Assessment/Plan Acute toxic encephalopathy Overdose on fentanyl/heroin     - admit to inpt, SDU     -  check rapid drug screen     - narcan PRN     - fluids  Hyperkalemia     - K+ 6.9, EKG w/ peaked T's     - he is starting to wake up; will give lokelma; if he is not tolerating PO, use Kayexelate enema; sidebarred nephrology on this  Elevated LFTs      - check hepatitis panel      - check [place] ab complete for this and AKI  Rhabdomyolysis      - fluids 1/2 NS w/ bicarb, follow  AKI     - fluids, follow  DM2     - glucose check, A1c     - he is not on long term insulin; will hold on SSI for now     - CLD when he wakes up  Possible bibasliar PNA     - Aspiration?     - continue rocephin/azithro for now     - O2 support as needed  DVT prophylaxis: lovenox  Code Status: FULL  Family Communication: None at bedside  Consults called: Sidebarred nephrology   Status is: Inpatient  Remains inpatient appropriate because:Inpatient level of care appropriate due to severity of illness   Dispo: The patient is from: Home              Anticipated d/c is to: Home              Patient currently is not medically stable to d/c.   Difficult to place patient No  Time spent coordinating admission: 80 minutes  [facility]  If 7PM-7AM, please contact night-coverage [email or web address]  01/08/2021, 3:30 PM

## 2021-01-08 NOTE — ED Triage Notes (Signed)
BIB EMS from [facility] her is staying at for heroin overdose. Patient denies SI. Per ems was sitting on stairwell when they arrived.

## 2021-01-08 NOTE — Plan of Care (Signed)
  Problem: Education: Goal: Knowledge of General Education information will improve Description: Including pain rating scale, medication(s)/side effects and non-pharmacologic comfort measures Outcome: Progressing   Problem: Clinical Measurements: Goal: Will remain free from infection Outcome: Progressing Goal: Respiratory complications will improve Outcome: Progressing   Problem: Nutrition: Goal: Adequate nutrition will be maintained Outcome: Progressing   Problem: Safety: Goal: Ability to remain free from injury will improve Outcome: Progressing

## 2021-01-08 NOTE — ED Notes (Signed)
MD decreased [place] from 6lpm to 4lpm. Pt tolerating well

## 2021-01-08 NOTE — ED Notes (Signed)
CBG that read "high" was obtained from IV. Rechecked by finger stick. CBG was 165.

## 2021-01-09 DIAGNOSIS — T50901A Poisoning by unspecified drugs, medicaments and biological substances, accidental (unintentional), initial encounter: Secondary | ICD-10-CM

## 2021-01-09 LAB — RAPID URINE DRUG SCREEN, HOSP PERFORMED
Amphetamines: NOT DETECTED
Barbiturates: NOT DETECTED
Benzodiazepines: NOT DETECTED
Cocaine: POSITIVE — AB
Opiates: NOT DETECTED
Tetrahydrocannabinol: NOT DETECTED

## 2021-01-09 LAB — COMPREHENSIVE METABOLIC PANEL
ALT: 371 U/L — ABNORMAL HIGH (ref 0–44)
AST: 568 U/L — ABNORMAL HIGH (ref 15–41)
Albumin: 3 g/dL — ABNORMAL LOW (ref 3.5–5.0)
Alkaline Phosphatase: 70 U/L (ref 38–126)
Anion gap: 10 (ref 5–15)
BUN: 39 mg/dL — ABNORMAL HIGH (ref 6–20)
CO2: 22 mmol/L (ref 22–32)
Calcium: 7.7 mg/dL — ABNORMAL LOW (ref 8.9–10.3)
Chloride: 105 mmol/L (ref 98–111)
Creatinine, Ser: 2.22 mg/dL — ABNORMAL HIGH (ref 0.61–1.24)
GFR, Estimated: 34 mL/min — ABNORMAL LOW (ref >60)
Glucose, Bld: 142 mg/dL — ABNORMAL HIGH (ref 70–99)
Potassium: 4.1 mmol/L (ref 3.5–5.1)
Sodium: 137 mmol/L (ref 135–145)
Total Bilirubin: 0.2 mg/dL — ABNORMAL LOW (ref 0.3–1.2)
Total Protein: 6.1 g/dL — ABNORMAL LOW (ref 6.5–8.1)

## 2021-01-09 LAB — CK: Total CK: 2063 U/L — ABNORMAL HIGH (ref 49–397)

## 2021-01-09 LAB — CBC
HCT: 40.8 % (ref 39.0–52.0)
Hemoglobin: 13.1 g/dL (ref 13.0–17.0)
MCH: 31.3 pg (ref 26.0–34.0)
MCHC: 32.1 g/dL (ref 30.0–36.0)
MCV: 97.4 fL (ref 80.0–100.0)
Platelets: 219 10*3/uL (ref 150–400)
RBC: 4.19 MIL/uL — ABNORMAL LOW (ref 4.22–5.81)
RDW: 13.5 % (ref 11.5–15.5)
WBC: 19.5 10*3/uL — ABNORMAL HIGH (ref 4.0–10.5)
nRBC: 0 % (ref 0.0–0.2)

## 2021-01-09 LAB — HEPATITIS PANEL, ACUTE
HCV Ab: NONREACTIVE
Hep A IgM: NONREACTIVE
Hep B C IgM: NONREACTIVE
Hepatitis B Surface Ag: NONREACTIVE

## 2021-01-09 LAB — GLUCOSE, CAPILLARY
Glucose-Capillary: 157 mg/dL — ABNORMAL HIGH (ref 70–99)
Glucose-Capillary: 175 mg/dL — ABNORMAL HIGH (ref 70–99)
Glucose-Capillary: 166 mg/dL — ABNORMAL HIGH (ref 70–99)
Glucose-Capillary: 112 mg/dL — ABNORMAL HIGH (ref 70–99)

## 2021-01-09 LAB — PROCALCITONIN: Procalcitonin: 26.37 ng/mL

## 2021-01-09 MED ORDER — ENOXAPARIN SODIUM 60 MG/0.6ML ~~LOC~~ SOLN
50.0000 mg | SUBCUTANEOUS | Status: DC
  Filled 2021-01-09: qty 0.5
  Filled 2021-01-09: qty 0.6
  Filled 2021-01-09: qty 0.5

## 2021-01-09 MED ORDER — ADULT MULTIVITAMIN W/MINERALS CH
1.0000 | ORAL_TABLET | Freq: Every day | ORAL | Status: DC
  Administered 2021-01-09 – 2021-01-11 (×3): 1 via ORAL
  Filled 2021-01-09 (×3): qty 1

## 2021-01-09 MED ORDER — INSULIN ASPART 100 UNIT/ML ~~LOC~~ SOLN: 0.0000 [IU] | Freq: Every day | SUBCUTANEOUS | Status: DC

## 2021-01-09 MED ORDER — SODIUM CHLORIDE 0.9 % IV SOLN
INTRAVENOUS | Status: DC | PRN
  Administered 2021-01-09: 1000 mL via INTRAVENOUS

## 2021-01-09 MED ORDER — INSULIN ASPART 100 UNIT/ML ~~LOC~~ SOLN: 0.0000 [IU] | Freq: Three times a day (TID) | SUBCUTANEOUS | Status: DC

## 2021-01-09 MED ORDER — PROSOURCE PLUS PO LIQD
30.0000 mL | Freq: Two times a day (BID) | ORAL | Status: DC
  Administered 2021-01-09 – 2021-01-10 (×2): 30 mL via ORAL
  Filled 2021-01-09 (×2): qty 30

## 2021-01-09 NOTE — Progress Notes (Signed)
PROGRESS NOTE  Andrew [name]  GGY:[id number] DOB: 1966/07/19 DOA: 01/08/2021 PCP: Pcp, No   Brief Narrative: Andrew [name] is a 55 y.o. male with a history of substance abuse, T2DM who was found altered by EMS, brought to ED with suspicion for fentanyl overdose. Lethargy improved with low dose narcan, though he remains a sparse historian and has amnesia for the events surrounding his presentation to the ED. Labs revealed rhabdomyolysis with AKI with hyperkalemia and peaked T waves, and LFT elevations. CXR had opacity for which antibiotics were given in addition to potassium lowering agents and IV fluids.   Assessment & Plan: Active Problems:   Drug overdose  Suspected opioid overdose: Mentation cleared with narcan.  - UDS still not collected. - Monitor for withdrawal - CSW consulted   Acute toxic metabolic encephalopathy: Improved confusion, so likely due to intoxication of some kind.  - Delirium precautions - Can advance diet  Rhabdomyolysis:  - Continue IVF, no evidence of volume overload.  - Trend CK which has risen since admission.   Hyperkalemic acute kidney injury: Improved. U/S with normal appearing kidneys bilaterally. - With improved UOP, clearance, and durable normalization of potassium level will stop kayexalate and continue monitoring metabolic panel. Continue cardiac monitoring.   CAP: Bibasilar opacities on CXR, WBC 11 > 19. At risk for aspiration. - Continue empiric abx with ceftriaxone, flagyl. Check PCT.   LFT elevation: Improving. AST > ALT without cholecstatic features. Possibly related to substance and/or rhabdo. 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  - Continue to monitor for improvement.   T2DM: HbA1c 6.6% - SSI  Tobacco use:  - Cessation counseling  COPD:  - Follow up medication reconciliation.   HLD:  - Hold statin  Obesity: Estimated body mass index is 36.26 kg/m as calculated from the  following:   Height as of this encounter: 5\' 7"  (1.702 m).   Weight as of this encounter: 105 kg.  DVT prophylaxis: Lovenox Code Status: Full Family Communication: None at bedside Disposition Plan:  Status is: Inpatient  Remains inpatient appropriate because:Persistent severe electrolyte disturbances   Dispo: The patient is from: Home              Anticipated d/c is to: Home              Patient currently is not medically stable to d/c.   Difficult to place patient No  Consultants:   None  Procedures:   None  Antimicrobials:  Ceftriaxone, flagyl   Subjective: Resistant to interview this morning. When asked if he knows where he is, replies "yep," and with much coaxing says he's in a hospital. Has no recollection that he's able/willing to divulge about the circumstances before getting to the hospital. Says he couldn't have overdosed on anything because "you need something to overdose on." Amenable to staying in the hospital for fluids, abx, etc. requesting advanced diet and endorsing widespread generalized aching without focal pain.  Objective: Vitals:   01/09/21 0400 01/09/21 0500 01/09/21 0600 01/09/21 0800  BP: 101/66 116/70 113/72   Pulse: 85 83 84   Resp: 12 11 12    Temp:    98.4 F (36.9 C)  TempSrc:    Oral  SpO2: 95% 96% 97%   Weight:      Height:        Intake/Output Summary (Last 24 hours) at 01/09/2021 0837 Last data filed at 01/09/2021 0400 Gross per 24 hour  Intake 3046.76 ml  Output 825 ml  Net 2221.76 ml   Filed Weights   01/08/21 1242  Weight: 105 kg    Gen: 55 y.o. male in no distress Pulm: Non-labored breathing. Clear to auscultation bilaterally.  CV: Regular rate and rhythm. No murmur, rub, or gallop. No JVD, no pedal edema. GI: Abdomen soft, non-tender, non-distended, with normoactive bowel sounds. No organomegaly or masses felt. Ext: Warm, no deformities Skin: No rashes, lesions or ulcers on visualized skin Neuro: Alert and oriented.  MAEW Psych: Withdrawn and flattened affect.   Data Reviewed: I have personally reviewed following labs and imaging studies  CBC: Recent Labs  Lab 01/08/21 1254 01/09/21 0049  WBC 11.5* 19.5*  NEUTROABS 10.8*  --   HGB 16.9 13.1  HCT 53.4* 40.8  MCV 98.2 97.4  PLT 267 656   Basic Metabolic Panel: Recent Labs  Lab 01/08/21 1254 01/08/21 1739 01/08/21 2044 01/09/21 0049  NA 140  --   --  137  K 6.9* 4.5 4.9 4.1  CL 102  --   --  105  CO2 23  --   --  22  GLUCOSE 117* 148*  --  142*  BUN 35*  --   --  39*  CREATININE 2.79*  --   --  2.22*  CALCIUM 8.0*  --   --  7.7*   GFR: Estimated Creatinine Clearance: 43.4 mL/min (A) (by C-G formula based on SCr of 2.22 mg/dL (H)). Liver Function Tests: Recent Labs  Lab 01/08/21 1254 01/09/21 0049  AST 928* 568*  ALT 581* 371*  ALKPHOS 107 70  BILITOT 1.4* 0.2*  PROT 8.2* 6.1*  ALBUMIN 4.1 3.0*   No results for input(s): LIPASE, AMYLASE in the last 168 hours. No results for input(s): AMMONIA in the last 168 hours. Coagulation Profile: No results for input(s): INR, PROTIME in the last 168 hours. Cardiac Enzymes: Recent Labs  Lab 01/08/21 1254 01/09/21 0049  CKTOTAL 1,127* 2,063*   BNP (last 3 results) No results for input(s): PROBNP in the last 8760 hours. HbA1C: Recent Labs    01/08/21 1802  HGBA1C 6.6*   CBG: Recent Labs  Lab 01/08/21 1548 01/08/21 1935 01/08/21 2326 01/09/21 0318 01/09/21 0727  GLUCAP 165* 134* 169* 157* 175*   Lipid Profile: No results for input(s): CHOL, HDL, LDLCALC, TRIG, CHOLHDL, LDLDIRECT in the last 72 hours. Thyroid Function Tests: No results for input(s): TSH, T4TOTAL, FREET4, T3FREE, THYROIDAB in the last 72 hours. Anemia Panel: No results for input(s): VITAMINB12, FOLATE, FERRITIN, TIBC, IRON, RETICCTPCT in the last 72 hours. Urine analysis:    Component Value Date/Time   COLORURINE YELLOW 03/06/2018 1837   APPEARANCEUR CLEAR 03/06/2018 1837   LABSPEC 1.015 03/06/2018  1837   PHURINE 6.0 03/06/2018 1837   GLUCOSEU >=500 (A) 03/06/2018 1837   HGBUR NEGATIVE 03/06/2018 1837   BILIRUBINUR NEGATIVE 03/06/2018 1837   BILIRUBINUR NEG 12/09/2013 1050   KETONESUR NEGATIVE 03/06/2018 1837   PROTEINUR NEGATIVE 03/06/2018 1837   UROBILINOGEN 0.2 01/17/2015 1415   NITRITE NEGATIVE 03/06/2018 1837   LEUKOCYTESUR NEGATIVE 03/06/2018 1837   Recent Results (from the past 240 hour(s))  Resp Panel by RT-PCR (Flu A&B, Covid) Nasopharyngeal Swab     Status: None   Collection Time: 01/08/21  2:38 PM   Specimen: Nasopharyngeal Swab; Nasopharyngeal(NP) swabs in vial transport medium  Result Value Ref Range Status   SARS Coronavirus 2 by RT PCR NEGATIVE NEGATIVE Final    Comment: (NOTE) SARS-CoV-2 target nucleic acids are NOT DETECTED.  The SARS-CoV-2 RNA is generally detectable in upper respiratory specimens during the acute phase of infection. The lowest concentration of SARS-CoV-2 viral copies this assay can detect is 138 copies/mL. A negative result does not preclude SARS-Cov-2 infection and should not be used as the sole basis for treatment or other patient management decisions. A negative result may occur with  improper specimen collection/handling, submission of specimen other than nasopharyngeal swab, presence of viral mutation(s) within the areas targeted by this assay, and inadequate number of viral copies(<138 copies/mL). A negative result must be combined with clinical observations, patient history, and epidemiological information. The expected result is Negative.  Fact Sheet for Patients:  EntrepreneurPulse.com.au  Fact Sheet for Healthcare Providers:  IncredibleEmployment.be  This test is no t yet approved or cleared by the [place] FDA and  has been authorized for detection and/or diagnosis of SARS-CoV-2 by FDA under an Emergency Use Authorization (EUA). This EUA will remain  in effect (meaning this test can  be used) for the duration of the COVID-19 declaration under Section 564(b)(1) of the Act, 21 U.S.C.section 360bbb-3(b)(1), unless the authorization is terminated  or revoked sooner.       Influenza A by PCR NEGATIVE NEGATIVE Final   Influenza B by PCR NEGATIVE NEGATIVE Final    Comment: (NOTE) The Xpert Xpress SARS-CoV-2/FLU/RSV plus assay is intended as an aid in the diagnosis of influenza from Nasopharyngeal swab specimens and should not be used as a sole basis for treatment. Nasal washings and aspirates are unacceptable for Xpert Xpress SARS-CoV-2/FLU/RSV testing.  Fact Sheet for Patients: EntrepreneurPulse.com.au  Fact Sheet for Healthcare Providers: IncredibleEmployment.be  This test is not yet approved or cleared by the [place] FDA and has been authorized for detection and/or diagnosis of SARS-CoV-2 by FDA under an Emergency Use Authorization (EUA). This EUA will remain in effect (meaning this test can be used) for the duration of the COVID-19 declaration under Section 564(b)(1) of the Act, 21 U.S.C. section 360bbb-3(b)(1), unless the authorization is terminated or revoked.  Performed at [facility], [place] [address] [place], [place] [address]   MRSA PCR Screening     Status: None   Collection Time: 01/08/21  4:43 PM   Specimen: Nasal Mucosa; Nasopharyngeal  Result Value Ref Range Status   MRSA by PCR NEGATIVE NEGATIVE Final    Comment:        The GeneXpert MRSA Assay (FDA approved for NASAL specimens only), is one component of a comprehensive MRSA colonization surveillance program. It is not intended to diagnose MRSA infection nor to guide or monitor treatment for MRSA infections. Performed at [facility], [place] [address] [place], [place] [address]       Radiology Studies: US Abdomen Complete  Result Date: 01/08/2021 CLINICAL DATA:  Acute kidney injury.  Elevated LFTs. EXAM:  ABDOMEN ULTRASOUND COMPLETE COMPARISON:  None. FINDINGS: Gallbladder: No gallstones or wall thickening visualized. No sonographic Murphy sign noted by sonographer. Common bile duct: Diameter: Normal caliber, 2 mm Liver: 2.8 cm echogenic area within the left hepatic lobe may reflect hemangioma. Echogenic areas within the right lobe measure up to 1.9 cm and 1.1 cm, likely hemangiomas. These were seen on prior CT is and/or most compatible with hemangiomas. Area of geographic hyperechogenicity in the left hepatic lobe near the falciform ligament, likely fatty infiltration. Portal vein is patent on color Doppler imaging with normal direction of blood flow towards the liver. IVC: No abnormality visualized. Pancreas: Visualized portion unremarkable. Spleen: Size and  appearance within normal limits. Right Kidney: Length: 11.0 cm. Echogenicity within normal limits. No mass or hydronephrosis visualized. Left Kidney: Length: 11.4 cm. Echogenicity within normal limits. No mass or hydronephrosis visualized. Abdominal aorta: No aneurysm visualized. Other findings: None. IMPRESSION: Echogenic lesions within the liver which were previously seen and characterized by CT, most compatible with hemangiomas. Suspect focal fatty infiltration in the left hepatic lobe. No acute findings. Electronically Signed   By: Rolm Baptise M.D.   On: 01/08/2021 20:03   DG Chest Portable 1 View  Result Date: 01/08/2021 CLINICAL DATA:  Overdose. EXAM: PORTABLE CHEST 1 VIEW COMPARISON:  September 11, 2020. FINDINGS: The heart size and mediastinal contours are within normal limits. No pneumothorax is noted. Emphysematous disease is noted in the right upper lobe. Mild bibasilar atelectasis or infiltrates are noted. The visualized skeletal structures are unremarkable. IMPRESSION: Mild bibasilar subsegmental atelectasis or infiltrates. Aortic Atherosclerosis (ICD10-I70.0). Electronically Signed   By: Marijo Conception M.D.   On: 01/08/2021 13:40     Scheduled Meds: . Chlorhexidine Gluconate Cloth  6 each Topical Daily  . enoxaparin (LOVENOX) injection  40 mg Subcutaneous Q24H  . feeding supplement  1 Container Oral TID BM  . mouth rinse  15 mL Mouth Rinse BID   Continuous Infusions: . sodium chloride Stopped (01/09/21 0318)  . cefTRIAXone (ROCEPHIN)  IV Stopped (01/08/21 1608)  . metronidazole Stopped (01/09/21 0154)  . sodium bicarbonate in 0.45 NS mL infusion 125 mL/hr at 01/09/21 0400     LOS: 1 day   Time spent: 35 minutes.  Patrecia Pour, MD [facility] [email or web address] 01/09/2021, 8:37 AM

## 2021-01-09 NOTE — TOC Initial Note (Signed)
Transition of Care [facility]) - Initial/Assessment Note    Patient Details  Name: Andrew [name] MRN: [id number] Date of Birth: 03-25-1966  Transition of Care [facility]) CM/SW Contact:    Leeroy Cha, RN Phone Number: 01/09/2021, 8:22 AM  Clinical Narrative:                  55 y.o. male with medical history significant of substance abuse, DM2. Presenting AMS. He is intermittently awake with deep sternal rub but not participating in interview. History is from chart review. Per ED staff, patient was found altered by EMS. He apparently overdosed on fentanyl. He was brought to the ED for assistance. No other aggravating or alleviating symptoms.  ED Course: He was lethargic w/ slow respiratory rate. Given small dose narcan. Improved. Labs showed rhabdo, elevated LFTs, and AKI. He was started on rocephin and zithro for possible PNA on CXR. He was found to have elevated K+.TRH was called for admission.   PLAN: following for progression and toc needs. Will need substance abuse resources. Expected Discharge Plan: Home/Self Care Barriers to Discharge: Continued Medical Work up   Patient Goals and CMS Choice Patient states their goals for this hospitalization and ongoing recovery are:: lethargic unable to state      Expected Discharge Plan and Services Expected Discharge Plan: Home/Self Care   Discharge Planning Services: CM Consult   Living arrangements for the past 2 months: Apartment                                      Prior Living Arrangements/Services Living arrangements for the past 2 months: Apartment Lives with:: Self Patient language and need for interpreter reviewed:: Yes        Need for Family Participation in Patient Care: Yes (Comment) Care giver support system in place?: Yes (comment)   Criminal Activity/Legal Involvement Pertinent to Current Situation/Hospitalization: No - Comment as needed  Activities of Daily Living Home Assistive Devices/Equipment: Other  (Comment) (pt unable to answer questions) ADL Screening (condition at time of admission) Patient's cognitive ability adequate to safely complete daily activities?: No (pt currently not able to answer questions) Is the patient deaf or have difficulty hearing?: No Does the patient have difficulty seeing, even when wearing glasses/contacts?: No Does the patient have difficulty concentrating, remembering, or making decisions?: Yes Patient able to express need for assistance with ADLs?: No Does the patient have difficulty dressing or bathing?: Yes Independently performs ADLs?: No Communication: Needs assistance Is this a change from baseline?: Change from baseline, expected to last <3 days Dressing (OT): Needs assistance Is this a change from baseline?: Change from baseline, expected to last <3days Grooming: Needs assistance Is this a change from baseline?: Change from baseline, expected to last <3 days Feeding: Needs assistance Is this a change from baseline?: Change from baseline, expected to last <3 days Bathing: Needs assistance Is this a change from baseline?: Change from baseline, expected to last <3 days Toileting: Needs assistance Is this a change from baseline?: Change from baseline, expected to last <3 days In/Out Bed: Needs assistance Is this a change from baseline?: Change from baseline, expected to last <3 days Walks in Home: Needs assistance Is this a change from baseline?: Change from baseline, expected to last <3 days Does the patient have difficulty walking or climbing stairs?: Yes Weakness of Legs: None Weakness of Arms/Hands: None  Permission Sought/Granted  Emotional Assessment Appearance:: Appears stated age Attitude/Demeanor/Rapport: Unable to Assess Affect (typically observed): Unable to Assess Orientation: : Fluctuating Orientation (Suspected and/or reported Sundowners) Alcohol / Substance Use: Illicit Drugs Psych Involvement: No  (comment)  Admission diagnosis:  Hyperkalemia [E87.5] Drug overdose [T50.901A] Acute respiratory failure with hypoxia (HCC) [J96.01] AKI (acute kidney injury) ([place]) [N17.9] Opiate overdose, undetermined intent, initial encounter ([place]) [T40.604A] Aspiration pneumonia, unspecified aspiration pneumonia type, unspecified laterality, unspecified part of lung ([place]) [J69.0] Patient Active Problem List   Diagnosis Date Noted  . Drug overdose 01/08/2021  . Type 2 diabetes mellitus with complication, without long-term current use of insulin ([place]) 05/25/2020  . Educated about COVID-19 virus infection 02/23/2020  . Crack cocaine overdose ([place]) 09/28/2017  . Hypoglycemia 09/28/2017  . Hepatitis 09/28/2017  . Leucocytosis 09/28/2017  . Overdose 09/28/2017  . Arthritis of knee, degenerative 01/24/2015  . Right knee pain 01/07/2014  . Concern about STD in male without diagnosis 01/06/2014  . Dysuria 12/09/2013  . High cholesterol 10/25/2013  . GERD (gastroesophageal reflux disease) 09/20/2013  . History of positive PPD 07/17/2012  . Cocaine abuse ([place]) 04/02/2012  . Rhabdomyolysis 04/02/2012  . Hernia of abdominal wall 01/05/2011  . ERECTILE DYSFUNCTION, PSYCHOGENIC 02/13/2010  . BIPOLAR DISORDER UNSPECIFIED 01/31/2009  . TOBACCO ABUSE 01/31/2009  . ELEVATED BP READING WITHOUT DX HYPERTENSION 01/31/2009   PCP:  Pcp, No Pharmacy:   [id number] [place], [place] [address], [address] AT [facility] [address] [place], [address] [place] [address] Phone: [phone] Fax: [phone]  [id number] [address], [place] [address] Phone: [phone] Fax: [phone]     Social Determinants of Health (SDOH) Interventions    Readmission Risk Interventions No flowsheet data found.

## 2021-01-09 NOTE — Progress Notes (Signed)
Initial Nutrition Assessment  RD working remotely.  DOCUMENTATION CODES:   Obesity unspecified  INTERVENTION:  - continue Boost Breeze TID, each supplement provides 250 kcal and 9 grams of protein. - will order 30 ml Prosource Plus BID, each supplement provides 100 kcal and 15 grams protein.  - will order 1 tablet multivitamin with minerals/day. - complete NFPE when feasible.    NUTRITION DIAGNOSIS:   Increased nutrient needs related to acute illness as evidenced by estimated needs.  GOAL:   Patient will meet greater than or equal to 90% of their needs  MONITOR:   PO intake,Supplement acceptance,Labs,Weight trends  REASON FOR ASSESSMENT:   Malnutrition Screening Tool  ASSESSMENT:   55 y.o. male with medical history of polysubstance abuse, type 2 DM, HTN, HLD, arthritis, GERD, COPD, bipolar disorder, schizoaffective disorder, and depression. He was found altered by EMS, brought to ED with suspicion for fentanyl overdose. Lethargy improved with low dose Narcan. He was found to have rhabdomyolysis, AKI, hyperkalemia, and elevated LFTs. CXR showed opacity.  Diet advanced from NPO to CLD yesterday at 1744 and to Regular today at 0850.   He has not been seen by a [place] RD since 09/2017.   Weight yesterday was 231 lb and weight has been stable over the past 1 year. No information documented in the edema section of flow sheet.   Per notes: - suspected opioid OD - acute toxic metabolic encephalopathy  - rhabdomyolysis - AKI--improved - hx of type 2 DM with HgbA1c of 6.6%   Labs reviewed; CBGs: 157, 175, and 166 mg/dl, BUN: 39 mg/dl, creatinine: 2.22 mg/dl, Ca: 7.7 mg/dl, LFTs elevated, GFR: 34 ml/min.  Medications reviewed; sliding scale novolog. IVF; 1/2 NS-75 mEq sodium bicarb @ 125 ml/hr.     NUTRITION - FOCUSED PHYSICAL EXAM:  unable to complete at this time.   Diet Order:   Diet Order            Diet regular Room service appropriate? Yes; Fluid  consistency: Thin  Diet effective now                 EDUCATION NEEDS:   Not appropriate for education at this time  Skin:  Skin Assessment: Reviewed RN Assessment  Last BM:  4/4 (type 6 x1)  Height:   Ht Readings from Last 1 Encounters:  01/08/21 5\' 7"  (1.702 m)    Weight:   Wt Readings from Last 1 Encounters:  01/08/21 105 kg     Estimated Nutritional Needs:  Kcal:  2000-2200 kcal Protein:  100-110 grams Fluid:  >/= 2 L/day      Jarome Matin, MS, RD, LDN, CNSC Inpatient Clinical Dietitian RD pager # available in [facility]  After hours/weekend pager # available in [facility]

## 2021-01-10 ENCOUNTER — Other Ambulatory Visit: Payer: Self-pay

## 2021-01-10 DIAGNOSIS — N179 Acute kidney failure, unspecified: Secondary | ICD-10-CM

## 2021-01-10 DIAGNOSIS — R7989 Other specified abnormal findings of blood chemistry: Secondary | ICD-10-CM

## 2021-01-10 DIAGNOSIS — E875 Hyperkalemia: Secondary | ICD-10-CM

## 2021-01-10 LAB — COMPREHENSIVE METABOLIC PANEL
ALT: 206 U/L — ABNORMAL HIGH (ref 0–44)
AST: 134 U/L — ABNORMAL HIGH (ref 15–41)
Albumin: 3 g/dL — ABNORMAL LOW (ref 3.5–5.0)
Alkaline Phosphatase: 68 U/L (ref 38–126)
Anion gap: 6 (ref 5–15)
BUN: 17 mg/dL (ref 6–20)
CO2: 28 mmol/L (ref 22–32)
Calcium: 8.4 mg/dL — ABNORMAL LOW (ref 8.9–10.3)
Chloride: 104 mmol/L (ref 98–111)
Creatinine, Ser: 0.98 mg/dL (ref 0.61–1.24)
GFR, Estimated: >60 mL/min (ref >60)
Glucose, Bld: 214 mg/dL — ABNORMAL HIGH (ref 70–99)
Potassium: 3.6 mmol/L (ref 3.5–5.1)
Sodium: 138 mmol/L (ref 135–145)
Total Bilirubin: 0.9 mg/dL (ref 0.3–1.2)
Total Protein: 6 g/dL — ABNORMAL LOW (ref 6.5–8.1)

## 2021-01-10 LAB — GLUCOSE, CAPILLARY
Glucose-Capillary: 92 mg/dL (ref 70–99)
Glucose-Capillary: 206 mg/dL — ABNORMAL HIGH (ref 70–99)
Glucose-Capillary: 108 mg/dL — ABNORMAL HIGH (ref 70–99)
Glucose-Capillary: 168 mg/dL — ABNORMAL HIGH (ref 70–99)

## 2021-01-10 LAB — CK: Total CK: 811 U/L — ABNORMAL HIGH (ref 49–397)

## 2021-01-10 NOTE — Progress Notes (Signed)
PROGRESS NOTE    Andrew [name]  QJJ:[id number] DOB: [birth year] DOA: 01/08/2021 PCP: Pcp, No    Brief Narrative:   55 y.o. male with a history of substance abuse, T2DM who was found altered by EMS, brought to ED with suspicion for fentanyl overdose. Lethargy improved with low dose narcan, though he remains a sparse historian and has amnesia for the events surrounding his presentation to the ED. Labs revealed rhabdomyolysis with AKI with hyperkalemia and peaked T waves, and LFT elevations. CXR had opacity for which antibiotics were given in addition to potassium lowering agents and IV fluids  Assessment & Plan:   Active Problems:   Drug overdose  Suspected opioid and cocaine overdose: Mentation cleared with narcan.  - UDS reviewed, pos for cocaine -continue to monitor for withdrawals - CSW was consulted   Acute toxic metabolic encephalopathy: -Improved - Continue with delirium precautions  Rhabdomyolysis:  - Continue IVF, no evidence of volume overload.  - Pt had been refusing blood draws, but serial CK had been trending up recently - Will order repeat CK today - Continue IVF as tolerated  Hyperkalemic acute kidney injury: Improved. U/S with normal appearing kidneys bilaterally. - No significant change in renal function, baseline Cr is <1 -Potassium has normalized -Would repeat CMP in AM  CAP: Bibasilar opacities on CXR, WBC 11 > 19. At risk for aspiration. - Continue empiric abx with ceftriaxone, flagyl.  -Procalcitonin is elevated at 26.37  LFT elevation: Improving. AST > ALT without cholecstatic features. Possibly related to substance and/or rhabdo. 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  - LFT's trending down -Continue to hydrate per above -Repeat cmp in AM  T2DM: HbA1c 6.6% - Continue SSI as needed -Glucose trends are stable  Tobacco use:  - Cessation counseling  COPD:  - Follow up medication  reconciliation.   HLD:  - continue to hold statin given elevated LFT's  Obesity: Estimated body mass index is 36.26 kg/m as calculated from the following:   Height as of this encounter: 5\' 7"  (1.702 m).   Weight as of this encounter: 105 kg.   DVT prophylaxis: Lovenox subq Code Status: Full Family Communication: Pt in room, family not at bedside  Status is: Inpatient  Remains inpatient appropriate because:IV treatments appropriate due to intensity of illness or inability to take PO and Inpatient level of care appropriate due to severity of illness   Dispo: The patient is from: Home              Anticipated d/c is to: Home              Patient currently is not medically stable to d/c.   Difficult to place patient No    Consultants:     Procedures:     Antimicrobials: Anti-infectives (From admission, onward)   Start     Dose/Rate Route Frequency Ordered Stop   01/08/21 1600  metroNIDAZOLE (FLAGYL) IVPB 500 mg        500 mg 100 mL/hr over 60 Minutes Intravenous Every 8 hours 01/08/21 1443     01/08/21 1500  cefTRIAXone (ROCEPHIN) 2 g in sodium chloride 0.9 % 100 mL IVPB        2 g 200 mL/hr over 30 Minutes Intravenous Every 24 hours 01/08/21 1443         Subjective: Reports feeling "bad" this AM, unable to elaborate  Objective: Vitals:   01/10/21 0300 01/10/21 0400 01/10/21 0500 01/10/21 0800  BP: (!) 121/58 130/69 (!) 180/85 (!) 141/88  Pulse: 78 78 88 69  Resp: 13 15 16 15   Temp:  98.2 F (36.8 C)  98 F (36.7 C)  TempSrc:  Oral  Oral  SpO2: 95% 97% 95% 96%  Weight:      Height:        Intake/Output Summary (Last 24 hours) at 01/10/2021 1109 Last data filed at 01/10/2021 0518 Gross per 24 hour  Intake 4372.54 ml  Output 3600 ml  Net 772.54 ml   Filed Weights   01/08/21 1242  Weight: 105 kg    Examination: General exam: Awake, laying in bed, in nad, membranes appear dry Respiratory system: Normal respiratory effort, no  wheezing Cardiovascular system: regular rate, s1, s2 Gastrointestinal system: Soft, nondistended, positive BS Central nervous system: CN2-12 grossly intact, strength intact Extremities: Perfused, no clubbing Skin: Normal skin turgor, no notable skin lesions seen Psychiatry: Mood normal // no visual hallucinations   Data Reviewed: I have personally reviewed following labs and imaging studies  CBC: Recent Labs  Lab 01/08/21 1254 01/09/21 0049  WBC 11.5* 19.5*  NEUTROABS 10.8*  --   HGB 16.9 13.1  HCT 53.4* 40.8  MCV 98.2 97.4  PLT 267 570   Basic Metabolic Panel: Recent Labs  Lab 01/08/21 1254 01/08/21 1739 01/08/21 2044 01/09/21 0049  NA 140  --   --  137  K 6.9* 4.5 4.9 4.1  CL 102  --   --  105  CO2 23  --   --  22  GLUCOSE 117* 148*  --  142*  BUN 35*  --   --  39*  CREATININE 2.79*  --   --  2.22*  CALCIUM 8.0*  --   --  7.7*   GFR: Estimated Creatinine Clearance: 43.4 mL/min (A) (by C-G formula based on SCr of 2.22 mg/dL (H)). Liver Function Tests: Recent Labs  Lab 01/08/21 1254 01/09/21 0049  AST 928* 568*  ALT 581* 371*  ALKPHOS 107 70  BILITOT 1.4* 0.2*  PROT 8.2* 6.1*  ALBUMIN 4.1 3.0*   No results for input(s): LIPASE, AMYLASE in the last 168 hours. No results for input(s): AMMONIA in the last 168 hours. Coagulation Profile: No results for input(s): INR, PROTIME in the last 168 hours. Cardiac Enzymes: Recent Labs  Lab 01/08/21 1254 01/09/21 0049  CKTOTAL 1,127* 2,063*   BNP (last 3 results) No results for input(s): PROBNP in the last 8760 hours. HbA1C: Recent Labs    01/08/21 1802  HGBA1C 6.6*   CBG: Recent Labs  Lab 01/09/21 0318 01/09/21 0727 01/09/21 1140 01/09/21 1627 01/10/21 0812  GLUCAP 157* 175* 166* 112* 92   Lipid Profile: No results for input(s): CHOL, HDL, LDLCALC, TRIG, CHOLHDL, LDLDIRECT in the last 72 hours. Thyroid Function Tests: No results for input(s): TSH, T4TOTAL, FREET4, T3FREE, THYROIDAB in the last 72  hours. Anemia Panel: No results for input(s): VITAMINB12, FOLATE, FERRITIN, TIBC, IRON, RETICCTPCT in the last 72 hours. Sepsis Labs: Recent Labs  Lab 01/09/21 0049  PROCALCITON 26.37    Recent Results (from the past 240 hour(s))  Resp Panel by RT-PCR (Flu A&B, Covid) Nasopharyngeal Swab     Status: None   Collection Time: 01/08/21  2:38 PM   Specimen: Nasopharyngeal Swab; Nasopharyngeal(NP) swabs in vial transport medium  Result Value Ref Range Status   SARS Coronavirus 2 by RT PCR NEGATIVE NEGATIVE Final    Comment: (NOTE) SARS-CoV-2 target nucleic acids are NOT DETECTED.  The  SARS-CoV-2 RNA is generally detectable in upper respiratory specimens during the acute phase of infection. The lowest concentration of SARS-CoV-2 viral copies this assay can detect is 138 copies/mL. A negative result does not preclude SARS-Cov-2 infection and should not be used as the sole basis for treatment or other patient management decisions. A negative result may occur with  improper specimen collection/handling, submission of specimen other than nasopharyngeal swab, presence of viral mutation(s) within the areas targeted by this assay, and inadequate number of viral copies(<138 copies/mL). A negative result must be combined with clinical observations, patient history, and epidemiological information. The expected result is Negative.  Fact Sheet for Patients:  EntrepreneurPulse.com.au  Fact Sheet for Healthcare Providers:  IncredibleEmployment.be  This test is no t yet approved or cleared by the [place] FDA and  has been authorized for detection and/or diagnosis of SARS-CoV-2 by FDA under an Emergency Use Authorization (EUA). This EUA will remain  in effect (meaning this test can be used) for the duration of the COVID-19 declaration under Section 564(b)(1) of the Act, 21 U.S.C.section 360bbb-3(b)(1), unless the authorization is terminated  or revoked  sooner.       Influenza A by PCR NEGATIVE NEGATIVE Final   Influenza B by PCR NEGATIVE NEGATIVE Final    Comment: (NOTE) The Xpert Xpress SARS-CoV-2/FLU/RSV plus assay is intended as an aid in the diagnosis of influenza from Nasopharyngeal swab specimens and should not be used as a sole basis for treatment. Nasal washings and aspirates are unacceptable for Xpert Xpress SARS-CoV-2/FLU/RSV testing.  Fact Sheet for Patients: EntrepreneurPulse.com.au  Fact Sheet for Healthcare Providers: IncredibleEmployment.be  This test is not yet approved or cleared by the [place] FDA and has been authorized for detection and/or diagnosis of SARS-CoV-2 by FDA under an Emergency Use Authorization (EUA). This EUA will remain in effect (meaning this test can be used) for the duration of the COVID-19 declaration under Section 564(b)(1) of the Act, 21 U.S.C. section 360bbb-3(b)(1), unless the authorization is terminated or revoked.  Performed at [facility], [place] [address], [place], [place] [address]   MRSA PCR Screening     Status: None   Collection Time: 01/08/21  4:43 PM   Specimen: Nasal Mucosa; Nasopharyngeal  Result Value Ref Range Status   MRSA by PCR NEGATIVE NEGATIVE Final    Comment:        The GeneXpert MRSA Assay (FDA approved for NASAL specimens only), is one component of a comprehensive MRSA colonization surveillance program. It is not intended to diagnose MRSA infection nor to guide or monitor treatment for MRSA infections. Performed at [facility], [place] [address] [place], [place] [address]      Radiology Studies: US Abdomen Complete  Result Date: 01/08/2021 CLINICAL DATA:  Acute kidney injury.  Elevated LFTs. EXAM: ABDOMEN ULTRASOUND COMPLETE COMPARISON:  None. FINDINGS: Gallbladder: No gallstones or wall thickening visualized. No sonographic Murphy sign noted by sonographer. Common bile  duct: Diameter: Normal caliber, 2 mm Liver: 2.8 cm echogenic area within the left hepatic lobe may reflect hemangioma. Echogenic areas within the right lobe measure up to 1.9 cm and 1.1 cm, likely hemangiomas. These were seen on prior CT is and/or most compatible with hemangiomas. Area of geographic hyperechogenicity in the left hepatic lobe near the falciform ligament, likely fatty infiltration. Portal vein is patent on color Doppler imaging with normal direction of blood flow towards the liver. IVC: No abnormality visualized. Pancreas: Visualized portion unremarkable. Spleen: Size and appearance within  normal limits. Right Kidney: Length: 11.0 cm. Echogenicity within normal limits. No mass or hydronephrosis visualized. Left Kidney: Length: 11.4 cm. Echogenicity within normal limits. No mass or hydronephrosis visualized. Abdominal aorta: No aneurysm visualized. Other findings: None. IMPRESSION: Echogenic lesions within the liver which were previously seen and characterized by CT, most compatible with hemangiomas. Suspect focal fatty infiltration in the left hepatic lobe. No acute findings. Electronically Signed   By: Rolm Baptise M.D.   On: 01/08/2021 20:03   DG Chest Portable 1 View  Result Date: 01/08/2021 CLINICAL DATA:  Overdose. EXAM: PORTABLE CHEST 1 VIEW COMPARISON:  September 11, 2020. FINDINGS: The heart size and mediastinal contours are within normal limits. No pneumothorax is noted. Emphysematous disease is noted in the right upper lobe. Mild bibasilar atelectasis or infiltrates are noted. The visualized skeletal structures are unremarkable. IMPRESSION: Mild bibasilar subsegmental atelectasis or infiltrates. Aortic Atherosclerosis (ICD10-I70.0). Electronically Signed   By: Marijo Conception M.D.   On: 01/08/2021 13:40    Scheduled Meds: . (feeding supplement) PROSource Plus  30 mL Oral BID BM  . Chlorhexidine Gluconate Cloth  6 each Topical Daily  . enoxaparin (LOVENOX) injection  50 mg Subcutaneous  Q24H  . feeding supplement  1 Container Oral TID BM  . insulin aspart  0-5 Units Subcutaneous QHS  . insulin aspart  0-9 Units Subcutaneous TID WC  . mouth rinse  15 mL Mouth Rinse BID  . multivitamin with minerals  1 tablet Oral Daily   Continuous Infusions: . sodium chloride Stopped (01/09/21 0318)  . cefTRIAXone (ROCEPHIN)  IV Stopped (01/09/21 1708)  . metronidazole Stopped (01/10/21 1000)  . sodium bicarbonate in 0.45 NS mL infusion 125 mL/hr at 01/10/21 0518     LOS: 2 days   Marylu Lund, MD [facility] Pager On Amion  If 7PM-7AM, please contact night-coverage 01/10/2021, 11:09 AM

## 2021-01-10 NOTE — Progress Notes (Signed)
Pt has upper partial teeth. Would not keep in mouth and found them in the bed a couple times. PM RN offered him a cup for them but he refused. Pt transferred to [place] and stated he could not find his partial. Bed searched and cafeteria called but not found. Not sure what he did with them. Pt also had lower partial but did not see them during the shift. RN on [place] notified about the situation.

## 2021-01-10 NOTE — Progress Notes (Addendum)
Patient is refusing lab draws. Education provided on why lab draws were needed and informed about how refusal can interfere with his care.

## 2021-01-11 LAB — COMPREHENSIVE METABOLIC PANEL
ALT: 150 U/L — ABNORMAL HIGH (ref 0–44)
AST: 74 U/L — ABNORMAL HIGH (ref 15–41)
Albumin: 2.8 g/dL — ABNORMAL LOW (ref 3.5–5.0)
Alkaline Phosphatase: 66 U/L (ref 38–126)
Anion gap: 8 (ref 5–15)
BUN: 14 mg/dL (ref 6–20)
CO2: 25 mmol/L (ref 22–32)
Calcium: 8.5 mg/dL — ABNORMAL LOW (ref 8.9–10.3)
Chloride: 105 mmol/L (ref 98–111)
Creatinine, Ser: 0.9 mg/dL (ref 0.61–1.24)
GFR, Estimated: >60 mL/min (ref >60)
Glucose, Bld: 102 mg/dL — ABNORMAL HIGH (ref 70–99)
Potassium: 3.9 mmol/L (ref 3.5–5.1)
Sodium: 138 mmol/L (ref 135–145)
Total Bilirubin: 0.7 mg/dL (ref 0.3–1.2)
Total Protein: 5.6 g/dL — ABNORMAL LOW (ref 6.5–8.1)

## 2021-01-11 LAB — GLUCOSE, CAPILLARY
Glucose-Capillary: >600 mg/dL (ref 70–99)
Glucose-Capillary: 100 mg/dL — ABNORMAL HIGH (ref 70–99)

## 2021-01-11 MED ORDER — CEFDINIR 300 MG PO CAPS: 300.0000 mg | ORAL_CAPSULE | Freq: Two times a day (BID) | ORAL | 0 refills | Status: AC

## 2021-01-11 MED ORDER — METRONIDAZOLE 500 MG PO TABS: 500.0000 mg | ORAL_TABLET | Freq: Three times a day (TID) | ORAL | 0 refills | Status: AC

## 2021-01-11 MED ORDER — METFORMIN HCL 500 MG PO TABS: 500.0000 mg | ORAL_TABLET | Freq: Two times a day (BID) | ORAL | 0 refills | Status: DC

## 2021-01-11 NOTE — Discharge Summary (Signed)
Physician Discharge Summary  Andrew [name] YSA:[id number] DOB: 1966/08/25 DOA: 01/08/2021  PCP: Merryl Hacker, No  Admit date: 01/08/2021 Discharge date: 01/11/2021  Admitted From: Home Disposition:  Home  Recommendations for Outpatient Follow-up:  1. Follow up with PCP in 1-2 weeks  Discharge Condition:Improved CODE STATUS:Full Diet recommendation: Diabetic   Brief/Interim Summary: 56 y.o.malewith a history of substance abuse, T2DM who was found altered by EMS, brought to ED with suspicion for fentanyl overdose. Lethargy improved with low dose narcan, though he remains a sparse historian and has amnesia for the events surrounding his presentation to the ED. Labs revealed rhabdomyolysis with AKI with hyperkalemia and peaked T waves, and LFT elevations. CXR had opacity for which antibiotics were given in addition to potassium lowering agents and IV fluids  Discharge Diagnoses:  Active Problems:   Drug overdose  Suspected opioid and cocaine overdose: Mentation cleared with narcan.  - UDS reviewed, pos for cocaine - CSW was consulted this visit  Acute toxic metabolic encephalopathy: -Improved  Rhabdomyolysis:  - Continued IVF, no evidence of volume overload.  - Pt had been refusing blood draws, but serial CK had been trending up recently - Repeat CK trended down to below 1,000  Hyperkalemic acute kidney injury: Improved. U/S with normal appearing kidneys bilaterally. - renal function normalized with IVF  CAP: Bibasilar opacities on CXR, WBC 11 > 19. At risk for aspiration. - Continue empiric abx with ceftriaxone, flagyl.  -Procalcitonin is elevated at 26.37 -would complete course of abx on d/c  LFT elevation: Improving. AST >ALT without cholecstatic features. Possibly related to substance and/or rhabdo. 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  - LFT's trending down -held home statin given elevated LFT's  T2DM: HbA1c 6.6% -  Continued SSI as needed -Glucose trends are stable -Pt had been on glipizide prior to admit, would prescribe metformin on d/c   Tobacco use:  - Cessation counseling  COPD:  - Follow up medication reconciliation.   HLD:  - continue to hold statin given elevated LFT's  Obesity:Estimated body mass index is 36.26 kg/m as calculated from the following: Height as of this encounter: '5\' 7"'  (1.702 m). Weight as of this encounter: 105 kg.  Discharge Instructions   Allergies as of 01/11/2021      Reactions   Mushroom Extract Complex Hives   Penicillins Hives   Other Other (See Comments)   Mushroom: Hives   Penicillins Hives   Has patient had a PCN reaction causing immediate rash, facial/tongue/throat swelling, SOB or lightheadedness with hypotension: no Has patient had a PCN reaction causing severe rash involving mucus membranes or skin necrosis: no Has patient had a PCN reaction that required hospitalization: no Has patient had a PCN reaction occurring within the last 10 years: yes If all of the above answers are "NO", then may proceed with Cephalosporin use.      Medication List    STOP taking these medications   atorvastatin 10 MG tablet Commonly known as: LIPITOR   glipiZIDE 5 MG tablet Commonly known as: GLUCOTROL   Vitamin D (Ergocalciferol) 1.25 MG (50000 UNIT) Caps capsule Commonly known as: DRISDOL     TAKE these medications   Accu-Chek Guide test strip Generic drug: glucose blood Check sugar twice a day.  Dx Code: E11.9   cefdinir 300 MG capsule Commonly known as: OMNICEF Take 1 capsule (300 mg total) by mouth 2 (two) times daily for 3 days.   colchicine 0.6 MG tablet Take 1  tablet (0.6 mg total) by mouth 2 (two) times daily.   glucose monitoring kit monitoring kit 1 each by Does not apply route as needed for other.   metFORMIN 500 MG tablet Commonly known as: GLUCOPHAGE Take 1 tablet (500 mg total) by mouth 2 (two) times daily with a meal.    metroNIDAZOLE 500 MG tablet Commonly known as: Flagyl Take 1 tablet (500 mg total) by mouth 3 (three) times daily for 3 days.   OneTouch Delica Lancets 94I Misc USE TO CHECK BLOOD SUGAR 3 TIMES DAILY. DX E11.9   Accu-Chek Softclix Lancets lancets Check sugar twice a day.  Dx Code: E11.9       Follow-up Information    Follow up with your PCP in 1-2 weeks. Schedule an appointment as soon as possible for a visit.              Allergies  Allergen Reactions  . Mushroom Extract Complex Hives  . Penicillins Hives  . Other Other (See Comments)    Mushroom: Hives  . Penicillins Hives    Has patient had a PCN reaction causing immediate rash, facial/tongue/throat swelling, SOB or lightheadedness with hypotension: no Has patient had a PCN reaction causing severe rash involving mucus membranes or skin necrosis: no Has patient had a PCN reaction that required hospitalization: no Has patient had a PCN reaction occurring within the last 10 years: yes If all of the above answers are "NO", then may proceed with Cephalosporin use.    Procedures/Studies: US Abdomen Complete  Result Date: 01/08/2021 CLINICAL DATA:  Acute kidney injury.  Elevated LFTs. EXAM: ABDOMEN ULTRASOUND COMPLETE COMPARISON:  None. FINDINGS: Gallbladder: No gallstones or wall thickening visualized. No sonographic Murphy sign noted by sonographer. Common bile duct: Diameter: Normal caliber, 2 mm Liver: 2.8 cm echogenic area within the left hepatic lobe may reflect hemangioma. Echogenic areas within the right lobe measure up to 1.9 cm and 1.1 cm, likely hemangiomas. These were seen on prior CT is and/or most compatible with hemangiomas. Area of geographic hyperechogenicity in the left hepatic lobe near the falciform ligament, likely fatty infiltration. Portal vein is patent on color Doppler imaging with normal direction of blood flow towards the liver. IVC: No abnormality visualized. Pancreas: Visualized portion unremarkable.  Spleen: Size and appearance within normal limits. Right Kidney: Length: 11.0 cm. Echogenicity within normal limits. No mass or hydronephrosis visualized. Left Kidney: Length: 11.4 cm. Echogenicity within normal limits. No mass or hydronephrosis visualized. Abdominal aorta: No aneurysm visualized. Other findings: None. IMPRESSION: Echogenic lesions within the liver which were previously seen and characterized by CT, most compatible with hemangiomas. Suspect focal fatty infiltration in the left hepatic lobe. No acute findings. Electronically Signed   By: Rolm Baptise M.D.   On: 01/08/2021 20:03   DG Chest Portable 1 View  Result Date: 01/08/2021 CLINICAL DATA:  Overdose. EXAM: PORTABLE CHEST 1 VIEW COMPARISON:  September 11, 2020. FINDINGS: The heart size and mediastinal contours are within normal limits. No pneumothorax is noted. Emphysematous disease is noted in the right upper lobe. Mild bibasilar atelectasis or infiltrates are noted. The visualized skeletal structures are unremarkable. IMPRESSION: Mild bibasilar subsegmental atelectasis or infiltrates. Aortic Atherosclerosis (ICD10-I70.0). Electronically Signed   By: Marijo Conception M.D.   On: 01/08/2021 13:40     Subjective: Without sob  Discharge Exam: Vitals:   01/11/21 0203 01/11/21 0533  BP: (!) 129/93 139/87  Pulse: 68 68  Resp: 16 16  Temp: 98 F (36.7 C) 98.2 F (  36.8 C)  SpO2: 97% 100%   Vitals:   01/10/21 1848 01/10/21 2149 01/11/21 0203 01/11/21 0533  BP: (!) 147/96 (!) 144/86 (!) 129/93 139/87  Pulse: 85 85 68 68  Resp: '18 16 16 16  ' Temp: 98.5 F (36.9 C) 99.4 F (37.4 C) 98 F (36.7 C) 98.2 F (36.8 C)  TempSrc: Oral Oral Oral Oral  SpO2: 96% 97% 97% 100%  Weight:      Height:        General: Pt is alert, awake, not in acute distress Cardiovascular: RRR, S1/S2 +, no rubs, no gallops Respiratory: CTA bilaterally, no wheezing, no rhonchi Abdominal: Soft, NT, ND, bowel sounds + Extremities: no edema, no  cyanosis   The results of significant diagnostics from this hospitalization (including imaging, microbiology, ancillary and laboratory) are listed below for reference.     Microbiology: Recent Results (from the past 240 hour(s))  Resp Panel by RT-PCR (Flu A&B, Covid) Nasopharyngeal Swab     Status: None   Collection Time: 01/08/21  2:38 PM   Specimen: Nasopharyngeal Swab; Nasopharyngeal(NP) swabs in vial transport medium  Result Value Ref Range Status   SARS Coronavirus 2 by RT PCR NEGATIVE NEGATIVE Final    Comment: (NOTE) SARS-CoV-2 target nucleic acids are NOT DETECTED.  The SARS-CoV-2 RNA is generally detectable in upper respiratory specimens during the acute phase of infection. The lowest concentration of SARS-CoV-2 viral copies this assay can detect is 138 copies/mL. A negative result does not preclude SARS-Cov-2 infection and should not be used as the sole basis for treatment or other patient management decisions. A negative result may occur with  improper specimen collection/handling, submission of specimen other than nasopharyngeal swab, presence of viral mutation(s) within the areas targeted by this assay, and inadequate number of viral copies(<138 copies/mL). A negative result must be combined with clinical observations, patient history, and epidemiological information. The expected result is Negative.  Fact Sheet for Patients:  EntrepreneurPulse.com.au  Fact Sheet for Healthcare Providers:  IncredibleEmployment.be  This test is no t yet approved or cleared by the [place] FDA and  has been authorized for detection and/or diagnosis of SARS-CoV-2 by FDA under an Emergency Use Authorization (EUA). This EUA will remain  in effect (meaning this test can be used) for the duration of the COVID-19 declaration under Section 564(b)(1) of the Act, 21 U.S.C.section 360bbb-3(b)(1), unless the authorization is terminated  or revoked  sooner.       Influenza A by PCR NEGATIVE NEGATIVE Final   Influenza B by PCR NEGATIVE NEGATIVE Final    Comment: (NOTE) The Xpert Xpress SARS-CoV-2/FLU/RSV plus assay is intended as an aid in the diagnosis of influenza from Nasopharyngeal swab specimens and should not be used as a sole basis for treatment. Nasal washings and aspirates are unacceptable for Xpert Xpress SARS-CoV-2/FLU/RSV testing.  Fact Sheet for Patients: EntrepreneurPulse.com.au  Fact Sheet for Healthcare Providers: IncredibleEmployment.be  This test is not yet approved or cleared by the [place] FDA and has been authorized for detection and/or diagnosis of SARS-CoV-2 by FDA under an Emergency Use Authorization (EUA). This EUA will remain in effect (meaning this test can be used) for the duration of the COVID-19 declaration under Section 564(b)(1) of the Act, 21 U.S.C. section 360bbb-3(b)(1), unless the authorization is terminated or revoked.  Performed at [facility], [place] [address], [place], [place] [address]   MRSA PCR Screening     Status: None   Collection Time: 01/08/21  4:43 PM  Specimen: Nasal Mucosa; Nasopharyngeal  Result Value Ref Range Status   MRSA by PCR NEGATIVE NEGATIVE Final    Comment:        The GeneXpert MRSA Assay (FDA approved for NASAL specimens only), is one component of a comprehensive MRSA colonization surveillance program. It is not intended to diagnose MRSA infection nor to guide or monitor treatment for MRSA infections. Performed at [facility], [place] [address] [place], [place] [address]   Culture, blood (single)     Status: None (Preliminary result)   Collection Time: 01/10/21 11:24 AM   Specimen: BLOOD  Result Value Ref Range Status   Specimen Description   Final    BLOOD RIGHT ANTECUBITAL Performed at [facility] [address], [place], [place] [address]     Special Requests   Final    BOTTLES DRAWN AEROBIC ONLY Blood Culture adequate volume Performed at [facility] [address], [place], [place] [address]    Culture   Final    NO GROWTH < 12 HOURS Performed at [facility] [address] [place], [place] [address]    Report Status PENDING  Incomplete     Labs: BNP (last 3 results) No results for input(s): BNP in the last 8760 hours. Basic Metabolic Panel: Recent Labs  Lab 01/08/21 1254 01/08/21 1739 01/08/21 2044 01/09/21 0049 01/10/21 1122 01/11/21 0449  NA 140  --   --  137 138 138  K 6.9* 4.5 4.9 4.1 3.6 3.9  CL 102  --   --  105 104 105  CO2 23  --   --  '22 28 25  ' GLUCOSE 117* 148*  --  142* 214* 102*  BUN 35*  --   --  39* 17 14  CREATININE 2.79*  --   --  2.22* 0.98 0.90  CALCIUM 8.0*  --   --  7.7* 8.4* 8.5*   Liver Function Tests: Recent Labs  Lab 01/08/21 1254 01/09/21 0049 01/10/21 1122 01/11/21 0449  AST 928* 568* 134* 74*  ALT 581* 371* 206* 150*  ALKPHOS 107 70 68 66  BILITOT 1.4* 0.2* 0.9 0.7  PROT 8.2* 6.1* 6.0* 5.6*  ALBUMIN 4.1 3.0* 3.0* 2.8*   No results for input(s): LIPASE, AMYLASE in the last 168 hours. No results for input(s): AMMONIA in the last 168 hours. CBC: Recent Labs  Lab 01/08/21 1254 01/09/21 0049  WBC 11.5* 19.5*  NEUTROABS 10.8*  --   HGB 16.9 13.1  HCT 53.4* 40.8  MCV 98.2 97.4  PLT 267 219   Cardiac Enzymes: Recent Labs  Lab 01/08/21 1254 01/09/21 0049 01/10/21 1122  CKTOTAL 1,127* 2,063* 811*   BNP: Invalid input(s): POCBNP CBG: Recent Labs  Lab 01/10/21 0812 01/10/21 1203 01/10/21 1623 01/10/21 2152 01/11/21 0722  GLUCAP 92 206* 108* 168* 100*   D-Dimer No results for input(s): DDIMER in the last 72 hours. Hgb A1c Recent Labs    01/08/21 1802  HGBA1C 6.6*   Lipid Profile No results for input(s): CHOL, HDL, LDLCALC, TRIG, CHOLHDL, LDLDIRECT in the last 72 hours. Thyroid function studies No results for input(s):  TSH, T4TOTAL, T3FREE, THYROIDAB in the last 72 hours.  Invalid input(s): FREET3 Anemia work up No results for input(s): VITAMINB12, FOLATE, FERRITIN, TIBC, IRON, RETICCTPCT in the last 72 hours. Urinalysis    Component Value Date/Time   COLORURINE YELLOW 03/06/2018 1837   APPEARANCEUR CLEAR 03/06/2018 1837   LABSPEC 1.015 03/06/2018 1837  PHURINE 6.0 03/06/2018 1837   GLUCOSEU >=500 (A) 03/06/2018 1837   HGBUR NEGATIVE 03/06/2018 1837   BILIRUBINUR NEGATIVE 03/06/2018 1837   BILIRUBINUR NEG 12/09/2013 1050   KETONESUR NEGATIVE 03/06/2018 1837   PROTEINUR NEGATIVE 03/06/2018 1837   UROBILINOGEN 0.2 01/17/2015 1415   NITRITE NEGATIVE 03/06/2018 1837   LEUKOCYTESUR NEGATIVE 03/06/2018 1837   Sepsis Labs Invalid input(s): PROCALCITONIN,  WBC,  LACTICIDVEN Microbiology Recent Results (from the past 240 hour(s))  Resp Panel by RT-PCR (Flu A&B, Covid) Nasopharyngeal Swab     Status: None   Collection Time: 01/08/21  2:38 PM   Specimen: Nasopharyngeal Swab; Nasopharyngeal(NP) swabs in vial transport medium  Result Value Ref Range Status   SARS Coronavirus 2 by RT PCR NEGATIVE NEGATIVE Final    Comment: (NOTE) SARS-CoV-2 target nucleic acids are NOT DETECTED.  The SARS-CoV-2 RNA is generally detectable in upper respiratory specimens during the acute phase of infection. The lowest concentration of SARS-CoV-2 viral copies this assay can detect is 138 copies/mL. A negative result does not preclude SARS-Cov-2 infection and should not be used as the sole basis for treatment or other patient management decisions. A negative result may occur with  improper specimen collection/handling, submission of specimen other than nasopharyngeal swab, presence of viral mutation(s) within the areas targeted by this assay, and inadequate number of viral copies(<138 copies/mL). A negative result must be combined with clinical observations, patient history, and epidemiological information. The expected  result is Negative.  Fact Sheet for Patients:  EntrepreneurPulse.com.au  Fact Sheet for Healthcare Providers:  IncredibleEmployment.be  This test is no t yet approved or cleared by the [place] FDA and  has been authorized for detection and/or diagnosis of SARS-CoV-2 by FDA under an Emergency Use Authorization (EUA). This EUA will remain  in effect (meaning this test can be used) for the duration of the COVID-19 declaration under Section 564(b)(1) of the Act, 21 U.S.C.section 360bbb-3(b)(1), unless the authorization is terminated  or revoked sooner.       Influenza A by PCR NEGATIVE NEGATIVE Final   Influenza B by PCR NEGATIVE NEGATIVE Final    Comment: (NOTE) The Xpert Xpress SARS-CoV-2/FLU/RSV plus assay is intended as an aid in the diagnosis of influenza from Nasopharyngeal swab specimens and should not be used as a sole basis for treatment. Nasal washings and aspirates are unacceptable for Xpert Xpress SARS-CoV-2/FLU/RSV testing.  Fact Sheet for Patients: EntrepreneurPulse.com.au  Fact Sheet for Healthcare Providers: IncredibleEmployment.be  This test is not yet approved or cleared by the [place] FDA and has been authorized for detection and/or diagnosis of SARS-CoV-2 by FDA under an Emergency Use Authorization (EUA). This EUA will remain in effect (meaning this test can be used) for the duration of the COVID-19 declaration under Section 564(b)(1) of the Act, 21 U.S.C. section 360bbb-3(b)(1), unless the authorization is terminated or revoked.  Performed at [facility], [place] [address], [place], [place] [address]   MRSA PCR Screening     Status: None   Collection Time: 01/08/21  4:43 PM   Specimen: Nasal Mucosa; Nasopharyngeal  Result Value Ref Range Status   MRSA by PCR NEGATIVE NEGATIVE Final    Comment:        The GeneXpert MRSA Assay (FDA approved for NASAL  specimens only), is one component of a comprehensive MRSA colonization surveillance program. It is not intended to diagnose MRSA infection nor to guide or monitor treatment for MRSA infections. Performed at [facility], [place] [address] [place], [place] [address]   Culture, blood (single)     Status: None (Preliminary result)   Collection Time: 01/10/21 11:24 AM   Specimen: BLOOD  Result Value Ref Range Status   Specimen Description   Final    BLOOD RIGHT ANTECUBITAL Performed at [facility] [address], [place], [place] [address]    Special Requests   Final    BOTTLES DRAWN AEROBIC ONLY Blood Culture adequate volume Performed at [facility] [address] [place], [place] [address]    Culture   Final    NO GROWTH < 12 HOURS Performed at [facility] [address], [place], [place] [address]    Report Status PENDING  Incomplete   Time spent: 30 min  SIGNED:   Marylu Lund, MD  [facility] 01/11/2021, 9:21 AM  If 7PM-7AM, please contact night-coverage

## 2021-01-15 LAB — CULTURE, BLOOD (SINGLE)
Culture: NO GROWTH
Special Requests: ADEQUATE

## 2021-03-01 DIAGNOSIS — Z6829 Body mass index (BMI) 29.0-29.9, adult: Secondary | ICD-10-CM | POA: Diagnosis not present

## 2021-03-01 DIAGNOSIS — E663 Overweight: Secondary | ICD-10-CM | POA: Diagnosis not present

## 2021-03-01 DIAGNOSIS — Z0000 Encounter for general adult medical examination without abnormal findings: Secondary | ICD-10-CM | POA: Diagnosis not present

## 2021-03-01 DIAGNOSIS — E119 Type 2 diabetes mellitus without complications: Secondary | ICD-10-CM | POA: Diagnosis not present

## 2021-03-01 DIAGNOSIS — Z008 Encounter for other general examination: Secondary | ICD-10-CM | POA: Diagnosis not present

## 2021-03-13 ENCOUNTER — Telehealth: Payer: Self-pay | Admitting: Cardiology

## 2021-03-13 NOTE — Telephone Encounter (Signed)
Left message for patient to call and discuss scheduling the CT chest without contrast ordered by Dr. Percival Spanish

## 2021-03-14 NOTE — Telephone Encounter (Signed)
Left message for patient to call and discuss scheduling the CT chest w/o contrast ordered by Dr. Percival Spanish

## 2021-03-16 NOTE — Telephone Encounter (Signed)
Left message for patient to call and discuss scheduling the CT chest w/o contrast ordered by Dr. Percival Spanish

## 2021-03-26 ENCOUNTER — Encounter: Payer: Self-pay | Admitting: *Deleted

## 2021-04-16 ENCOUNTER — Emergency Department (HOSPITAL_COMMUNITY)
Admission: EM | Admit: 2021-04-16 | Discharge: 2021-04-16 | Disposition: A | Discharge status: Home / Self Care | Payer: Medicare Other | Attending: Emergency Medicine | Admitting: Emergency Medicine

## 2021-04-16 ENCOUNTER — Emergency Department (HOSPITAL_COMMUNITY): Payer: Medicare Other

## 2021-04-16 ENCOUNTER — Other Ambulatory Visit: Payer: Self-pay

## 2021-04-16 ENCOUNTER — Encounter (HOSPITAL_COMMUNITY): Payer: Self-pay

## 2021-04-16 DIAGNOSIS — Y92830 Public park as the place of occurrence of the external cause: Secondary | ICD-10-CM | POA: Insufficient documentation

## 2021-04-16 DIAGNOSIS — F17210 Nicotine dependence, cigarettes, uncomplicated: Secondary | ICD-10-CM | POA: Diagnosis not present

## 2021-04-16 DIAGNOSIS — I10 Essential (primary) hypertension: Secondary | ICD-10-CM | POA: Diagnosis not present

## 2021-04-16 DIAGNOSIS — M25562 Pain in left knee: Secondary | ICD-10-CM | POA: Diagnosis present

## 2021-04-16 DIAGNOSIS — R519 Headache, unspecified: Secondary | ICD-10-CM | POA: Insufficient documentation

## 2021-04-16 DIAGNOSIS — M25552 Pain in left hip: Secondary | ICD-10-CM | POA: Insufficient documentation

## 2021-04-16 DIAGNOSIS — R0781 Pleurodynia: Secondary | ICD-10-CM | POA: Insufficient documentation

## 2021-04-16 DIAGNOSIS — E119 Type 2 diabetes mellitus without complications: Secondary | ICD-10-CM | POA: Insufficient documentation

## 2021-04-16 DIAGNOSIS — Z96651 Presence of right artificial knee joint: Secondary | ICD-10-CM | POA: Diagnosis not present

## 2021-04-16 DIAGNOSIS — W19XXXA Unspecified fall, initial encounter: Secondary | ICD-10-CM | POA: Insufficient documentation

## 2021-04-16 DIAGNOSIS — J449 Chronic obstructive pulmonary disease, unspecified: Secondary | ICD-10-CM | POA: Insufficient documentation

## 2021-04-16 MED ORDER — NAPROXEN 500 MG PO TABS
500.0000 mg | ORAL_TABLET | Freq: Once | ORAL | Status: DC
  Filled 2021-04-16: qty 1

## 2021-04-16 NOTE — ED Provider Notes (Signed)
[facility] Emergency Department Provider Note MRN:  [id number]  Arrival date & time: 04/16/21     Chief Complaint   Fall, Hip Pain, and Shoulder Pain   History of Present Illness   Andrew [name] is a 55 y.o. year-old male with a history of bipolar disorder, diabetes, COPD, polysubstance abuse presenting to the ED with chief complaint of fall.  Patient fell and is having pain to the left knee, left hip, left ribs.  Also hit his head on the left side.  Having headache as well.  Unsure how he fell.  Thinks he "blacked out".  Denies alcohol or drugs this evening.  Denies chest pain, no shortness of breath, no abdominal pain, no other complaints.  Pain is constant, moderate to severe, worse with motion or palpation.    Review of Systems  A complete 10 system review of systems was obtained and all systems are negative except as noted in the HPI and PMH.   Patient's Health History    Past Medical History:  Diagnosis Date   Anxiety    Arthritis    right knee   Back pain    occasionally   Bipolar disorder ([place])    denies   COPD (chronic obstructive pulmonary disease) (HCC)    Depression    Diabetes mellitus without complication (HCC)    Generalized headaches    history of migraines-last one 02/02/14   GERD (gastroesophageal reflux disease)    takes Prilosec daily as needed   Headache    Hemorrhoids    History of bronchitis    1 month ago and treated with Amoxicillin and given an inhaler;takes Prenisone   Hyperlipidemia    takes Zetia daily   Hypertension    takes HCTZ daily   Joint pain    Pneumonia    hx of 2 yrs ago   Polysubstance abuse ([place])    Schizoaffective disorder ([place])    denies   Tuberculosis    treated for 1 year   Umbilical hernia     Past Surgical History:  Procedure Laterality Date   ESOPHAGOGASTRODUODENOSCOPY     JOINT REPLACEMENT     KNEE ARTHROSCOPY WITH MEDIAL MENISECTOMY Right 03/01/2014   Procedure: KNEE ARTHROSCOPY WITH  MEDIAL MENISECTOMY;  Surgeon: Meredith Pel, MD;  Location: [place];  Service: Orthopedics;  Laterality: Right;   LIPOMA EXCISION  ~ 2010   "back left side of my head"   PARTIAL KNEE ARTHROPLASTY Right 01/24/2015   Procedure: RIGHT KNEE MEDIAL COMPARTMENT REPLACEMENT VS TOTAL KNEE ARTHROPLASTY.;  Surgeon: Meredith Pel, MD;  Location: [place];  Service: Orthopedics;  Laterality: Right;  RIGHT KNEE MEDIAL COMPARTMENT REPLACEMENT VS TOTAL KNEE ARTHROPLASTY.   TOTAL KNEE ARTHROPLASTY Right     Family History  Problem Relation Age of Onset   Hypertension Mother    Cancer Mother    Heart disease Father    Heart attack Father 32   Hyperlipidemia Father    Hypertension Father    Diabetes Sister    Diabetes Brother     Social History   Socioeconomic History   Marital status: Single    Spouse name: Not on file   Number of children: Not on file   Years of education: Not on file   Highest education level: Not on file  Occupational History   Occupation: lifts and pulls concrete    Employer: [employer]   Tobacco Use   Smoking status: Every Day  Packs/day: 0.25    Years: 30.00    Pack years: 7.50    Types: Cigarettes   Smokeless tobacco: Never   Tobacco comments:    3 cigarettes per day (after meals)  Vaping Use   Vaping Use: Never used  Substance and Sexual Activity   Alcohol use: No    Comment: occasionally    Drug use: Yes    Types: Marijuana, Cocaine    Comment: cocaine, night before last   Sexual activity: Yes  Other Topics Concern   Not on file  Social History Narrative   ** Merged History Encounter **       Lives with girlfriend. Work involves a lot of pulling and lifting concrete, was not definite about type of work. Denies use of illegal drugs. Smokes half pack of cigarettes daily.   Social Determinants of Health   Financial Resource Strain: Not on file  Food Insecurity: Not on file  Transportation Needs: Not on file  Physical Activity: Not on file   Stress: Not on file  Social Connections: Not on file  Intimate Partner Violence: Not on file     Physical Exam   Vitals:   04/16/21 0445 04/16/21 0506  BP: 115/79 121/69  Pulse: 75 63  Resp:  15  Temp:    SpO2: 100% 100%    CONSTITUTIONAL: Well-appearing, NAD NEURO:  Alert and oriented x 3, no focal deficits EYES:  eyes equal and reactive ENT/NECK:  no LAD, no JVD CARDIO: Regular rate, well-perfused, normal S1 and S2 PULM:  CTAB no wheezing or rhonchi GI/GU:  normal bowel sounds, non-distended, non-tender MSK/SPINE tender to palpation to the left knee, left hip, left lateral ribs SKIN:  no rash, atraumatic PSYCH:  Appropriate speech and behavior  *Additional and/or pertinent findings included in MDM below  Diagnostic and Interventional Summary    EKG Interpretation  Date/Time:    Ventricular Rate:    PR Interval:    QRS Duration:   QT Interval:    QTC Calculation:   R Axis:     Text Interpretation:          Labs Reviewed  CBC  BASIC METABOLIC PANEL  ETHANOL    CT HEAD WO CONTRAST  Final Result    DG Chest 2 View  Final Result    DG Hip Unilat W or Wo Pelvis 2-3 Views Left  Final Result    DG Knee Complete 4 Views Left  Final Result      Medications  naproxen (NAPROSYN) tablet 500 mg (500 mg Oral Not Given 04/16/21 0445)     Procedures  /  Critical Care Procedures  ED Course and Medical Decision Making  I have reviewed the triage vital signs, the nursing notes, and pertinent available records from the EMR.  Listed above are laboratory and imaging tests that I personally ordered, reviewed, and interpreted and then considered in my medical decision making (see below for details).  Suspect alcohol intoxication with fall, however patient denies drug or alcohol use, thinks he blacked out.  Will obtain trauma imaging in the form of x-rays and CT head, will also initiate syncope work-up with basic labs, EKG.     Patient refusing labs, becoming  verbally abusive towards staff.  Imaging is reassuring.  EKG is without concerning features.  Nothing to suggest emergent process at this time, appropriate for discharge.  Barth Kirks. Sedonia Small, MD [place] [email or web address]  Final Clinical Impressions(s) / ED Diagnoses  ICD-10-CM   1. Fall  W19.[email or web address] Abbe DG Chest 2 View    DG Chest 2 View      ED Discharge Orders     None        Discharge Instructions Discussed with and Provided to Patient:     Discharge Instructions      You were evaluated in the Emergency Department and after careful evaluation, we did not find any emergent condition requiring admission or further testing in the hospital.  Your exam/testing today was overall reassuring.  X-rays and CT scan did not show any significant injuries.  Recommend Tylenol or ibuprofen for discomfort.  Please return to the Emergency Department if you experience any worsening of your condition.  Thank you for allowing [place] to be a part of your care.         Maudie Flakes, MD 04/16/21 [phone]

## 2021-04-16 NOTE — Discharge Instructions (Addendum)
You were evaluated in the Emergency Department and after careful evaluation, we did not find any emergent condition requiring admission or further testing in the hospital.  Your exam/testing today was overall reassuring.  X-rays and CT scan did not show any significant injuries.  Recommend Tylenol or ibuprofen for discomfort.  Please return to the Emergency Department if you experience any worsening of your condition.  Thank you for allowing [place] to be a part of your care.

## 2021-04-16 NOTE — ED Triage Notes (Signed)
Pt BIB EMS from [facility]. Pt complains of fall that caused left hip and left shoulder pain. Pt does not remember what time he fell. Pt states that he hit his head  102/60 blood pressure 95% room air  68 heart rate  154 CBG

## 2021-04-16 NOTE — ED Notes (Signed)
Pt refused pain medication

## 2021-04-16 NOTE — ED Notes (Signed)
Pt refused blood work  

## 2021-04-16 NOTE — ED Notes (Signed)
Pt refused discharge vitals, refused to sign for discharge paperwork. Would not speak to staff, though was witnessed speaking to MD earlier. Escorted off the premises by security.

## 2021-04-25 ENCOUNTER — Other Ambulatory Visit: Payer: Self-pay

## 2021-04-25 ENCOUNTER — Emergency Department (HOSPITAL_COMMUNITY)
Admission: EM | Admit: 2021-04-25 | Discharge: 2021-04-25 | Disposition: A | Discharge status: Home / Self Care | Payer: Medicare Other | Attending: Emergency Medicine | Admitting: Emergency Medicine

## 2021-04-25 ENCOUNTER — Encounter (HOSPITAL_COMMUNITY): Payer: Self-pay | Admitting: Emergency Medicine

## 2021-04-25 ENCOUNTER — Emergency Department (HOSPITAL_COMMUNITY): Payer: Medicare Other

## 2021-04-25 DIAGNOSIS — Z96651 Presence of right artificial knee joint: Secondary | ICD-10-CM | POA: Insufficient documentation

## 2021-04-25 DIAGNOSIS — Z79899 Other long term (current) drug therapy: Secondary | ICD-10-CM | POA: Insufficient documentation

## 2021-04-25 DIAGNOSIS — Y900 Blood alcohol level of less than 20 mg/100 ml: Secondary | ICD-10-CM | POA: Diagnosis not present

## 2021-04-25 DIAGNOSIS — E119 Type 2 diabetes mellitus without complications: Secondary | ICD-10-CM | POA: Insufficient documentation

## 2021-04-25 DIAGNOSIS — F17210 Nicotine dependence, cigarettes, uncomplicated: Secondary | ICD-10-CM | POA: Diagnosis not present

## 2021-04-25 DIAGNOSIS — Z7984 Long term (current) use of oral hypoglycemic drugs: Secondary | ICD-10-CM | POA: Diagnosis not present

## 2021-04-25 DIAGNOSIS — I639 Cerebral infarction, unspecified: Secondary | ICD-10-CM | POA: Diagnosis not present

## 2021-04-25 DIAGNOSIS — J449 Chronic obstructive pulmonary disease, unspecified: Secondary | ICD-10-CM | POA: Diagnosis not present

## 2021-04-25 DIAGNOSIS — G43109 Migraine with aura, not intractable, without status migrainosus: Secondary | ICD-10-CM | POA: Diagnosis not present

## 2021-04-25 DIAGNOSIS — I10 Essential (primary) hypertension: Secondary | ICD-10-CM | POA: Diagnosis not present

## 2021-04-25 DIAGNOSIS — R2990 Unspecified symptoms and signs involving the nervous system: Secondary | ICD-10-CM

## 2021-04-25 DIAGNOSIS — R519 Headache, unspecified: Secondary | ICD-10-CM | POA: Diagnosis present

## 2021-04-25 LAB — URINALYSIS, ROUTINE W REFLEX MICROSCOPIC
Bilirubin Urine: NEGATIVE
Glucose, UA: NEGATIVE mg/dL
Hgb urine dipstick: NEGATIVE
Ketones, ur: NEGATIVE mg/dL
Leukocytes,Ua: NEGATIVE
Nitrite: NEGATIVE
Protein, ur: NEGATIVE mg/dL
Specific Gravity, Urine: 1.018 (ref 1.005–1.030)
pH: 8 (ref 5.0–8.0)

## 2021-04-25 LAB — RAPID URINE DRUG SCREEN, HOSP PERFORMED
Amphetamines: NOT DETECTED
Barbiturates: NOT DETECTED
Benzodiazepines: NOT DETECTED
Cocaine: POSITIVE — AB
Opiates: NOT DETECTED
Tetrahydrocannabinol: NOT DETECTED

## 2021-04-25 LAB — COMPREHENSIVE METABOLIC PANEL
ALT: 22 U/L (ref 0–44)
AST: 19 U/L (ref 15–41)
Albumin: 2.7 g/dL — ABNORMAL LOW (ref 3.5–5.0)
Alkaline Phosphatase: 64 U/L (ref 38–126)
Anion gap: 4 — ABNORMAL LOW (ref 5–15)
BUN: 13 mg/dL (ref 6–20)
CO2: 30 mmol/L (ref 22–32)
Calcium: 8.7 mg/dL — ABNORMAL LOW (ref 8.9–10.3)
Chloride: 106 mmol/L (ref 98–111)
Creatinine, Ser: 1.14 mg/dL (ref 0.61–1.24)
GFR, Estimated: >60 mL/min (ref >60)
Glucose, Bld: 101 mg/dL — ABNORMAL HIGH (ref 70–99)
Potassium: 4.1 mmol/L (ref 3.5–5.1)
Sodium: 140 mmol/L (ref 135–145)
Total Bilirubin: 0.4 mg/dL (ref 0.3–1.2)
Total Protein: 5.6 g/dL — ABNORMAL LOW (ref 6.5–8.1)

## 2021-04-25 LAB — CBC
HCT: 42.8 % (ref 39.0–52.0)
Hemoglobin: 13.9 g/dL (ref 13.0–17.0)
MCH: 31.6 pg (ref 26.0–34.0)
MCHC: 32.5 g/dL (ref 30.0–36.0)
MCV: 97.3 fL (ref 80.0–100.0)
Platelets: 389 10*3/uL (ref 150–400)
RBC: 4.4 MIL/uL (ref 4.22–5.81)
RDW: 13.2 % (ref 11.5–15.5)
WBC: 7.5 10*3/uL (ref 4.0–10.5)
nRBC: 0 % (ref 0.0–0.2)

## 2021-04-25 LAB — DIFFERENTIAL
Abs Immature Granulocytes: 0.04 10*3/uL (ref 0.00–0.07)
Basophils Absolute: 0 10*3/uL (ref 0.0–0.1)
Basophils Relative: 1 %
Eosinophils Absolute: 0.3 10*3/uL (ref 0.0–0.5)
Eosinophils Relative: 4 %
Immature Granulocytes: 1 %
Lymphocytes Relative: 18 %
Lymphs Abs: 1.4 10*3/uL (ref 0.7–4.0)
Monocytes Absolute: 0.5 10*3/uL (ref 0.1–1.0)
Monocytes Relative: 6 %
Neutro Abs: 5.2 10*3/uL (ref 1.7–7.7)
Neutrophils Relative %: 70 %

## 2021-04-25 LAB — ETHANOL: Alcohol, Ethyl (B): <10 mg/dL (ref <10)

## 2021-04-25 LAB — APTT: aPTT: 27 seconds (ref 24–36)

## 2021-04-25 LAB — PROTIME-INR
INR: 1 (ref 0.8–1.2)
Prothrombin Time: 13.2 seconds (ref 11.4–15.2)

## 2021-04-25 MED ORDER — KETOROLAC TROMETHAMINE 15 MG/ML IJ SOLN
15.0000 mg | Freq: Once | INTRAMUSCULAR | Status: AC
  Administered 2021-04-25: 15 mg via INTRAVENOUS
  Filled 2021-04-25: qty 1

## 2021-04-25 MED ORDER — ACETAMINOPHEN 500 MG PO TABS
1000.0000 mg | ORAL_TABLET | Freq: Once | ORAL | Status: AC
  Administered 2021-04-25: 1000 mg via ORAL
  Filled 2021-04-25: qty 2

## 2021-04-25 MED ORDER — LORAZEPAM 2 MG/ML IJ SOLN
1.0000 mg | Freq: Once | INTRAMUSCULAR | Status: AC
  Administered 2021-04-25: 1 mg via INTRAVENOUS
  Filled 2021-04-25: qty 1

## 2021-04-25 MED ORDER — METOCLOPRAMIDE HCL 5 MG/ML IJ SOLN
10.0000 mg | Freq: Once | INTRAMUSCULAR | Status: AC
  Administered 2021-04-25: 10 mg via INTRAVENOUS
  Filled 2021-04-25: qty 2

## 2021-04-25 MED ORDER — SODIUM CHLORIDE 0.9 % IV BOLUS
1000.0000 mL | Freq: Once | INTRAVENOUS | Status: AC
  Administered 2021-04-25: 1000 mL via INTRAVENOUS

## 2021-04-25 MED ORDER — DIPHENHYDRAMINE HCL 50 MG/ML IJ SOLN
25.0000 mg | Freq: Once | INTRAMUSCULAR | Status: AC
  Administered 2021-04-25: 25 mg via INTRAVENOUS
  Filled 2021-04-25: qty 1

## 2021-04-25 NOTE — ED Triage Notes (Signed)
Pt BIB [facility] from [facility], complaint of stroke symptoms that started last week, left sided weakness, slurred speech, intermittent headache. Pt endorses symptoms consistent but have not gotten worse.

## 2021-04-25 NOTE — ED Provider Notes (Signed)
Care assumed from Domenic Moras, PA-C, at shift change, please see their notes for full documentation of patient's complaint/HPI. Briefly, pt here with throbbing left sided headache with tingling sensation to left side of face, difficulty speaking, and numbness/weakness to left arm and left leg for the past week. Results so far show normal CT Head, MRI Brain and Neck. Being treated for a migraine headache. Received reglan, benadryl, tylenol, and ativan without much relief. Awaiting toradol and fluids. Plan is to reevaluate and discharge accordingly.   Physical Exam  BP (!) 136/91   Pulse 69   Temp 98 F (36.7 C) (Oral)   Resp 19   Ht 5\' 7"  (1.702 m)   Wt 93 kg   SpO2 100%   BMI 32.11 kg/m   Physical Exam Vitals and nursing note reviewed.  Constitutional:      Appearance: He is not ill-appearing.  HENT:     Head: Normocephalic and atraumatic.  Eyes:     Conjunctiva/sclera: Conjunctivae normal.  Cardiovascular:     Rate and Rhythm: Normal rate and regular rhythm.  Pulmonary:     Effort: Pulmonary effort is normal.     Breath sounds: Normal breath sounds.  Skin:    General: Skin is warm and dry.     Coloration: Skin is not jaundiced.  Neurological:     Mental Status: He is alert.    ED Course/Procedures     Procedures  Results for orders placed or performed during the hospital encounter of 04/25/21  Ethanol  Result Value Ref Range   Alcohol, Ethyl (B) <10 <10 mg/dL  Protime-INR  Result Value Ref Range   Prothrombin Time 13.2 11.4 - 15.2 seconds   INR 1.0 0.8 - 1.2  APTT  Result Value Ref Range   aPTT 27 24 - 36 seconds  CBC  Result Value Ref Range   WBC 7.5 4.0 - 10.5 K/uL   RBC 4.40 4.22 - 5.81 MIL/uL   Hemoglobin 13.9 13.0 - 17.0 g/dL   HCT 42.8 39.0 - 52.0 %   MCV 97.3 80.0 - 100.0 fL   MCH 31.6 26.0 - 34.0 pg   MCHC 32.5 30.0 - 36.0 g/dL   RDW 13.2 11.5 - 15.5 %   Platelets 389 150 - 400 K/uL   nRBC 0.0 0.0 - 0.2 %  Differential  Result Value Ref Range    Neutrophils Relative % 70 %   Neutro Abs 5.2 1.7 - 7.7 K/uL   Lymphocytes Relative 18 %   Lymphs Abs 1.4 0.7 - 4.0 K/uL   Monocytes Relative 6 %   Monocytes Absolute 0.5 0.1 - 1.0 K/uL   Eosinophils Relative 4 %   Eosinophils Absolute 0.3 0.0 - 0.5 K/uL   Basophils Relative 1 %   Basophils Absolute 0.0 0.0 - 0.1 K/uL   Immature Granulocytes 1 %   Abs Immature Granulocytes 0.04 0.00 - 0.07 K/uL  Comprehensive metabolic panel  Result Value Ref Range   Sodium 140 135 - 145 mmol/L   Potassium 4.1 3.5 - 5.1 mmol/L   Chloride 106 98 - 111 mmol/L   CO2 30 22 - 32 mmol/L   Glucose, Bld 101 (H) 70 - 99 mg/dL   BUN 13 6 - 20 mg/dL   Creatinine, Ser 1.14 0.61 - 1.24 mg/dL   Calcium 8.7 (L) 8.9 - 10.3 mg/dL   Total Protein 5.6 (L) 6.5 - 8.1 g/dL   Albumin 2.7 (L) 3.5 - 5.0 g/dL   AST 19 15 -  41 U/L   ALT 22 0 - 44 U/L   Alkaline Phosphatase 64 38 - 126 U/L   Total Bilirubin 0.4 0.3 - 1.2 mg/dL   GFR, Estimated >60 >60 mL/min   Anion gap 4 (L) 5 - 15  Urine rapid drug screen (hosp performed)  Result Value Ref Range   Opiates NONE DETECTED NONE DETECTED   Cocaine POSITIVE (A) NONE DETECTED   Benzodiazepines NONE DETECTED NONE DETECTED   Amphetamines NONE DETECTED NONE DETECTED   Tetrahydrocannabinol NONE DETECTED NONE DETECTED   Barbiturates NONE DETECTED NONE DETECTED  Urinalysis, Routine w reflex microscopic Urine, Clean Catch  Result Value Ref Range   Color, Urine YELLOW YELLOW   APPearance CLEAR CLEAR   Specific Gravity, Urine 1.018 1.005 - 1.030   pH 8.0 5.0 - 8.0   Glucose, UA NEGATIVE NEGATIVE mg/dL   Hgb urine dipstick NEGATIVE NEGATIVE   Bilirubin Urine NEGATIVE NEGATIVE   Ketones, ur NEGATIVE NEGATIVE mg/dL   Protein, ur NEGATIVE NEGATIVE mg/dL   Nitrite NEGATIVE NEGATIVE   Leukocytes,Ua NEGATIVE NEGATIVE    CT Head Wo Contrast  Result Date: 04/25/2021 CLINICAL DATA:  Acute neuro deficit, stroke suspected EXAM: CT HEAD WITHOUT CONTRAST TECHNIQUE: Contiguous axial  images were obtained from the base of the skull through the vertex without intravenous contrast. COMPARISON:  04/16/2021 FINDINGS: Brain: No evidence of acute infarction, hemorrhage, hydrocephalus, extra-axial collection or mass lesion/mass effect. Vascular: No hyperdense vessel or unexpected calcification. Skull: Normal. Negative for fracture or focal lesion. Sinuses/Orbits: No acute finding. Improved aeration of the left maxillary sinus. Other: None IMPRESSION: Negative Electronically Signed   By: Lucrezia Europe M.D.   On: 04/25/2021 13:34   MR BRAIN WO CONTRAST  Result Date: 04/25/2021 CLINICAL DATA:  Neurological deficit.  Acute stroke suspected. EXAM: MRI HEAD WITHOUT CONTRAST TECHNIQUE: Multiplanar, multiecho pulse sequences of the brain and surrounding structures were obtained without intravenous contrast. COMPARISON:  Head CT same day FINDINGS: Brain: Diffusion imaging does not show any acute or subacute infarction. Mild chronic small-vessel ischemic change affects the pons. No focal cerebellar abnormality. Cerebral hemispheres show chronic small-vessel ischemic change of the deep and subcortical white matter, moderate in degree. No cortical or large vessel territory infarction. No mass lesion, hemorrhage, hydrocephalus or extra-axial collection. Vascular: Major vessels at the base of the brain show flow. Skull and upper cervical spine: Negative Sinuses/Orbits: Clear/normal Other: None IMPRESSION: No acute finding. Chronic small-vessel ischemic changes of the pons and cerebral hemispheric white matter, advanced for age. Electronically Signed   By: Nelson Chimes M.D.   On: 04/25/2021 14:39   MR Cervical Spine Wo Contrast  Result Date: 04/25/2021 CLINICAL DATA:  Motor neuron disease. Left-sided weakness, slurred speech and intermittent headache. EXAM: MRI CERVICAL SPINE WITHOUT CONTRAST TECHNIQUE: Multiplanar, multisequence MR imaging of the cervical spine was performed. No intravenous contrast was  administered. COMPARISON:  None. FINDINGS: Alignment: No malalignment. Vertebrae: Somewhat heterogeneous marrow pattern, probably within normal limits. No evidence of fracture. Cord: No focal cord lesion.  See below regarding stenosis. Posterior Fossa, vertebral arteries, paraspinal tissues: Chronic small-vessel ischemic changes of the pons as described at brain MRI. Disc levels: Foramen magnum is widely patent. C1-2 is normal. Mild bulging of the disc towards the left at C2-3 but no stenosis. C3-4: Small central disc protrusion that indents the ventral subarachnoid space but does not compress the cord. AP diameter of the canal 9.2 mm. Mild bilateral foraminal narrowing. C4-5: Mild bulging of the disc. No compressive narrowing of  the canal. Foramina widely patent. C5-6: Mild bulging of the disc. No compressive narrowing of the canal. Minimal bony foraminal narrowing on the right, not compressive C6-7: Mild bulging of the disc. No compressive narrowing of the canal or foramina. C7-T1: Minimal bulging of the disc.  No stenosis. IMPRESSION: No evidence of cord lesion. Mild spondylosis and disc bulges as described above. No apparent compressive stenosis of the central canal. No advanced foraminal narrowing. Mild foraminal narrowing at C3-4 and on the right at C5-6. Electronically Signed   By: Nelson Chimes M.D.   On: 04/25/2021 14:46    MDM  On reevaluation after toradol and IV fluids patient reports improvement in symptoms. Still has mild headache however significantly improved from previous. He is requesting something to eat at this time. He does admit to being homeless. Will provide food and discharge at this time. Pt was slightly drowsy after benadryl however has been able to ambulate safely without assistance.   This note was prepared using Dragon voice recognition software and may include unintentional dictation errors due to the inherent limitations of voice recognition software.        [name], PA-C 04/25/21 1819    Lucrezia Starch, MD 04/26/21 [phone]

## 2021-04-25 NOTE — ED Notes (Signed)
Patient transported to CT then MRI 

## 2021-04-25 NOTE — Discharge Instructions (Addendum)
Please follow up with [facility] if you do not have a primary care provider. You can also follow up with [facility] for your complex migraine today.   Return to the ED for any new/worsening symptoms

## 2021-04-25 NOTE — ED Notes (Signed)
Patient ambulated in room and to bathroom independently.

## 2021-04-25 NOTE — ED Provider Notes (Signed)
[facility] EMERGENCY DEPARTMENT Provider Note   CSN: [id number] Arrival date & time: 04/25/21  1131     History Chief Complaint  Patient presents with   Stroke Symptoms    Andrew [name] is a 55 y.o. male.  The history is provided by the patient and medical records. No language interpreter was used.   55 year old male significant history of bipolar, diabetes, COPD, recurrent headache, hypertension, schizoaffective disorder, polysubstance abuse who sent here from [facility] with strokelike symptoms.  Patient report for the past week he has had migraine-like headache which he described more as a throbbing sensation to the left side of his head with light and sound sensitivity.  Furthermore he also endorsed tingling sensation to the left side of his face, difficulty speaking, as well as having numbness and weakness to his left arm and left leg.  States that he has to think before he can speak and have to concentrate to use his upper and lower extremity.  Symptoms moderate in severity without any improvement.  Denies any prior stroke.  Denies having fever chills runny nose sneezing coughing trouble breathing chest pain shortness of breath abdominal pain back pain.  Denies IV drug use but admits to tobacco use, cocaine use, and occasional alcohol use.  Last cocaine use 3 days ago.  Past Medical History:  Diagnosis Date   Anxiety    Arthritis    right knee   Back pain    occasionally   Bipolar disorder ([place])    denies   COPD (chronic obstructive pulmonary disease) (HCC)    Depression    Diabetes mellitus without complication (HCC)    Generalized headaches    history of migraines-last one 02/02/14   GERD (gastroesophageal reflux disease)    takes Prilosec daily as needed   Headache    Hemorrhoids    History of bronchitis    1 month ago and treated with Amoxicillin and given an inhaler;takes Prenisone   Hyperlipidemia    takes Zetia daily   Hypertension    takes HCTZ  daily   Joint pain    Pneumonia    hx of 2 yrs ago   Polysubstance abuse ([place])    Schizoaffective disorder ([place])    denies   Tuberculosis    treated for 1 year   Umbilical hernia     Patient Active Problem List   Diagnosis Date Noted   Drug overdose 01/08/2021   Type 2 diabetes mellitus with complication, without long-term current use of insulin ([place]) 05/25/2020   Educated about COVID-19 virus infection 02/23/2020   Crack cocaine overdose ([name]) 09/28/2017   Hypoglycemia 09/28/2017   Hepatitis 09/28/2017   Leucocytosis 09/28/2017   Overdose 09/28/2017   Arthritis of knee, degenerative 01/24/2015   Right knee pain 01/07/2014   Concern about STD in male without diagnosis 01/06/2014   Dysuria 12/09/2013   High cholesterol 10/25/2013   GERD (gastroesophageal reflux disease) 09/20/2013   History of positive PPD 07/17/2012   Cocaine abuse ([place]) 04/02/2012   Rhabdomyolysis 04/02/2012   Hernia of abdominal wall 01/05/2011   ERECTILE DYSFUNCTION, PSYCHOGENIC 02/13/2010   BIPOLAR DISORDER UNSPECIFIED 01/31/2009   TOBACCO ABUSE 01/31/2009   ELEVATED BP READING WITHOUT DX HYPERTENSION 01/31/2009    Past Surgical History:  Procedure Laterality Date   ESOPHAGOGASTRODUODENOSCOPY     JOINT REPLACEMENT     KNEE ARTHROSCOPY WITH MEDIAL MENISECTOMY Right 03/01/2014   Procedure: KNEE ARTHROSCOPY WITH MEDIAL MENISECTOMY;  Surgeon: Meredith Pel, MD;  Location: [facility];  Service: Orthopedics;  Laterality: Right;   LIPOMA EXCISION  ~ 2010   "back left side of my head"   PARTIAL KNEE ARTHROPLASTY Right 01/24/2015   Procedure: RIGHT KNEE MEDIAL COMPARTMENT REPLACEMENT VS TOTAL KNEE ARTHROPLASTY.;  Surgeon: Meredith Pel, MD;  Location: [place];  Service: Orthopedics;  Laterality: Right;  RIGHT KNEE MEDIAL COMPARTMENT REPLACEMENT VS TOTAL KNEE ARTHROPLASTY.   TOTAL KNEE ARTHROPLASTY Right        Family History  Problem Relation Age of Onset   Hypertension Mother    Cancer Mother     Heart disease Father    Heart attack Father 28   Hyperlipidemia Father    Hypertension Father    Diabetes Sister    Diabetes Brother     Social History   Tobacco Use   Smoking status: Every Day    Packs/day: 0.25    Years: 30.00    Pack years: 7.50    Types: Cigarettes   Smokeless tobacco: Never   Tobacco comments:    3 cigarettes per day (after meals)  Vaping Use   Vaping Use: Never used  Substance Use Topics   Alcohol use: No    Comment: occasionally    Drug use: Yes    Types: Marijuana, Cocaine    Comment: cocaine, night before last    Home Medications Prior to Admission medications   Medication Sig Start Date End Date Taking? Authorizing Provider  Accu-Chek Softclix Lancets lancets Check sugar twice a day.  Dx Code: E11.9 02/04/20   Saguier, Percell Miller, PA-C  colchicine 0.6 MG tablet Take 1 tablet (0.6 mg total) by mouth 2 (two) times daily. Patient not taking: Reported on 01/09/2021 09/11/20   Robinson, [place] N, PA-C  COVID-19 mRNA vaccine, Moderna, 100 MCG/0.5ML injection AS DIRECTED 07/07/20 07/07/21  Carlyle Basques, MD  glucose blood (ACCU-CHEK GUIDE) test strip Check sugar twice a day.  Dx Code: E11.9 02/04/20   Saguier, Percell Miller, PA-C  glucose monitoring kit (FREESTYLE) monitoring kit 1 each by Does not apply route as needed for other. 12/24/19   Saguier, Percell Miller, PA-C  metFORMIN (GLUCOPHAGE) 500 MG tablet Take 1 tablet (500 mg total) by mouth 2 (two) times daily with a meal. 01/11/21 02/10/21  Donne Hazel, MD  OneTouch Delica Lancets 44W MISC USE TO CHECK BLOOD SUGAR 3 TIMES DAILY. DX E11.9 02/04/20   Saguier, Percell Miller, PA-C    Allergies    Mushroom extract complex, Penicillins, Other, and Penicillins  Review of Systems   Review of Systems  All other systems reviewed and are negative.  Physical Exam Updated Vital Signs BP (!) 134/100 (BP Location: Right Arm)   Pulse 68   Temp 98 F (36.7 C) (Oral)   Resp 18   Ht _0  (1.702 m)   Wt 93 kg   SpO2 100%   BMI 32.11  kg/m   Physical Exam Vitals and nursing note reviewed.  Constitutional:      General: He is not in acute distress.    Appearance: He is well-developed.  HENT:     Head: Atraumatic.  Eyes:     Conjunctiva/sclera: Conjunctivae normal.  Cardiovascular:     Rate and Rhythm: Normal rate and regular rhythm.     Pulses: Normal pulses.     Heart sounds: Normal heart sounds.  Pulmonary:     Effort: Pulmonary effort is normal.     Breath sounds: Normal breath sounds. No wheezing.  Abdominal:     Palpations:  Abdomen is soft.     Tenderness: There is no abdominal tenderness.  Musculoskeletal:     Cervical back: Neck supple.  Skin:    Findings: No rash.  Neurological:     Mental Status: He is alert.     Comments: Neurologic exam:  Speech slow but clear, pupils equal round reactive to light, extraocular movements intact  Normal peripheral visual fields Cranial nerves III through XII normal including no facial droop Follows commands, moves all extremities x4, decrease sensation and strength to LUE and LLE Sensation diminished to L side of face and L arm and L leg Coordination poor with left pronator drift Gait not tested   Psychiatric:        Mood and Affect: Mood normal.    ED Results / Procedures / Treatments   Labs (all labs ordered are listed, but only abnormal results are displayed) Labs Reviewed  COMPREHENSIVE METABOLIC PANEL - Abnormal; Notable for the following components:      Result Value   Glucose, Bld 101 (*)    Calcium 8.7 (*)    Total Protein 5.6 (*)    Albumin 2.7 (*)    Anion gap 4 (*)    All other components within normal limits  RAPID URINE DRUG SCREEN, HOSP PERFORMED - Abnormal; Notable for the following components:   Cocaine POSITIVE (*)    All other components within normal limits  ETHANOL  PROTIME-INR  APTT  CBC  DIFFERENTIAL  URINALYSIS, ROUTINE W REFLEX MICROSCOPIC    EKG None  Date: 04/25/2021  Rate: 64  Rhythm: normal sinus rhythm  QRS  Axis: normal  Intervals: normal  ST/T Wave abnormalities: normal  Conduction Disutrbances: none  Narrative Interpretation: minimal ST elevation in the anterior leads  Old EKG Reviewed: No significant changes noted    Radiology CT Head Wo Contrast  Result Date: 04/25/2021 CLINICAL DATA:  Acute neuro deficit, stroke suspected EXAM: CT HEAD WITHOUT CONTRAST TECHNIQUE: Contiguous axial images were obtained from the base of the skull through the vertex without intravenous contrast. COMPARISON:  04/16/2021 FINDINGS: Brain: No evidence of acute infarction, hemorrhage, hydrocephalus, extra-axial collection or mass lesion/mass effect. Vascular: No hyperdense vessel or unexpected calcification. Skull: Normal. Negative for fracture or focal lesion. Sinuses/Orbits: No acute finding. Improved aeration of the left maxillary sinus. Other: None IMPRESSION: Negative Electronically Signed   By: Lucrezia Europe M.D.   On: 04/25/2021 13:34   MR BRAIN WO CONTRAST  Result Date: 04/25/2021 CLINICAL DATA:  Neurological deficit.  Acute stroke suspected. EXAM: MRI HEAD WITHOUT CONTRAST TECHNIQUE: Multiplanar, multiecho pulse sequences of the brain and surrounding structures were obtained without intravenous contrast. COMPARISON:  Head CT same day FINDINGS: Brain: Diffusion imaging does not show any acute or subacute infarction. Mild chronic small-vessel ischemic change affects the pons. No focal cerebellar abnormality. Cerebral hemispheres show chronic small-vessel ischemic change of the deep and subcortical white matter, moderate in degree. No cortical or large vessel territory infarction. No mass lesion, hemorrhage, hydrocephalus or extra-axial collection. Vascular: Major vessels at the base of the brain show flow. Skull and upper cervical spine: Negative Sinuses/Orbits: Clear/normal Other: None IMPRESSION: No acute finding. Chronic small-vessel ischemic changes of the pons and cerebral hemispheric white matter, advanced for  age. Electronically Signed   By: Nelson Chimes M.D.   On: 04/25/2021 14:39   MR Cervical Spine Wo Contrast  Result Date: 04/25/2021 CLINICAL DATA:  Motor neuron disease. Left-sided weakness, slurred speech and intermittent headache. EXAM: MRI CERVICAL SPINE WITHOUT CONTRAST  TECHNIQUE: Multiplanar, multisequence MR imaging of the cervical spine was performed. No intravenous contrast was administered. COMPARISON:  None. FINDINGS: Alignment: No malalignment. Vertebrae: Somewhat heterogeneous marrow pattern, probably within normal limits. No evidence of fracture. Cord: No focal cord lesion.  See below regarding stenosis. Posterior Fossa, vertebral arteries, paraspinal tissues: Chronic small-vessel ischemic changes of the pons as described at brain MRI. Disc levels: Foramen magnum is widely patent. C1-2 is normal. Mild bulging of the disc towards the left at C2-3 but no stenosis. C3-4: Small central disc protrusion that indents the ventral subarachnoid space but does not compress the cord. AP diameter of the canal 9.2 mm. Mild bilateral foraminal narrowing. C4-5: Mild bulging of the disc. No compressive narrowing of the canal. Foramina widely patent. C5-6: Mild bulging of the disc. No compressive narrowing of the canal. Minimal bony foraminal narrowing on the right, not compressive C6-7: Mild bulging of the disc. No compressive narrowing of the canal or foramina. C7-T1: Minimal bulging of the disc.  No stenosis. IMPRESSION: No evidence of cord lesion. Mild spondylosis and disc bulges as described above. No apparent compressive stenosis of the central canal. No advanced foraminal narrowing. Mild foraminal narrowing at C3-4 and on the right at C5-6. Electronically Signed   By: Nelson Chimes M.D.   On: 04/25/2021 14:46    Procedures Procedures   Medications Ordered in ED Medications  sodium chloride 0.9 % bolus 1,000 mL (has no administration in time range)  ketorolac (TORADOL) 15 MG/ML injection 15 mg (has no  administration in time range)  metoCLOPramide (REGLAN) injection 10 mg (10 mg Intravenous Given 04/25/21 1220)  diphenhydrAMINE (BENADRYL) injection 25 mg (25 mg Intravenous Given 04/25/21 1219)  acetaminophen (TYLENOL) tablet 1,000 mg (1,000 mg Oral Given 04/25/21 1304)  LORazepam (ATIVAN) injection 1 mg (1 mg Intravenous Given 04/25/21 1350)    ED Course  I have reviewed the triage vital signs and the nursing notes.  Pertinent labs & imaging results that were available during my care of the patient were reviewed by me and considered in my medical decision making (see chart for details).    MDM Rules/Calculators/A&P                           BP (!) 134/96   Pulse 65   Temp 98 F (36.7 C) (Oral)   Resp 17   Ht _0  (1.702 m)   Wt 93 kg   SpO2 100%   BMI 32.11 kg/m   Final Clinical Impression(s) / ED Diagnoses Final diagnoses:  Stroke-like symptoms  Complicated migraine    Rx / DC Orders ED Discharge Orders     None      12:13 PM Patient with polysubstance abuse as well as bipolar here with complaint of headache and left-sided numbness and weakness for nearly a week.  Last known normal was perhaps a week ago.  He exhibits subjective paresthesias to left side of face left arm and left leg but he does not have any obvious facial droop.  Does endorse a headache without any fever or nuchal rigidity concerning for meningitis.  Given the duration of symptoms, it is unlikely to be subarachnoid hemorrhage causing his neurodeficit.  I suspect this is likely a complicated migraine however I will obtain head and cervical spine MRI rule out stroke.  I will provide migraine cocktail in the meantime.  1:48 PM Head CT scan unremarkable, alcohol is undetectable, labs are reassuring, urinalysis without  signs of urinary tract infection.  I have also consulted neurologist Dr. Katheran Awe who agrees that if brain MRI is negative then pt should be treated for complicated migraine with migraine  cocktail.  Will order toradol as pt have received benadryl and reglan as well as tylenol for his headache.    Pt sign out to oncoming team who will reassess pt after migraine cocktail and will determine disposition.      Domenic Moras, PA-C 04/25/21 [place], MD 04/26/21 [id number]

## 2021-05-15 ENCOUNTER — Other Ambulatory Visit: Payer: Self-pay

## 2021-05-15 ENCOUNTER — Ambulatory Visit (INDEPENDENT_AMBULATORY_CARE_PROVIDER_SITE_OTHER): Payer: Medicare Other | Admitting: Podiatry

## 2021-05-15 DIAGNOSIS — M2041 Other hammer toe(s) (acquired), right foot: Secondary | ICD-10-CM | POA: Diagnosis not present

## 2021-05-15 DIAGNOSIS — E1142 Type 2 diabetes mellitus with diabetic polyneuropathy: Secondary | ICD-10-CM | POA: Diagnosis not present

## 2021-05-15 DIAGNOSIS — M2012 Hallux valgus (acquired), left foot: Secondary | ICD-10-CM

## 2021-05-15 DIAGNOSIS — E1169 Type 2 diabetes mellitus with other specified complication: Secondary | ICD-10-CM

## 2021-05-15 DIAGNOSIS — B351 Tinea unguium: Secondary | ICD-10-CM

## 2021-05-15 DIAGNOSIS — M2042 Other hammer toe(s) (acquired), left foot: Secondary | ICD-10-CM

## 2021-05-15 DIAGNOSIS — M2011 Hallux valgus (acquired), right foot: Secondary | ICD-10-CM

## 2021-05-15 NOTE — Progress Notes (Signed)
  Subjective:  Patient ID: Andrew [name], male    DOB: 12-07-1965,  MRN: [id number]  Chief Complaint  Patient presents with   Nail Problem    Routine foot care / callus removal    55 y.o. male presents with the above complaint. History confirmed with patient. Reports severe numbness to his feet. Unaware of last A1c. Reports recent stroke as well.  Objective:  Physical Exam: warm, good capillary refill, nail exam onychomycosis of the toenails, no trophic changes or ulcerative lesions. DP pulses palpable, PT pulses palpable, and protective sensation absent Left Foot: bunion deformity noted and hammertoes present   Right Foot: bunion deformity noted, hammertoes present   No images are attached to the encounter.  Assessment:   1. Onychomycosis of multiple toenails with type 2 diabetes mellitus and peripheral neuropathy ([place])   2. Acquired hallux valgus of both feet   3. Hammer toes of both feet    Plan:  Patient was evaluated and treated and all questions answered.  Onychomycosis, Diabetes, and DPN -Patient is diabetic with a qualifying condition for at risk foot care. -Would benefit from DM shoes. Will make appt for fabrication -Educated on DM Footcare. -Discussed lotion for thick dry skin/calluses.  Procedure: Nail Debridement Type of Debridement: manual, sharp debridement. Instrumentation: Nail nipper, rotary burr. Number of Nails: 10  Return if symptoms worsen or fail to improve.

## 2021-05-15 NOTE — Patient Instructions (Signed)

## 2021-05-16 ENCOUNTER — Telehealth: Payer: Self-pay | Admitting: Podiatry

## 2021-05-16 NOTE — Telephone Encounter (Signed)
Noted thanks °

## 2021-05-16 NOTE — Telephone Encounter (Signed)
Pt called stating he received an prescription for diabetic shoes to go to [facility] and they are no longer taking pts for diabetic shoes. He said they referred him somewhere and he called and they are not doing any diabetic shoes either.  I told pt with his insurance we can do them but they do not cover at 100% only 80 and he would have to pay the 20% since medicaid does not cover them in our office. He is wanting to get in asap because he is to be starting a job.  I apologized but currently we do not have any openings for diabetic shoe measurements but I would put him on the list to call when we get openings.

## 2021-05-21 ENCOUNTER — Other Ambulatory Visit: Payer: Self-pay

## 2021-05-21 ENCOUNTER — Ambulatory Visit (INDEPENDENT_AMBULATORY_CARE_PROVIDER_SITE_OTHER): Payer: Medicare Other

## 2021-05-21 DIAGNOSIS — E1169 Type 2 diabetes mellitus with other specified complication: Secondary | ICD-10-CM

## 2021-05-21 DIAGNOSIS — E1142 Type 2 diabetes mellitus with diabetic polyneuropathy: Secondary | ICD-10-CM

## 2021-05-21 DIAGNOSIS — B351 Tinea unguium: Secondary | ICD-10-CM

## 2021-05-21 NOTE — Progress Notes (Signed)
Patient in office today for diabetic shoe measurement and custom inserts. Patient measured at a size 10 wide in men's shoes today. Raelyn Number, PA treats patient's diabetes at this time but doesn't know who the leading provider is over [place]. Patient selected shoe [id number] Apex Lace Walker. For a backup pair patient select Apex Ariya hiking boot.

## 2021-06-04 ENCOUNTER — Other Ambulatory Visit: Payer: Self-pay | Admitting: Student

## 2021-06-04 DIAGNOSIS — F17210 Nicotine dependence, cigarettes, uncomplicated: Secondary | ICD-10-CM

## 2021-06-04 DIAGNOSIS — F17200 Nicotine dependence, unspecified, uncomplicated: Secondary | ICD-10-CM

## 2021-07-04 ENCOUNTER — Telehealth: Payer: Self-pay | Admitting: Podiatry

## 2021-07-04 NOTE — Telephone Encounter (Signed)
Pt left message asking about his diabetic shoes he has been waiting forever.  I returned call after checking safestep and we just got the documents needed on 9.22 so the inserts are in production and should be shipping in the next week or so. I told pt I would call when they come in.

## 2021-07-21 ENCOUNTER — Emergency Department (HOSPITAL_COMMUNITY): Payer: Medicare Other

## 2021-07-21 ENCOUNTER — Other Ambulatory Visit: Payer: Self-pay

## 2021-07-21 ENCOUNTER — Encounter (HOSPITAL_COMMUNITY): Payer: Self-pay | Admitting: *Deleted

## 2021-07-21 ENCOUNTER — Emergency Department (HOSPITAL_COMMUNITY)
Admission: EM | Admit: 2021-07-21 | Discharge: 2021-07-22 | Disposition: A | Discharge status: Home / Self Care | Payer: Medicare Other | Attending: Emergency Medicine | Admitting: Emergency Medicine

## 2021-07-21 DIAGNOSIS — D72819 Decreased white blood cell count, unspecified: Secondary | ICD-10-CM | POA: Insufficient documentation

## 2021-07-21 DIAGNOSIS — F17210 Nicotine dependence, cigarettes, uncomplicated: Secondary | ICD-10-CM | POA: Insufficient documentation

## 2021-07-21 DIAGNOSIS — E119 Type 2 diabetes mellitus without complications: Secondary | ICD-10-CM | POA: Diagnosis not present

## 2021-07-21 DIAGNOSIS — J069 Acute upper respiratory infection, unspecified: Secondary | ICD-10-CM | POA: Insufficient documentation

## 2021-07-21 DIAGNOSIS — J449 Chronic obstructive pulmonary disease, unspecified: Secondary | ICD-10-CM | POA: Insufficient documentation

## 2021-07-21 DIAGNOSIS — I10 Essential (primary) hypertension: Secondary | ICD-10-CM | POA: Diagnosis not present

## 2021-07-21 DIAGNOSIS — R509 Fever, unspecified: Secondary | ICD-10-CM | POA: Diagnosis present

## 2021-07-21 DIAGNOSIS — Z20822 Contact with and (suspected) exposure to covid-19: Secondary | ICD-10-CM | POA: Diagnosis not present

## 2021-07-21 DIAGNOSIS — Z96651 Presence of right artificial knee joint: Secondary | ICD-10-CM | POA: Insufficient documentation

## 2021-07-21 DIAGNOSIS — N390 Urinary tract infection, site not specified: Secondary | ICD-10-CM | POA: Diagnosis not present

## 2021-07-21 LAB — URINALYSIS, ROUTINE W REFLEX MICROSCOPIC
Bacteria, UA: NONE SEEN
Bilirubin Urine: NEGATIVE
Glucose, UA: NEGATIVE mg/dL
Hgb urine dipstick: NEGATIVE
Ketones, ur: NEGATIVE mg/dL
Nitrite: NEGATIVE
Protein, ur: NEGATIVE mg/dL
Specific Gravity, Urine: 1.024 (ref 1.005–1.030)
pH: 5 (ref 5.0–8.0)

## 2021-07-21 LAB — CBC WITH DIFFERENTIAL/PLATELET
Abs Immature Granulocytes: 0.02 10*3/uL (ref 0.00–0.07)
Basophils Absolute: 0 10*3/uL (ref 0.0–0.1)
Basophils Relative: 1 %
Eosinophils Absolute: 0.3 10*3/uL (ref 0.0–0.5)
Eosinophils Relative: 4 %
HCT: 43.9 % (ref 39.0–52.0)
Hemoglobin: 14.9 g/dL (ref 13.0–17.0)
Immature Granulocytes: 0 %
Lymphocytes Relative: 17 %
Lymphs Abs: 1.4 10*3/uL (ref 0.7–4.0)
MCH: 31.6 pg (ref 26.0–34.0)
MCHC: 33.9 g/dL (ref 30.0–36.0)
MCV: 93 fL (ref 80.0–100.0)
Monocytes Absolute: 0.9 10*3/uL (ref 0.1–1.0)
Monocytes Relative: 11 %
Neutro Abs: 5.4 10*3/uL (ref 1.7–7.7)
Neutrophils Relative %: 67 %
Platelets: 262 10*3/uL (ref 150–400)
RBC: 4.72 MIL/uL (ref 4.22–5.81)
RDW: 13.1 % (ref 11.5–15.5)
WBC: 8 10*3/uL (ref 4.0–10.5)
nRBC: 0 % (ref 0.0–0.2)

## 2021-07-21 LAB — COMPREHENSIVE METABOLIC PANEL
ALT: 15 U/L (ref 0–44)
AST: 28 U/L (ref 15–41)
Albumin: 3.6 g/dL (ref 3.5–5.0)
Alkaline Phosphatase: 66 U/L (ref 38–126)
Anion gap: 9 (ref 5–15)
BUN: 10 mg/dL (ref 6–20)
CO2: 23 mmol/L (ref 22–32)
Calcium: 8.8 mg/dL — ABNORMAL LOW (ref 8.9–10.3)
Chloride: 102 mmol/L (ref 98–111)
Creatinine, Ser: 0.99 mg/dL (ref 0.61–1.24)
GFR, Estimated: >60 mL/min (ref >60)
Glucose, Bld: 113 mg/dL — ABNORMAL HIGH (ref 70–99)
Potassium: 3.7 mmol/L (ref 3.5–5.1)
Sodium: 134 mmol/L — ABNORMAL LOW (ref 135–145)
Total Bilirubin: 0.9 mg/dL (ref 0.3–1.2)
Total Protein: 6.9 g/dL (ref 6.5–8.1)

## 2021-07-21 LAB — RESP PANEL BY RT-PCR (FLU A&B, COVID) ARPGX2
Influenza A by PCR: NEGATIVE
Influenza B by PCR: NEGATIVE
SARS Coronavirus 2 by RT PCR: NEGATIVE

## 2021-07-21 LAB — LIPASE, BLOOD: Lipase: 37 U/L (ref 11–51)

## 2021-07-21 NOTE — ED Triage Notes (Signed)
Pt states fever, vomiting, sore throat, body aches, vomiting for several days.  States temp was up to 105 at home last night.

## 2021-07-21 NOTE — ED Provider Notes (Signed)
Emergency Medicine Provider Triage Evaluation Note  Andrew [name] , a 55 y.o. male  was evaluated in triage.  Pt complains of fever with tmax 105.0 yesterday. He also complains of cough, abd pain, nausea, vomiting. No diarrhea. Is vaccinated against COVID. Unknown sick contacts. .  Review of Systems  Positive: + fevers, chills, abd pain, n/v, cough Negative: - diarrhea  Physical Exam  BP 113/72 (BP Location: Right Arm)   Pulse 93   Temp 99.8 F (37.7 C) (Oral)   Resp 18   SpO2 97%  Gen:   Awake, no distress   Resp:  Normal effort  MSK:   Moves extremities without difficulty  Other:    Medical Decision Making  Medically screening exam initiated at 7:20 PM.  Appropriate orders placed.  [name] was informed that the remainder of the evaluation will be completed by another provider, this initial triage assessment does not replace that evaluation, and the importance of remaining in the ED until their evaluation is complete.     [name], PA-C 07/21/21 [name], MD 07/21/21 2044

## 2021-07-22 MED ORDER — IPRATROPIUM-ALBUTEROL 0.5-2.5 (3) MG/3ML IN SOLN: 3.0000 mL | RESPIRATORY_TRACT | Status: DC

## 2021-07-22 MED ORDER — CEPHALEXIN 500 MG PO CAPS: 500.0000 mg | ORAL_CAPSULE | Freq: Four times a day (QID) | ORAL | 0 refills | Status: DC

## 2021-07-22 MED ORDER — LIDOCAINE VISCOUS HCL 2 % MT SOLN
15.0000 mL | Freq: Once | OROMUCOSAL | Status: AC
  Administered 2021-07-22: 15 mL via ORAL
  Filled 2021-07-22: qty 15

## 2021-07-22 MED ORDER — ACETAMINOPHEN 500 MG PO TABS
1000.0000 mg | ORAL_TABLET | Freq: Once | ORAL | Status: AC
  Administered 2021-07-22: 1000 mg via ORAL
  Filled 2021-07-22: qty 2

## 2021-07-22 MED ORDER — LIDOCAINE HCL (PF) 1 % IJ SOLN
INTRAMUSCULAR | Status: AC
  Administered 2021-07-22: 5 mL
  Filled 2021-07-22: qty 5

## 2021-07-22 MED ORDER — DM-GUAIFENESIN ER 30-600 MG PO TB12: 1.0000 | ORAL_TABLET | Freq: Two times a day (BID) | ORAL | 0 refills | Status: DC

## 2021-07-22 MED ORDER — CEFTRIAXONE SODIUM 1 G IJ SOLR
1.0000 g | Freq: Once | INTRAMUSCULAR | Status: AC
  Administered 2021-07-22: 1 g via INTRAMUSCULAR
  Filled 2021-07-22: qty 10

## 2021-07-22 MED ORDER — ALUM & MAG HYDROXIDE-SIMETH 200-200-20 MG/5ML PO SUSP
30.0000 mL | Freq: Once | ORAL | Status: AC
  Administered 2021-07-22: 30 mL via ORAL
  Filled 2021-07-22: qty 30

## 2021-07-22 MED ORDER — SODIUM CHLORIDE 0.9 % IV SOLN: 1.0000 g | Freq: Once | INTRAVENOUS | Status: DC

## 2021-07-22 NOTE — ED Notes (Signed)
Discharge instructions reviewed w/ pt as well as prescriptions and follow-up. Pt verbalized understanding. VSS and ambulatory w/ steady gait.

## 2021-07-22 NOTE — ED Notes (Signed)
Pt exhibiting intimidating behaviors towards staff and trying to play it off as joking. Pt easily agitated.

## 2021-07-22 NOTE — Discharge Instructions (Addendum)
Your urine shows some signs of infection and since you have flank tenderness on exam we will treat you for urinary tract infection.  Please take antibiotics 4 times daily for the next 7 days.  Symptoms could also be related to a viral infection especially given your cough and chest congestion.  Your lungs sound clear.  You can use your home breathing treatments as needed.  You can take Mucinex DM to help with cough and chest congestion.  Monitor your symptoms closely and return for new or worsening symptoms.  Please follow-up with your primary care doctor.

## 2021-07-22 NOTE — ED Provider Notes (Signed)
[facility] EMERGENCY DEPARTMENT Provider Note   CSN: [id number] Arrival date & time: 07/21/21  1855     History Chief Complaint  Patient presents with   Fever    SANTHOSH GULINO is a 55 y.o. male.  EVIE CRUMPLER is a 55 y.o. male with a history of hypertension, hyperlipidemia, diabetes, polysubstance abuse, schizoaffective disorder, who presents to the ED for evaluation of fever.  Patient reports symptoms started 3 days ago when he started having cough and chest congestion as well as some burning epigastric discomfort, intermittent nausea and vomiting.  Denies any urinary symptoms but has had some flank pain.  Denies any diarrhea.  He reports 2 days ago he had a fever up to 105 reportedly, did not take anything to intervene on this reports he just ate some soup and fever improved to 103 but he decided to come in for evaluation.  Of note patient afebrile on arrival.  Patient also reports that he is just felt generally malaised and achy.  Patient requesting something to help with chest congestion and a note for work as he feels that he has been to congested and fatigued to go back to work.  The history is provided by the patient and medical records.      Past Medical History:  Diagnosis Date   Anxiety    Arthritis    right knee   Back pain    occasionally   Bipolar disorder ([place])    denies   COPD (chronic obstructive pulmonary disease) (HCC)    Depression    Diabetes mellitus without complication (HCC)    Generalized headaches    history of migraines-last one 02/02/14   GERD (gastroesophageal reflux disease)    takes Prilosec daily as needed   Headache    Hemorrhoids    History of bronchitis    1 month ago and treated with Amoxicillin and given an inhaler;takes Prenisone   Hyperlipidemia    takes Zetia daily   Hypertension    takes HCTZ daily   Joint pain    Pneumonia    hx of 2 yrs ago   Polysubstance abuse ([place])    Schizoaffective disorder ([place])     denies   Tuberculosis    treated for 1 year   Umbilical hernia     Patient Active Problem List   Diagnosis Date Noted   Drug overdose 01/08/2021   Type 2 diabetes mellitus with complication, without long-term current use of insulin ([place]) 05/25/2020   Educated about COVID-19 virus infection 02/23/2020   Crack cocaine overdose ([place]) 09/28/2017   Hypoglycemia 09/28/2017   Hepatitis 09/28/2017   Leucocytosis 09/28/2017   Overdose 09/28/2017   Arthritis of knee, degenerative 01/24/2015   Right knee pain 01/07/2014   Concern about STD in male without diagnosis 01/06/2014   Dysuria 12/09/2013   High cholesterol 10/25/2013   GERD (gastroesophageal reflux disease) 09/20/2013   History of positive PPD 07/17/2012   Cocaine abuse ([place]) 04/02/2012   Rhabdomyolysis 04/02/2012   Hernia of abdominal wall 01/05/2011   ERECTILE DYSFUNCTION, PSYCHOGENIC 02/13/2010   BIPOLAR DISORDER UNSPECIFIED 01/31/2009   TOBACCO ABUSE 01/31/2009   ELEVATED BP READING WITHOUT DX HYPERTENSION 01/31/2009    Past Surgical History:  Procedure Laterality Date   ESOPHAGOGASTRODUODENOSCOPY     JOINT REPLACEMENT     KNEE ARTHROSCOPY WITH MEDIAL MENISECTOMY Right 03/01/2014   Procedure: KNEE ARTHROSCOPY WITH MEDIAL MENISECTOMY;  Surgeon: Meredith Pel, MD;  Location: [facility];  Service:  Orthopedics;  Laterality: Right;   LIPOMA EXCISION  ~ 2010   "back left side of my head"   PARTIAL KNEE ARTHROPLASTY Right 01/24/2015   Procedure: RIGHT KNEE MEDIAL COMPARTMENT REPLACEMENT VS TOTAL KNEE ARTHROPLASTY.;  Surgeon: Meredith Pel, MD;  Location: [facility];  Service: Orthopedics;  Laterality: Right;  RIGHT KNEE MEDIAL COMPARTMENT REPLACEMENT VS TOTAL KNEE ARTHROPLASTY.   TOTAL KNEE ARTHROPLASTY Right        Family History  Problem Relation Age of Onset   Hypertension Mother    Cancer Mother    Heart disease Father    Heart attack Father 72   Hyperlipidemia Father    Hypertension Father    Diabetes Sister     Diabetes Brother     Social History   Tobacco Use   Smoking status: Every Day    Packs/day: 0.25    Years: 30.00    Pack years: 7.50    Types: Cigarettes   Smokeless tobacco: Never   Tobacco comments:    3 cigarettes per day (after meals)  Vaping Use   Vaping Use: Never used  Substance Use Topics   Alcohol use: No    Comment: occasionally    Drug use: Yes    Types: Marijuana, Cocaine    Comment: cocaine, night before last    Home Medications Prior to Admission medications   Medication Sig Start Date End Date Taking? Authorizing Provider  Accu-Chek Softclix Lancets lancets Check sugar twice a day.  Dx Code: E11.9 Patient not taking: Reported on 04/25/2021 02/04/20   Saguier, Percell Miller, PA-C  colchicine 0.6 MG tablet Take 1 tablet (0.6 mg total) by mouth 2 (two) times daily. Patient not taking: No sig reported 09/11/20   Robinson, [place] N, PA-C  glucose blood (ACCU-CHEK GUIDE) test strip Check sugar twice a day.  Dx Code: E11.9 Patient not taking: Reported on 04/25/2021 02/04/20   Saguier, Percell Miller, PA-C  glucose monitoring kit (FREESTYLE) monitoring kit 1 each by Does not apply route as needed for other. Patient not taking: Reported on 04/25/2021 12/24/19   Saguier, Percell Miller, PA-C  metFORMIN (GLUCOPHAGE) 500 MG tablet Take 1 tablet (500 mg total) by mouth 2 (two) times daily with a meal. Patient not taking: Reported on 04/25/2021 01/11/21 04/25/21  Donne Hazel, MD  OneTouch Delica Lancets 97X MISC USE TO CHECK BLOOD SUGAR 3 TIMES DAILY. DX E11.9 Patient not taking: Reported on 04/25/2021 02/04/20   Saguier, Percell Miller, PA-C    Allergies    Mushroom extract complex, Penicillins, Other, and Penicillins  Review of Systems   Review of Systems  Constitutional:  Positive for chills and fever.  HENT:  Positive for congestion, rhinorrhea and sore throat.   Respiratory:  Positive for cough and shortness of breath.   Cardiovascular:  Negative for chest pain.  Gastrointestinal:  Positive for  abdominal pain, nausea and vomiting. Negative for constipation and diarrhea.  Genitourinary:  Positive for flank pain. Negative for dysuria and frequency.  Musculoskeletal:  Positive for myalgias. Negative for back pain.  Skin:  Negative for color change and rash.  Neurological:  Negative for headaches.  All other systems reviewed and are negative.  Physical Exam Updated Vital Signs BP 124/83 (BP Location: Left Arm)   Pulse 76   Temp 98.6 F (37 C) (Oral)   Resp 18   SpO2 100%   Physical Exam Vitals and nursing note reviewed.  Constitutional:      General: He is not in acute distress.  Appearance: Normal appearance. He is well-developed and normal weight. He is not ill-appearing or diaphoretic.     Comments: Well-appearing and in no distress   HENT:     Head: Normocephalic and atraumatic.     Mouth/Throat:     Mouth: Mucous membranes are moist.     Pharynx: Oropharynx is clear.  Eyes:     General:        Right eye: No discharge.        Left eye: No discharge.  Cardiovascular:     Rate and Rhythm: Normal rate and regular rhythm.     Pulses: Normal pulses.     Heart sounds: Normal heart sounds.  Pulmonary:     Effort: Pulmonary effort is normal. No respiratory distress.     Breath sounds: Normal breath sounds. No wheezing or rales.     Comments: Respirations equal and unlabored, patient able to speak in full sentences, lungs clear to auscultation bilaterally, satting 100% on room air Abdominal:     General: Bowel sounds are normal. There is no distension.     Palpations: Abdomen is soft. There is no mass.     Tenderness: There is abdominal tenderness. There is no guarding.     Comments: Abdomen soft, nondistended, very mild epigastric tenderness, all other quadrants nontender, no guarding or peritoneal signs.  Musculoskeletal:        General: No deformity.     Cervical back: Neck supple.  Skin:    General: Skin is warm and dry.     Capillary Refill: Capillary refill  takes less than 2 seconds.  Neurological:     Mental Status: He is alert and oriented to person, place, and time.     Coordination: Coordination normal.     Comments: Speech is clear, able to follow commands Moves extremities without ataxia, coordination intact  Psychiatric:        Mood and Affect: Mood normal.        Behavior: Behavior normal.    ED Results / Procedures / Treatments   Labs (all labs ordered are listed, but only abnormal results are displayed) Labs Reviewed  COMPREHENSIVE METABOLIC PANEL - Abnormal; Notable for the following components:      Result Value   Sodium 134 (*)    Glucose, Bld 113 (*)    Calcium 8.8 (*)    All other components within normal limits  URINALYSIS, ROUTINE W REFLEX MICROSCOPIC - Abnormal; Notable for the following components:   APPearance HAZY (*)    Leukocytes,Ua MODERATE (*)    All other components within normal limits  RESP PANEL BY RT-PCR (FLU A&B, COVID) ARPGX2  URINE CULTURE  LIPASE, BLOOD  CBC WITH DIFFERENTIAL/PLATELET    EKG None  Radiology DG Chest 2 View  Result Date: 07/21/2021 CLINICAL DATA:  Cough and fevers EXAM: CHEST - 2 VIEW COMPARISON:  04/16/2021 FINDINGS: Cardiac shadow is stable. The lungs are well aerated bilaterally. Emphysematous changes are noted in the upper lobes worse on the right than the left similar to that seen on the prior exam. No focal infiltrate or sizable effusion is seen. Bony structures are within normal limits. IMPRESSION: Emphysematous changes stable in appearance from the prior exam. No acute abnormality noted. Electronically Signed   By: Inez Catalina M.D.   On: 07/21/2021 19:47    Procedures Procedures   Medications Ordered in ED Medications  cefTRIAXone (ROCEPHIN) 1 g in sodium chloride 0.9 % 100 mL IVPB (has no administration in time range)  ipratropium-albuterol (DUONEB) 0.5-2.5 (3) MG/3ML nebulizer solution 3 mL (has no administration in time range)    ED Course  I have reviewed  the triage vital signs and the nursing notes.  Pertinent labs & imaging results that were available during my care of the patient were reviewed by me and considered in my medical decision making (see chart for details).    MDM Rules/Calculators/A&P                           55 year old male presents to the emergency department with reported fever at home, associated with cough, some epigastric discomfort and nausea and general malaise over the past 3 days.  On arrival patient is afebrile with normal vitals.  He is overall well-appearing.  He reports his chest feels congested but on exam his lungs are clear with no wheezing, no coughing during exam, and patient satting at 100% on room air.  Patient denies chest pain.  He has very mild epigastric tenderness on exam but has not had any vomiting here in the ED.  He denies urinary symptoms but does have some tenderness over bilateral flanks.  No other findings on exam.  Lab evaluation initiated from triage as well as chest x-ray and COVID testing.  Patient reports fevers up to 105 at home, but has not taken any medication the seem to have resolved without intervention.  I have independently ordered, reviewed and interpreted all labs and imaging:  CBC: No leukocytosis, normal hemoglobin CMP: No significant electrolyte derangements, normal renal and liver function Lipase: WNL COVID/flu: Negative  UA with moderate leukocytes and 21-50 WBCs, no bacteria present but given that patient has flank tenderness on exam we will culture and treat for urinary tract infection given malaise and reported fevers.  Also discussed with patient that this could be related to a viral upper respiratory infection.  He was treated here with improvement.  Prescribed Keflex, encouraged him to use Pepcid and over-the-counter medication such as Maalox or Tums to help with burning epigastric pain that he has had intermittently.   At this time there does not appear to be any  evidence of an acute emergency medical condition and the patient appears stable for discharge with appropriate outpatient follow up.Diagnosis was discussed with patient who verbalizes understanding and is agreeable to discharge.    Final Clinical Impression(s) / ED Diagnoses Final diagnoses:  Acute UTI  Upper respiratory tract infection, unspecified type    Rx / DC Orders ED Discharge Orders          Ordered    cephALEXin (KEFLEX) 500 MG capsule  4 times daily        07/22/21 1204    dextromethorphan-guaiFENesin (MUCINEX DM) 30-600 MG 12hr tablet  2 times daily        07/22/21 1204             Jacqlyn Larsen, [place] 07/22/21 [place], [place], DO 07/22/21 1527

## 2021-07-23 LAB — URINE CULTURE: Culture: NO GROWTH

## 2021-07-27 ENCOUNTER — Telehealth: Payer: Self-pay | Admitting: Podiatry

## 2021-07-27 NOTE — Telephone Encounter (Signed)
Diabetic shoes/inserts in..lvm for pt to call to schedule an appt to pick them up. 

## 2021-09-10 ENCOUNTER — Telehealth: Payer: Self-pay | Admitting: Podiatry

## 2021-09-10 NOTE — Telephone Encounter (Signed)
Left message again for pt to call to schedule an appt to pick up diabetic shoes and it has to be on or before 12.15 as auth expires.

## 2021-09-25 ENCOUNTER — Telehealth: Payer: Self-pay | Admitting: Podiatry

## 2021-09-25 NOTE — Telephone Encounter (Signed)
Pt called to see if his diabetic shoes were in. I explained that I have left a couple of messages for him and the authorization has now expired. I told him we have to get the paperwork re signed by the pcp. He is asking when we get it to get him schedule late in the afternoon due to his work.

## 2021-09-27 ENCOUNTER — Telehealth: Payer: Self-pay | Admitting: Podiatry

## 2021-09-27 NOTE — Telephone Encounter (Signed)
Received updated documents for diabetic shoes. Called pt to schedule and his voicemail is full

## 2021-10-02 ENCOUNTER — Ambulatory Visit: Payer: Medicare Other

## 2021-10-10 ENCOUNTER — Telehealth: Payer: Self-pay | Admitting: Podiatry

## 2021-10-10 NOTE — Telephone Encounter (Signed)
Pt left message when I was out of the office last week.  I returned call and it went to voicemail and I left message for pt to call back.

## 2021-10-22 ENCOUNTER — Encounter: Payer: Self-pay | Admitting: Podiatry

## 2021-10-22 ENCOUNTER — Ambulatory Visit: Payer: Medicare Other

## 2021-10-22 ENCOUNTER — Other Ambulatory Visit: Payer: Self-pay

## 2021-10-22 DIAGNOSIS — M2042 Other hammer toe(s) (acquired), left foot: Secondary | ICD-10-CM | POA: Diagnosis not present

## 2021-10-22 DIAGNOSIS — M2012 Hallux valgus (acquired), left foot: Secondary | ICD-10-CM

## 2021-10-22 DIAGNOSIS — M2011 Hallux valgus (acquired), right foot: Secondary | ICD-10-CM

## 2021-10-22 DIAGNOSIS — M2041 Other hammer toe(s) (acquired), right foot: Secondary | ICD-10-CM | POA: Diagnosis not present

## 2021-10-22 DIAGNOSIS — E1142 Type 2 diabetes mellitus with diabetic polyneuropathy: Secondary | ICD-10-CM | POA: Diagnosis not present

## 2021-10-22 DIAGNOSIS — E118 Type 2 diabetes mellitus with unspecified complications: Secondary | ICD-10-CM

## 2021-10-22 NOTE — Progress Notes (Signed)
SITUATION Reason for Visit: Fitting of Diabetic Shoes & Insoles Patient / Caregiver Report:  Patient reports comfort and is satisfied  OBJECTIVE DATA: Patient History / Diagnosis:     ICD-10-CM   1. Acquired hallux valgus of both feet  M20.11    M20.12     2. Type 2 diabetes mellitus with complication, without long-term current use of insulin (HCC)  E11.8       Change in Status:   None  ACTIONS PERFORMED: In-Person Delivery, patient was fit with: - 1x pair [id number] PDAC approved prefabricated Diabetic Shoes: Apex X801M - 3x pair [id number] PDAC approved CAM milled custom diabetic insoles  Shoes and insoles were verified for structural integrity and safety. Patient wore shoes and insoles in office. Skin was inspected and free of areas of concern after wearing shoes and inserts. Shoes and inserts fit properly. Patient / Caregiver provided with ferbal instruction and demonstration regarding donning, doffing, wear, care, proper fit, function, purpose, cleaning, and use of shoes and insoles ' and in all related precautions and risks and benefits regarding shoes and insoles. Patient / Caregiver was instructed to wear properly fitting socks with shoes at all times. Patient was also provided with verbal instruction regarding how to report any failures or malfunctions of shoes or inserts, and necessary follow up care. Patient / Caregiver was also instructed to contact physician regarding change in status that may affect function of shoes and inserts.   Patient / Caregiver verbalized undersatnding of instruction provided. Patient / Caregiver demonstrated independence with proper donning and doffing of shoes and inserts.  PLAN Patient to follow up as needed. Plan of care was discussed with and agreed upon by patient and/or caregiver. All questions were answered and concerns addressed.

## 2021-10-29 ENCOUNTER — Ambulatory Visit (HOSPITAL_COMMUNITY)
Admission: EM | Admit: 2021-10-29 | Discharge: 2021-10-29 | Disposition: A | Discharge status: Home / Self Care | Payer: Medicare Other

## 2021-10-29 ENCOUNTER — Encounter (HOSPITAL_COMMUNITY): Payer: Self-pay

## 2021-10-29 ENCOUNTER — Other Ambulatory Visit: Payer: Self-pay

## 2021-10-29 DIAGNOSIS — Z1881 Retained glass fragments: Secondary | ICD-10-CM

## 2021-10-29 DIAGNOSIS — S60351A Superficial foreign body of right thumb, initial encounter: Secondary | ICD-10-CM | POA: Diagnosis not present

## 2021-10-29 DIAGNOSIS — T148XXA Other injury of unspecified body region, initial encounter: Secondary | ICD-10-CM

## 2021-10-29 NOTE — ED Triage Notes (Signed)
Pt states has broken glass to rt thumb since Friday.

## 2021-10-29 NOTE — ED Provider Notes (Signed)
[facility]    CSN: [id number] Arrival date & time: 10/29/21  1343      History   Chief Complaint Chief Complaint  Patient presents with   glass in thumb    HPI ORLANDER NORWOOD is a 56 y.o. male presenting with concern for glass in the right thumb since Friday (1/20).  Medical history diabetes.  States that a piece of broken glass got in his right thumb at the supermarket, he got most of it out but is concerned that a small piece remains as the area is still tender.  States that thumb seems to have healed over it, but he has been tempted to take a knife to this himself at home. Tdap UTD 2017. Denies sensation changes.   HPI  Past Medical History:  Diagnosis Date   Anxiety    Arthritis    right knee   Back pain    occasionally   Bipolar disorder ([place])    denies   COPD (chronic obstructive pulmonary disease) (HCC)    Depression    Diabetes mellitus without complication (HCC)    Generalized headaches    history of migraines-last one 02/02/14   GERD (gastroesophageal reflux disease)    takes Prilosec daily as needed   Headache    Hemorrhoids    History of bronchitis    1 month ago and treated with Amoxicillin and given an inhaler;takes Prenisone   Hyperlipidemia    takes Zetia daily   Hypertension    takes HCTZ daily   Joint pain    Pneumonia    hx of 2 yrs ago   Polysubstance abuse ([place])    Schizoaffective disorder ([place])    denies   Tuberculosis    treated for 1 year   Umbilical hernia     Patient Active Problem List   Diagnosis Date Noted   Drug overdose 01/08/2021   Type 2 diabetes mellitus with complication, without long-term current use of insulin ([place]) 05/25/2020   Educated about COVID-19 virus infection 02/23/2020   Crack cocaine overdose ([place]) 09/28/2017   Hypoglycemia 09/28/2017   Hepatitis 09/28/2017   Leucocytosis 09/28/2017   Overdose 09/28/2017   Arthritis of knee, degenerative 01/24/2015   Right knee pain 01/07/2014   Concern about  STD in male without diagnosis 01/06/2014   Dysuria 12/09/2013   High cholesterol 10/25/2013   GERD (gastroesophageal reflux disease) 09/20/2013   History of positive PPD 07/17/2012   Cocaine abuse ([place]) 04/02/2012   Rhabdomyolysis 04/02/2012   Hernia of abdominal wall 01/05/2011   ERECTILE DYSFUNCTION, PSYCHOGENIC 02/13/2010   BIPOLAR DISORDER UNSPECIFIED 01/31/2009   TOBACCO ABUSE 01/31/2009   ELEVATED BP READING WITHOUT DX HYPERTENSION 01/31/2009    Past Surgical History:  Procedure Laterality Date   ESOPHAGOGASTRODUODENOSCOPY     JOINT REPLACEMENT     KNEE ARTHROSCOPY WITH MEDIAL MENISECTOMY Right 03/01/2014   Procedure: KNEE ARTHROSCOPY WITH MEDIAL MENISECTOMY;  Surgeon: Meredith Pel, MD;  Location: [facility];  Service: Orthopedics;  Laterality: Right;   LIPOMA EXCISION  ~ 2010   "back left side of my head"   PARTIAL KNEE ARTHROPLASTY Right 01/24/2015   Procedure: RIGHT KNEE MEDIAL COMPARTMENT REPLACEMENT VS TOTAL KNEE ARTHROPLASTY.;  Surgeon: Meredith Pel, MD;  Location: [facility];  Service: Orthopedics;  Laterality: Right;  RIGHT KNEE MEDIAL COMPARTMENT REPLACEMENT VS TOTAL KNEE ARTHROPLASTY.   TOTAL KNEE ARTHROPLASTY Right        Home Medications    Prior to Admission medications  Medication Sig Start Date End Date Taking? Authorizing Provider  cephALEXin (KEFLEX) 500 MG capsule Take 1 capsule (500 mg total) by mouth 4 (four) times daily. 07/22/21   Jacqlyn Larsen, PA-C  dextromethorphan-guaiFENesin (MUCINEX DM) 30-600 MG 12hr tablet Take 1 tablet by mouth 2 (two) times daily. 07/22/21   Jacqlyn Larsen, PA-C  OneTouch Delica Lancets 55D MISC USE TO CHECK BLOOD SUGAR 3 TIMES DAILY. DX E11.9 Patient not taking: Reported on 04/25/2021 02/04/20   Saguier, Percell Miller, PA-C    Family History Family History  Problem Relation Age of Onset   Hypertension Mother    Cancer Mother    Heart disease Father    Heart attack Father 5   Hyperlipidemia Father    Hypertension Father     Diabetes Sister    Diabetes Brother     Social History Social History   Tobacco Use   Smoking status: Every Day    Packs/day: 0.25    Years: 30.00    Pack years: 7.50    Types: Cigarettes   Smokeless tobacco: Never   Tobacco comments:    3 cigarettes per day (after meals)  Vaping Use   Vaping Use: Never used  Substance Use Topics   Alcohol use: No    Comment: occasionally    Drug use: Yes    Types: Marijuana, Cocaine    Comment: cocaine, night before last     Allergies   Mushroom extract complex, Penicillins, Other, and Penicillins   Review of Systems Review of Systems  Skin:  Positive for wound.  All other systems reviewed and are negative.   Physical Exam Triage Vital Signs ED Triage Vitals  Enc Vitals Group     BP 10/29/21 1423 (!) 137/91     Pulse Rate 10/29/21 1423 74     Resp 10/29/21 1423 18     Temp 10/29/21 1423 (!) 97.5 F (36.4 C)     Temp src --      SpO2 10/29/21 1423 99 %     Weight --      Height --      Head Circumference --      Peak Flow --      Pain Score 10/29/21 1425 4     Pain Loc --      Pain Edu? --      Excl. in [place]? --    No data found.  Updated Vital Signs BP (!) 137/91 (BP Location: Left Arm)    Pulse 74    Temp (!) 97.5 F (36.4 C)    Resp 18    SpO2 99%   Visual Acuity Right Eye Distance:   Left Eye Distance:   Bilateral Distance:    Right Eye Near:   Left Eye Near:    Bilateral Near:     Physical Exam Vitals reviewed.  Constitutional:      General: He is not in acute distress.    Appearance: Normal appearance. He is not ill-appearing.  HENT:     Head: Normocephalic and atraumatic.  Pulmonary:     Effort: Pulmonary effort is normal.  Skin:    Comments: See image below R thumb with small 74mm firm nodule, tender to palpation. No laceration, discharge, warmth, erythema. ROM IP joint intact and without pain. Cap refill <2 seconds.  Neurological:     General: No focal deficit present.     Mental Status:  He is alert and oriented to person, place, and time.  Psychiatric:  Mood and Affect: Mood normal.        Behavior: Behavior normal.        Thought Content: Thought content normal.        Judgment: Judgment normal.      UC Treatments / Results  Labs (all labs ordered are listed, but only abnormal results are displayed) Labs Reviewed - No data to display  EKG   Radiology No results found.  Procedures Procedures (including critical care time)  Medications Ordered in UC Medications - No data to display  Initial Impression / Assessment and Plan / UC Course  I have reviewed the triage vital signs and the nursing notes.  Pertinent labs & imaging results that were available during my care of the patient were reviewed by me and considered in my medical decision making (see chart for details).     This patient is a very pleasant 56 y.o. year old male presenting with concern for small piece of glass in R thumb. Afebrile, nontachy. Neurovascularly intact.  Ultrasound performed in clinic by Dr. Rolena Infante which did indicate small piece of glass in the palmar aspect distal phalanx R thumb.   Discussed that given the size of the piece of glass, do not recommend removal with instrumentation today. This will likely work its way to the surface with warm compresses / epsoam salt baths. He is in agreement.   ED return precautions discussed. Patient verbalizes understanding and agreement.   Discussed treatment plan with attending physicians Dr. Windy Carina and Dr. Rolena Infante who are in agreement.      Final Clinical Impressions(s) / UC Diagnoses   Final diagnoses:  Glass foreign body in skin     Discharge Instructions      -Warm compresses or soak in warm water baths -The glass will gradually work its way to the surface. I do not recommend digging around in the finger with a knife as this could damage your hand and cause lasting scar tissue . -Follow-up if new redness, pain,  swelling, etc.      ED Prescriptions   None    PDMP not reviewed this encounter.   Hazel Sams, PA-C 10/29/21 1505

## 2021-10-29 NOTE — Discharge Instructions (Addendum)
-  Warm compresses or soak in warm water baths -The glass will gradually work its way to the surface. I do not recommend digging around in the finger with a knife as this could damage your hand and cause lasting scar tissue . -Follow-up if new redness, pain, swelling, etc.

## 2022-01-11 ENCOUNTER — Other Ambulatory Visit: Payer: Self-pay

## 2022-01-11 ENCOUNTER — Emergency Department (HOSPITAL_COMMUNITY)
Admission: EM | Admit: 2022-01-11 | Discharge: 2022-01-11 | Disposition: A | Discharge status: Home / Self Care | Payer: Medicare Other | Attending: Emergency Medicine | Admitting: Emergency Medicine

## 2022-01-11 ENCOUNTER — Encounter (HOSPITAL_COMMUNITY): Payer: Self-pay | Admitting: Emergency Medicine

## 2022-01-11 DIAGNOSIS — R739 Hyperglycemia, unspecified: Secondary | ICD-10-CM | POA: Insufficient documentation

## 2022-01-11 DIAGNOSIS — Z7984 Long term (current) use of oral hypoglycemic drugs: Secondary | ICD-10-CM | POA: Insufficient documentation

## 2022-01-11 DIAGNOSIS — E118 Type 2 diabetes mellitus with unspecified complications: Secondary | ICD-10-CM | POA: Diagnosis present

## 2022-01-11 LAB — CBC WITH DIFFERENTIAL/PLATELET
Abs Immature Granulocytes: 0.02 10*3/uL (ref 0.00–0.07)
Basophils Absolute: 0 10*3/uL (ref 0.0–0.1)
Basophils Relative: 1 %
Eosinophils Absolute: 0.1 10*3/uL (ref 0.0–0.5)
Eosinophils Relative: 2 %
HCT: 44.7 % (ref 39.0–52.0)
Hemoglobin: 15.8 g/dL (ref 13.0–17.0)
Immature Granulocytes: 0 %
Lymphocytes Relative: 33 %
Lymphs Abs: 2.1 10*3/uL (ref 0.7–4.0)
MCH: 31.3 pg (ref 26.0–34.0)
MCHC: 35.3 g/dL (ref 30.0–36.0)
MCV: 88.7 fL (ref 80.0–100.0)
Monocytes Absolute: 0.6 10*3/uL (ref 0.1–1.0)
Monocytes Relative: 9 %
Neutro Abs: 3.5 10*3/uL (ref 1.7–7.7)
Neutrophils Relative %: 55 %
Platelets: 301 10*3/uL (ref 150–400)
RBC: 5.04 MIL/uL (ref 4.22–5.81)
RDW: 12 % (ref 11.5–15.5)
WBC: 6.4 10*3/uL (ref 4.0–10.5)
nRBC: 0 % (ref 0.0–0.2)

## 2022-01-11 LAB — I-STAT VENOUS BLOOD GAS, ED
Acid-Base Excess: 1 mmol/L (ref 0.0–2.0)
Bicarbonate: 27 mmol/L (ref 20.0–28.0)
Calcium, Ion: 1.09 mmol/L — ABNORMAL LOW (ref 1.15–1.40)
HCT: 46 % (ref 39.0–52.0)
Hemoglobin: 15.6 g/dL (ref 13.0–17.0)
O2 Saturation: 63 %
Potassium: 4.4 mmol/L (ref 3.5–5.1)
Sodium: 133 mmol/L — ABNORMAL LOW (ref 135–145)
TCO2: 28 mmol/L (ref 22–32)
pCO2, Ven: 46.7 mmHg (ref 44–60)
pH, Ven: 7.37 (ref 7.25–7.43)
pO2, Ven: 34 mmHg (ref 32–45)

## 2022-01-11 LAB — URINALYSIS, ROUTINE W REFLEX MICROSCOPIC
Bilirubin Urine: NEGATIVE
Glucose, UA: >=500 mg/dL — AB
Hgb urine dipstick: NEGATIVE
Ketones, ur: 20 mg/dL — AB
Nitrite: NEGATIVE
Protein, ur: NEGATIVE mg/dL
Specific Gravity, Urine: 1.037 — ABNORMAL HIGH (ref 1.005–1.030)
pH: 5 (ref 5.0–8.0)

## 2022-01-11 LAB — COMPREHENSIVE METABOLIC PANEL
ALT: 17 U/L (ref 0–44)
AST: 21 U/L (ref 15–41)
Albumin: 3.8 g/dL (ref 3.5–5.0)
Alkaline Phosphatase: 96 U/L (ref 38–126)
Anion gap: 9 (ref 5–15)
BUN: 9 mg/dL (ref 6–20)
CO2: 25 mmol/L (ref 22–32)
Calcium: 9.7 mg/dL (ref 8.9–10.3)
Chloride: 99 mmol/L (ref 98–111)
Creatinine, Ser: 1.03 mg/dL (ref 0.61–1.24)
GFR, Estimated: >60 mL/min (ref >60)
Glucose, Bld: 384 mg/dL — ABNORMAL HIGH (ref 70–99)
Potassium: 4.3 mmol/L (ref 3.5–5.1)
Sodium: 133 mmol/L — ABNORMAL LOW (ref 135–145)
Total Bilirubin: 0.9 mg/dL (ref 0.3–1.2)
Total Protein: 7.7 g/dL (ref 6.5–8.1)

## 2022-01-11 LAB — HEMOGLOBIN A1C
Hgb A1c MFr Bld: 14.5 % — ABNORMAL HIGH (ref 4.8–5.6)
Mean Plasma Glucose: 369.45 mg/dL

## 2022-01-11 LAB — CBG MONITORING, ED: Glucose-Capillary: 355 mg/dL — ABNORMAL HIGH (ref 70–99)

## 2022-01-11 LAB — BETA-HYDROXYBUTYRIC ACID: Beta-Hydroxybutyric Acid: 1.28 mmol/L — ABNORMAL HIGH (ref 0.05–0.27)

## 2022-01-11 MED ORDER — SODIUM CHLORIDE 0.9 % IV BOLUS
1000.0000 mL | Freq: Once | INTRAVENOUS | Status: AC
  Administered 2022-01-11: 1000 mL via INTRAVENOUS

## 2022-01-11 MED ORDER — GLIPIZIDE 5 MG PO TABS: 2.5000 mg | ORAL_TABLET | Freq: Two times a day (BID) | ORAL | 0 refills | Status: DC

## 2022-01-11 NOTE — ED Notes (Signed)
All discharge instructions including follow up care and prescriptions reviewed with patient and patient verbalized understanding of same. Patient stable and ambulatory at time of discharge.  

## 2022-01-11 NOTE — ED Provider Triage Note (Signed)
Emergency Medicine Provider Triage Evaluation Note ? ?Andrew [name] , a 56 y.o. male  was evaluated in triage.  Pt complains of hyperglycemia for the past two weeks. The patient reports that they were seen at a hospital in [place] last week for the past.  The patient reports he is a type II diabetic but not medicated as his A1c was doing well.  He reports his blood sugar today was 562.  I asked him what encouraged him to start taking his blood sugars again which she did not answer the question.  Complains of no other symptoms. ? ?Review of Systems  ?Positive:  ?Negative:  ? ?Physical Exam  ?BP (!) 147/98 (BP Location: Right Arm)   Pulse 95   Resp 16   SpO2 97%  ?Gen:   Awake, no distress   ?Resp:  Normal effort  ?MSK:   Moves extremities without difficulty  ?Other:   ? ?Medical Decision Making  ?Medically screening exam initiated at 1:43 PM.  Appropriate orders placed.  Andrew [name] was informed that the remainder of the evaluation will be completed by another provider, this initial triage assessment does not replace that evaluation, and the importance of remaining in the ED until their evaluation is complete. ? ?Patient well-appearing, vital signs stable.  Will order DKA work-up. ?  ?Sherrell Puller, PA-C ?01/11/22 1346 ? ?

## 2022-01-11 NOTE — Consult Note (Addendum)
?Date: 01/11/2022     ?     ?     ?Patient Name:  Andrew [name] MRN: [id number]  ?DOB: 1966/02/23 Age / Sex: 56 y.o., male   ?PCP: Marzetta Board, NP    ?     ?Requesting Physician: Dr. Almyra Free, Greggory Brandy, MD    ?Consulting Reason:  Hyperglycemia, uncontrolled diabetes    ? ?Chief Complaint: Hyperglycemia ? ?History of Present Illness: Hyperglycemic to 1100 a couple weeks ago, at the end of March. CBG has been up and down between 300-500. Hospital in [place], maybe [place]. Hospital stay 1 week, 2 days. Advised that he was eating well, but did not check CBG for 2 days. Passed out with no recollection of events. Given Farxiga upon discharge felt chest tightness, dyspnea, felt claustrophobic. Took x 1 week then d/c two weeks ago. Only time he was able to catch his breath is when drinking water. CBG increased on SSI, stopped, then advised that began to decrease upon stopping. Has not taken any medications since. Noticed that he was lightheaded and dizzy as CBG increased. Denies dyspnea with ambulation. ?CVA a while ago with Broca's aphasia, no residual sx. ? ?Controlled with diet and exercise, kept around 100, HA if inc to 115. Diagnosed in 2014, on medications initially then taken off, around 2019.  ? ?He does report drinking Gatorade, non-sugar free daily for about 1 week. ? ?Fluids given in ED. N/V, emesis at home x 2 with eating something he did not like at a restaurant but feeling well now. Reports inc ur freq but denies ab pain, dysuria, hematuria. ? ?PCP at [facility]. Dr. Caryl Pina (unsure of last name). ? ?Previous meds: Glipizide- worked well ?Metformin- rash, GI discomfort ?Wilder Glade- see above. ? ? ?Meds: ?No current facility-administered medications for this encounter.  ? ?Current Outpatient Medications  ?Medication Sig Dispense Refill  ? FARXIGA 5 MG TABS tablet Take 5 mg by mouth daily.    ? metoprolol succinate (TOPROL-XL) 25 MG 24 hr tablet Take 25 mg by mouth daily.    ? cephALEXin (KEFLEX) 500 MG  capsule Take 1 capsule (500 mg total) by mouth 4 (four) times daily. (Patient not taking: Reported on 01/11/2022) 28 capsule 0  ? dextromethorphan-guaiFENesin (MUCINEX DM) 30-600 MG 12hr tablet Take 1 tablet by mouth 2 (two) times daily. (Patient not taking: Reported on 01/11/2022) 14 tablet 0  ? OneTouch Delica Lancets 83J MISC USE TO CHECK BLOOD SUGAR 3 TIMES DAILY. DX E11.9 (Patient not taking: Reported on 04/25/2021) 100 each 3  ? ? ?Allergies: ?Allergies as of 01/11/2022 - Review Complete 01/11/2022  ?Allergen Reaction Noted  ? Mushroom extract complex Hives 11/26/2014  ? Penicillins Hives 12/20/2008  ? Other Hives and Other (See Comments) 02/07/2020  ? Penicillins Hives 09/28/2017  ? ?Past Medical History:  ?Diagnosis Date  ? Anxiety   ? Arthritis   ? right knee  ? Back pain   ? occasionally  ? Bipolar disorder ([place])   ? denies  ? COPD (chronic obstructive pulmonary disease) ([place])   ? Depression   ? Diabetes mellitus without complication ([place])   ? Generalized headaches   ? history of migraines-last one 02/02/14  ? GERD (gastroesophageal reflux disease)   ? takes Prilosec daily as needed  ? Headache   ? Hemorrhoids   ? History of bronchitis   ? 1 month ago and treated with Amoxicillin and given an inhaler;takes Prenisone  ? Hyperlipidemia   ? takes Zetia daily  ?  Hypertension   ? takes HCTZ daily  ? Joint pain   ? Pneumonia   ? hx of 2 yrs ago  ? Polysubstance abuse ([place])   ? Schizoaffective disorder ([place])   ? denies  ? Tuberculosis   ? treated for 1 year  ? Umbilical hernia   ? ?Past Surgical History:  ?Procedure Laterality Date  ? ESOPHAGOGASTRODUODENOSCOPY    ? JOINT REPLACEMENT    ? KNEE ARTHROSCOPY WITH MEDIAL MENISECTOMY Right 03/01/2014  ? Procedure: KNEE ARTHROSCOPY WITH MEDIAL MENISECTOMY;  Surgeon: Meredith Pel, MD;  Location: [facility];  Service: Orthopedics;  Laterality: Right;  ? LIPOMA EXCISION  ~ 2010  ? "back left side of my head"  ? PARTIAL KNEE ARTHROPLASTY Right 01/24/2015  ? Procedure: RIGHT KNEE  MEDIAL COMPARTMENT REPLACEMENT VS TOTAL KNEE ARTHROPLASTY.;  Surgeon: Meredith Pel, MD;  Location: [facility];  Service: Orthopedics;  Laterality: Right;  RIGHT KNEE MEDIAL COMPARTMENT REPLACEMENT VS TOTAL KNEE ARTHROPLASTY.  ? TOTAL KNEE ARTHROPLASTY Right   ? ?Family History  ?Problem Relation Age of Onset  ? Hypertension Mother   ? Cancer Mother   ? Heart disease Father   ? Heart attack Father 42  ? Hyperlipidemia Father   ? Hypertension Father   ? Diabetes Sister   ? Diabetes Brother   ? ?Social History  ? ?Socioeconomic History  ? Marital status: Single  ?  Spouse name: Not on file  ? Number of children: Not on file  ? Years of education: Not on file  ? Highest education level: Not on file  ?Occupational History  ? Occupation: lifts and pulls concrete  ?  Employer: [employer]   ?Tobacco Use  ? Smoking status: Every Day  ?  Packs/day: 0.25  ?  Years: 30.00  ?  Pack years: 7.50  ?  Types: Cigarettes  ? Smokeless tobacco: Never  ? Tobacco comments:  ?  3 cigarettes per day (after meals)  ?Vaping Use  ? Vaping Use: Never used  ?Substance and Sexual Activity  ? Alcohol use: No  ?  Comment: occasionally   ? Drug use: Yes  ?  Types: Marijuana, Cocaine  ?  Comment: cocaine, night before last  ? Sexual activity: Yes  ?Other Topics Concern  ? Not on file  ?Social History Narrative  ? ** Merged History Encounter **  ?    ? Lives with girlfriend. Work involves a lot of pulling and lifting concrete, was not definite about type of work. Denies use of illegal drugs. Smokes half pack of cigarettes daily.  ? ?Social Determinants of Health  ? ?Financial Resource Strain: Not on file  ?Food Insecurity: Not on file  ?Transportation Needs: Not on file  ?Physical Activity: Not on file  ?Stress: Not on file  ?Social Connections: Not on file  ?Intimate Partner Violence: Not on file  ? ? ?Review of Systems: ?Pertinent items are noted in HPI. ? ?Physical Exam: ?Blood pressure (!) 129/94, pulse 96, temperature 98.5 ?F (36.9 ?C),  temperature source Oral, resp. rate 16, SpO2 98 %. ?BP (!) 129/94 (BP Location: Left Arm)   Pulse 96   Temp 98.5 ?F (36.9 ?C) (Oral)   Resp 16   SpO2 98%  ? ?General: well-appearing middle-aged male in NAD ?HENT: normocephalic, atraumatic, external nares and ears appear normal ?EYES: conjunctiva non-erythematous, no scleral icterus ?Pulmonary: normal work of breathing on RA ?Neurological: Awake, alert and oriented x3. No focal deficits of CNII-XII. Gait intact. ?Psych: irritable affect  and behavior  ? ?Lab results: ? ?  Latest Ref Rng & Units 01/11/2022  ?  2:12 PM 01/11/2022  ?  1:58 PM 07/21/2021  ?  7:28 PM  ?CMP  ?Glucose 70 - 99 mg/dL  384   113    ?BUN 6 - 20 mg/dL  9   10    ?Creatinine 0.61 - 1.24 mg/dL  1.03   0.99    ?Sodium 135 - 145 mmol/L 133   133   134    ?Potassium 3.5 - 5.1 mmol/L 4.4   4.3   3.7    ?Chloride 98 - 111 mmol/L  99   102    ?CO2 22 - 32 mmol/L  25   23    ?Calcium 8.9 - 10.3 mg/dL  9.7   8.8    ?Total Protein 6.5 - 8.1 g/dL  7.7   6.9    ?Total Bilirubin 0.3 - 1.2 mg/dL  0.9   0.9    ?Alkaline Phos 38 - 126 U/L  96   66    ?AST 15 - 41 U/L  21   28    ?ALT 0 - 44 U/L  17   15    ? ? ? ?Imaging results:  ?No results found. ? ?Other results: ?EKG: none ? ?Assessment, Plan, & Recommendations by Problem: ?Active Problems: ?  Type 2 diabetes mellitus with complication, without long-term current use of insulin ([place]) ? ? ?#T2DM w/ hyperglycemia, uncontrolled ?A1c 14.5%, increased from 6.6% last year. Patient currently not on glycemic control agents. BG 384 on CMP, given 1L fluid bolus in the ED. UA with >500 glucose, 20 ketones, moderate leukocytes, and few bacteria. No other urinary sx other than increased frequency. Low suspicion for DKA/HHS, pH 7.37, AG 9. Patient previously tolerated Glipizide well and is agreeable to resuming for hyperglycemia; does not seem enthused to start unfamiliar medications. ?- If concerned glucose levels remain elevated could give short acting insulin.  ?-  Recommend discharge on Glipizide 2.5 mg BID. ?- Advise to follow-up outpatient with PCP at [facility] in 1 week. He will likely need more intensive glucose lowering medications such as insulin given elevated A1c of 14

## 2022-01-11 NOTE — Hospital Course (Addendum)
Hyperglycemic to 1100 a couple weeks ago, at the end of March. CBG has been up and down between 300-500. Hospital in [place], maybe [place]. Hospital stay 1 week, 2 days. Advised that he was eating well, but did not check CBG for 2 days. Passed out with no recollection of events. Given Farxiga upon discharge felt chest tightness, dyspnea, felt claustrophobic. Took x 1 week then d/c two weeks ago. Only time he was able to catch his breath is when drinking water. CBG increased on SSI, stopped, then advised that began to decrease upon stopping. Has not taken any medications since. Noticed that he was lightheaded and dizzy as CBG increased. Denies dyspnea with ambulation. ?CVA a while ago with Broca's aphasia, no residual sx. ? ?Controlled with diet and exercise, kept around 100, HA if inc to 115. Diagnosed in 2014, on medications initially then taken off, around 2019.  ? ?No medications given in ED. N/V, emesis at home x 2 with eating something he did not like at a [employer]. Denies ab pain,  ?Reports inc ur freq. ?Drinking Gatorade, non-sugar free about 1 week. ? ?PCP at [facility]. Dr. Caryl Pina (unsure of last name). ? ?Previous meds: Glipizide- worked well ?Metformin- rash, chest pain ?Wilder Glade- see above. ? ?

## 2022-01-11 NOTE — Discharge Instructions (Signed)
Call your primary care doctor or specialist as discussed in the next 2-3 days.   Return immediately back to the ER if:  Your symptoms worsen within the next 12-24 hours. You develop new symptoms such as new fevers, persistent vomiting, new pain, shortness of breath, or new weakness or numbness, or if you have any other concerns.  

## 2022-01-11 NOTE — ED Triage Notes (Signed)
Patient here with complaint of hyperglycemia, reports CBG 562, patient states she does not take medication anymore after a normal A1c and has been managing his condition with diet. ?

## 2022-01-14 NOTE — ED Provider Notes (Signed)
?[facility] ?Provider Note ? ? ?CSN: [id number] ?Arrival date & time: 01/11/22  1335 ? ?  ? ?History ? ?Chief Complaint  ?Patient presents with  ? Hyperglycemia  ? ? ?Andrew [name] is a 56 y.o. male. ? ?Patient presents to ER chief complaint of high blood sugar.  Patient states symptoms been ongoing for the past several weeks.  He was admitted for high blood sugar and given insulin sliding scale however he states that that never really works for him.  He was then switched to various different medications including Farxiga, metformin among other medications that he has tried which he states has not worked and he presents back to the ER.  Otherwise denies any fevers or cough or any vomiting or diarrhea.  No headache no chest pain no abdominal pain. ? ? ?  ? ?Home Medications ?Prior to Admission medications   ?Medication Sig Start Date End Date Taking? Authorizing Provider  ?FARXIGA 5 MG TABS tablet Take 5 mg by mouth daily. 12/30/21  Yes [provider]  ?glipiZIDE (GLUCOTROL) 5 MG tablet Take 0.5 tablets (2.5 mg total) by mouth 2 (two) times daily before a meal. 01/11/22 02/10/22 Yes Angellica Maddison, Greggory Brandy, MD  ?metoprolol succinate (TOPROL-XL) 25 MG 24 hr tablet Take 25 mg by mouth daily. 12/27/21  Yes [provider]  ?cephALEXin (KEFLEX) 500 MG capsule Take 1 capsule (500 mg total) by mouth 4 (four) times daily. ?Patient not taking: Reported on 01/11/2022 07/22/21   Jacqlyn Larsen, PA-C  ?dextromethorphan-guaiFENesin [facility] DM) 30-600 MG 12hr tablet Take 1 tablet by mouth 2 (two) times daily. ?Patient not taking: Reported on 01/11/2022 07/22/21   Jacqlyn Larsen, PA-C  ?OneTouch Delica Lancets 71I MISC USE TO CHECK BLOOD SUGAR 3 TIMES DAILY. DX E11.9 ?Patient not taking: Reported on 04/25/2021 02/04/20   Saguier, Percell [place], PA-C  ?   ? ?Allergies    ?Mushroom extract complex, Penicillins, Other, and Penicillins   ? ?Review of Systems   ?Review of Systems  ?Constitutional:  Negative  for fever.  ?HENT:  Negative for ear pain and sore throat.   ?Eyes:  Negative for pain.  ?Respiratory:  Negative for cough.   ?Cardiovascular:  Negative for chest pain.  ?Gastrointestinal:  Negative for abdominal pain.  ?Genitourinary:  Negative for flank pain.  ?Musculoskeletal:  Negative for back pain.  ?Skin:  Negative for color change and rash.  ?Neurological:  Negative for syncope.  ?All other systems reviewed and are negative. ? ?Physical Exam ?Updated Vital Signs ?BP 132/90 (BP Location: Left Arm)   Pulse 90   Temp 98.4 ?F (36.9 ?C) (Oral)   Resp 17   SpO2 98%  ?Physical Exam ?Constitutional:   ?   Appearance: He is well-developed.  ?HENT:  ?   Head: Normocephalic.  ?   Nose: Nose normal.  ?Eyes:  ?   Extraocular Movements: Extraocular movements intact.  ?Cardiovascular:  ?   Rate and Rhythm: Normal rate.  ?Pulmonary:  ?   Effort: Pulmonary effort is normal.  ?Abdominal:  ?   Tenderness: There is no abdominal tenderness. There is no guarding or rebound.  ?Skin: ?   Coloration: Skin is not jaundiced.  ?Neurological:  ?   Mental Status: He is alert. Mental status is at baseline.  ? ? ?ED Results / Procedures / Treatments   ?Labs ?(all labs ordered are listed, but only abnormal results are displayed) ?Labs Reviewed  ?COMPREHENSIVE METABOLIC PANEL - Abnormal; Notable for  the following components:  ?    Result Value  ? Sodium 133 (*)   ? Glucose, Bld 384 (*)   ? All other components within normal limits  ?BETA-HYDROXYBUTYRIC ACID - Abnormal; Notable for the following components:  ? Beta-Hydroxybutyric Acid 1.28 (*)   ? All other components within normal limits  ?URINALYSIS, ROUTINE W REFLEX MICROSCOPIC - Abnormal; Notable for the following components:  ? APPearance HAZY (*)   ? Specific Gravity, Urine 1.037 (*)   ? Glucose, UA >=500 (*)   ? Ketones, ur 20 (*)   ? Leukocytes,Ua MODERATE (*)   ? Bacteria, UA FEW (*)   ? All other components within normal limits  ?HEMOGLOBIN A1C - Abnormal; Notable for the  following components:  ? Hgb A1c MFr Bld 14.5 (*)   ? All other components within normal limits  ?CBG MONITORING, ED - Abnormal; Notable for the following components:  ? Glucose-Capillary 355 (*)   ? All other components within normal limits  ?I-STAT VENOUS BLOOD GAS, ED - Abnormal; Notable for the following components:  ? Sodium 133 (*)   ? Calcium, Ion 1.09 (*)   ? All other components within normal limits  ?CBC WITH DIFFERENTIAL/PLATELET  ? ? ?EKG ?None ? ?Radiology ?No results found. ? ?Procedures ?Procedures  ? ? ?Medications Ordered in ED ?Medications  ?sodium chloride 0.9 % bolus 1,000 mL (0 mLs Intravenous Stopped 01/11/22 1800)  ? ? ?ED Course/ Medical Decision Making/ A&P ?  ?                        ?Medical Decision Making ?Amount and/or Complexity of Data Reviewed ?Labs: ordered. ? ?Risk ?Prescription drug management. ? ? ?Patient with elevated blood sugar here in the ER, hemoglobin A1c elevated as well.  Anion gap however normal and bicarb normal as well. ? ?Given a liter bolus of fluids here. ? ?Case discussed with medicine, recommended glipizide which the patient was prescribed 2.5 mg twice daily.  Advised to follow-up with his doctor this week. ? ?Advised immediate return if he has fevers vomiting cough diarrhea continued high blood sugar or any additional concerns. ? ? ? ? ? ? ? ? ? ?Final Clinical Impression(s) / ED Diagnoses ?Final diagnoses:  ?Hyperglycemia  ? ? ?Rx / DC Orders ?ED Discharge Orders   ? ?      Ordered  ?  glipiZIDE (GLUCOTROL) 5 MG tablet  2 times daily before meals       ? 01/11/22 1954  ? ?  ?  ? ?  ? ? ?  ?Luna Fuse, MD ?01/14/22 2012 ? ?

## 2022-01-14 NOTE — ED Notes (Signed)
Patient refused to wear BP cuff, pulse ox and cardiac monitor when this RN went into room to obtain new set of vitals. Provider aware of same.  ?

## 2022-01-24 ENCOUNTER — Emergency Department (HOSPITAL_COMMUNITY)
Admission: EM | Admit: 2022-01-24 | Discharge: 2022-01-24 | Payer: Medicare Other | Attending: Emergency Medicine | Admitting: Emergency Medicine

## 2022-01-24 ENCOUNTER — Encounter (HOSPITAL_COMMUNITY): Payer: Self-pay | Admitting: Emergency Medicine

## 2022-01-24 DIAGNOSIS — N390 Urinary tract infection, site not specified: Secondary | ICD-10-CM | POA: Diagnosis not present

## 2022-01-24 DIAGNOSIS — R350 Frequency of micturition: Secondary | ICD-10-CM | POA: Diagnosis present

## 2022-01-24 DIAGNOSIS — R519 Headache, unspecified: Secondary | ICD-10-CM | POA: Diagnosis not present

## 2022-01-24 LAB — CBC WITH DIFFERENTIAL/PLATELET
Abs Immature Granulocytes: 0.01 10*3/uL (ref 0.00–0.07)
Basophils Absolute: 0 10*3/uL (ref 0.0–0.1)
Basophils Relative: 0 %
Eosinophils Absolute: 0.2 10*3/uL (ref 0.0–0.5)
Eosinophils Relative: 4 %
HCT: 40.7 % (ref 39.0–52.0)
Hemoglobin: 14.2 g/dL (ref 13.0–17.0)
Immature Granulocytes: 0 %
Lymphocytes Relative: 27 %
Lymphs Abs: 1.6 10*3/uL (ref 0.7–4.0)
MCH: 31.4 pg (ref 26.0–34.0)
MCHC: 34.9 g/dL (ref 30.0–36.0)
MCV: 90 fL (ref 80.0–100.0)
Monocytes Absolute: 0.6 10*3/uL (ref 0.1–1.0)
Monocytes Relative: 11 %
Neutro Abs: 3.3 10*3/uL (ref 1.7–7.7)
Neutrophils Relative %: 58 %
Platelets: 268 10*3/uL (ref 150–400)
RBC: 4.52 MIL/uL (ref 4.22–5.81)
RDW: 13 % (ref 11.5–15.5)
WBC: 5.7 10*3/uL (ref 4.0–10.5)
nRBC: 0 % (ref 0.0–0.2)

## 2022-01-24 LAB — URINALYSIS, ROUTINE W REFLEX MICROSCOPIC
Bilirubin Urine: NEGATIVE
Glucose, UA: >=500 mg/dL — AB
Hgb urine dipstick: NEGATIVE
Ketones, ur: NEGATIVE mg/dL
Nitrite: NEGATIVE
Protein, ur: NEGATIVE mg/dL
Specific Gravity, Urine: 1.026 (ref 1.005–1.030)
pH: 5 (ref 5.0–8.0)

## 2022-01-24 LAB — COMPREHENSIVE METABOLIC PANEL
ALT: 13 U/L (ref 0–44)
AST: 17 U/L (ref 15–41)
Albumin: 3.4 g/dL — ABNORMAL LOW (ref 3.5–5.0)
Alkaline Phosphatase: 81 U/L (ref 38–126)
Anion gap: 8 (ref 5–15)
BUN: 10 mg/dL (ref 6–20)
CO2: 24 mmol/L (ref 22–32)
Calcium: 9.5 mg/dL (ref 8.9–10.3)
Chloride: 105 mmol/L (ref 98–111)
Creatinine, Ser: 0.85 mg/dL (ref 0.61–1.24)
GFR, Estimated: >60 mL/min (ref >60)
Glucose, Bld: 176 mg/dL — ABNORMAL HIGH (ref 70–99)
Potassium: 3.8 mmol/L (ref 3.5–5.1)
Sodium: 137 mmol/L (ref 135–145)
Total Bilirubin: 0.7 mg/dL (ref 0.3–1.2)
Total Protein: 6.7 g/dL (ref 6.5–8.1)

## 2022-01-24 LAB — TROPONIN I (HIGH SENSITIVITY): Troponin I (High Sensitivity): 3 ng/L (ref <18)

## 2022-01-24 MED ORDER — CEPHALEXIN 500 MG PO CAPS: 500.0000 mg | ORAL_CAPSULE | Freq: Three times a day (TID) | ORAL | 0 refills | Status: AC

## 2022-01-24 NOTE — ED Triage Notes (Addendum)
Patient BIB [facility] for "not feeling well". Patient was working in a family member's yard when he was arrested for outstanding warrants. During arrest patient reported "not feeling well" and mentioned he thinks his sugar may be high. CBG 178 with EMS. Patient is alert, oriented, ambulatory, and in police custody. ? ?Patient also complains of abdominal pain and a headache. ?

## 2022-01-24 NOTE — Discharge Instructions (Signed)
Take antibiotic for your possible UTI.  Follow-up with your primary care doctor regarding your insulin management.  Come back to ER if you develop other new concerning symptoms such as but not limited to chest pain, difficulty breathing, abdominal pain, vomiting, fever. ?

## 2022-01-24 NOTE — ED Provider Notes (Signed)
?[facility] ?Provider Note ? ? ?CSN: [id number] ?Arrival date & time: 01/24/22  1039 ? ?  ? ?History ? ?Chief Complaint  ?Patient presents with  ? Not Feeling Well  ? ? ?Andrew MARSIGLIA is a 56 y.o. male.  Presents to the ER with multiple different complaints.  Per EMS report patient was being arrested when he reported that he was not feeling well and wanted a medical evaluation.  Patient states that he has been feeling generally unwell for the last few days.  States that he was concerned his blood sugar was running high.  States that he has not felt generally well since starting insulin therapy.  States that he has had a headache for the last couple days, dull, achy, moderate currently, not worst headache of his life and not sudden onset.  No associated numbness, weakness, vision or speech changes.  On review of systems patient also endorsed some chest discomfort, this has been ongoing for the last few days and currently though he does not have any chest pain.  No shortness of breath associated with this.  He also on review of systems endorses some burning with urination and urinary frequency.  Currently no pain in his abdomen. ? ?HPI ? ?  ? ?Home Medications ?Prior to Admission medications   ?Medication Sig Start Date End Date Taking? Authorizing Provider  ?albuterol (VENTOLIN HFA) 108 (90 Base) MCG/ACT inhaler Inhale 2 puffs into the lungs every 6 (six) hours as needed for wheezing or shortness of breath. 01/22/22  Yes [provider]  ?cephALEXin (KEFLEX) 500 MG capsule Take 1 capsule (500 mg total) by mouth 3 (three) times daily for 7 days. 01/24/22 01/31/22 Yes Lucrezia Starch, MD  ?glipiZIDE (GLUCOTROL) 5 MG tablet Take 0.5 tablets (2.5 mg total) by mouth 2 (two) times daily before a meal. 01/11/22 02/10/22 Yes Hong, Greggory Brandy, MD  ?LEVEMIR FLEXPEN 100 UNIT/ML FlexPen Inject 10 Units into the skin at bedtime. 01/15/22  Yes [provider]  ?ciprofloxacin (CIPRO)  500 MG tablet Take 500 mg by mouth 2 (two) times daily. 5 day course. Pt is on day 4 01/18/22   [provider]  ?FARXIGA 5 MG TABS tablet Take 5 mg by mouth daily. ?Patient not taking: Reported on 01/24/2022 12/30/21   [provider]  ?metoprolol succinate (TOPROL-XL) 25 MG 24 hr tablet Take 25 mg by mouth daily. ?Patient not taking: Reported on 01/24/2022 12/27/21   [provider]  ?Jonetta Speak Lancets 38G MISC USE TO CHECK BLOOD SUGAR 3 TIMES DAILY. DX E11.9 ?Patient not taking: Reported on 04/25/2021 02/04/20   Saguier, Percell Miller, PA-C  ?   ? ?Allergies    ?Mushroom extract complex and Penicillins   ? ?Review of Systems   ?Review of Systems  ?Constitutional:  Positive for fatigue. Negative for chills and fever.  ?HENT:  Negative for ear pain and sore throat.   ?Eyes:  Negative for pain and visual disturbance.  ?Respiratory:  Negative for cough and shortness of breath.   ?Cardiovascular:  Positive for chest pain. Negative for palpitations.  ?Gastrointestinal:  Negative for abdominal pain and vomiting.  ?Genitourinary:  Negative for dysuria and hematuria.  ?Musculoskeletal:  Negative for arthralgias and back pain.  ?Skin:  Negative for color change and rash.  ?Neurological:  Positive for headaches. Negative for seizures and syncope.  ?All other systems reviewed and are negative. ? ?Physical Exam ?Updated Vital Signs ?BP 118/78 (BP Location: Right Arm)  Pulse 90   Temp 97.8 ?F (36.6 ?C) (Oral)   Resp 15   SpO2 100%  ?Physical Exam ?Vitals and nursing note reviewed.  ?Constitutional:   ?   General: He is not in acute distress. ?   Appearance: He is well-developed.  ?HENT:  ?   Head: Normocephalic and atraumatic.  ?Eyes:  ?   Conjunctiva/sclera: Conjunctivae normal.  ?Cardiovascular:  ?   Rate and Rhythm: Normal rate and regular rhythm.  ?   Heart sounds: No murmur heard. ?Pulmonary:  ?   Effort: Pulmonary effort is normal. No respiratory distress.  ?   Breath sounds: Normal breath sounds.   ?Abdominal:  ?   Palpations: Abdomen is soft.  ?   Tenderness: There is no abdominal tenderness.  ?Musculoskeletal:     ?   General: No swelling.  ?   Cervical back: Neck supple.  ?Skin: ?   General: Skin is warm and dry.  ?   Capillary Refill: Capillary refill takes less than 2 seconds.  ?Neurological:  ?   Mental Status: He is alert.  ?Psychiatric:     ?   Mood and Affect: Mood normal.  ? ? ?ED Results / Procedures / Treatments   ?Labs ?(all labs ordered are listed, but only abnormal results are displayed) ?Labs Reviewed  ?COMPREHENSIVE METABOLIC PANEL - Abnormal; Notable for the following components:  ?    Result Value  ? Glucose, Bld 176 (*)   ? Albumin 3.4 (*)   ? All other components within normal limits  ?URINALYSIS, ROUTINE W REFLEX MICROSCOPIC - Abnormal; Notable for the following components:  ? APPearance HAZY (*)   ? Glucose, UA >=500 (*)   ? Leukocytes,Ua MODERATE (*)   ? Bacteria, UA RARE (*)   ? All other components within normal limits  ?CBC WITH DIFFERENTIAL/PLATELET  ?TROPONIN I (HIGH SENSITIVITY)  ? ? ?EKG ?None ? ?Radiology ?No results found. ? ?Procedures ?Procedures  ? ? ?Medications Ordered in ED ?Medications - No data to display ? ?ED Course/ Medical Decision Making/ A&P ?  ?                        ?Medical Decision Making ?Amount and/or Complexity of Data Reviewed ?Labs: ordered. ? ?Risk ?Prescription drug management. ? ? ?56 year old gentleman presenting to ER with police after being arrested due to concern for not feeling well.  On physical exam patient appears very well in no distress with normal vital signs.  He had multiple different complaints.  Check basic laboratory work-up, EKG, troponin.  No acute ischemic changes were noted and his troponin was very low, doubt ACS.  His electrolytes were stable.  His glucose was only mildly elevated.  No anemia or electrolyte derangement.  Urinalysis with possible UTI if he did endorse some dysuria and frequency.  Will treat with course of Keflex.   Advised follow-up with primary care doctor regarding long-term management of his diabetes.  On reassessment he remains well-appearing with normal vitals, feel he is stable for discharge at this time.  Advised patient to continue taking his insulin as was previously prescribed. ? ? ? ?After the discussed management above, the patient was determined to be safe for discharge.  The patient was in agreement with this plan and all questions regarding their care were answered.  ED return precautions were discussed and the patient will return to the ED with any significant worsening of condition. ? ? ? ? ? ? ? ? ?  Final Clinical Impression(s) / ED Diagnoses ?Final diagnoses:  ?Urinary tract infection without hematuria, site unspecified  ? ? ?Rx / DC Orders ?ED Discharge Orders   ? ?      Ordered  ?  cephALEXin (KEFLEX) 500 MG capsule  3 times daily       ? 01/24/22 1503  ? ?  ?  ? ?  ? ? ?  ?Lucrezia Starch, MD ?01/25/22 [phone] ? ?

## 2022-07-30 ENCOUNTER — Other Ambulatory Visit: Payer: Self-pay | Admitting: Family

## 2022-07-30 ENCOUNTER — Other Ambulatory Visit: Payer: Self-pay | Admitting: *Deleted

## 2022-10-17 ENCOUNTER — Emergency Department (HOSPITAL_COMMUNITY)
Admission: EM | Admit: 2022-10-17 | Discharge: 2022-10-17 | Disposition: A | Discharge status: Home / Self Care | Payer: 59 | Attending: Emergency Medicine | Admitting: Emergency Medicine

## 2022-10-17 ENCOUNTER — Other Ambulatory Visit: Payer: Self-pay

## 2022-10-17 ENCOUNTER — Emergency Department (HOSPITAL_COMMUNITY): Payer: 59

## 2022-10-17 DIAGNOSIS — Z7984 Long term (current) use of oral hypoglycemic drugs: Secondary | ICD-10-CM | POA: Insufficient documentation

## 2022-10-17 DIAGNOSIS — M79671 Pain in right foot: Secondary | ICD-10-CM | POA: Diagnosis not present

## 2022-10-17 DIAGNOSIS — Z794 Long term (current) use of insulin: Secondary | ICD-10-CM | POA: Diagnosis not present

## 2022-10-17 DIAGNOSIS — M79672 Pain in left foot: Secondary | ICD-10-CM | POA: Insufficient documentation

## 2022-10-17 DIAGNOSIS — R079 Chest pain, unspecified: Secondary | ICD-10-CM | POA: Diagnosis present

## 2022-10-17 DIAGNOSIS — R072 Precordial pain: Secondary | ICD-10-CM | POA: Insufficient documentation

## 2022-10-17 DIAGNOSIS — R739 Hyperglycemia, unspecified: Secondary | ICD-10-CM

## 2022-10-17 DIAGNOSIS — E1165 Type 2 diabetes mellitus with hyperglycemia: Secondary | ICD-10-CM | POA: Diagnosis not present

## 2022-10-17 LAB — COMPREHENSIVE METABOLIC PANEL
ALT: 25 U/L (ref 0–44)
AST: 22 U/L (ref 15–41)
Albumin: 3.8 g/dL (ref 3.5–5.0)
Alkaline Phosphatase: 105 U/L (ref 38–126)
Anion gap: 8 (ref 5–15)
BUN: 9 mg/dL (ref 6–20)
CO2: 25 mmol/L (ref 22–32)
Calcium: 9.1 mg/dL (ref 8.9–10.3)
Chloride: 103 mmol/L (ref 98–111)
Creatinine, Ser: 0.98 mg/dL (ref 0.61–1.24)
GFR, Estimated: >60 mL/min (ref >60)
Glucose, Bld: 305 mg/dL — ABNORMAL HIGH (ref 70–99)
Potassium: 4.4 mmol/L (ref 3.5–5.1)
Sodium: 136 mmol/L (ref 135–145)
Total Bilirubin: 0.7 mg/dL (ref 0.3–1.2)
Total Protein: 7.2 g/dL (ref 6.5–8.1)

## 2022-10-17 LAB — CBC WITH DIFFERENTIAL/PLATELET
Abs Immature Granulocytes: 0.02 10*3/uL (ref 0.00–0.07)
Basophils Absolute: 0 10*3/uL (ref 0.0–0.1)
Basophils Relative: 1 %
Eosinophils Absolute: 0.2 10*3/uL (ref 0.0–0.5)
Eosinophils Relative: 3 %
HCT: 42.5 % (ref 39.0–52.0)
Hemoglobin: 14.1 g/dL (ref 13.0–17.0)
Immature Granulocytes: 0 %
Lymphocytes Relative: 32 %
Lymphs Abs: 1.7 10*3/uL (ref 0.7–4.0)
MCH: 30.2 pg (ref 26.0–34.0)
MCHC: 33.2 g/dL (ref 30.0–36.0)
MCV: 91 fL (ref 80.0–100.0)
Monocytes Absolute: 0.5 10*3/uL (ref 0.1–1.0)
Monocytes Relative: 10 %
Neutro Abs: 2.9 10*3/uL (ref 1.7–7.7)
Neutrophils Relative %: 54 %
Platelets: 288 10*3/uL (ref 150–400)
RBC: 4.67 MIL/uL (ref 4.22–5.81)
RDW: 13 % (ref 11.5–15.5)
WBC: 5.4 10*3/uL (ref 4.0–10.5)
nRBC: 0 % (ref 0.0–0.2)

## 2022-10-17 LAB — D-DIMER, QUANTITATIVE: D-Dimer, Quant: 0.53 ug/mL-FEU — ABNORMAL HIGH (ref 0.00–0.50)

## 2022-10-17 LAB — CBG MONITORING, ED: Glucose-Capillary: 304 mg/dL — ABNORMAL HIGH (ref 70–99)

## 2022-10-17 LAB — TROPONIN I (HIGH SENSITIVITY): Troponin I (High Sensitivity): 3 ng/L (ref <18)

## 2022-10-17 MED ORDER — GLIPIZIDE 5 MG PO TABS
5.0000 mg | ORAL_TABLET | Freq: Once | ORAL | Status: AC
  Administered 2022-10-17: 5 mg via ORAL
  Filled 2022-10-17: qty 1

## 2022-10-17 MED ORDER — GLIPIZIDE 5 MG PO TABS: 5.0000 mg | ORAL_TABLET | Freq: Every day | ORAL | 0 refills | Status: DC

## 2022-10-17 NOTE — ED Provider Triage Note (Signed)
Emergency Medicine Provider Triage Evaluation Note  Andrew [name] , a 57 y.o. male  was evaluated in triage.  Pt complains of chest pain which began last night.  Pain is currently rated 7 out of 10 in severity.  Patient states the pain is in the left part of his chest and feels sharp in nature.  He denies radiation of symptoms.  He also endorses some shortness of breath that started at the same time as the chest pain.  The patient also relates bilateral leg numbness.  He states has been ongoing for some time.  The patient is a diabetic who is insulin-dependent.  He states he was released from jail on Monday and has not had his insulin or glipizide since his release.  Review of Systems  Positive: As above Negative: As above  Physical Exam  BP (!) 150/84 (BP Location: Left Arm)   Pulse 74   Temp 97.8 F (36.6 C)   Resp (!) 22   Ht 5' 7.5" (1.715 m)   Wt 112 kg   SpO2 96%   BMI 38.11 kg/m  Gen:   Awake, no distress   Resp:  Normal effort  MSK:   Moves extremities without difficulty  Other:    Medical Decision Making  Medically screening exam initiated at 10:29 AM.  Appropriate orders placed.  Andrew [name] was informed that the remainder of the evaluation will be completed by another provider, this initial triage assessment does not replace that evaluation, and the importance of remaining in the ED until their evaluation is complete.     Dorothyann Peng, PA-C 10/17/22 1032

## 2022-10-17 NOTE — ED Provider Notes (Signed)
[facility] EMERGENCY DEPARTMENT Provider Note   CSN: [id number] Arrival date & time: 10/17/22  1018     History  Chief Complaint  Patient presents with   Chest Pain   Foot Pain   Nausea   Dizziness    Andrew [name] is a 57 y.o. male.  Patient presents to the emergency department today for evaluation of chest pain and bilateral foot pain.  Patient has a history of foot pain chronically due to diabetes.  He has previously been on insulin and glipizide.  He states that he was recently in jail and got out 2 to 3 days ago.  He was on his insulin medications while in jail, however did not have his medications once he left and is currently out.  He states that he has a follow-up with his doctor in 4 days, but cannot get in sooner.  He reports having an intermittent sharp pain in the left/mid chest.  This is worse with movement and deep breathing.  Mild associated shortness of breath but does not stop him from doing normal activities.  Pain occurs spontaneously and randomly.  No association with activity.  No associated diaphoresis or vomiting.  Pain does not radiate.  Patient denies risk factors for pulmonary embolism including: unilateral leg swelling, history of DVT/PE/other blood clots, use of exogenous hormones, recent immobilizations, recent surgery, recent travel (>4hr segment), malignancy, hemoptysis.  States that the foot pain has been going on for a long time.  He states that his feet are swollen bilaterally.       Home Medications Prior to Admission medications   Medication Sig Start Date End Date Taking? Authorizing Provider  albuterol (VENTOLIN HFA) 108 (90 Base) MCG/ACT inhaler Inhale 2 puffs into the lungs every 6 (six) hours as needed for wheezing or shortness of breath. 01/22/22   [provider]  ciprofloxacin (CIPRO) 500 MG tablet Take 500 mg by mouth 2 (two) times daily. 5 day course. Pt is on day 4 01/18/22   [provider]  FARXIGA 5 MG  TABS tablet Take 5 mg by mouth daily. Patient not taking: Reported on 01/24/2022 12/30/21   [provider]  glipiZIDE (GLUCOTROL) 5 MG tablet Take 0.5 tablets (2.5 mg total) by mouth 2 (two) times daily before a meal. 01/11/22 02/10/22  Luna Fuse, MD  LEVEMIR FLEXPEN 100 UNIT/ML FlexPen Inject 10 Units into the skin at bedtime. 01/15/22   [provider]  metoprolol succinate (TOPROL-XL) 25 MG 24 hr tablet Take 25 mg by mouth daily. Patient not taking: Reported on 01/24/2022 12/27/21   [provider]  OneTouch Delica Lancets 82X MISC USE TO CHECK BLOOD SUGAR 3 TIMES DAILY. DX E11.9 Patient not taking: Reported on 04/25/2021 02/04/20   Saguier, Percell Miller, PA-C      Allergies    Mushroom extract complex and Penicillins    Review of Systems   Review of Systems  Physical Exam Updated Vital Signs BP (!) 150/84 (BP Location: Left Arm)   Pulse 74   Temp 97.8 F (36.6 C)   Resp (!) 22   Ht 5' 7.5" (1.715 m)   Wt 112 kg   SpO2 96%   BMI 38.11 kg/m  Physical Exam Vitals and nursing note reviewed.  Constitutional:      Appearance: He is well-developed. He is not diaphoretic.  HENT:     Head: Normocephalic and atraumatic.     Mouth/Throat:     Mouth: Mucous membranes are not  dry.  Eyes:     Conjunctiva/sclera: Conjunctivae normal.  Neck:     Vascular: Normal carotid pulses. No carotid bruit or JVD.     Trachea: Trachea normal. No tracheal deviation.  Cardiovascular:     Rate and Rhythm: Normal rate and regular rhythm.     Pulses: No decreased pulses.          Radial pulses are 2+ on the right side and 2+ on the left side.     Heart sounds: Normal heart sounds, S1 normal and S2 normal. Heart sounds not distant. No murmur heard. Pulmonary:     Effort: Pulmonary effort is normal. No respiratory distress.     Breath sounds: Decreased breath sounds (Mildly decreased) present. No wheezing, rhonchi or rales.  Chest:     Chest wall: No tenderness.  Abdominal:      General: Bowel sounds are normal.     Palpations: Abdomen is soft.     Tenderness: There is no abdominal tenderness. There is no guarding or rebound.  Musculoskeletal:     Cervical back: Normal range of motion and neck supple. No muscular tenderness.     Right lower leg: No edema.     Left lower leg: No edema.     Comments: Mild generalized tenderness of the feet bilaterally, but no signs of cellulitis, abscess, athlete's foot or other wounds/trauma.  2+ DP pulses bilaterally.   Skin:    General: Skin is warm and dry.     Coloration: Skin is not pale.  Neurological:     Mental Status: He is alert. Mental status is at baseline.  Psychiatric:        Mood and Affect: Mood normal.     ED Results / Procedures / Treatments   Labs (all labs ordered are listed, but only abnormal results are displayed) Labs Reviewed  COMPREHENSIVE METABOLIC PANEL - Abnormal; Notable for the following components:      Result Value   Glucose, Bld 305 (*)    All other components within normal limits  D-DIMER, QUANTITATIVE - Abnormal; Notable for the following components:   D-Dimer, Quant 0.53 (*)    All other components within normal limits  CBG MONITORING, ED - Abnormal; Notable for the following components:   Glucose-Capillary 304 (*)    All other components within normal limits  CBC WITH DIFFERENTIAL/PLATELET  TROPONIN I (HIGH SENSITIVITY)  TROPONIN I (HIGH SENSITIVITY)    EKG EKG Interpretation  Date/Time:  Thursday October 17 2022 10:11:13 EST Ventricular Rate:  76 PR Interval:  152 QRS Duration: 82 QT Interval:  358 QTC Calculation: 402 R Axis:   -6 Text Interpretation: Normal sinus rhythm no acute ST/T changes similar to April 2023 Confirmed by Sherwood Gambler [phone]) on 10/17/2022 2:07:11 PM  Radiology DG Chest 2 View  Result Date: 10/17/2022 CLINICAL DATA:  Chest pain EXAM: CHEST - 2 VIEW COMPARISON:  07/21/2021. FINDINGS: The heart size and mediastinal contours are within normal  limits. Bullous changes upper lungs more severe on the right. Lungs are otherwise clear. There are thoracic degenerative changes. IMPRESSION: Bullous disease.  Otherwise no acute cardiopulmonary process Electronically Signed   By: Sammie Bench M.D.   On: 10/17/2022 10:57    Procedures Procedures    Medications Ordered in ED Medications  glipiZIDE (GLUCOTROL) tablet 5 mg (has no administration in time range)    ED Course/ Medical Decision Making/ A&P    Patient seen and examined. History obtained directly from patient. Work-up including labs,  imaging, EKG ordered in triage, if performed, were reviewed.  Reviewed previous lab results from earlier in 2023.  Labs/EKG: Independently reviewed and interpreted.  This included: CBC unremarkable; CMP with elevated glucose at 305 but normal creatinine, and bicarbonate; troponin normal at 3; D-dimer 0.53 but age-adjusted cutoff is 0.57 so negative.  Imaging: Independently visualized and interpreted.  This included: Chest x-ray, agree no acute findings, chronic right apical bulla compared to previous  Medications/Fluids: Ordered: P.o. glipizide   Most recent vital signs reviewed and are as follows: BP (!) 150/84 (BP Location: Left Arm)   Pulse 74   Temp 97.8 F (36.6 C)   Resp (!) 22   Ht 5' 7.5" (1.715 m)   Wt 112 kg   SpO2 96%   BMI 38.11 kg/m   Initial impression: Patient with low risk chest pain with atypical features, cardiac workup is reassuring.  Low concern for PE, D-dimer negative with age-adjusted cut off.  Bilateral foot pain is chronic and likely related to diabetic neuropathy.  No sign of infection.  Will continue to treat blood sugars.  Patient will be given a dose of glipizide and discharged home with prescription.  He has follow-up next week and cannot fully be restarted on insulin at that time.  2:31 PM Reassessment performed. Patient appears stable.  Plan: Discharge to home.   Prescriptions written for: Oral  glipizide  Other home care instructions discussed: Avoidance of triggers, rest  Return and follow-up instructions: I encouraged patient to return to ED with severe chest pain, especially if the pain is crushing or pressure-like and spreads to the arms, back, neck, or jaw, or if they have associated sweating, vomiting, or shortness of breath with the pain, or significant pain with activity. We discussed that the evaluation here today indicates a low-risk of serious cause of chest pain, including heart trouble or a blood clot, but no evaluation is perfect and chest pain can evolve with time. The patient verbalized understanding and agreed.  I encouraged patient to follow-up with their provider in the next 48 hours for recheck.                              Medical Decision Making Risk Prescription drug management.   For this patient's complaint of chest pain, the following emergent conditions were considered on the differential diagnosis: acute coronary syndrome, pulmonary embolism, pneumothorax, myocarditis, pericardial tamponade, aortic dissection, thoracic aortic aneurysm complication, esophageal perforation.   Other causes were also considered including: gastroesophageal reflux disease, musculoskeletal pain including costochondritis, pneumonia/pleurisy, herpes zoster, pericarditis.  In regards to possibility of ACS, patient has atypical features of pain, non-ischemic and unchanged EKG and negative troponin(s). Heart score was calculated to be 3.   In regards to possibility of PE, symptoms are atypical for PE and risk profile is low and d-dimer is negative.   Patient with bilateral foot pain, no signs of ischemia, cellulitis, other infection.  Suspect that this is most likely related to diabetic neuropathy.         Final Clinical Impression(s) / ED Diagnoses Final diagnoses:  Precordial pain  Hyperglycemia without ketosis  Bilateral foot pain    Rx / DC Orders ED Discharge  Orders          Ordered    glipiZIDE (GLUCOTROL) 5 MG tablet  Daily before breakfast        10/17/22 1435  Carlisle Cater, PA-C 10/17/22 [place], MD 10/18/22 [phone]

## 2022-10-17 NOTE — Discharge Instructions (Signed)
Please read and follow all provided instructions.  Your diagnoses today include:  1. Precordial pain   2. Hyperglycemia without ketosis   3. Bilateral foot pain     Tests performed today include: An EKG of your heart A chest x-ray Cardiac enzymes - a blood test for heart muscle damage Blood counts and electrolytes: blood sugar was high Screening test for blood clot - was negative Vital signs. See below for your results today.   Medications prescribed:  Glipizide - medication for blood sugar  Take any prescribed medications only as directed.  Follow-up instructions: Please follow-up with your primary care provider as soon as you can for further evaluation of your symptoms.   Return instructions:  SEEK IMMEDIATE MEDICAL ATTENTION IF: You have severe chest pain, especially if the pain is crushing or pressure-like and spreads to the arms, back, neck, or jaw, or if you have sweating, nausea or vomiting, or trouble with breathing. THIS IS AN EMERGENCY. Do not wait to see if the pain will go away. Get medical help at once. Call 911. DO NOT drive yourself to the hospital.  Your chest pain gets worse and does not go away after a few minutes of rest.  You have an attack of chest pain lasting longer than what you usually experience.  You have significant dizziness, if you pass out, or have trouble walking.  You have chest pain not typical of your usual pain for which you originally saw your caregiver.  You have any other emergent concerns regarding your health.  Additional Information: Chest pain comes from many different causes. Your caregiver has diagnosed you as having chest pain that is not specific for one problem, but does not require admission.  You are at low risk for an acute heart condition or other serious illness.   Your vital signs today were: BP (!) 150/84 (BP Location: Left Arm)   Pulse 74   Temp 97.8 F (36.6 C)   Resp (!) 22   Ht 5' 7.5" (1.715 m)   Wt 112 kg   SpO2  96%   BMI 38.11 kg/m  If your blood pressure (BP) was elevated above 135/85 this visit, please have this repeated by your doctor within one month. --------------

## 2022-10-17 NOTE — ED Triage Notes (Signed)
Pt. Stated, I just got out of jail and Im having chest pain and the bottom of my feet are hurting. Chest pain started 2 days. Im also am a diabetic. Sometimes I get nauseated.

## 2022-11-05 ENCOUNTER — Ambulatory Visit: Payer: Medicare Other | Admitting: Internal Medicine

## 2022-11-12 ENCOUNTER — Emergency Department (HOSPITAL_COMMUNITY): Payer: 59

## 2022-11-12 ENCOUNTER — Other Ambulatory Visit: Payer: Self-pay

## 2022-11-12 ENCOUNTER — Encounter (HOSPITAL_COMMUNITY): Payer: Self-pay

## 2022-11-12 ENCOUNTER — Emergency Department (HOSPITAL_COMMUNITY)
Admission: EM | Admit: 2022-11-12 | Discharge: 2022-11-12 | Disposition: A | Discharge status: Home / Self Care | Payer: 59 | Attending: Emergency Medicine | Admitting: Emergency Medicine

## 2022-11-12 DIAGNOSIS — I10 Essential (primary) hypertension: Secondary | ICD-10-CM | POA: Insufficient documentation

## 2022-11-12 DIAGNOSIS — F17200 Nicotine dependence, unspecified, uncomplicated: Secondary | ICD-10-CM | POA: Diagnosis not present

## 2022-11-12 DIAGNOSIS — R0789 Other chest pain: Secondary | ICD-10-CM | POA: Insufficient documentation

## 2022-11-12 DIAGNOSIS — R1084 Generalized abdominal pain: Secondary | ICD-10-CM | POA: Insufficient documentation

## 2022-11-12 DIAGNOSIS — J449 Chronic obstructive pulmonary disease, unspecified: Secondary | ICD-10-CM | POA: Diagnosis not present

## 2022-11-12 DIAGNOSIS — Y900 Blood alcohol level of less than 20 mg/100 ml: Secondary | ICD-10-CM | POA: Insufficient documentation

## 2022-11-12 DIAGNOSIS — R112 Nausea with vomiting, unspecified: Secondary | ICD-10-CM | POA: Diagnosis not present

## 2022-11-12 DIAGNOSIS — R42 Dizziness and giddiness: Secondary | ICD-10-CM | POA: Diagnosis not present

## 2022-11-12 DIAGNOSIS — R109 Unspecified abdominal pain: Secondary | ICD-10-CM | POA: Diagnosis present

## 2022-11-12 DIAGNOSIS — E119 Type 2 diabetes mellitus without complications: Secondary | ICD-10-CM | POA: Diagnosis not present

## 2022-11-12 LAB — CBC WITH DIFFERENTIAL/PLATELET
Abs Immature Granulocytes: 0.02 10*3/uL (ref 0.00–0.07)
Basophils Absolute: 0 10*3/uL (ref 0.0–0.1)
Basophils Relative: 0 %
Eosinophils Absolute: 0.1 10*3/uL (ref 0.0–0.5)
Eosinophils Relative: 1 %
HCT: 40.6 % (ref 39.0–52.0)
Hemoglobin: 13.6 g/dL (ref 13.0–17.0)
Immature Granulocytes: 0 %
Lymphocytes Relative: 20 %
Lymphs Abs: 1.9 10*3/uL (ref 0.7–4.0)
MCH: 30.8 pg (ref 26.0–34.0)
MCHC: 33.5 g/dL (ref 30.0–36.0)
MCV: 91.9 fL (ref 80.0–100.0)
Monocytes Absolute: 0.7 10*3/uL (ref 0.1–1.0)
Monocytes Relative: 7 %
Neutro Abs: 6.6 10*3/uL (ref 1.7–7.7)
Neutrophils Relative %: 72 %
Platelets: 293 10*3/uL (ref 150–400)
RBC: 4.42 MIL/uL (ref 4.22–5.81)
RDW: 13.4 % (ref 11.5–15.5)
WBC: 9.3 10*3/uL (ref 4.0–10.5)
nRBC: 0 % (ref 0.0–0.2)

## 2022-11-12 LAB — COMPREHENSIVE METABOLIC PANEL
ALT: 18 U/L (ref 0–44)
AST: 27 U/L (ref 15–41)
Albumin: 3.5 g/dL (ref 3.5–5.0)
Alkaline Phosphatase: 86 U/L (ref 38–126)
Anion gap: 12 (ref 5–15)
BUN: 18 mg/dL (ref 6–20)
CO2: 24 mmol/L (ref 22–32)
Calcium: 8.6 mg/dL — ABNORMAL LOW (ref 8.9–10.3)
Chloride: 97 mmol/L — ABNORMAL LOW (ref 98–111)
Creatinine, Ser: 1.13 mg/dL (ref 0.61–1.24)
GFR, Estimated: >60 mL/min (ref >60)
Glucose, Bld: 178 mg/dL — ABNORMAL HIGH (ref 70–99)
Potassium: 3.2 mmol/L — ABNORMAL LOW (ref 3.5–5.1)
Sodium: 133 mmol/L — ABNORMAL LOW (ref 135–145)
Total Bilirubin: 0.8 mg/dL (ref 0.3–1.2)
Total Protein: 7.1 g/dL (ref 6.5–8.1)

## 2022-11-12 LAB — PROTIME-INR
INR: 1.1 (ref 0.8–1.2)
Prothrombin Time: 13.7 seconds (ref 11.4–15.2)

## 2022-11-12 LAB — ETHANOL: Alcohol, Ethyl (B): <10 mg/dL (ref <10)

## 2022-11-12 LAB — LIPASE, BLOOD: Lipase: 30 U/L (ref 11–51)

## 2022-11-12 LAB — TROPONIN I (HIGH SENSITIVITY): Troponin I (High Sensitivity): 17 ng/L (ref <18)

## 2022-11-12 MED ORDER — DICYCLOMINE HCL 20 MG PO TABS: 20.0000 mg | ORAL_TABLET | Freq: Three times a day (TID) | ORAL | 0 refills | Status: DC | PRN

## 2022-11-12 MED ORDER — PANTOPRAZOLE SODIUM 40 MG IV SOLR
40.0000 mg | Freq: Once | INTRAVENOUS | Status: DC
Start: 2022-11-12 — End: 2022-11-12

## 2022-11-12 MED ORDER — SODIUM CHLORIDE 0.9 % IV BOLUS: 1000.0000 mL | Freq: Once | INTRAVENOUS | Status: DC

## 2022-11-12 MED ORDER — ONDANSETRON HCL 4 MG/2ML IJ SOLN
4.0000 mg | Freq: Once | INTRAMUSCULAR | Status: DC
Start: 2022-11-12 — End: 2022-11-12

## 2022-11-12 MED ORDER — HYDROCHLOROTHIAZIDE 25 MG PO TABS: 25.0000 mg | ORAL_TABLET | Freq: Every day | ORAL | 0 refills | Status: DC

## 2022-11-12 MED ORDER — PANTOPRAZOLE SODIUM 40 MG PO TBEC: 40.0000 mg | DELAYED_RELEASE_TABLET | Freq: Every day | ORAL | 0 refills | Status: DC

## 2022-11-12 MED ORDER — ONDANSETRON 4 MG PO TBDP: 4.0000 mg | ORAL_TABLET | Freq: Three times a day (TID) | ORAL | 0 refills | Status: DC | PRN

## 2022-11-12 NOTE — ED Provider Triage Note (Signed)
Emergency Medicine Provider Triage Evaluation Note  Andrew [name] , a 57 y.o. male  was evaluated in triage.  Pt complains of abdominal pain and chest pain starting acutely last night which has been constant.  Patient said he had emesis which is progressed into bright red blood in his vomit.  The pain is periumbilical in his abdomen as well as substernally.  Not on blood thinners, no history of GI bleeds.  Endorses alcohol use last night..  Review of Systems  PER HPI  Physical Exam  BP 126/87 (BP Location: Right Arm)   Pulse (!) 102   Temp 98.4 F (36.9 C) (Oral)   Resp 20   Ht 5' 7.5" (1.715 m)   Wt 109.3 kg   SpO2 93%   BMI 37.19 kg/m  Gen:   Awake, no distress   Resp:  Normal effort  MSK:   Moves extremities without difficulty  Other:  Mildly tachycardic with regular rhythm, upper and extremity pulses are symmetric.  Patient has a soft umbilical hernia which is reducible, periumbilical tenderness on exam.  Medical Decision Making  Medically screening exam initiated at 9:19 AM.  Appropriate orders placed.  Andrew [name] was informed that the remainder of the evaluation will be completed by another provider, this initial triage assessment does not replace that evaluation, and the importance of remaining in the ED until their evaluation is complete.  Abdominal pain and chest pain vomiting blood.  Initial vital signs taken and the waiting room are 85%, repeat 93% with good Pleth.     Sherrill Raring, [place] 11/12/22 [phone]

## 2022-11-12 NOTE — ED Triage Notes (Signed)
Pt arrived POV from home stating he feels like he has been poisoned. Pt states he had one beer last night and ever since he has had abdominal pain and chest pain along with feeling lightheaded. Pt denies being drunk and states this is not the same feeling.

## 2022-11-12 NOTE — ED Notes (Addendum)
DC instructions reviewed with pt. PT verbalized understanding. PT declined DC vitals. PT DC

## 2022-11-12 NOTE — Discharge Instructions (Signed)
You were seen in the emergency room today with abdominal pain and vomiting.  We discussed doing a CT scan of your chest, abdomen, pelvis but you are declining this at this time.  Your lab work here is reassuring and your symptoms are improving.  Please understand that we could be missing a potentially serious issue in your abdomen.  If you develop worsening pain, vomiting blood, shortness of breath, chest pain he need to call 911 and return to the emergency department immediately.  Please call your primary care physician this morning to schedule the next available follow-up appointment for blood pressure recheck.  I have refilled your blood pressure medications.  Return with any new or suddenly worsening symptoms.

## 2022-11-12 NOTE — ED Provider Notes (Signed)
Emergency Department Provider Note   I have reviewed the triage vital signs and the nursing notes.   HISTORY  Chief Complaint Abdominal Pain   HPI Andrew [name] is a 57 y.o. male presents emergency department for evaluation of abdominal pain after drinking last night.  He states he had only 1 beer but is concerned that there may have been something in the beer causing him to feel abdominal discomfort along with chest discomfort, lightheadedness.  Denies any other drug use.  No significant vomiting or diarrhea.  Past Medical History:  Diagnosis Date   Anxiety    Arthritis    right knee   Back pain    occasionally   Bipolar disorder ([place])    denies   COPD (chronic obstructive pulmonary disease) (HCC)    Depression    Diabetes mellitus without complication (HCC)    Generalized headaches    history of migraines-last one 02/02/14   GERD (gastroesophageal reflux disease)    takes Prilosec daily as needed   Headache    Hemorrhoids    History of bronchitis    1 month ago and treated with Amoxicillin and given an inhaler;takes Prenisone   Hyperlipidemia    takes Zetia daily   Hypertension    takes HCTZ daily   Joint pain    Pneumonia    hx of 2 yrs ago   Polysubstance abuse ([place])    Schizoaffective disorder ([place])    denies   Tuberculosis    treated for 1 year   Umbilical hernia     Review of Systems  Constitutional: No fever/chills Eyes: No visual changes. ENT: No sore throat. Cardiovascular: Positive chest pain. Respiratory: Denies shortness of breath. Gastrointestinal: Positive abdominal pain.  No nausea, no vomiting.  No diarrhea.  No constipation. Genitourinary: Negative for dysuria. Musculoskeletal: Negative for back pain. Skin: Negative for rash. Neurological: Negative for headaches, focal weakness or numbness.   ____________________________________________   PHYSICAL EXAM:  VITAL SIGNS: ED Triage Vitals  Enc Vitals Group     BP 11/12/22 0913  126/87     Pulse Rate 11/12/22 0913 (!) 102     Resp 11/12/22 0913 20     Temp 11/12/22 0913 98.4 F (36.9 C)     Temp Source 11/12/22 0913 Oral     SpO2 11/12/22 0913 (!) 85 %     Weight 11/12/22 0912 241 lb (109.3 kg)     Height 11/12/22 0912 5' 7.5" (1.715 m)   Constitutional: Alert and oriented. Well appearing and in no acute distress. Eyes: Conjunctivae are normal.  Head: Atraumatic. Nose: No congestion/rhinnorhea. Mouth/Throat: Mucous membranes are moist.   Neck: No stridor.   Cardiovascular: Normal rate, regular rhythm. Good peripheral circulation. Grossly normal heart sounds.   Respiratory: Normal respiratory effort.  No retractions. Lungs CTAB. Gastrointestinal: Soft with mild lower abdominal tenderness. No distention.  Musculoskeletal: No lower extremity tenderness nor edema. No gross deformities of extremities. Neurologic:  Normal speech and language. No gross focal neurologic deficits are appreciated.  Skin:  Skin is warm, dry and intact. No rash noted.  ____________________________________________   LABS (all labs ordered are listed, but only abnormal results are displayed)  Labs Reviewed  COMPREHENSIVE METABOLIC PANEL - Abnormal; Notable for the following components:      Result Value   Sodium 133 (*)    Potassium 3.2 (*)    Chloride 97 (*)    Glucose, Bld 178 (*)    Calcium 8.6 (*)  All other components within normal limits  CBC WITH DIFFERENTIAL/PLATELET  PROTIME-INR  LIPASE, BLOOD  ETHANOL  TROPONIN I (HIGH SENSITIVITY)   ____________________________________________  EKG   EKG Interpretation  Date/Time:  Tuesday November 12 2022 09:27:53 EST Ventricular Rate:  94 PR Interval:  146 QRS Duration: 80 QT Interval:  370 QTC Calculation: 462 R Axis:   -30 Text Interpretation: Normal sinus rhythm Left axis deviation Abnormal ECG When compared with ECG of 17-Oct-2022 10:11, PREVIOUS ECG IS PRESENT Similar to Jan 11 tracing Confirmed by Nanda Quinton  [phone]) on 11/12/2022 10:25:04 AM        ____________________________________________  RADIOLOGY  DG Chest 2 View  Result Date: 11/12/2022 CLINICAL DATA:  Chest pain EXAM: CHEST - 2 VIEW COMPARISON:  Chest x-ray dated October 17, 2022 FINDINGS: Cardiac and mediastinal contours are unchanged. New central predominant airspace opacities. Upper lobe predominant emphysema with large apical blebs. No evidence of pleural effusion or pneumothorax. IMPRESSION: 1. New central predominant airspace opacities, differential considerations include infection or pulmonary edema. 2. Upper lobe predominant emphysema. Electronically Signed   By: Yetta Glassman M.D.   On: 11/12/2022 09:58    ____________________________________________   PROCEDURES  Procedure(s) performed:   Procedures  None  ____________________________________________   INITIAL IMPRESSION / ASSESSMENT AND PLAN / ED COURSE  Pertinent labs & imaging results that were available during my care of the patient were reviewed by me and considered in my medical decision making (see chart for details).   This patient is Presenting for Evaluation of CP, which does require a range of treatment options, and is a complaint that involves a high risk of morbidity and mortality.  The Differential Diagnoses includes but is not exclusive to acute coronary syndrome, aortic dissection, pulmonary embolism, cardiac tamponade, community-acquired pneumonia, pericarditis, musculoskeletal chest wall pain, etc.    Clinical Laboratory Tests Ordered, included opponent negative.  Alcohol negative.  Lipase, LFTs, bilirubin all unremarkable.  No acute kidney injury.  Radiologic Tests Ordered, included CXR. I independently interpreted the images and agree with radiology interpretation.   Cardiac Monitor Tracing which shows NSR.    Social Determinants of Health Risk patient is a smoker.    Medical Decision Making: Summary:  Patient presents emergency  department for evaluation of chest pain with abdominal discomfort and lightheadedness.  Plan for CT imaging given mild tenderness on exam.  No peritonitis.  ACS workup reassuring.  Reevaluation with update and discussion with patient at the bedside.  His lab work here is reassuring.  He is feeling much better without vomiting and wishes to be discharged.  I discussed that I had a CT scan of his chest, abdomen, pelvis for further evaluation given his description of symptoms earlier.  He verbalized understanding of this but would like to decline this imaging study at this time.  He appears to have capacity to make this decision.  He is sitting up on the edge of the bed and anxious to leave saying that he has a foot doctor appointment he cannot miss.   Patient's presentation is most consistent with acute presentation with potential threat to life or bodily function.   Disposition: discharge  ____________________________________________  FINAL CLINICAL IMPRESSION(S) / ED DIAGNOSES  Final diagnoses:  Generalized abdominal pain  Nausea and vomiting, unspecified vomiting type     NEW OUTPATIENT MEDICATIONS STARTED DURING THIS VISIT:  Discharge Medication List as of 11/12/2022 11:41 AM     START taking these medications   Details  dicyclomine (BENTYL) 20 MG  tablet Take 1 tablet (20 mg total) by mouth 3 (three) times daily as needed for spasms., Starting Tue 11/12/2022, Normal    hydrochlorothiazide (HYDRODIURIL) 25 MG tablet Take 1 tablet (25 mg total) by mouth daily., Starting Tue 11/12/2022, Until Thu 12/12/2022, Normal    ondansetron (ZOFRAN-ODT) 4 MG disintegrating tablet Take 1 tablet (4 mg total) by mouth every 8 (eight) hours as needed for nausea or vomiting., Starting Tue 11/12/2022, Normal    pantoprazole (PROTONIX) 40 MG tablet Take 1 tablet (40 mg total) by mouth daily., Starting Tue 11/12/2022, Until Thu 12/12/2022, Normal        Note:  This document was prepared using Dragon voice  recognition software and may include unintentional dictation errors.  Nanda Quinton, MD, [facility]    Sirena Riddle, [facility], MD 11/15/22 [phone]

## 2023-05-30 ENCOUNTER — Ambulatory Visit (HOSPITAL_COMMUNITY)
Admission: EM | Admit: 2023-05-30 | Discharge: 2023-05-30 | Disposition: A | Discharge status: Home / Self Care | Payer: 59 | Attending: Psychiatry | Admitting: Psychiatry

## 2023-05-30 DIAGNOSIS — Z91148 Patient's other noncompliance with medication regimen for other reason: Secondary | ICD-10-CM | POA: Insufficient documentation

## 2023-05-30 DIAGNOSIS — E119 Type 2 diabetes mellitus without complications: Secondary | ICD-10-CM | POA: Diagnosis not present

## 2023-05-30 DIAGNOSIS — F1410 Cocaine abuse, uncomplicated: Secondary | ICD-10-CM | POA: Insufficient documentation

## 2023-05-30 DIAGNOSIS — Z5900 Homelessness unspecified: Secondary | ICD-10-CM | POA: Insufficient documentation

## 2023-05-30 NOTE — Discharge Instructions (Signed)
Discharge recommendations:   Medications: Patient is to take medications as prescribed. The patient or patient's guardian is to contact a medical professional and/or outpatient provider to address any new side effects that develop. The patient or the patient's guardian should update outpatient providers of any new medications and/or medication changes.    Outpatient Follow up: Please review list of outpatient resources for psychiatry and counseling. Please follow up with your primary care provider for all medical related needs.    Therapy: We recommend that patient participate in individual therapy to address mental health concerns.   Atypical antipsychotics: If you are prescribed an atypical antipsychotic, it is recommended that your height, weight, BMI, blood pressure, fasting lipid panel, and fasting blood sugar be monitored by your outpatient providers.  Safety:   The following safety precautions should be taken:   No sharp objects. This includes scissors, razors, scrapers, and putty knives.   Chemicals should be removed and locked up.   Medications should be removed and locked up.   Weapons should be removed and locked up. This includes firearms, knives and instruments that can be used to cause injury.   The patient should abstain from use of illicit substances/drugs and abuse of any medications.  If symptoms worsen or do not continue to improve or if the patient becomes actively suicidal or homicidal then it is recommended that the patient return to the closest hospital emergency department, the [facility], or call 911 for further evaluation and treatment. National Suicide Prevention Lifeline 1-800-SUICIDE or 872-884-3776.  About 988 988 offers 24/7 access to trained crisis counselors who can help people experiencing mental health-related distress. People can call or text 988 or chat 988lifeline.org for themselves or if they are worried about a  loved one who may need crisis support.    Discharge recommendations:   Medications: Patient is to take medications as prescribed. The patient or patient's guardian is to contact a medical professional and/or outpatient provider to address any new side effects that develop. The patient or the patient's guardian should update outpatient providers of any new medications and/or medication changes.    Outpatient Follow up: Please review list of outpatient resources for psychiatry and counseling. Please follow up with your primary care provider for all medical related needs.    Therapy: We recommend that patient participate in individual therapy to address mental health concerns.   Atypical antipsychotics: If you are prescribed an atypical antipsychotic, it is recommended that your height, weight, BMI, blood pressure, fasting lipid panel, and fasting blood sugar be monitored by your outpatient providers.  Safety:   The following safety precautions should be taken:   No sharp objects. This includes scissors, razors, scrapers, and putty knives.   Chemicals should be removed and locked up.   Medications should be removed and locked up.   Weapons should be removed and locked up. This includes firearms, knives and instruments that can be used to cause injury.   The patient should abstain from use of illicit substances/drugs and abuse of any medications.  If symptoms worsen or do not continue to improve or if the patient becomes actively suicidal or homicidal then it is recommended that the patient return to the closest hospital emergency department, the [facility], or call 911 for further evaluation and treatment. National Suicide Prevention Lifeline 1-800-SUICIDE or 919-290-2387.  About 988 988 offers 24/7 access to trained crisis counselors who can help people experiencing mental health-related distress. People can call  or text 988 or chat 988lifeline.org for  themselves or if they are worried about a loved one who may need crisis support.

## 2023-05-30 NOTE — Progress Notes (Signed)
   05/30/23 0929  [facility] Triage Screening (Walk-ins at [facility] only)  How Did You Hear About [place]? Self  What Is the Reason for Your Visit/Call Today? Pt arrived to [facility] unaccompanied by anyone. Pt states that he has a drug (crack) problem. Pt states that his usage of crack is off an on and mostly when he is beating himself up. Pt denies SI/HI and AVH at this present moment. Pt denies the use of any alcohol or drugs in the past 24 hrs, stating that he has been fighting the urge to use. Pt states that he needs detox to help him getting away from using crack.  How Long Has This Been Causing You Problems? > than 6 months  Have You Recently Had Any Thoughts About Hurting Yourself? No  Are You Planning to Commit Suicide/Harm Yourself At This time? No  Have you Recently Had Thoughts About Hurting Someone Karolee Ohs? No  Are You Planning To Harm Someone At This Time? No  Are you currently experiencing any auditory, visual or other hallucinations? No  Have You Used Any Alcohol or Drugs in the Past 24 Hours? No  Do you have any current medical co-morbidities that require immediate attention? Yes  Please describe current medical co-morbidities that require immediate attention: Type II diabetic  Clinician description of patient physical appearance/behavior: neatly dressed, calm, cooperative  What Do You Feel Would Help You the Most Today? Social Support;Alcohol or Drug Use Treatment  If access to [facility] was not available, would you have sought care in the Emergency Department? No  Determination of Need Routine (7 days)  Options For Referral Chemical Dependency Intensive Outpatient Therapy (CDIOP);Facility-Based Crisis

## 2023-05-30 NOTE — ED Provider Notes (Signed)
Behavioral Health Urgent Care Medical Screening Exam  Patient Name: Andrew [name] MRN: [id number] Date of Evaluation: 05/30/23 Chief Complaint:   Diagnosis:  Final diagnoses:  Cocaine abuse (HCC)    History of Present illness: Andrew [name] is a 57 y.o. male.   Patient presents voluntarily complaining of increased crack/cocaine use and homelessness. He reports that he has been using crack/cocaine for many years and has not been able to stop using. Reports that he has been in rehab but has not been able to stay sober. Reports that he broke up with his girlfriend about a year ago and became homeless. He relapsed on crack/cocaine increasingly because he had to stay at the shelter "and that is where drugs are".  Patient reports that he has DM2 and  stopped taking his insulin  2 months ago and chose to use drugs. Reports that he has not seen a PCP in a while and "I don't know what my numbers are, I need to take care of myself". He reports that he has the urge of using crack/cocaine  because that is what he does every day. Patient reports that Crack/cocaine  is his only challenge now.  Patient reports that he recently tried to get into [facility] in [facility] but they would not take him due to his medical condition.  Patient denies SI/HI/AVH. Denies depression/anxiety.  He reports that he does not have any psychiatrist nor therapist. He reports no support system "I try to stay away from my family members" Patient has no children and he is currently single and homeless.   Assessment: Patient is evaluated face-to-face by this provider and chart/nursing note reviewed. 57 year-old male sitting in assessment room alone. He is cooperative upon approach. He is appropriately dressed and groomed. Alert and oriented x 4. He is calm and  fully focused on this assessment. Patient does not appear to be preoccupied or responding to internal stimuli. He denies SI/HI/AVH. He denies symptoms of depression or  anxiety. Reports that his main concern is his increased crack/cocaine use as he keeps having the urge to use "all the time". He reports that he has been using for many years but this time he is concerned about his health "I have diabetes, I stopped taking insulin and I haven't seen the doctor in a while, I do drugs all daylong". Patient reports that breaking up with his girlfriend contributed to his increased drug use. Patient denies using any other drug.  He reports no support system because he does not get along with his family members. He reports that he is scared to return to [facility] "because its too many people there and they all use drugs. Patient does not appear to be in any distress. He currently has no psychiatric services.   Provider discussed treatment options and encouraged patient to start services at [facility] for therapy and medication management.  Patient was also encouraged to follow up with his PCP for his medical condition. He is willing to present to [facility] Monday 06/02/2023 for services. No sign of distress upon discharge.   Flowsheet Row ED from 05/30/2023 in [facility] ED from 11/12/2022 in [facility] Emergency Department at [facility] ED from 10/17/2022 in [facility] Emergency Department at [facility]  C-SSRS RISK CATEGORY No Risk No Risk No Risk       Psychiatric Specialty Exam  Presentation  General Appearance:Appropriate for Environment  Eye Contact:Good  Speech:Clear and Coherent  Speech  Volume:Normal  Handedness:Right   Mood and Affect  Mood: Euthymic  Affect: Appropriate   Thought Process  Thought Processes: Coherent  Descriptions of Associations:Intact  Orientation:Full (Time, Place and Person)  Thought Content:Logical    Hallucinations:None  Ideas of Reference:None  Suicidal Thoughts:No  Homicidal Thoughts:No   Sensorium  Memory: Immediate Good; Recent Good; Remote  Good  Judgment: Fair  Insight: Fair   Chartered certified accountant: Fair  Attention Span: Fair  Recall: [employer] of Knowledge: Fair  Language: Fair   Psychomotor Activity  Psychomotor Activity: Normal   Assets  Assets: Manufacturing systems engineer; Desire for Improvement; Financial Resources/Insurance   Sleep  Sleep: Good  Number of hours:  12   Physical Exam: Physical Exam Vitals and nursing note reviewed.  Constitutional:      Appearance: Normal appearance.  HENT:     Head: Normocephalic and atraumatic.     Right Ear: Tympanic membrane normal.     Left Ear: Tympanic membrane normal.     Nose: Nose normal.  Eyes:     Extraocular Movements: Extraocular movements intact.     Pupils: Pupils are equal, round, and reactive to light.  Cardiovascular:     Rate and Rhythm: Normal rate and regular rhythm.     Pulses: Normal pulses.  Pulmonary:     Effort: Pulmonary effort is normal.  Musculoskeletal:        General: Normal range of motion.     Cervical back: Normal range of motion and neck supple.  Neurological:     General: No focal deficit present.     Mental Status: He is alert and oriented to person, place, and time.  Psychiatric:        Mood and Affect: Mood normal.        Behavior: Behavior normal.        Thought Content: Thought content normal.    Review of Systems  Constitutional: Negative.   HENT: Negative.    Eyes: Negative.   Respiratory: Negative.    Cardiovascular: Negative.   Gastrointestinal: Negative.   Genitourinary: Negative.   Musculoskeletal: Negative.   Skin: Negative.   Neurological: Negative.   Endo/Heme/Allergies: Negative.   Psychiatric/Behavioral:  Positive for substance abuse.    Blood pressure 127/86, pulse 72, temperature 98.4 F (36.9 C), temperature source Oral, resp. rate 17, SpO2 100%. There is no height or weight on file to calculate BMI.  Musculoskeletal: Strength & Muscle Tone: within normal  limits Gait & Station: normal Patient leans: N/A   [facility] MSE Discharge Disposition for Follow up and Recommendations: Based on my evaluation the patient does not appear to have an emergency medical condition and can be discharged with resources and follow up care in outpatient services for Medication Management, Substance Abuse Intensive Outpatient Program, Individual Therapy, and Group Therapy   Olin Pia, NP 05/30/2023, 10:17 AM

## 2023-07-04 ENCOUNTER — Emergency Department (HOSPITAL_COMMUNITY): Payer: 59

## 2023-07-04 ENCOUNTER — Emergency Department (HOSPITAL_COMMUNITY)
Admission: EM | Admit: 2023-07-04 | Discharge: 2023-07-04 | Disposition: A | Discharge status: Home / Self Care | Payer: 59 | Attending: Emergency Medicine | Admitting: Emergency Medicine

## 2023-07-04 DIAGNOSIS — X58XXXA Exposure to other specified factors, initial encounter: Secondary | ICD-10-CM | POA: Diagnosis not present

## 2023-07-04 DIAGNOSIS — T50901A Poisoning by unspecified drugs, medicaments and biological substances, accidental (unintentional), initial encounter: Secondary | ICD-10-CM | POA: Insufficient documentation

## 2023-07-04 DIAGNOSIS — R079 Chest pain, unspecified: Secondary | ICD-10-CM | POA: Insufficient documentation

## 2023-07-04 LAB — CBC WITH DIFFERENTIAL/PLATELET
Abs Immature Granulocytes: 0.03 10*3/uL (ref 0.00–0.07)
Basophils Absolute: 0 10*3/uL (ref 0.0–0.1)
Basophils Relative: 1 %
Eosinophils Absolute: 0.1 10*3/uL (ref 0.0–0.5)
Eosinophils Relative: 2 %
HCT: 39.5 % (ref 39.0–52.0)
Hemoglobin: 12.6 g/dL — ABNORMAL LOW (ref 13.0–17.0)
Immature Granulocytes: 1 %
Lymphocytes Relative: 11 %
Lymphs Abs: 0.7 10*3/uL (ref 0.7–4.0)
MCH: 29.8 pg (ref 26.0–34.0)
MCHC: 31.9 g/dL (ref 30.0–36.0)
MCV: 93.4 fL (ref 80.0–100.0)
Monocytes Absolute: 0.5 10*3/uL (ref 0.1–1.0)
Monocytes Relative: 8 %
Neutro Abs: 5.2 10*3/uL (ref 1.7–7.7)
Neutrophils Relative %: 77 %
Platelets: 509 10*3/uL — ABNORMAL HIGH (ref 150–400)
RBC: 4.23 MIL/uL (ref 4.22–5.81)
RDW: 13.3 % (ref 11.5–15.5)
WBC: 6.6 10*3/uL (ref 4.0–10.5)
nRBC: 0 % (ref 0.0–0.2)

## 2023-07-04 LAB — COMPREHENSIVE METABOLIC PANEL
ALT: 17 U/L (ref 0–44)
AST: 23 U/L (ref 15–41)
Albumin: 2.8 g/dL — ABNORMAL LOW (ref 3.5–5.0)
Alkaline Phosphatase: 66 U/L (ref 38–126)
Anion gap: 6 (ref 5–15)
BUN: 14 mg/dL (ref 6–20)
CO2: 29 mmol/L (ref 22–32)
Calcium: 8.8 mg/dL — ABNORMAL LOW (ref 8.9–10.3)
Chloride: 103 mmol/L (ref 98–111)
Creatinine, Ser: 1.07 mg/dL (ref 0.61–1.24)
GFR, Estimated: >60 mL/min (ref >60)
Glucose, Bld: 152 mg/dL — ABNORMAL HIGH (ref 70–99)
Potassium: 4.2 mmol/L (ref 3.5–5.1)
Sodium: 138 mmol/L (ref 135–145)
Total Bilirubin: 0.6 mg/dL (ref 0.3–1.2)
Total Protein: 6.6 g/dL (ref 6.5–8.1)

## 2023-07-04 LAB — CBG MONITORING, ED: Glucose-Capillary: 139 mg/dL — ABNORMAL HIGH (ref 70–99)

## 2023-07-04 LAB — TROPONIN I (HIGH SENSITIVITY)
Troponin I (High Sensitivity): 4 ng/L (ref <18)
Troponin I (High Sensitivity): 6 ng/L (ref <18)

## 2023-07-04 LAB — ACETAMINOPHEN LEVEL: Acetaminophen (Tylenol), Serum: <10 ug/mL — ABNORMAL LOW (ref 10–30)

## 2023-07-04 LAB — SALICYLATE LEVEL: Salicylate Lvl: <7 mg/dL — ABNORMAL LOW (ref 7.0–30.0)

## 2023-07-04 LAB — ETHANOL: Alcohol, Ethyl (B): <10 mg/dL (ref <10)

## 2023-07-04 NOTE — ED Triage Notes (Signed)
Found unconscious outside a hotel walkway. Responsive to pain initially with Fire dept. Pt is now alert and oriented x 2 with EMS. Pt is restless and falls asleep in the middle of conversation. Initially would not answer questions with EMS. Pt reports chest pain. Denies taking any drugs.
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
## 2023-07-04 NOTE — ED Provider Notes (Signed)
Patient received in signout.  Pending metabolization of the drug and to sober up.  And to ensure that patient did not intentionally try to overdose. Physical Exam  BP 106/71 (BP Location: Left Arm)   Pulse 77   Temp 98.1 F (36.7 C) (Oral)   Resp 12   SpO2 98%     Procedures  Procedures  ED Course / MDM   Clinical Course as of 07/04/23 2210  Fri Jul 04, 2023  1335 I was notified by the CT tech that the patient adamantly refused the CT of the head. [CF]  1418 Ethanol Negative.  [CF]  1418 Comprehensive metabolic panel(!) Normal.  [CF]  1418 Salicylate level(!) Negative.  [CF]  1418 Acetaminophen level(!) Negative.  [CF]  1418 CBC WITH DIFFERENTIAL(!) No evidence of leukocytosis. [CF]    Clinical Course User Index [CF] Teressa Lower, PA-C   Medical Decision Making Amount and/or Complexity of Data Reviewed Labs: ordered. Decision-making details documented in ED Course. Radiology: ordered.   Patient states that he did not intentionally try to overdose.  He has no SI.  CBC without leukocytosis without significant anemia.  CMP without renal insufficiency.  Glucose 152.  Salicylate, ethanol, Tylenol level within normal.  Troponin negative.  Patient refused CT.  He was discharged in stable condition.       Marita Kansas, PA-C 07/04/23 2212    Linwood Dibbles, MD 07/05/23 [phone]

## 2023-07-04 NOTE — ED Notes (Signed)
CBG 139. Glucometer unable to read pt's wristband.

## 2023-07-04 NOTE — ED Notes (Signed)
Patient refused to take his wet shirt with him upon discharge.

## 2023-07-04 NOTE — ED Notes (Signed)
Pt is refusing labs at this time. States, "Give me some time. I'm too cold right now." Pt was given warm blankets and paper scrubs to change. Pt became agitated and continues to refuse labs. Pt refused CBG initially then agreed to get CBG done. Pt then refused it when RN attempted to get CBG. Taken to H021 at this time.

## 2023-07-04 NOTE — ED Notes (Signed)
Pt refused to have lab drawn RN aware

## 2023-07-04 NOTE — Discharge Instructions (Signed)
Your workup today was reassuring.  For any concerning symptoms return to the emergency room.

## 2023-07-09 ENCOUNTER — Emergency Department (HOSPITAL_COMMUNITY)
Admission: EM | Admit: 2023-07-09 | Discharge: 2023-07-09 | Disposition: A | Discharge status: Home / Self Care | Payer: 59 | Attending: Emergency Medicine | Admitting: Emergency Medicine

## 2023-07-09 ENCOUNTER — Emergency Department (HOSPITAL_COMMUNITY): Payer: 59

## 2023-07-09 ENCOUNTER — Other Ambulatory Visit: Payer: Self-pay

## 2023-07-09 DIAGNOSIS — R456 Violent behavior: Secondary | ICD-10-CM | POA: Diagnosis not present

## 2023-07-09 DIAGNOSIS — W19XXXA Unspecified fall, initial encounter: Secondary | ICD-10-CM | POA: Insufficient documentation

## 2023-07-09 DIAGNOSIS — F1410 Cocaine abuse, uncomplicated: Secondary | ICD-10-CM | POA: Diagnosis not present

## 2023-07-09 DIAGNOSIS — M25552 Pain in left hip: Secondary | ICD-10-CM | POA: Insufficient documentation

## 2023-07-09 DIAGNOSIS — R4182 Altered mental status, unspecified: Secondary | ICD-10-CM | POA: Insufficient documentation

## 2023-07-09 MED ORDER — NALOXONE HCL 4 MG/0.1ML NA LIQD
4.0000 | NASAL | Status: AC
  Administered 2023-07-09: 4 via NASAL
  Filled 2023-07-09: qty 4

## 2023-07-09 MED ORDER — NALOXONE HCL 2 MG/2ML IJ SOSY
PREFILLED_SYRINGE | INTRAMUSCULAR | Status: AC
  Filled 2023-07-09: qty 2

## 2023-07-09 NOTE — ED Notes (Signed)
Pt uncooperative with discharge BP. Pt escorted by PD and remains under custody
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
## 2023-07-09 NOTE — ED Notes (Signed)
XR at bedside

## 2023-07-09 NOTE — ED Provider Notes (Signed)
[place] EMERGENCY DEPARTMENT AT [facility] Provider Note   CSN: [id number] Arrival date & time: 07/09/23  0144     History  Chief Complaint  Patient presents with   Andrew [name] is a 57 y.o. male.  The history is provided by the patient.  Hip Pain This is a new problem. The current episode started 1 to 2 hours ago. The problem occurs constantly. The problem has not changed since onset.Pertinent negatives include no chest pain, no abdominal pain, no headaches and no shortness of breath. Nothing aggravates the symptoms. Nothing relieves the symptoms. He has tried nothing for the symptoms. The treatment provided no relief.  Patient with a h/o aggression and cocaine abuse presents in police custody. Patient was reportedly tackled by officers.       Home Medications Prior to Admission medications   Medication Sig Start Date End Date Taking? Authorizing Provider  albuterol (VENTOLIN HFA) 108 (90 Base) MCG/ACT inhaler Inhale 2 puffs into the lungs every 6 (six) hours as needed for wheezing or shortness of breath. 01/22/22   [provider]  dicyclomine (BENTYL) 20 MG tablet Take 1 tablet (20 mg total) by mouth 3 (three) times daily as needed for spasms. 11/12/22   Long, Arlyss Repress, MD  glipiZIDE (GLUCOTROL) 5 MG tablet Take 1 tablet (5 mg total) by mouth daily before breakfast. 10/17/22   Renne Crigler, PA-C  hydrochlorothiazide (HYDRODIURIL) 25 MG tablet Take 1 tablet (25 mg total) by mouth daily. 11/12/22 12/12/22  Long, Arlyss Repress, MD  LEVEMIR FLEXPEN 100 UNIT/ML FlexPen Inject 10 Units into the skin at bedtime. 01/15/22   [provider]  ondansetron (ZOFRAN-ODT) 4 MG disintegrating tablet Take 1 tablet (4 mg total) by mouth every 8 (eight) hours as needed for nausea or vomiting. 11/12/22   Long, Arlyss Repress, MD  pantoprazole (PROTONIX) 40 MG tablet Take 1 tablet (40 mg total) by mouth daily. 11/12/22 12/12/22  LongArlyss Repress, MD      Allergies    Mushroom  extract complex and Penicillins    Review of Systems   Review of Systems  Constitutional:  Negative for fever.  Respiratory:  Negative for shortness of breath.   Cardiovascular:  Negative for chest pain.  Gastrointestinal:  Negative for abdominal pain.  Musculoskeletal:  Positive for arthralgias.  Neurological:  Negative for headaches.    Physical Exam Updated Vital Signs BP 130/88   Pulse (!) 102   Temp (!) 97.4 F (36.3 C)   Resp 18   Ht 5\' 7"  (1.702 m)   SpO2 100%   BMI 37.75 kg/m  Physical Exam Vitals and nursing note reviewed.  Constitutional:      General: He is not in acute distress.    Appearance: He is well-developed. He is not diaphoretic.  HENT:     Head: Normocephalic and atraumatic.     Right Ear: Tympanic membrane normal.     Left Ear: Tympanic membrane normal.  Eyes:     Conjunctiva/sclera: Conjunctivae normal.     Pupils: Pupils are equal, round, and reactive to light.  Cardiovascular:     Rate and Rhythm: Normal rate and regular rhythm.  Pulmonary:     Effort: Pulmonary effort is normal.     Breath sounds: Normal breath sounds. No wheezing or rales.  Abdominal:     General: Bowel sounds are normal.     Palpations: Abdomen is soft.     Tenderness: There is no abdominal  tenderness. There is no guarding or rebound.  Musculoskeletal:        General: No tenderness. Normal range of motion.     Right wrist: No snuff box tenderness.     Left wrist: No snuff box tenderness.     Right hand: Normal. No swelling or lacerations. Normal range of motion. There is no disruption of two-point discrimination. Normal capillary refill.     Left hand: No swelling or lacerations. Normal range of motion. There is no disruption of two-point discrimination. Normal capillary refill.     Cervical back: Normal, normal range of motion and neck supple. No tenderness.     Thoracic back: Normal.     Lumbar back: Normal.     Right hip: Normal.     Left hip: Normal.     Right  ankle: Normal.     Right Achilles Tendon: Normal.     Left ankle: Normal.     Left Achilles Tendon: Normal.  Skin:    General: Skin is warm and dry.  Neurological:     Mental Status: He is alert.     Deep Tendon Reflexes: Reflexes normal.  Psychiatric:        Behavior: Behavior is aggressive.     ED Results / Procedures / Treatments   Labs (all labs ordered are listed, but only abnormal results are displayed) Labs Reviewed - No data to display  EKG None  Radiology DG Hip Unilat W or Wo Pelvis 2-3 Views Left  Result Date: 07/09/2023 CLINICAL DATA:  Pain after a fall while running. EXAM: DG HIP (WITH OR WITHOUT PELVIS) 2-3V LEFT COMPARISON:  04/16/2021 FINDINGS: There is no evidence of hip fracture or dislocation. There is no evidence of arthropathy or other focal bone abnormality. IMPRESSION: Negative. Electronically Signed   By: Burman Nieves M.D.   On: 07/09/2023 02:35    Procedures Procedures    Medications Ordered in ED Medications  naloxone [facility]) 2 MG/2ML injection (  Not Given 07/09/23 0208)  naloxone [facility]) nasal spray 4 mg/0.1 mL (4 sprays Nasal Provided for home use 07/09/23 0208)    ED Course/ Medical Decision Making/ A&P                                 Medical Decision Making Patient BIB police following being tackled for clearance   Amount and/or Complexity of Data Reviewed Independent Historian:     Details: Police See above  External Data Reviewed: notes.    Details: Previous notes reviewed  Radiology: ordered and independent interpretation performed.    Details: Negative hip XR by me   Risk Risk Details: Patient refusing to comply with care.  Yelling at staff, attempting to kick staff with hip that is painful.  Patient is stable.  Discharged to police custody.      Final Clinical Impression(s) / ED Diagnoses Final diagnoses:  None   Return for intractable cough, coughing up blood, fevers > 100.4 unrelieved by medication, shortness of  breath, intractable vomiting, chest pain, shortness of breath, weakness, numbness, changes in speech, facial asymmetry, abdominal pain, passing out, Inability to tolerate liquids or food, cough, altered mental status or any concerns. No signs of systemic illness or infection. The patient is nontoxic-appearing on exam and vital signs are within normal limits.  I have reviewed the triage vital signs and the nursing notes. Pertinent labs & imaging results that were available during my care  of the patient were reviewed by me and considered in my medical decision making (see chart for details). After history, exam, and medical workup I feel the patient has been appropriately medically screened and is safe for discharge home. Pertinent diagnoses were discussed with the patient. Patient was given return precautions.    Rx / DC Orders ED Discharge Orders     None         Teliyah Royal, MD 07/09/23 [phone]
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
## 2023-11-24 ENCOUNTER — Emergency Department (HOSPITAL_COMMUNITY): Payer: 59

## 2023-11-24 ENCOUNTER — Encounter (HOSPITAL_COMMUNITY): Payer: Self-pay

## 2023-11-24 ENCOUNTER — Other Ambulatory Visit: Payer: Self-pay

## 2023-11-24 ENCOUNTER — Emergency Department (HOSPITAL_COMMUNITY)
Admission: EM | Admit: 2023-11-24 | Discharge: 2023-11-25 | Disposition: A | Discharge status: Home / Self Care | Payer: 59 | Attending: Emergency Medicine | Admitting: Emergency Medicine

## 2023-11-24 DIAGNOSIS — E119 Type 2 diabetes mellitus without complications: Secondary | ICD-10-CM | POA: Diagnosis not present

## 2023-11-24 DIAGNOSIS — R0602 Shortness of breath: Secondary | ICD-10-CM | POA: Insufficient documentation

## 2023-11-24 DIAGNOSIS — Z79899 Other long term (current) drug therapy: Secondary | ICD-10-CM | POA: Insufficient documentation

## 2023-11-24 DIAGNOSIS — J449 Chronic obstructive pulmonary disease, unspecified: Secondary | ICD-10-CM | POA: Diagnosis not present

## 2023-11-24 DIAGNOSIS — Z794 Long term (current) use of insulin: Secondary | ICD-10-CM | POA: Insufficient documentation

## 2023-11-24 DIAGNOSIS — Z7984 Long term (current) use of oral hypoglycemic drugs: Secondary | ICD-10-CM | POA: Insufficient documentation

## 2023-11-24 DIAGNOSIS — I10 Essential (primary) hypertension: Secondary | ICD-10-CM | POA: Insufficient documentation

## 2023-11-24 DIAGNOSIS — R079 Chest pain, unspecified: Secondary | ICD-10-CM | POA: Diagnosis present

## 2023-11-24 LAB — TROPONIN I (HIGH SENSITIVITY)
Troponin I (High Sensitivity): 4 ng/L (ref <18)
Troponin I (High Sensitivity): 4 ng/L (ref <18)

## 2023-11-24 LAB — BASIC METABOLIC PANEL
Anion gap: 9 (ref 5–15)
BUN: 20 mg/dL (ref 6–20)
CO2: 26 mmol/L (ref 22–32)
Calcium: 8.4 mg/dL — ABNORMAL LOW (ref 8.9–10.3)
Chloride: 98 mmol/L (ref 98–111)
Creatinine, Ser: 0.92 mg/dL (ref 0.61–1.24)
GFR, Estimated: >60 mL/min (ref >60)
Glucose, Bld: 350 mg/dL — ABNORMAL HIGH (ref 70–99)
Potassium: 3.9 mmol/L (ref 3.5–5.1)
Sodium: 133 mmol/L — ABNORMAL LOW (ref 135–145)

## 2023-11-24 LAB — CBC
HCT: 38.8 % — ABNORMAL LOW (ref 39.0–52.0)
Hemoglobin: 12.8 g/dL — ABNORMAL LOW (ref 13.0–17.0)
MCH: 30.8 pg (ref 26.0–34.0)
MCHC: 33 g/dL (ref 30.0–36.0)
MCV: 93.3 fL (ref 80.0–100.0)
Platelets: 390 10*3/uL (ref 150–400)
RBC: 4.16 MIL/uL — ABNORMAL LOW (ref 4.22–5.81)
RDW: 13.5 % (ref 11.5–15.5)
WBC: 9.9 10*3/uL (ref 4.0–10.5)
nRBC: 0 % (ref 0.0–0.2)

## 2023-11-24 LAB — GLUCOSE, POCT (MANUAL RESULT ENTRY): POC Glucose: 207 mg/dL — AB (ref 70–99)

## 2023-11-24 NOTE — ED Provider Triage Note (Cosign Needed)
Emergency Medicine Provider Triage Evaluation Note  NATHEN BALABAN , a 58 y.o. male  was evaluated in triage.  Pt complains of chest pain.  Started yesterday.  It is mostly central but does not radiate to the left chest.  It is sharp and intermittent pain.  Also endorses some shortness of breath.  States that he has noted some swelling in his hands and around his ankles. Shortness of breath chest pain are worse with exertion.  Review of Systems  Positive: See above Negative: See above  Physical Exam  BP 98/83 (BP Location: Right Arm)   Pulse (!) 107   Temp 98.1 F (36.7 C) (Oral)   Resp 16   Ht 5\' 7"  (1.702 m)   Wt 97.5 kg   SpO2 99%   BMI 33.67 kg/m  Gen:   Awake, no distress   Resp:  Normal effort  MSK:   Moves extremities without difficulty  Other:    Medical Decision Making  Medically screening exam initiated at 5:22 PM.  Appropriate orders placed.  KENZIE FLAKES was informed that the remainder of the evaluation will be completed by another provider, this initial triage assessment does not replace that evaluation, and the importance of remaining in the ED until their evaluation is complete.  Work up started   [employer], PA-C 11/24/23 1723

## 2023-11-24 NOTE — Congregational Nurse Program (Signed)
  Dept: [phone]   Congregational Nurse Program Note  Date of Encounter: 11/24/2023  Past Medical History: Past Medical History:  Diagnosis Date   Anxiety    Arthritis    right knee   Back pain    occasionally   Bipolar disorder (HCC)    denies   COPD (chronic obstructive pulmonary disease) (HCC)    Depression    Diabetes mellitus without complication (HCC)    Generalized headaches    history of migraines-last one 02/02/14   GERD (gastroesophageal reflux disease)    takes Prilosec daily as needed   Headache    Hemorrhoids    History of bronchitis    1 month ago and treated with Amoxicillin and given an inhaler;takes Prenisone   Hyperlipidemia    takes Zetia daily   Hypertension    takes HCTZ daily   Joint pain    Pneumonia    hx of 2 yrs ago   Polysubstance abuse (HCC)    Schizoaffective disorder (HCC)    denies   Tuberculosis    treated for 1 year   Umbilical hernia     Encounter Details:  Community Questionnaire - 11/24/23 1445       Questionnaire   Ask client: Do you give verbal consent for me to treat you today? Yes    Student Assistance N/A    Location Patient Served  [facility]    Encounter Setting CN site    Population Status Unhoused    Insurance Medicaid;Medicare    Insurance/Financial Assistance Referral N/A    Medication Have Medication Insecurities;Patient Medications Reviewed;Provided Medication Assistance    Medical Provider No    Screening Referrals Made N/A    Medical Referrals Made [facility] PCP[facility]inic    Medical Appointment Completed N/A    CNP Interventions Advocate/Support;Navigate Healthcare System;Case Management;Educate;Counsel    Screenings CN Performed Blood Pressure;Blood Glucose    Life-Saving Intervention Made N/A            Client in office states he needs to go to the Emergency Department. State he hasn't had his insulin in seven days and feels worsening neuropathy. Additionally he has had increasing falls last fall was 4 days  ago, and he is having frequent blackouts that he doesn't understand what's happening. Lastly, he states his whole body feels swollen and lung congestion. It is discovered client hasn't had any of his medicine and we are unable to schedule with [facility] or PCP today due to holiday and RN [facility] for [facility] of meds while we get him a PCP appointment. RN checked CBG and VS and stable. He also visited ED for passing out and falls and he was unable to complete CT scan or labs so no diagnostic work up done to help client identify cause. RN will assist client wishes to go the ED and he will follow up with RN tomorrow if not hospitalized for PCP appointment and help with other resources needed to assist client.

## 2023-11-24 NOTE — ED Triage Notes (Signed)
Pt reports "a few days" of chest pain, SHOB, "everything on my body is swelling", and hand tenderness.

## 2023-11-25 ENCOUNTER — Emergency Department (HOSPITAL_COMMUNITY): Payer: 59

## 2023-11-25 DIAGNOSIS — R079 Chest pain, unspecified: Secondary | ICD-10-CM | POA: Diagnosis not present

## 2023-11-25 LAB — HEPATIC FUNCTION PANEL
ALT: 18 U/L (ref 0–44)
AST: 28 U/L (ref 15–41)
Albumin: 3.2 g/dL — ABNORMAL LOW (ref 3.5–5.0)
Alkaline Phosphatase: 88 U/L (ref 38–126)
Bilirubin, Direct: 0.2 mg/dL (ref 0.0–0.2)
Indirect Bilirubin: 0.3 mg/dL (ref 0.3–0.9)
Total Bilirubin: 0.5 mg/dL (ref 0.0–1.2)
Total Protein: 6.6 g/dL (ref 6.5–8.1)

## 2023-11-25 LAB — CBG MONITORING, ED: Glucose-Capillary: 203 mg/dL — ABNORMAL HIGH (ref 70–99)

## 2023-11-25 LAB — BRAIN NATRIURETIC PEPTIDE: B Natriuretic Peptide: 36.1 pg/mL (ref 0.0–100.0)

## 2023-11-25 LAB — TROPONIN I (HIGH SENSITIVITY): Troponin I (High Sensitivity): 8 ng/L (ref <18)

## 2023-11-25 LAB — D-DIMER, QUANTITATIVE: D-Dimer, Quant: 0.82 ug{FEU}/mL — ABNORMAL HIGH (ref 0.00–0.50)

## 2023-11-25 MED ORDER — INSULIN GLARGINE-YFGN 100 UNIT/ML ~~LOC~~ SOLN
10.0000 [IU] | Freq: Every day | SUBCUTANEOUS | Status: DC
  Administered 2023-11-25: 10 [IU] via SUBCUTANEOUS
  Filled 2023-11-25: qty 0.1

## 2023-11-25 NOTE — Discharge Instructions (Addendum)
You have chosen to leave the emergency department AGAINST MEDICAL ADVICE.  The workup that was completed has been reassuring; however, we were unable to exclude a blood clot in your lung as the cause of your chest pain.  We recommend follow-up with a primary care doctor for further evaluation of your ongoing symptoms.

## 2023-11-25 NOTE — Congregational Nurse Program (Signed)
RN followed up with client after hospitalization yesterday, was able to help him understand what test and labs were done to help educate him on his health. He was not sent home with any medication to help and RN trying to help get re-established with PCP so that we can get medicine resumed. Therapeutic communication, empathetic listening and support offered to client.

## 2023-11-25 NOTE — ED Provider Notes (Signed)
EMERGENCY DEPARTMENT AT [facility] Provider Note   CSN: [id number] Arrival date & time: 11/24/23  1646     History  Chief Complaint  Patient presents with   Chest Pain   Leg Swelling    Andrew [name] is a 58 y.o. male.  58 year old male with history of HTN, HLD, COPD, DM, polysubstance abuse, bipolar and schizoaffective disorder presents to the emergency department for evaluation of chest pain.  He is a bit of a difficult historian; somewhat elusive with questioning.  Does report that chest pain began yesterday around 1600.  It has been intermittent, sharp, and relieved a bit with rest.  He has had some mild shortness of breath, diaphoresis.  No fevers, syncope, vomiting, back pain.  Has also noted increased swelling to his b/l hands and feet for the past few days.  He has been out of his diabetic medications x1 week since release from jail.  Was previously incarcerated x 6.5 months.  Did use cocaine 2 days ago.  Denies prior hx of ACS.  The history is provided by the patient. No language interpreter was used.  Chest Pain      Home Medications Prior to Admission medications   Medication Sig Start Date End Date Taking? Authorizing Provider  albuterol (VENTOLIN HFA) 108 (90 Base) MCG/ACT inhaler Inhale 2 puffs into the lungs every 6 (six) hours as needed for wheezing or shortness of breath. 01/22/22   [provider]  dicyclomine (BENTYL) 20 MG tablet Take 1 tablet (20 mg total) by mouth 3 (three) times daily as needed for spasms. 11/12/22   Long, Arlyss Repress, MD  glipiZIDE (GLUCOTROL) 5 MG tablet Take 1 tablet (5 mg total) by mouth daily before breakfast. 10/17/22   Renne Crigler, PA-C  hydrochlorothiazide (HYDRODIURIL) 25 MG tablet Take 1 tablet (25 mg total) by mouth daily. 11/12/22 12/12/22  Long, Arlyss Repress, MD  LEVEMIR FLEXPEN 100 UNIT/ML FlexPen Inject 10 Units into the skin at bedtime. 01/15/22   [provider]  ondansetron (ZOFRAN-ODT) 4 MG  disintegrating tablet Take 1 tablet (4 mg total) by mouth every 8 (eight) hours as needed for nausea or vomiting. 11/12/22   Long, Arlyss Repress, MD  pantoprazole (PROTONIX) 40 MG tablet Take 1 tablet (40 mg total) by mouth daily. 11/12/22 12/12/22  LongArlyss Repress, MD      Allergies    Mushroom extract complex (obsolete) and Penicillins    Review of Systems   Review of Systems  Cardiovascular:  Positive for chest pain.  Ten systems reviewed and are negative for acute change, except as noted in the HPI.    Physical Exam Updated Vital Signs BP 119/79   Pulse 92   Temp 98.4 F (36.9 C) (Oral)   Resp 14   Ht 5\' 7"  (1.702 m)   Wt 97.5 kg   SpO2 98%   BMI 33.67 kg/m   Physical Exam Vitals and nursing note reviewed.  Constitutional:      General: He is not in acute distress.    Appearance: He is well-developed. He is not diaphoretic.     Comments: Agitated. Appears to be tweaking. Distracted, but redirectable. Nontoxic.   HENT:     Head: Normocephalic and atraumatic.  Eyes:     General: No scleral icterus.    Conjunctiva/sclera: Conjunctivae normal.  Cardiovascular:     Rate and Rhythm: Normal rate and regular rhythm.     Pulses: Normal pulses.  Pulmonary:  Effort: Pulmonary effort is normal. No respiratory distress.     Breath sounds: No stridor. No wheezing.     Comments: Lungs CTAB. Respirations even and unlabored. Musculoskeletal:        General: Normal range of motion.     Cervical back: Normal range of motion.     Comments: Nonpitting edema of b/l hands and feet; mild.  Skin:    General: Skin is warm and dry.     Coloration: Skin is not pale.     Findings: No erythema or rash.  Neurological:     Mental Status: He is alert and oriented to person, place, and time.  Psychiatric:        Speech: Speech is tangential.        Behavior: Behavior is agitated.     ED Results / Procedures / Treatments   Labs (all labs ordered are listed, but only abnormal results are  displayed) Labs Reviewed  BASIC METABOLIC PANEL - Abnormal; Notable for the following components:      Result Value   Sodium 133 (*)    Glucose, Bld 350 (*)    Calcium 8.4 (*)    All other components within normal limits  CBC - Abnormal; Notable for the following components:   RBC 4.16 (*)    Hemoglobin 12.8 (*)    HCT 38.8 (*)    All other components within normal limits  HEPATIC FUNCTION PANEL - Abnormal; Notable for the following components:   Albumin 3.2 (*)    All other components within normal limits  D-DIMER, QUANTITATIVE - Abnormal; Notable for the following components:   D-Dimer, Quant 0.82 (*)    All other components within normal limits  CBG MONITORING, ED - Abnormal; Notable for the following components:   Glucose-Capillary 203 (*)    All other components within normal limits  BRAIN NATRIURETIC PEPTIDE  TROPONIN I (HIGH SENSITIVITY)  TROPONIN I (HIGH SENSITIVITY)  TROPONIN I (HIGH SENSITIVITY)    EKG EKG Interpretation Date/Time:  Monday November 24 2023 17:18:33 EST Ventricular Rate:  108 PR Interval:  134 QRS Duration:  86 QT Interval:  338 QTC Calculation: 452 R Axis:   -9  Text Interpretation: Sinus tachycardia Septal infarct , age undetermined Interpretation limited secondary to artifact needs repeat ekg Confirmed by Zadie Rhine ([id number]) on 11/24/2023 11:34:24 PM   EKG Interpretation Date/Time:  Tuesday November 25 2023 00:52:17 EST Ventricular Rate:  83 PR Interval:  136 QRS Duration:  82 QT Interval:  361 QTC Calculation: 425 R Axis:   32  Text Interpretation: Sinus rhythm Probable anteroseptal infarct, old Borderline T abnormalities, inferior leads Abnormal ECG Confirmed by Zadie Rhine ([id number]) on 11/25/2023 1:17:49 AM       Radiology DG Chest 2 View Result Date: 11/24/2023 CLINICAL DATA:  Chest pain and leg swelling. EXAM: CHEST - 2 VIEW COMPARISON:  November 12, 2022 FINDINGS: The heart size and mediastinal contours are within normal  limits. There is stable emphysematous lung disease with biapical bullae noted. Very mild atelectatic changes are suspected within the left lung base. No acute infiltrate, pleural effusion or pneumothorax is identified. The visualized skeletal structures are unremarkable. IMPRESSION: 1. 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 2. Very mild left basilar atelectasis. Electronically Signed   By: Aram Candela M.D.   On: 11/24/2023 19:28    Procedures Procedures    Medications Ordered in ED Medications  insulin glargine-yfgn (SEMGLEE) injection 10 Units (10 Units Subcutaneous Given 11/25/23 0214)  ED Course/ Medical Decision Making/ A&P Clinical Course as of 11/25/23 0531  Tue Nov 25, 2023  0020 Coronary CT from 2021 reviewed which showed no evidence of CAD; calcium score 0. [KH]  0020 Presently chest pain free. Requesting a sandwich. [KH]  0254 D dimer positive. CTA ordered. [KH]  0430 Patient declined CTA of the chest reporting that he is uncomfortable and closed spaces and is too claustrophobic for imaging.  I have explained to the patient that his CT is an open scan and does not typically result in feelings of claustrophobia.  Discussed with patient the importance of completion of this imaging as we are unable to exclude a blood clot in his lung which, if present, may be debilitating or potentially fatal. He remains resistant to imaging and unwilling to proceed with work up. [KH]  [phone] RN confirmed patient continues to decline CTA imaging. Will discharge AGAINST MEDICAL ADVICE. [KH]    Clinical Course User Index [KH] Antony Madura, PA-C                                 Medical Decision Making Amount and/or Complexity of Data Reviewed Labs: ordered. Radiology: ordered. ECG/medicine tests: ordered.  Risk Prescription drug management.   This patient presents to the ED for concern of chest pain, this involves an extensive number of treatment options, and is a  complaint that carries with it a high risk of complications and morbidity.  The differential diagnosis includes ACS vs PTX vs PNA vs pleural effusion vs dissection vs PE vs MSK vs anxiety vs PUD/gastritis.    Co morbidities that complicate the patient evaluation  HTN HLD DM Polysubstance abuse   Additional history obtained:  External records from outside source obtained and reviewed including cardiac CT in 2021 with calcium score of 0.   Lab Tests:  I Ordered, and personally interpreted labs.  The pertinent results include:  D dimer 0.82. Glucose 350 > 203.    Imaging Studies ordered:  I ordered imaging studies including CXR  I independently visualized and interpreted imaging which showed stable COPD findings, mild left basilar atelectasis I agree with the radiologist interpretation   Cardiac Monitoring:  The patient was maintained on a cardiac monitor.  I personally viewed and interpreted the cardiac monitored which showed an underlying rhythm of: NSR   Medicines ordered and prescription drug management:  I ordered medication including Lantus for hyperglycemia  Reevaluation of the patient after these medicines showed that the patient improved I have reviewed the patients home medicines and have made adjustments as needed   Test Considered:  CTA chest - unable to complete; patient declines imaging   Problem List / ED Course:  As above   Reevaluation:  After the interventions noted above, I reevaluated the patient and found that they have :stayed the same   Social Determinants of Health:  Polysubstance abuse   Dispostion:  Patient discharged AGAINST MEDICAL ADVICE. Departed ED in stable condition.         Final Clinical Impression(s) / ED Diagnoses Final diagnoses:  Nonspecific chest pain    Rx / DC Orders ED Discharge Orders     None         Antony Madura, PA-C 11/25/23 0535    Zadie Rhine, MD 11/25/23 [phone]

## 2024-01-15 ENCOUNTER — Ambulatory Visit (INDEPENDENT_AMBULATORY_CARE_PROVIDER_SITE_OTHER): Admitting: Podiatry

## 2024-01-15 DIAGNOSIS — Z91199 Patient's noncompliance with other medical treatment and regimen due to unspecified reason: Secondary | ICD-10-CM

## 2024-01-18 NOTE — Progress Notes (Signed)
 Patient was no-show for appointment today

## 2024-01-25 ENCOUNTER — Emergency Department (HOSPITAL_COMMUNITY)

## 2024-01-25 ENCOUNTER — Other Ambulatory Visit: Payer: Self-pay

## 2024-01-25 ENCOUNTER — Encounter (HOSPITAL_COMMUNITY): Payer: Self-pay

## 2024-01-25 ENCOUNTER — Emergency Department (HOSPITAL_COMMUNITY)
Admission: EM | Admit: 2024-01-25 | Discharge: 2024-01-25 | Disposition: A | Discharge status: Against Medical Advice | Attending: Emergency Medicine | Admitting: Emergency Medicine

## 2024-01-25 DIAGNOSIS — R0981 Nasal congestion: Secondary | ICD-10-CM | POA: Diagnosis not present

## 2024-01-25 DIAGNOSIS — R059 Cough, unspecified: Secondary | ICD-10-CM | POA: Diagnosis not present

## 2024-01-25 DIAGNOSIS — E119 Type 2 diabetes mellitus without complications: Secondary | ICD-10-CM | POA: Diagnosis not present

## 2024-01-25 DIAGNOSIS — Z5321 Procedure and treatment not carried out due to patient leaving prior to being seen by health care provider: Secondary | ICD-10-CM | POA: Insufficient documentation

## 2024-01-25 DIAGNOSIS — J439 Emphysema, unspecified: Secondary | ICD-10-CM | POA: Diagnosis not present

## 2024-01-25 NOTE — ED Provider Triage Note (Signed)
 Emergency Medicine Provider Triage Evaluation Note  Andrew [name] , [name] 58 y.o. male  was evaluated in triage.  Pt complains of cough.  Patient reports history of type 2 diabetes here with concerns of [name] cough and feelings of nasal congestion and nasal discharge.  Reports he was working outside yesterday and knows that he was exposed to [name] lot of pollen but has not taken antihistamines.  He feels that the took [name] hard bump to his head as well but denies any loss of consciousness, syncope, nausea, vomiting, or severe headache since the head impact.  Patient not on blood thinners.  Review of Systems  Positive: As above Negative: As above  Physical Exam  BP 108/76   Pulse 77   Temp 98.6 F (37 C) (Oral)   Resp 20   Ht 5\' 7"  (1.702 m)   Wt 95.3 kg   SpO2 99%   BMI 32.89 kg/m  Gen:   Awake, no distress   Resp:  Normal effort  MSK:   Moves extremities without difficulty  Other:  Head is atraumatic with no scalp lacerations or lesions seen.  Medical Decision Making  Medically screening exam initiated at 1:58 PM.  Appropriate orders placed.  Andrew [name] was informed that the remainder of the evaluation will be completed by another provider, this initial triage assessment does not replace that evaluation, and the importance of remaining in the ED until their evaluation is complete.     Andrew Minton A, PA-C 01/25/24 1359

## 2024-01-25 NOTE — ED Notes (Signed)
 Pt decided to leave and stated that he is not waiting any longer.

## 2024-01-25 NOTE — ED Triage Notes (Signed)
 Pt states he is a diabetic and his breathing stopped. He states he started gagging so he used his inhaler. Pt says he has a pulling sensation to the back of his scalp.  Pt says when he cough he lose his breath and starts to hurt.   Hx bronchitis

## 2024-01-27 ENCOUNTER — Encounter (HOSPITAL_COMMUNITY): Payer: Self-pay | Admitting: Emergency Medicine

## 2024-01-27 ENCOUNTER — Ambulatory Visit (HOSPITAL_COMMUNITY): Admission: EM | Admit: 2024-01-27 | Discharge: 2024-01-27 | Disposition: A | Discharge status: Home / Self Care

## 2024-01-27 DIAGNOSIS — R051 Acute cough: Secondary | ICD-10-CM

## 2024-01-27 DIAGNOSIS — B9789 Other viral agents as the cause of diseases classified elsewhere: Secondary | ICD-10-CM

## 2024-01-27 DIAGNOSIS — J988 Other specified respiratory disorders: Secondary | ICD-10-CM

## 2024-01-27 MED ORDER — BENZONATATE 100 MG PO CAPS: 100.0000 mg | ORAL_CAPSULE | Freq: Three times a day (TID) | ORAL | 0 refills | Status: DC

## 2024-01-27 MED ORDER — AZELASTINE HCL 0.1 % NA SOLN: 2.0000 | Freq: Two times a day (BID) | NASAL | 0 refills | Status: DC

## 2024-01-27 MED ORDER — PROMETHAZINE-DM 6.25-15 MG/5ML PO SYRP: 5.0000 mL | ORAL_SOLUTION | Freq: Every evening | ORAL | 0 refills | Status: DC | PRN

## 2024-01-27 NOTE — ED Triage Notes (Signed)
 Pt reports since last Thursday had cough, congestion, scratchy throat. Took Claritin, Zyrtec that hasn't helped. Pt cuts trees.

## 2024-01-27 NOTE — Discharge Instructions (Signed)
 I believe your symptoms are likely related to a viral illness. Take Tessalon  every 8 hours as needed for cough. Take Promethazine  DM cough syrup at night as needed for cough.  This can make you drowsy so do not drive, work, or drink alcohol while taking this. Use azelastine  nasal spray twice daily to assist with congestion. Continue taking Claritin or Zyrtec to assist with symptoms. You can also try taking over-the-counter Mucinex  to help with cough and congestion. Stay hydrated and get some rest. Return here if symptoms persist or worsen.

## 2024-01-27 NOTE — ED Provider Notes (Signed)
 [facility]    CSN: [id number] Arrival date & time: 01/27/24  1115      History   Chief Complaint Chief Complaint  Patient presents with   Cough   Nasal Congestion    HPI Andrew [name] is a 58 y.o. male.   Patient presents with cough, congestion, sore throat, and chills that began on 4/17.  Patient also endorses some intermittent chest pain and shortness of breath.  Denies known fever, abdominal pain, nausea, vomiting, and diarrhea.  Patient states he has been taking Claritin and Zyrtec without relief.  Patient states that he thought it could be related to the pollen as he works outside and cuts trees for living.  History of COPD.  Patient states that he has an albuterol  inhaler that he has not had to use over the last few days.  According to patient's chart he went to the ER on 4/20 for similar symptoms.  Patient left prior to receiving treatment, but did have an x-ray done that revealed no active cardiopulmonary disease, and revealed emphysema with large apical bulla.  The history is provided by the patient and medical records.  Cough   Past Medical History:  Diagnosis Date   Anxiety    Arthritis    right knee   Back pain    occasionally   Bipolar disorder (HCC)    denies   COPD (chronic obstructive pulmonary disease) (HCC)    Depression    Diabetes mellitus without complication (HCC)    Generalized headaches    history of migraines-last one 02/02/14   GERD (gastroesophageal reflux disease)    takes Prilosec daily as needed   Headache    Hemorrhoids    History of bronchitis    1 month ago and treated with Amoxicillin  and given an inhaler;takes Prenisone   Hyperlipidemia    takes Zetia  daily   Hypertension    takes HCTZ daily   Joint pain    Pneumonia    hx of 2 yrs ago   Polysubstance abuse (HCC)    Schizoaffective disorder (HCC)    denies   Tuberculosis    treated for 1 year   Umbilical hernia     Patient Active Problem List   Diagnosis  Date Noted   Drug overdose 01/08/2021   Type 2 diabetes mellitus with complication, without long-term current use of insulin  (HCC) 05/25/2020   Educated about COVID-19 virus infection 02/23/2020   Crack cocaine overdose (HCC) 09/28/2017   Hypoglycemia 09/28/2017   Hepatitis 09/28/2017   Leucocytosis 09/28/2017   Overdose 09/28/2017   Arthritis of knee, degenerative 01/24/2015   Right knee pain 01/07/2014   Concern about STD in male without diagnosis 01/06/2014   Dysuria 12/09/2013   High cholesterol 10/25/2013   GERD (gastroesophageal reflux disease) 09/20/2013   History of positive PPD 07/17/2012   Cocaine abuse (HCC) 04/02/2012   Rhabdomyolysis 04/02/2012   Hernia of abdominal wall 01/05/2011   ERECTILE DYSFUNCTION, PSYCHOGENIC 02/13/2010   BIPOLAR DISORDER UNSPECIFIED 01/31/2009   TOBACCO ABUSE 01/31/2009   ELEVATED BP READING WITHOUT DX HYPERTENSION 01/31/2009    Past Surgical History:  Procedure Laterality Date   ESOPHAGOGASTRODUODENOSCOPY     JOINT REPLACEMENT     KNEE ARTHROSCOPY WITH MEDIAL MENISECTOMY Right 03/01/2014   Procedure: KNEE ARTHROSCOPY WITH MEDIAL MENISECTOMY;  Surgeon: Jasmine Mesi, MD;  Location: [facility] OR;  Service: Orthopedics;  Laterality: Right;   LIPOMA EXCISION  ~ 2010   "back left side of my head"  PARTIAL KNEE ARTHROPLASTY Right 01/24/2015   Procedure: RIGHT KNEE MEDIAL COMPARTMENT REPLACEMENT VS TOTAL KNEE ARTHROPLASTY.;  Surgeon: Jasmine Mesi, MD;  Location: [facility] OR;  Service: Orthopedics;  Laterality: Right;  RIGHT KNEE MEDIAL COMPARTMENT REPLACEMENT VS TOTAL KNEE ARTHROPLASTY.   TOTAL KNEE ARTHROPLASTY Right        Home Medications    Prior to Admission medications   Medication Sig Start Date End Date Taking? Authorizing Provider  azelastine  (ASTELIN ) 0.1 % nasal spray Place 2 sprays into both nostrils 2 (two) times daily. Use in each nostril as directed 01/27/24  Yes Levora Reas A, NP  benzonatate  (TESSALON ) 100 MG capsule  Take 1 capsule (100 mg total) by mouth every 8 (eight) hours. 01/27/24  Yes Levora Reas A, NP  LANTUS  SOLOSTAR 100 UNIT/ML Solostar Pen SMARTSIG:15 Unit(s) SUB-Q Daily 01/22/24  Yes [provider]  losartan (COZAAR) 25 MG tablet Take 25 mg by mouth daily. 11/27/23  Yes [provider]  metoprolol  succinate (TOPROL -XL) 25 MG 24 hr tablet Take 25 mg by mouth daily. 01/16/24  Yes [provider]  promethazine -dextromethorphan (PROMETHAZINE -DM) 6.25-15 MG/5ML syrup Take 5 mLs by mouth at bedtime as needed for cough. 01/27/24  Yes Levora Reas A, NP  albuterol  (VENTOLIN  HFA) 108 (90 Base) MCG/ACT inhaler Inhale 2 puffs into the lungs every 6 (six) hours as needed for wheezing or shortness of breath. 01/22/22   [provider]  dicyclomine  (BENTYL ) 20 MG tablet Take 1 tablet (20 mg total) by mouth 3 (three) times daily as needed for spasms. 11/12/22   Long, Shereen Dike, MD  glipiZIDE  (GLUCOTROL ) 5 MG tablet Take 1 tablet (5 mg total) by mouth daily before breakfast. 10/17/22   Lyna Sandhoff, PA-C  hydrochlorothiazide  (HYDRODIURIL ) 25 MG tablet Take 1 tablet (25 mg total) by mouth daily. 11/12/22 12/12/22  Long, Shereen Dike, MD  LEVEMIR FLEXPEN 100 UNIT/ML FlexPen Inject 10 Units into the skin at bedtime. 01/15/22   [provider]  ondansetron  (ZOFRAN -ODT) 4 MG disintegrating tablet Take 1 tablet (4 mg total) by mouth every 8 (eight) hours as needed for nausea or vomiting. 11/12/22   Long, Shereen Dike, MD  pantoprazole  (PROTONIX ) 40 MG tablet Take 1 tablet (40 mg total) by mouth daily. 11/12/22 12/12/22  Long, Shereen Dike, MD    Family History Family History  Problem Relation Age of Onset   Hypertension Mother    Cancer Mother    Heart disease Father    Heart attack Father 25   Hyperlipidemia Father    Hypertension Father    Diabetes Sister    Diabetes Brother     Social History Social History   Tobacco Use   Smoking status: Every Day    Current packs/day: 0.25     Average packs/day: 0.3 packs/day for 30.0 years (7.5 ttl pk-yrs)    Types: Cigarettes   Smokeless tobacco: Never   Tobacco comments:    3 cigarettes per day (after meals)  Vaping Use   Vaping status: Every Day  Substance Use Topics   Alcohol use: No    Comment: occasionally    Drug use: Yes    Types: Marijuana, Cocaine    Comment: cocaine, night before last     Allergies   Mushroom extract complex (obsolete) and Penicillins   Review of Systems Review of Systems  Respiratory:  Positive for cough.    Per HPI  Physical Exam Triage Vital Signs ED Triage Vitals  Encounter Vitals Group  BP 01/27/24 1150 115/75     Systolic BP Percentile --      Diastolic BP Percentile --      Pulse Rate 01/27/24 1150 68     Resp 01/27/24 1150 17     Temp 01/27/24 1150 97.8 F (36.6 C)     Temp Source 01/27/24 1150 Oral     SpO2 01/27/24 1150 96 %     Weight --      Height --      Head Circumference --      Peak Flow --      Pain Score 01/27/24 1148 8     Pain Loc --      Pain Education --      Exclude from Growth Chart --    No data found.  Updated Vital Signs BP 115/75 (BP Location: Left Arm)   Pulse 68   Temp 97.8 F (36.6 C) (Oral)   Resp 17   SpO2 96%   Visual Acuity Right Eye Distance:   Left Eye Distance:   Bilateral Distance:    Right Eye Near:   Left Eye Near:    Bilateral Near:     Physical Exam Vitals and nursing note reviewed.  Constitutional:      General: He is awake. He is not in acute distress.    Appearance: Normal appearance. He is well-developed and well-groomed. He is not ill-appearing.  HENT:     Right Ear: Ear canal and external ear normal. Tympanic membrane is erythematous.     Left Ear: Tympanic membrane, ear canal and external ear normal.     Nose: Congestion and rhinorrhea present.     Mouth/Throat:     Mouth: Mucous membranes are moist.     Pharynx: Posterior oropharyngeal erythema present.  Cardiovascular:     Rate and Rhythm:  Normal rate and regular rhythm.  Pulmonary:     Effort: Pulmonary effort is normal.     Breath sounds: Normal breath sounds.  Skin:    General: Skin is warm and dry.  Neurological:     Mental Status: He is alert.  Psychiatric:        Behavior: Behavior is cooperative.      UC Treatments / Results  Labs (all labs ordered are listed, but only abnormal results are displayed) Labs Reviewed - No data to display  EKG   Radiology DG Chest 2 View Result Date: 01/25/2024 CLINICAL DATA:  Cough EXAM: CHEST - 2 VIEW COMPARISON:  11/24/2023, 11/12/2022 FINDINGS: Emphysema with large right greater than left apical bulla. No acute airspace disease, pleural effusion or pneumothorax. Stable cardiomediastinal silhouette IMPRESSION: No active cardiopulmonary disease. Emphysema with large apical bulla. Electronically Signed   By: Esmeralda Hedge M.D.   On: 01/25/2024 16:13    Procedures Procedures (including critical care time)  Medications Ordered in UC Medications - No data to display  Initial Impression / Assessment and Plan / UC Course  I have reviewed the triage vital signs and the nursing notes.  Pertinent labs & imaging results that were available during my care of the patient were reviewed by me and considered in my medical decision making (see chart for details).     Patient is well-appearing.  Vitals are stable.  Mild congestion and rhinorrhea present, mild erythema noted to pharynx with PND.  Mild erythema noted to right TM without obvious signs of infection.  Prescribe Tessalon  Promethazine  DM as needed for cough.  Prescribed azelastine  nasal spray to assist  with congestion.  Recommended continuing Claritin or Zyrtec to help with symptoms.  Recommended Mucinex  as needed for cough and congestion.  Discussed return precautions. Final Clinical Impressions(s) / UC Diagnoses   Final diagnoses:  Viral respiratory illness  Acute cough     Discharge Instructions      I believe  your symptoms are likely related to a viral illness. Take Tessalon  every 8 hours as needed for cough. Take Promethazine  DM cough syrup at night as needed for cough.  This can make you drowsy so do not drive, work, or drink alcohol while taking this. Use azelastine  nasal spray twice daily to assist with congestion. Continue taking Claritin or Zyrtec to assist with symptoms. You can also try taking over-the-counter Mucinex  to help with cough and congestion. Stay hydrated and get some rest. Return here if symptoms persist or worsen.   ED Prescriptions     Medication Sig Dispense Auth. Provider   benzonatate  (TESSALON ) 100 MG capsule Take 1 capsule (100 mg total) by mouth every 8 (eight) hours. 21 capsule Levora Reas A, NP   promethazine -dextromethorphan (PROMETHAZINE -DM) 6.25-15 MG/5ML syrup Take 5 mLs by mouth at bedtime as needed for cough. 118 mL Levora Reas A, NP   azelastine  (ASTELIN ) 0.1 % nasal spray Place 2 sprays into both nostrils 2 (two) times daily. Use in each nostril as directed 30 mL Levora Reas A, NP      PDMP not reviewed this encounter.   Levora Reas A, NP 01/27/24 1320

## 2024-02-10 ENCOUNTER — Encounter: Payer: Self-pay | Admitting: Podiatry

## 2024-02-10 ENCOUNTER — Ambulatory Visit (INDEPENDENT_AMBULATORY_CARE_PROVIDER_SITE_OTHER): Admitting: Podiatry

## 2024-02-10 VITALS — Ht 67.0 in | Wt 210.0 lb

## 2024-02-10 DIAGNOSIS — E1140 Type 2 diabetes mellitus with diabetic neuropathy, unspecified: Secondary | ICD-10-CM | POA: Diagnosis not present

## 2024-02-10 DIAGNOSIS — M2142 Flat foot [pes planus] (acquired), left foot: Secondary | ICD-10-CM

## 2024-02-10 DIAGNOSIS — M2141 Flat foot [pes planus] (acquired), right foot: Secondary | ICD-10-CM

## 2024-02-10 DIAGNOSIS — E1165 Type 2 diabetes mellitus with hyperglycemia: Secondary | ICD-10-CM

## 2024-02-10 DIAGNOSIS — B353 Tinea pedis: Secondary | ICD-10-CM

## 2024-02-10 MED ORDER — GABAPENTIN 300 MG PO CAPS: 300.0000 mg | ORAL_CAPSULE | Freq: Three times a day (TID) | ORAL | 2 refills | Status: DC

## 2024-02-10 MED ORDER — CLOTRIMAZOLE-BETAMETHASONE 1-0.05 % EX CREA: 1.0000 | TOPICAL_CREAM | Freq: Every day | CUTANEOUS | 3 refills | Status: DC

## 2024-02-11 DIAGNOSIS — Z0001 Encounter for general adult medical examination with abnormal findings: Secondary | ICD-10-CM | POA: Diagnosis not present

## 2024-02-11 DIAGNOSIS — I10 Essential (primary) hypertension: Secondary | ICD-10-CM | POA: Diagnosis not present

## 2024-02-11 DIAGNOSIS — J410 Simple chronic bronchitis: Secondary | ICD-10-CM | POA: Diagnosis not present

## 2024-02-11 DIAGNOSIS — Z720 Tobacco use: Secondary | ICD-10-CM | POA: Diagnosis not present

## 2024-02-11 DIAGNOSIS — M1712 Unilateral primary osteoarthritis, left knee: Secondary | ICD-10-CM | POA: Diagnosis not present

## 2024-02-11 DIAGNOSIS — E119 Type 2 diabetes mellitus without complications: Secondary | ICD-10-CM | POA: Diagnosis not present

## 2024-02-11 DIAGNOSIS — E782 Mixed hyperlipidemia: Secondary | ICD-10-CM | POA: Diagnosis not present

## 2024-02-12 NOTE — Progress Notes (Signed)
  Subjective:  Patient ID: Andrew [name], male    DOB: 07-25-1966,  MRN: [id number]  Chief Complaint  Patient presents with   Diabetes    EST diabetic foot exam interested in diabetic shoes    58 y.o. male presents with the above complaint. History confirmed with patient.  Complains of burning stinging pain in both feet.  He thinks his diabetes is well-controlled he does not recall his last A1c.  Also has itching peeling skin on both feet.  Would like to have diabetic shoes made.  Objective:  Physical Exam: warm, good capillary refill, no trophic changes or ulcerative lesions, normal DP and PT pulses, abnormal sensory exam, tinea pedis, and pes planus.  Assessment:   1. Poorly controlled type 2 diabetes mellitus with neuropathy (HCC)   2. Pes planus of both feet   3. Tinea pedis of both feet      Plan:  Patient was evaluated and treated and all questions answered.  Patient educated on diabetes. Discussed proper diabetic foot care and discussed risks and complications of disease. Educated patient in depth on reasons to return to the office immediately should he/she discover anything concerning or new on the feet. All questions answered. Discussed proper shoes as well.  Diabetic shoe referral sent to [facility] information given to him on scheduling  Discussed the etiology and treatment options for tinea pedis.  Discussed topical and oral treatment.  Recommended topical treatment with Lotrisone cream.  This was sent to the patient's pharmacy.  Also discussed appropriate foot hygiene, use of antifungal spray such as Tinactin in shoes, as well as cleaning her foot surfaces such as showers and bathroom floors with bleach.  Discussed treatment options for diabetic peripheral neuropathy discussed medication management he has not been on gabapentin  or Lyrica recently that he knows of.  Rx for gabapentin  300 mg 3 times daily sent to pharmacy we discussed this risk and  possible side effects   Return if symptoms worsen or fail to improve.

## 2024-04-12 DIAGNOSIS — J410 Simple chronic bronchitis: Secondary | ICD-10-CM | POA: Diagnosis not present

## 2024-04-12 DIAGNOSIS — M1712 Unilateral primary osteoarthritis, left knee: Secondary | ICD-10-CM | POA: Diagnosis not present

## 2024-04-12 DIAGNOSIS — I10 Essential (primary) hypertension: Secondary | ICD-10-CM | POA: Diagnosis not present

## 2024-04-12 DIAGNOSIS — Z720 Tobacco use: Secondary | ICD-10-CM | POA: Diagnosis not present

## 2024-04-12 DIAGNOSIS — E119 Type 2 diabetes mellitus without complications: Secondary | ICD-10-CM | POA: Diagnosis not present

## 2024-04-12 DIAGNOSIS — E782 Mixed hyperlipidemia: Secondary | ICD-10-CM | POA: Diagnosis not present

## 2024-06-17 ENCOUNTER — Other Ambulatory Visit: Payer: Self-pay | Admitting: Podiatry

## 2024-07-03 ENCOUNTER — Emergency Department (HOSPITAL_COMMUNITY)

## 2024-07-03 ENCOUNTER — Observation Stay (HOSPITAL_COMMUNITY)
Admission: EM | Admit: 2024-07-03 | Discharge: 2024-07-07 | Disposition: A | Discharge status: Home / Self Care | Attending: Internal Medicine | Admitting: Internal Medicine

## 2024-07-03 ENCOUNTER — Encounter (HOSPITAL_COMMUNITY): Payer: Self-pay

## 2024-07-03 DIAGNOSIS — Z6832 Body mass index (BMI) 32.0-32.9, adult: Secondary | ICD-10-CM | POA: Diagnosis not present

## 2024-07-03 DIAGNOSIS — R1084 Generalized abdominal pain: Secondary | ICD-10-CM | POA: Diagnosis not present

## 2024-07-03 DIAGNOSIS — K5900 Constipation, unspecified: Secondary | ICD-10-CM | POA: Diagnosis not present

## 2024-07-03 DIAGNOSIS — E1142 Type 2 diabetes mellitus with diabetic polyneuropathy: Secondary | ICD-10-CM | POA: Insufficient documentation

## 2024-07-03 DIAGNOSIS — K920 Hematemesis: Secondary | ICD-10-CM | POA: Diagnosis not present

## 2024-07-03 DIAGNOSIS — F22 Delusional disorders: Secondary | ICD-10-CM | POA: Diagnosis not present

## 2024-07-03 DIAGNOSIS — K2100 Gastro-esophageal reflux disease with esophagitis, without bleeding: Secondary | ICD-10-CM | POA: Diagnosis not present

## 2024-07-03 DIAGNOSIS — F17210 Nicotine dependence, cigarettes, uncomplicated: Secondary | ICD-10-CM | POA: Diagnosis not present

## 2024-07-03 DIAGNOSIS — G9341 Metabolic encephalopathy: Secondary | ICD-10-CM | POA: Diagnosis not present

## 2024-07-03 DIAGNOSIS — Z79899 Other long term (current) drug therapy: Secondary | ICD-10-CM | POA: Diagnosis not present

## 2024-07-03 DIAGNOSIS — E1165 Type 2 diabetes mellitus with hyperglycemia: Secondary | ICD-10-CM | POA: Insufficient documentation

## 2024-07-03 DIAGNOSIS — Z91148 Patient's other noncompliance with medication regimen for other reason: Secondary | ICD-10-CM | POA: Diagnosis not present

## 2024-07-03 DIAGNOSIS — Z794 Long term (current) use of insulin: Secondary | ICD-10-CM | POA: Diagnosis not present

## 2024-07-03 DIAGNOSIS — F1410 Cocaine abuse, uncomplicated: Secondary | ICD-10-CM | POA: Diagnosis not present

## 2024-07-03 DIAGNOSIS — F1290 Cannabis use, unspecified, uncomplicated: Secondary | ICD-10-CM | POA: Insufficient documentation

## 2024-07-03 DIAGNOSIS — Z91119 Patient's noncompliance with dietary regimen due to unspecified reason: Secondary | ICD-10-CM | POA: Insufficient documentation

## 2024-07-03 DIAGNOSIS — R7889 Finding of other specified substances, not normally found in blood: Secondary | ICD-10-CM

## 2024-07-03 DIAGNOSIS — F17200 Nicotine dependence, unspecified, uncomplicated: Secondary | ICD-10-CM | POA: Diagnosis not present

## 2024-07-03 DIAGNOSIS — E871 Hypo-osmolality and hyponatremia: Secondary | ICD-10-CM | POA: Insufficient documentation

## 2024-07-03 DIAGNOSIS — R739 Hyperglycemia, unspecified: Principal | ICD-10-CM

## 2024-07-03 DIAGNOSIS — K429 Umbilical hernia without obstruction or gangrene: Secondary | ICD-10-CM | POA: Diagnosis not present

## 2024-07-03 DIAGNOSIS — N3000 Acute cystitis without hematuria: Secondary | ICD-10-CM | POA: Diagnosis not present

## 2024-07-03 DIAGNOSIS — K209 Esophagitis, unspecified without bleeding: Secondary | ICD-10-CM

## 2024-07-03 DIAGNOSIS — N179 Acute kidney failure, unspecified: Secondary | ICD-10-CM | POA: Insufficient documentation

## 2024-07-03 DIAGNOSIS — R1111 Vomiting without nausea: Secondary | ICD-10-CM | POA: Diagnosis not present

## 2024-07-03 DIAGNOSIS — R531 Weakness: Secondary | ICD-10-CM | POA: Insufficient documentation

## 2024-07-03 DIAGNOSIS — I499 Cardiac arrhythmia, unspecified: Secondary | ICD-10-CM | POA: Diagnosis not present

## 2024-07-03 DIAGNOSIS — R270 Ataxia, unspecified: Secondary | ICD-10-CM | POA: Insufficient documentation

## 2024-07-03 DIAGNOSIS — Z5900 Homelessness unspecified: Secondary | ICD-10-CM | POA: Insufficient documentation

## 2024-07-03 DIAGNOSIS — R000 Tachycardia, unspecified: Secondary | ICD-10-CM | POA: Diagnosis not present

## 2024-07-03 DIAGNOSIS — E1100 Type 2 diabetes mellitus with hyperosmolarity without nonketotic hyperglycemic-hyperosmolar coma (NKHHC): Secondary | ICD-10-CM | POA: Diagnosis not present

## 2024-07-03 DIAGNOSIS — E669 Obesity, unspecified: Secondary | ICD-10-CM | POA: Insufficient documentation

## 2024-07-03 DIAGNOSIS — E66811 Obesity, class 1: Secondary | ICD-10-CM | POA: Insufficient documentation

## 2024-07-03 DIAGNOSIS — K2101 Gastro-esophageal reflux disease with esophagitis, with bleeding: Secondary | ICD-10-CM | POA: Diagnosis not present

## 2024-07-03 DIAGNOSIS — G629 Polyneuropathy, unspecified: Secondary | ICD-10-CM

## 2024-07-03 DIAGNOSIS — N390 Urinary tract infection, site not specified: Secondary | ICD-10-CM | POA: Diagnosis present

## 2024-07-03 LAB — CBC WITH DIFFERENTIAL/PLATELET
Abs Immature Granulocytes: 0.04 K/uL (ref 0.00–0.07)
Basophils Absolute: 0.1 K/uL (ref 0.0–0.1)
Basophils Relative: 1 %
Eosinophils Absolute: 0 K/uL (ref 0.0–0.5)
Eosinophils Relative: 0 %
HCT: 42.6 % (ref 39.0–52.0)
Hemoglobin: 14.1 g/dL (ref 13.0–17.0)
Immature Granulocytes: 0 %
Lymphocytes Relative: 13 %
Lymphs Abs: 1.3 K/uL (ref 0.7–4.0)
MCH: 30.5 pg (ref 26.0–34.0)
MCHC: 33.1 g/dL (ref 30.0–36.0)
MCV: 92.2 fL (ref 80.0–100.0)
Monocytes Absolute: 0.9 K/uL (ref 0.1–1.0)
Monocytes Relative: 9 %
Neutro Abs: 7.5 K/uL (ref 1.7–7.7)
Neutrophils Relative %: 77 %
Platelets: 381 K/uL (ref 150–400)
RBC: 4.62 MIL/uL (ref 4.22–5.81)
RDW: 12.3 % (ref 11.5–15.5)
WBC: 9.7 K/uL (ref 4.0–10.5)
nRBC: 0 % (ref 0.0–0.2)

## 2024-07-03 LAB — CBG MONITORING, ED
Glucose-Capillary: >600 mg/dL (ref 70–99)
Glucose-Capillary: >600 mg/dL (ref 70–99)
Glucose-Capillary: >600 mg/dL (ref 70–99)
Glucose-Capillary: 552 mg/dL (ref 70–99)
Glucose-Capillary: 560 mg/dL (ref 70–99)
Glucose-Capillary: 386 mg/dL — ABNORMAL HIGH (ref 70–99)
Glucose-Capillary: 388 mg/dL — ABNORMAL HIGH (ref 70–99)
Glucose-Capillary: 271 mg/dL — ABNORMAL HIGH (ref 70–99)
Glucose-Capillary: 420 mg/dL — ABNORMAL HIGH (ref 70–99)
Glucose-Capillary: 215 mg/dL — ABNORMAL HIGH (ref 70–99)
Glucose-Capillary: 161 mg/dL — ABNORMAL HIGH (ref 70–99)
Glucose-Capillary: 145 mg/dL — ABNORMAL HIGH (ref 70–99)
Glucose-Capillary: 110 mg/dL — ABNORMAL HIGH (ref 70–99)
Glucose-Capillary: 158 mg/dL — ABNORMAL HIGH (ref 70–99)
Glucose-Capillary: 160 mg/dL — ABNORMAL HIGH (ref 70–99)

## 2024-07-03 LAB — I-STAT VENOUS BLOOD GAS, ED
Acid-base deficit: 1 mmol/L (ref 0.0–2.0)
Bicarbonate: 24.8 mmol/L (ref 20.0–28.0)
Calcium, Ion: 1.04 mmol/L — ABNORMAL LOW (ref 1.15–1.40)
HCT: 45 % (ref 39.0–52.0)
Hemoglobin: 15.3 g/dL (ref 13.0–17.0)
O2 Saturation: 90 %
Potassium: 4.9 mmol/L (ref 3.5–5.1)
Sodium: 117 mmol/L — CL (ref 135–145)
TCO2: 26 mmol/L (ref 22–32)
pCO2, Ven: 44.3 mmHg (ref 44–60)
pH, Ven: 7.356 (ref 7.25–7.43)
pO2, Ven: 62 mmHg — ABNORMAL HIGH (ref 32–45)

## 2024-07-03 LAB — GLUCOSE, CAPILLARY
Glucose-Capillary: 265 mg/dL — ABNORMAL HIGH (ref 70–99)
Glucose-Capillary: 299 mg/dL — ABNORMAL HIGH (ref 70–99)

## 2024-07-03 LAB — BASIC METABOLIC PANEL WITH GFR
Anion gap: 19 — ABNORMAL HIGH (ref 5–15)
Anion gap: 14 (ref 5–15)
BUN: 47 mg/dL — ABNORMAL HIGH (ref 6–20)
BUN: 36 mg/dL — ABNORMAL HIGH (ref 6–20)
CO2: 19 mmol/L — ABNORMAL LOW (ref 22–32)
CO2: 22 mmol/L (ref 22–32)
Calcium: 8.6 mg/dL — ABNORMAL LOW (ref 8.9–10.3)
Calcium: 9.3 mg/dL (ref 8.9–10.3)
Chloride: 78 mmol/L — ABNORMAL LOW (ref 98–111)
Chloride: 93 mmol/L — ABNORMAL LOW (ref 98–111)
Creatinine, Ser: 2.17 mg/dL — ABNORMAL HIGH (ref 0.61–1.24)
Creatinine, Ser: 1.44 mg/dL — ABNORMAL HIGH (ref 0.61–1.24)
GFR, Estimated: 34 mL/min — ABNORMAL LOW (ref >60)
GFR, Estimated: 56 mL/min — ABNORMAL LOW (ref >60)
Glucose, Bld: 1092 mg/dL (ref 70–99)
Glucose, Bld: 393 mg/dL — ABNORMAL HIGH (ref 70–99)
Potassium: 4.7 mmol/L (ref 3.5–5.1)
Potassium: 4.1 mmol/L (ref 3.5–5.1)
Sodium: 116 mmol/L — CL (ref 135–145)
Sodium: 129 mmol/L — ABNORMAL LOW (ref 135–145)

## 2024-07-03 LAB — HEMOGLOBIN AND HEMATOCRIT, BLOOD
HCT: 37.8 % — ABNORMAL LOW (ref 39.0–52.0)
HCT: 41.5 % (ref 39.0–52.0)
Hemoglobin: 12.8 g/dL — ABNORMAL LOW (ref 13.0–17.0)
Hemoglobin: 14.1 g/dL (ref 13.0–17.0)

## 2024-07-03 LAB — HEPATIC FUNCTION PANEL
ALT: 13 U/L (ref 0–44)
AST: 23 U/L (ref 15–41)
Albumin: 3.4 g/dL — ABNORMAL LOW (ref 3.5–5.0)
Alkaline Phosphatase: 92 U/L (ref 38–126)
Bilirubin, Direct: 0.2 mg/dL (ref 0.0–0.2)
Indirect Bilirubin: 0.8 mg/dL (ref 0.3–0.9)
Total Bilirubin: 1 mg/dL (ref 0.0–1.2)
Total Protein: 7.3 g/dL (ref 6.5–8.1)

## 2024-07-03 LAB — I-STAT CHEM 8, ED
BUN: 53 mg/dL — ABNORMAL HIGH (ref 6–20)
Calcium, Ion: 1.04 mmol/L — ABNORMAL LOW (ref 1.15–1.40)
Chloride: 84 mmol/L — ABNORMAL LOW (ref 98–111)
Creatinine, Ser: 2 mg/dL — ABNORMAL HIGH (ref 0.61–1.24)
Glucose, Bld: >700 mg/dL (ref 70–99)
HCT: 45 % (ref 39.0–52.0)
Hemoglobin: 15.3 g/dL (ref 13.0–17.0)
Potassium: 4.9 mmol/L (ref 3.5–5.1)
Sodium: 118 mmol/L — CL (ref 135–145)
TCO2: 23 mmol/L (ref 22–32)

## 2024-07-03 LAB — URINALYSIS, ROUTINE W REFLEX MICROSCOPIC
Bilirubin Urine: NEGATIVE
Glucose, UA: >=500 mg/dL — AB
Hgb urine dipstick: NEGATIVE
Ketones, ur: 5 mg/dL — AB
Nitrite: NEGATIVE
Protein, ur: NEGATIVE mg/dL
Specific Gravity, Urine: 1.023 (ref 1.005–1.030)
pH: 5 (ref 5.0–8.0)

## 2024-07-03 LAB — CK: Total CK: 67 U/L (ref 49–397)

## 2024-07-03 LAB — BETA-HYDROXYBUTYRIC ACID: Beta-Hydroxybutyric Acid: 2.85 mmol/L — ABNORMAL HIGH (ref 0.05–0.27)

## 2024-07-03 LAB — HIV ANTIBODY (ROUTINE TESTING W REFLEX): HIV Screen 4th Generation wRfx: NONREACTIVE

## 2024-07-03 MED ORDER — DEXTROSE IN LACTATED RINGERS 5 % IV SOLN: INTRAVENOUS | Status: DC

## 2024-07-03 MED ORDER — INSULIN REGULAR(HUMAN) IN NACL 100-0.9 UT/100ML-% IV SOLN
INTRAVENOUS | Status: DC
  Administered 2024-07-03: 13 [IU]/h via INTRAVENOUS
  Filled 2024-07-03: qty 100

## 2024-07-03 MED ORDER — SODIUM CHLORIDE 0.9 % IV SOLN: INTRAVENOUS | Status: DC

## 2024-07-03 MED ORDER — IOHEXOL 350 MG/ML SOLN
75.0000 mL | Freq: Once | INTRAVENOUS | Status: AC | PRN
  Administered 2024-07-03: 75 mL via INTRAVENOUS

## 2024-07-03 MED ORDER — NICOTINE 14 MG/24HR TD PT24
14.0000 mg | MEDICATED_PATCH | Freq: Every day | TRANSDERMAL | Status: DC
Start: 2024-07-03 — End: 2024-07-07
  Administered 2024-07-03 – 2024-07-07 (×5): 14 mg via TRANSDERMAL
  Filled 2024-07-03 (×5): qty 1

## 2024-07-03 MED ORDER — LACTATED RINGERS IV BOLUS
1000.0000 mL | Freq: Once | INTRAVENOUS | Status: AC
  Administered 2024-07-03: 1000 mL via INTRAVENOUS

## 2024-07-03 MED ORDER — LACTATED RINGERS IV SOLN: INTRAVENOUS | Status: DC

## 2024-07-03 MED ORDER — INSULIN GLARGINE 100 UNIT/ML ~~LOC~~ SOLN
12.0000 [IU] | Freq: Two times a day (BID) | SUBCUTANEOUS | Status: DC
Start: 2024-07-03 — End: 2024-07-04
  Administered 2024-07-03 (×2): 12 [IU] via SUBCUTANEOUS
  Filled 2024-07-03 (×5): qty 0.12

## 2024-07-03 MED ORDER — HEPARIN SODIUM (PORCINE) 5000 UNIT/ML IJ SOLN: 5000.0000 [IU] | Freq: Three times a day (TID) | INTRAMUSCULAR | Status: DC

## 2024-07-03 MED ORDER — INSULIN ASPART 100 UNIT/ML IJ SOLN
12.0000 [IU] | Freq: Once | INTRAMUSCULAR | Status: AC
  Administered 2024-07-03: 12 [IU] via SUBCUTANEOUS

## 2024-07-03 MED ORDER — SODIUM CHLORIDE 0.9% FLUSH
3.0000 mL | Freq: Two times a day (BID) | INTRAVENOUS | Status: DC
  Administered 2024-07-03 – 2024-07-06 (×7): 3 mL via INTRAVENOUS

## 2024-07-03 MED ORDER — ALBUTEROL SULFATE (2.5 MG/3ML) 0.083% IN NEBU: 2.5000 mg | INHALATION_SOLUTION | Freq: Four times a day (QID) | RESPIRATORY_TRACT | Status: DC | PRN

## 2024-07-03 MED ORDER — HYDRALAZINE HCL 20 MG/ML IJ SOLN: 10.0000 mg | INTRAMUSCULAR | Status: DC | PRN

## 2024-07-03 MED ORDER — PANTOPRAZOLE SODIUM 40 MG IV SOLR
40.0000 mg | Freq: Two times a day (BID) | INTRAVENOUS | Status: DC
  Administered 2024-07-03 – 2024-07-07 (×9): 40 mg via INTRAVENOUS
  Filled 2024-07-03 (×9): qty 10

## 2024-07-03 MED ORDER — INSULIN ASPART 100 UNIT/ML IJ SOLN
0.0000 [IU] | INTRAMUSCULAR | Status: DC
  Administered 2024-07-03: 5 [IU] via SUBCUTANEOUS
  Administered 2024-07-04: 7 [IU] via SUBCUTANEOUS
  Administered 2024-07-04: 2 [IU] via SUBCUTANEOUS
  Administered 2024-07-04: 5 [IU] via SUBCUTANEOUS

## 2024-07-03 MED ORDER — LACTATED RINGERS IV BOLUS
20.0000 mL/kg | Freq: Once | INTRAVENOUS | Status: AC
  Administered 2024-07-03: 1906 mL via INTRAVENOUS

## 2024-07-03 MED ORDER — ACETAMINOPHEN 650 MG RE SUPP: 650.0000 mg | Freq: Four times a day (QID) | RECTAL | Status: DC | PRN

## 2024-07-03 MED ORDER — DEXTROSE 50 % IV SOLN: 0.0000 mL | INTRAVENOUS | Status: DC | PRN

## 2024-07-03 MED ORDER — POTASSIUM CHLORIDE 10 MEQ/100ML IV SOLN
10.0000 meq | INTRAVENOUS | Status: AC
  Filled 2024-07-03: qty 100

## 2024-07-03 MED ORDER — METOCLOPRAMIDE HCL 5 MG/ML IJ SOLN
10.0000 mg | Freq: Once | INTRAMUSCULAR | Status: AC
  Administered 2024-07-03: 10 mg via INTRAVENOUS
  Filled 2024-07-03: qty 2

## 2024-07-03 MED ORDER — ACETAMINOPHEN 325 MG PO TABS
650.0000 mg | ORAL_TABLET | Freq: Four times a day (QID) | ORAL | Status: DC | PRN
  Administered 2024-07-06 – 2024-07-07 (×2): 650 mg via ORAL
  Filled 2024-07-03 (×2): qty 2

## 2024-07-03 MED ORDER — SODIUM CHLORIDE 0.9 % IV SOLN
1.0000 g | INTRAVENOUS | Status: AC
  Administered 2024-07-03 – 2024-07-05 (×3): 1 g via INTRAVENOUS
  Filled 2024-07-03 (×3): qty 10

## 2024-07-03 NOTE — ED Provider Notes (Signed)
 Patient signed out to me by PA Andrew [name].  Workup suggestive of critical hyperglycemia versus early DKA.  Patient not taking his insulin .  Also vomiting and abdominal pain.  CT scan showing esophagitis.  Plan is to admit to the hospital service. Physical Exam  BP (!) 137/93   Pulse 95   Temp 98.5 F (36.9 C) (Oral)   Resp 14   Ht 5' 7 (1.702 m)   Wt 95.3 kg Comment: Wt from 02/2024  SpO2 100%   BMI 32.91 kg/m   Physical Exam  Procedures  Procedures  ED Course / MDM   Clinical Course as of 07/03/24 0755  Sat Jul 03, 2024  0630 Possible early DKA. Not taking insulin  for 2 days. CT scan showing esophagitis. Has been vomiting. Admit.  [JR]    Clinical Course User Index [JR] Andrew [name]   Medical Decision Making Amount and/or Complexity of Data Reviewed Labs: ordered. Radiology: ordered.  Risk Prescription drug management. Decision regarding hospitalization.   Admitted to hospital service with Dr. Wes.  She recommended twice daily IV Protonix  which I ordered.  He remains hemodynamically stable and well-appearing at this time on room air.       Andrew [name] 07/03/24 0757    Andrew [name] 07/09/24 [phone]

## 2024-07-03 NOTE — ED Notes (Signed)
 The pt wants some food  dr claudene just changed his diet order

## 2024-07-03 NOTE — ED Provider Notes (Signed)
 [place] EMERGENCY DEPARTMENT AT [facility] Provider Note   CSN: [id number] Arrival date & time: 07/03/24  0246     Patient presents with: Hyperglycemia   Andrew [name] is a 58 y.o. male.  Patient presents to the emergency room with a chief complaint of feeling off for the past 2 days.  Past medical history significant for type II DM with insulin  usage, drug overdose, GERD.  EMS transported the patient and reported an initial CBG of 575.  Patient reports nausea, vomiting, feeling off, feeling this 1 may have drugged him.  He does endorse being out of his insulin  and other diabetic medications for at least the past 2 days.  He was treated with 4 mg of Zofran  during transport.  He does report a small amount of hematemesis with his emesis.  He denies chest pain, shortness of breath.  He states he feels thirsty.  He does not tell me why he was out of medications, whether it was a refill issue or just not taking them.  Patient also endorsed some generalized abdominal pain.  He says he is concerned that his appendix may be bad.    Hyperglycemia      Prior to Admission medications   Medication Sig Start Date End Date Taking? Authorizing Provider  albuterol  (VENTOLIN  HFA) 108 (90 Base) MCG/ACT inhaler Inhale 2 puffs into the lungs every 6 (six) hours as needed for wheezing or shortness of breath. 01/22/22   [provider]  azelastine  (ASTELIN ) 0.1 % nasal spray Place 2 sprays into both nostrils 2 (two) times daily. Use in each nostril as directed 01/27/24   Johnie Flaming A, NP  benzonatate  (TESSALON ) 100 MG capsule Take 1 capsule (100 mg total) by mouth every 8 (eight) hours. 01/27/24   Johnie Flaming A, NP  clotrimazole -betamethasone  (LOTRISONE ) cream Apply 1 Application topically daily. 02/10/24   McDonald, Juliene SAUNDERS, DPM  dicyclomine  (BENTYL ) 20 MG tablet Take 1 tablet (20 mg total) by mouth 3 (three) times daily as needed for spasms. 11/12/22   Long, Fonda MATSU, MD   gabapentin  (NEURONTIN ) 300 MG capsule TAKE 1 CAPSULE BY MOUTH THREE TIMES A DAY 06/17/24   McDonald, Juliene SAUNDERS, DPM  glipiZIDE  (GLUCOTROL ) 5 MG tablet Take 1 tablet (5 mg total) by mouth daily before breakfast. 10/17/22   Desiderio Fonda, PA-C  hydrochlorothiazide  (HYDRODIURIL ) 25 MG tablet Take 1 tablet (25 mg total) by mouth daily. 11/12/22 12/12/22  LongFonda MATSU, MD  LANTUS  SOLOSTAR 100 UNIT/ML Solostar Pen SMARTSIG:15 Unit(s) SUB-Q Daily 01/22/24   [provider]  LEVEMIR FLEXPEN 100 UNIT/ML FlexPen Inject 10 Units into the skin at bedtime. 01/15/22   [provider]  losartan (COZAAR) 25 MG tablet Take 25 mg by mouth daily. 11/27/23   [provider]  metoprolol  succinate (TOPROL -XL) 25 MG 24 hr tablet Take 25 mg by mouth daily. 01/16/24   [provider]  ondansetron  (ZOFRAN -ODT) 4 MG disintegrating tablet Take 1 tablet (4 mg total) by mouth every 8 (eight) hours as needed for nausea or vomiting. 11/12/22   Long, Fonda MATSU, MD  pantoprazole  (PROTONIX ) 40 MG tablet Take 1 tablet (40 mg total) by mouth daily. 11/12/22 12/12/22  Darra Fonda MATSU, MD  promethazine -dextromethorphan (PROMETHAZINE -DM) 6.25-15 MG/5ML syrup Take 5 mLs by mouth at bedtime as needed for cough. 01/27/24   Johnie Flaming LABOR, NP    Allergies: Mushroom extract complex (obsolete) and Penicillins    Review of Systems  Updated Vital Signs BP ROLLEN)  137/93   Pulse 95   Temp 98.5 F (36.9 C) (Oral)   Resp 14   Ht 5' 7 (1.702 m)   Wt 95.3 kg Comment: Wt from 02/2024  SpO2 100%   BMI 32.91 kg/m   Physical Exam Vitals and nursing note reviewed.  Constitutional:      General: He is not in acute distress.    Appearance: He is well-developed.  HENT:     Head: Normocephalic and atraumatic.  Eyes:     Conjunctiva/sclera: Conjunctivae normal.  Cardiovascular:     Rate and Rhythm: Normal rate and regular rhythm.  Pulmonary:     Effort: Pulmonary effort is normal. No respiratory distress.     Breath  sounds: Normal breath sounds.  Abdominal:     Palpations: Abdomen is soft.     Tenderness: There is abdominal tenderness.     Comments: Generalized mild abdominal tenderness to palpation with no focal tenderness  Musculoskeletal:        General: No swelling.     Cervical back: Neck supple.  Skin:    General: Skin is warm and dry.     Capillary Refill: Capillary refill takes less than 2 seconds.  Neurological:     Mental Status: He is alert.  Psychiatric:        Mood and Affect: Mood normal.     (all labs ordered are listed, but only abnormal results are displayed) Labs Reviewed  BASIC METABOLIC PANEL WITH GFR - Abnormal; Notable for the following components:      Result Value   Sodium 116 (*)    Chloride 78 (*)    CO2 19 (*)    Glucose, Bld 1,092 (*)    BUN 47 (*)    Creatinine, Ser 2.17 (*)    Calcium  8.6 (*)    GFR, Estimated 34 (*)    Anion gap 19 (*)    All other components within normal limits  URINALYSIS, ROUTINE W REFLEX MICROSCOPIC - Abnormal; Notable for the following components:   Color, Urine STRAW (*)    Glucose, UA >=500 (*)    Ketones, ur 5 (*)    Leukocytes,Ua SMALL (*)    Bacteria, UA RARE (*)    All other components within normal limits  BETA-HYDROXYBUTYRIC ACID - Abnormal; Notable for the following components:   Beta-Hydroxybutyric Acid 2.85 (*)    All other components within normal limits  CBG MONITORING, ED - Abnormal; Notable for the following components:   Glucose-Capillary >600 (*)    All other components within normal limits  CBG MONITORING, ED - Abnormal; Notable for the following components:   Glucose-Capillary >600 (*)    All other components within normal limits  I-STAT VENOUS BLOOD GAS, ED - Abnormal; Notable for the following components:   pO2, Ven 62 (*)    Sodium 117 (*)    Calcium , Ion 1.04 (*)    All other components within normal limits  I-STAT CHEM 8, ED - Abnormal; Notable for the following components:   Sodium 118 (*)     Chloride 84 (*)    BUN 53 (*)    Creatinine, Ser 2.00 (*)    Glucose, Bld >700 (*)    Calcium , Ion 1.04 (*)    All other components within normal limits  CBC WITH DIFFERENTIAL/PLATELET    EKG: None  Radiology: CT ABDOMEN PELVIS W CONTRAST Result Date: 07/03/2024 CLINICAL DATA:  58 year old male with abdominal pain, nausea vomiting. EXAM: CT ABDOMEN AND PELVIS WITH  CONTRAST TECHNIQUE: Multidetector CT imaging of the abdomen and pelvis was performed using the standard protocol following bolus administration of intravenous contrast. RADIATION DOSE REDUCTION: This exam was performed according to the departmental dose-optimization program which includes automated exposure control, adjustment of the mA and/or kV according to patient size and/or use of iterative reconstruction technique. CONTRAST:  75mL OMNIPAQUE  IOHEXOL  350 MG/ML SOLN COMPARISON:  CT abdomen 10/02/2017. FINDINGS: Lower chest: Thickened and indistinct visible thoracic esophagus (series 3, images 2-12), with similar thickened and indistinct appearance of the gastroesophageal junction. Normal heart size. No pericardial or pleural effusion. Mild symmetric and dependent lung base atelectasis. Hepatobiliary: Liver and gallbladder appear negative. Pancreas: Negative. Spleen: Diminutive, negative. Adrenals/Urinary Tract: Negative adrenal glands. Nonobstructed kidneys with scattered asymmetric areas of renal cortical scarring (posterior midpole on the left series 3, image 34). Otherwise normal renal enhancement and contrast excretion. Diminutive ureters. Unremarkable bladder. Pelvic phleboliths. No urinary calculus identified. Stomach/Bowel: Nondilated large and small bowel. Mild retained stool throughout the large bowel. Normal appendix coronal image 58. Small volume retained fluid in the stomach. Distal to the gastroesophageal junction the stomach appears negative. Negative duodenum. No pneumoperitoneum. No free fluid or mesenteric inflammation  identified. Small fat containing umbilical hernia on series 3, image 52. No herniated bowel. No associated inflammation. Vascular/Lymphatic: Major arterial structures, portal venous system appear patent and negative. No lymphadenopathy. Reproductive: Negative. Other: No pelvis free fluid. Musculoskeletal: No acute osseous abnormality identified. Lower thoracic flowing endplate osteophytes with occasional associated interbody ankylosis. IMPRESSION: 1. Thickened and indistinct distal esophagus and GE junction compatible with Esophagitis. 2. No other acute or inflammatory process identified in the abdomen or pelvis. Normal appendix. Small fat containing umbilical hernia. Electronically Signed   By: VEAR Hurst M.D.   On: 07/03/2024 06:02     .Critical Care  Performed by: Logan Ubaldo NOVAK, PA-C Authorized by: Logan Ubaldo NOVAK, PA-C   Critical care provider statement:    Critical care time (minutes):  30   Critical care time was exclusive of:  Separately billable procedures and treating other patients   Critical care was necessary to treat or prevent imminent or life-threatening deterioration of the following conditions:  Endocrine crisis (Glucose greater than 1000, concern for early DKA)   Critical care was time spent personally by me on the following activities:  Development of treatment plan with patient or surrogate, discussions with consultants, evaluation of patient's response to treatment, examination of patient, ordering and review of laboratory studies, ordering and review of radiographic studies, ordering and performing treatments and interventions, pulse oximetry, re-evaluation of patient's condition and review of old charts   Care discussed with: admitting provider      Medications Ordered in the ED  lactated ringers  bolus 1,906 mL (has no administration in time range)  insulin  regular, human (MYXREDLIN) 100 units/ 100 mL infusion (has no administration in time range)  lactated ringers   infusion (has no administration in time range)  dextrose  5 % in lactated ringers  infusion (has no administration in time range)  dextrose  50 % solution 0-50 mL (has no administration in time range)  potassium chloride  10 mEq in 100 mL IVPB (has no administration in time range)  lactated ringers  bolus 1,000 mL (0 mLs Intravenous Stopped 07/03/24 0429)  insulin  aspart (novoLOG ) injection 12 Units (12 Units Subcutaneous Given 07/03/24 0327)  metoCLOPramide  (REGLAN ) injection 10 mg (10 mg Intravenous Given 07/03/24 0513)  iohexol  (OMNIPAQUE ) 350 MG/ML injection 75 mL (75 mLs Intravenous Contrast Given 07/03/24 0543)  Clinical Course as of 07/03/24 9367  Sat Jul 03, 2024  0630 Possible early DKA. Not taking insulin  for 2 days. CT scan showing esophagitis. Has been vomiting. Admit.  [JR]    Clinical Course User Index [JR] Lang Norleen POUR, PA-C                                 Medical Decision Making Amount and/or Complexity of Data Reviewed Labs: ordered. Radiology: ordered.  Risk Prescription drug management.   This patient presents to the ED for concern of nausea, vomiting, abdominal pain, this involves an extensive number of treatment options, and is a complaint that carries with it a high risk of complications and morbidity.  The differential diagnosis includes hyperglycemia, DKA, HHS, appendicitis, cholecystitis, gastroenteritis, gastritis, others   Co morbidities / Chronic conditions that complicate the patient evaluation  Type II DM   Additional history obtained:  Additional history obtained from EMR   Lab Tests:  I Ordered, and personally interpreted labs.  The pertinent results include: Glucose 1092, sodium reading 116, corrected to the 130s; bicarb 19, gap of 19; creatinine 2.17 (was 0.92 7 months ago), beta hydroxybutyric acid 2.85   Imaging Studies ordered:  I ordered imaging studies including CT abdomen pelvis with contrast I independently visualized and  interpreted imaging which showed  1. Thickened and indistinct distal esophagus and GE junction  compatible with Esophagitis.    2. No other acute or inflammatory process identified in the abdomen  or pelvis. Normal appendix. Small fat containing umbilical hernia.   I agree with the radiologist interpretation   Cardiac Monitoring: / EKG:  The patient was maintained on a cardiac monitor.  I personally viewed and interpreted the cardiac monitored which showed an underlying rhythm of: Sinus tachycardia   Problem List / ED Course / Critical interventions / Medication management   I ordered medication including Reglan , insulin , lactated Ringer 's, potassium Reevaluation of the patient after these medicines showed that the patient slowly improved   Consultations Obtained:  I requested consultation with the medicine team   Social Determinants of Health:  Patient has housing insecurity   Test / Admission - Considered:  Patient with significant hyperglycemia, concerning for early developing DKA with a glucose of greater than 1000, bicarb of less than 20, anion gap elevated.  He does have a normal pH at this time.  Suspect his nausea and vomiting is due to the severe hyperglycemia.  Patient would benefit from admission to the hospital for further management at this time.  Patient care transferred to Norleen Lang, PA-C at shift handoff. Plan on medicine admission.       Final diagnoses:  Hyperglycemia  Elevated beta-hydroxybutyric acid level  Esophagitis    ED Discharge Orders     None          Logan Ubaldo KATHEE DEVONNA 07/03/24 [id number]    [name], MD 07/03/24 [phone]

## 2024-07-03 NOTE — Inpatient Diabetes Management (Signed)
 Inpatient Diabetes Program Recommendations  AACE/ADA: New Consensus Statement on Inpatient Glycemic Control   Target Ranges:  Prepandial:   less than 140 mg/dL      Peak postprandial:   less than 180 mg/dL (1-2 hours)      Critically ill patients:  140 - 180 mg/dL    Latest Reference Range & Units 07/03/24 06:23 07/03/24 07:24 07/03/24 08:04 07/03/24 08:35 07/03/24 09:01  Glucose-Capillary 70 - 99 mg/dL >399 (HH) 447 (HH) 439 (HH) 388 (H) 386 (H)    Latest Reference Range & Units 07/03/24 02:52  Beta-Hydroxybutyric Acid 0.05 - 0.27 mmol/L 2.85 (H)    Latest Reference Range & Units 07/03/24 02:52 07/03/24 03:05  Sodium 135 - 145 mmol/L 135 - 145 mmol/L 116 (LL) 117 (LL) 118 (LL)  Potassium 3.5 - 5.1 mmol/L 3.5 - 5.1 mmol/L 4.7 4.9 4.9  Chloride 98 - 111 mmol/L 78 (L) 84 (L)  CO2 22 - 32 mmol/L 19 (L)   Glucose 70 - 99 mg/dL 8,907 (HH) >299 (HH)  BUN 6 - 20 mg/dL 47 (H) 53 (H)  Creatinine 0.61 - 1.24 mg/dL 7.82 (H) 7.99 (H)  Calcium  8.9 - 10.3 mg/dL 8.6 (L)   Anion gap 5 - 15  19 (H)    Review of Glycemic Control  Diabetes history: DM2 Outpatient Diabetes medications: Glipizide  10mg  daily, Lantus  15 units daily Current orders for Inpatient glycemic control: IV Insulin     Inpatient Diabetes Program Recommendations: Once patient is appropriate to transition off IV Insulin  - Please consider the following:  Lantus  12 units daily (administer 2 hours prior to discontinuing IV insulin )  CBGs/Novolog  0-9 units Q4HRS    Discharge Recommendation:  Other recommendations: Glipizide (if continued outpatient) Long acting recommendations: Insulin  Glargine (LANTUS ) Solostar Pen to be determined  Short acting recommendations:  Correction coverage ONLY Insulin  lispro (HUMALOG) KwikPen  Sensitive Scale.   Hypoglycemia treatment recommendations: GVoke 1mg  Supply/Referral recommendations: Pen needles - standard   Noted consult for Diabetes Coordinator. Diabetes Coordinator is not on  campus over the weekend but available by pager from 8am to 5pm for questions or concerns.    Thanks,  Lavanda Search, RN, MSN, [facility]  Inpatient Diabetes Coordinator  Pager [phone] (8a-5p)

## 2024-07-03 NOTE — ED Notes (Signed)
 Ubaldo, PA notified of 119 Sodium and 1,092 CBG

## 2024-07-03 NOTE — ED Notes (Signed)
 Report called to rn on 5w rm 26

## 2024-07-03 NOTE — ED Notes (Signed)
 Diet order placed  carb modified

## 2024-07-03 NOTE — Hospital Course (Signed)
  58 year old man history of DM type II, GERD presented to emergency department complaining of feeling off for last 2 days, delusion of someone going to drugged him.EMS transported the patient and reported an initial CBG of 575. Patient reports nausea, vomiting, feeling off, feeling this 1 may have drugged him. He does endorse being out of his insulin  and other diabetic medications for at least the past 2 days. He was treated with 4 mg of Zofran  during transport. He does report a small amount of hematemesis with his emesis. He denies chest pain, shortness of breath. He states he feels thirsty. He does not tell me why he was out of medications, whether it was a refill issue or just not taking them. Patient also endorsed some generalized abdominal pain. He says he is concerned that his appendix may be bad.   At presentation to ED patient is hemodynamically stable. POC blood glucose above 600. BMP showing blood glucose 1092, corrected sodium 132, low bicarb 16, elevated anion gap 19, elevated creatinine 2.17 elevated BUN 47.  Elevated beta-hydroxybutyrate 2.85. VBG no evidence of acidosis.  CT abdomen pelvis evidence of esophagitis.  No other intra-abdominal abnormality.  In the ED patient has been initiated on insulin  drip per DKA protocol and received around 2 L of LR bolus.  Hospitalist has been consulted for management of DKA, AKI and esophagitis.  Given patient has esophagitis and patient complained a small amount of blood with vomiting recommended DDB to start IV Protonix .
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
## 2024-07-03 NOTE — ED Notes (Signed)
 Pt wants his appendix to be checked. Stated that he has been vomiting blood. Small amount of dark blood noticed. PA notified

## 2024-07-04 ENCOUNTER — Other Ambulatory Visit: Payer: Self-pay

## 2024-07-04 DIAGNOSIS — N3000 Acute cystitis without hematuria: Secondary | ICD-10-CM | POA: Diagnosis not present

## 2024-07-04 DIAGNOSIS — K2101 Gastro-esophageal reflux disease with esophagitis, with bleeding: Secondary | ICD-10-CM

## 2024-07-04 DIAGNOSIS — E1100 Type 2 diabetes mellitus with hyperosmolarity without nonketotic hyperglycemic-hyperosmolar coma (NKHHC): Secondary | ICD-10-CM | POA: Diagnosis not present

## 2024-07-04 DIAGNOSIS — N179 Acute kidney failure, unspecified: Secondary | ICD-10-CM

## 2024-07-04 LAB — URINE CULTURE

## 2024-07-04 LAB — GLUCOSE, CAPILLARY
Glucose-Capillary: 318 mg/dL — ABNORMAL HIGH (ref 70–99)
Glucose-Capillary: 186 mg/dL — ABNORMAL HIGH (ref 70–99)
Glucose-Capillary: 319 mg/dL — ABNORMAL HIGH (ref 70–99)
Glucose-Capillary: 195 mg/dL — ABNORMAL HIGH (ref 70–99)
Glucose-Capillary: 223 mg/dL — ABNORMAL HIGH (ref 70–99)

## 2024-07-04 LAB — BASIC METABOLIC PANEL WITH GFR
Anion gap: 10 (ref 5–15)
BUN: 17 mg/dL (ref 6–20)
CO2: 21 mmol/L — ABNORMAL LOW (ref 22–32)
Calcium: 8.3 mg/dL — ABNORMAL LOW (ref 8.9–10.3)
Chloride: 104 mmol/L (ref 98–111)
Creatinine, Ser: 0.99 mg/dL (ref 0.61–1.24)
GFR, Estimated: >60 mL/min (ref >60)
Glucose, Bld: 320 mg/dL — ABNORMAL HIGH (ref 70–99)
Potassium: 3.7 mmol/L (ref 3.5–5.1)
Sodium: 135 mmol/L (ref 135–145)

## 2024-07-04 LAB — OSMOLALITY: Osmolality: 338 mosm/kg (ref 275–295)

## 2024-07-04 MED ORDER — INSULIN ASPART 100 UNIT/ML IJ SOLN
4.0000 [IU] | Freq: Three times a day (TID) | INTRAMUSCULAR | Status: DC
  Administered 2024-07-04 (×2): 4 [IU] via SUBCUTANEOUS

## 2024-07-04 MED ORDER — ENOXAPARIN SODIUM 60 MG/0.6ML IJ SOSY
50.0000 mg | PREFILLED_SYRINGE | INTRAMUSCULAR | Status: DC
Start: 2024-07-04 — End: 2024-07-07
  Administered 2024-07-04 – 2024-07-06 (×3): 50 mg via SUBCUTANEOUS
  Filled 2024-07-04 (×3): qty 0.6

## 2024-07-04 MED ORDER — GABAPENTIN 100 MG PO CAPS
100.0000 mg | ORAL_CAPSULE | Freq: Three times a day (TID) | ORAL | Status: DC
Start: 2024-07-04 — End: 2024-07-07
  Administered 2024-07-04 – 2024-07-07 (×9): 100 mg via ORAL
  Filled 2024-07-04 (×9): qty 1

## 2024-07-04 MED ORDER — SUCRALFATE 1 GM/10ML PO SUSP
1.0000 g | Freq: Three times a day (TID) | ORAL | Status: DC
  Administered 2024-07-04 – 2024-07-06 (×10): 1 g via ORAL
  Filled 2024-07-04 (×11): qty 10

## 2024-07-04 MED ORDER — INSULIN ASPART 100 UNIT/ML IJ SOLN
0.0000 [IU] | Freq: Three times a day (TID) | INTRAMUSCULAR | Status: DC
  Administered 2024-07-04: 11 [IU] via SUBCUTANEOUS
  Administered 2024-07-04: 3 [IU] via SUBCUTANEOUS

## 2024-07-04 MED ORDER — ENOXAPARIN SODIUM 40 MG/0.4ML IJ SOSY: 40.0000 mg | PREFILLED_SYRINGE | INTRAMUSCULAR | Status: DC

## 2024-07-04 MED ORDER — INSULIN GLARGINE 100 UNIT/ML ~~LOC~~ SOLN
20.0000 [IU] | Freq: Every day | SUBCUTANEOUS | Status: DC
  Administered 2024-07-04: 20 [IU] via SUBCUTANEOUS
  Filled 2024-07-04 (×2): qty 0.2

## 2024-07-04 NOTE — Plan of Care (Signed)

## 2024-07-04 NOTE — Inpatient Diabetes Management (Addendum)
 Inpatient Diabetes Program Recommendations  AACE/ADA: New Consensus Statement on Inpatient Glycemic Control   Target Ranges:  Prepandial:   less than 140 mg/dL      Peak postprandial:   less than 180 mg/dL (1-2 hours)      Critically ill patients:  140 - 180 mg/dL    Latest Reference Range & Units 07/03/24 16:46 07/03/24 17:59 07/03/24 19:51 07/03/24 23:26 07/04/24 03:15  Glucose-Capillary 70 - 99 mg/dL 841 (H) 839 (H) 734 (H) 299 (H) 318 (H)   Review of Glycemic Control  Diabetes history: DM2 Outpatient Diabetes medications: Glipizide  10mg  daily, Lantus  15 units daily Current orders for Inpatient glycemic control: Lantus  12 units daily and Novolog  0-9 units q4hrs.   Inpatient Diabetes Program Recommendations:   Noted: Patient received Lantus  12 units at 13:52 and Lantus  12 units at 23:20 on 9/27 and CBG up to 318 mg/dl at 6:84 am despite Novolog  correction Q4H.   Insulin : Consider increasing Lantus  to 20 units BID and add Novolog  4 units TID with meals.   Discharge Recommendations: Other recommendations: Glipizide (if continued outpatient) Long acting recommendations: Insulin  Glargine (LANTUS ) Solostar Pen to be determined  Short acting recommendations:  Correction coverage ONLY Insulin  lispro (HUMALOG) KwikPen  Sensitive Scale.   Hypoglycemia treatment recommendations: GVoke 1mg  Supply/Referral recommendations: Pen needles - standard   Use Adult Diabetes Insulin  Treatment Post Discharge order set.  Noted consult for Diabetes Coordinator. Diabetes Coordinator is not on campus over the weekend but available by pager from 8am to 5pm for questions or concerns.    Thanks,  Lavanda Search, RN, MSN, [facility]  Inpatient Diabetes Coordinator  Pager [phone] (8a-5p)
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
## 2024-07-04 NOTE — Care Management Obs Status (Signed)
 MEDICARE OBSERVATION STATUS NOTIFICATION   Patient Details  Name: Andrew [name] MRN: [id number] Date of Birth: 10-28-1965   Medicare Observation Status Notification Given:  Yes    Marval Gell, RN 07/04/2024, 7:44 AM

## 2024-07-05 ENCOUNTER — Observation Stay (HOSPITAL_COMMUNITY)

## 2024-07-05 DIAGNOSIS — N3000 Acute cystitis without hematuria: Secondary | ICD-10-CM | POA: Diagnosis not present

## 2024-07-05 DIAGNOSIS — M48061 Spinal stenosis, lumbar region without neurogenic claudication: Secondary | ICD-10-CM | POA: Diagnosis not present

## 2024-07-05 DIAGNOSIS — M51379 Other intervertebral disc degeneration, lumbosacral region without mention of lumbar back pain or lower extremity pain: Secondary | ICD-10-CM | POA: Diagnosis not present

## 2024-07-05 DIAGNOSIS — M5126 Other intervertebral disc displacement, lumbar region: Secondary | ICD-10-CM | POA: Diagnosis not present

## 2024-07-05 DIAGNOSIS — K2101 Gastro-esophageal reflux disease with esophagitis, with bleeding: Secondary | ICD-10-CM | POA: Diagnosis not present

## 2024-07-05 DIAGNOSIS — N179 Acute kidney failure, unspecified: Secondary | ICD-10-CM | POA: Diagnosis not present

## 2024-07-05 DIAGNOSIS — E1100 Type 2 diabetes mellitus with hyperosmolarity without nonketotic hyperglycemic-hyperosmolar coma (NKHHC): Secondary | ICD-10-CM | POA: Diagnosis not present

## 2024-07-05 DIAGNOSIS — M51369 Other intervertebral disc degeneration, lumbar region without mention of lumbar back pain or lower extremity pain: Secondary | ICD-10-CM | POA: Diagnosis not present

## 2024-07-05 LAB — RAPID URINE DRUG SCREEN, HOSP PERFORMED
Amphetamines: NOT DETECTED
Barbiturates: NOT DETECTED
Benzodiazepines: NOT DETECTED
Cocaine: NOT DETECTED
Opiates: NOT DETECTED
Tetrahydrocannabinol: NOT DETECTED

## 2024-07-05 LAB — BASIC METABOLIC PANEL WITH GFR
Anion gap: 5 (ref 5–15)
BUN: 12 mg/dL (ref 6–20)
CO2: 24 mmol/L (ref 22–32)
Calcium: 8.2 mg/dL — ABNORMAL LOW (ref 8.9–10.3)
Chloride: 104 mmol/L (ref 98–111)
Creatinine, Ser: 0.9 mg/dL (ref 0.61–1.24)
GFR, Estimated: >60 mL/min (ref >60)
Glucose, Bld: 345 mg/dL — ABNORMAL HIGH (ref 70–99)
Potassium: 3.3 mmol/L — ABNORMAL LOW (ref 3.5–5.1)
Sodium: 133 mmol/L — ABNORMAL LOW (ref 135–145)

## 2024-07-05 LAB — GLUCOSE, CAPILLARY
Glucose-Capillary: 446 mg/dL — ABNORMAL HIGH (ref 70–99)
Glucose-Capillary: 417 mg/dL — ABNORMAL HIGH (ref 70–99)
Glucose-Capillary: 129 mg/dL — ABNORMAL HIGH (ref 70–99)
Glucose-Capillary: 223 mg/dL — ABNORMAL HIGH (ref 70–99)

## 2024-07-05 LAB — HIV ANTIBODY (ROUTINE TESTING W REFLEX): HIV Screen 4th Generation wRfx: NONREACTIVE

## 2024-07-05 LAB — HEMOGLOBIN A1C
Hgb A1c MFr Bld: >15.5 % — ABNORMAL HIGH (ref 4.8–5.6)
Mean Plasma Glucose: >398 mg/dL

## 2024-07-05 MED ORDER — INSULIN GLARGINE 100 UNIT/ML ~~LOC~~ SOLN
26.0000 [IU] | Freq: Every day | SUBCUTANEOUS | Status: DC
  Filled 2024-07-05: qty 0.26

## 2024-07-05 MED ORDER — INSULIN GLARGINE 100 UNIT/ML ~~LOC~~ SOLN
24.0000 [IU] | Freq: Every day | SUBCUTANEOUS | Status: DC
Start: 2024-07-05 — End: 2024-07-05
  Administered 2024-07-05: 24 [IU] via SUBCUTANEOUS
  Filled 2024-07-05: qty 0.24

## 2024-07-05 MED ORDER — INSULIN ASPART 100 UNIT/ML IJ SOLN
0.0000 [IU] | Freq: Three times a day (TID) | INTRAMUSCULAR | Status: DC
  Administered 2024-07-05: 3 [IU] via SUBCUTANEOUS
  Administered 2024-07-05 – 2024-07-06 (×2): 20 [IU] via SUBCUTANEOUS
  Administered 2024-07-06: 11 [IU] via SUBCUTANEOUS
  Administered 2024-07-07: 4 [IU] via SUBCUTANEOUS

## 2024-07-05 MED ORDER — INSULIN ASPART 100 UNIT/ML IJ SOLN
20.0000 [IU] | Freq: Once | INTRAMUSCULAR | Status: AC
  Administered 2024-07-05: 20 [IU] via SUBCUTANEOUS

## 2024-07-05 MED ORDER — INSULIN ASPART 100 UNIT/ML IJ SOLN
6.0000 [IU] | Freq: Three times a day (TID) | INTRAMUSCULAR | Status: DC
  Administered 2024-07-05 – 2024-07-07 (×5): 6 [IU] via SUBCUTANEOUS

## 2024-07-05 MED ORDER — LORAZEPAM 2 MG/ML IJ SOLN
1.0000 mg | Freq: Once | INTRAMUSCULAR | Status: AC | PRN
  Administered 2024-07-05: 1 mg via INTRAVENOUS
  Filled 2024-07-05: qty 1

## 2024-07-05 MED ORDER — GADOBUTROL 1 MMOL/ML IV SOLN
10.0000 mL | Freq: Once | INTRAVENOUS | Status: AC | PRN
Start: 2024-07-05 — End: 2024-07-05
  Administered 2024-07-05: 10 mL via INTRAVENOUS

## 2024-07-05 MED ORDER — POTASSIUM CHLORIDE CRYS ER 20 MEQ PO TBCR
40.0000 meq | EXTENDED_RELEASE_TABLET | Freq: Once | ORAL | Status: AC
  Administered 2024-07-05: 40 meq via ORAL
  Filled 2024-07-05: qty 2

## 2024-07-05 NOTE — Progress Notes (Signed)
 OT Cancellation Note  Patient Details Name: Andrew [name] MRN: [id number] DOB: 1966-03-29   Cancelled Treatment:    Reason Eval/Treat Not Completed: OT screened, no needs identified, will sign off Discussed with PT. Pt without need for OT services at this time. Please reconsult if needs change.   Mliss Fish 07/05/2024, 8:30 AM
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
## 2024-07-05 NOTE — Inpatient Diabetes Management (Signed)
 Inpatient Diabetes Program Recommendations  AACE/ADA: New Consensus Statement on Inpatient Glycemic Control (2015)  Target Ranges:  Prepandial:   less than 140 mg/dL      Peak postprandial:   less than 180 mg/dL (1-2 hours)      Critically ill patients:  140 - 180 mg/dL   Lab Results  Component Value Date   GLUCAP 446 (H) 07/05/2024   HGBA1C 14.5 (H) 01/11/2022    Review of Glycemic Control  Latest Reference Range & Units 07/04/24 08:35 07/04/24 12:06 07/04/24 17:27 07/04/24 23:05 07/05/24 09:08  Glucose-Capillary 70 - 99 mg/dL 813 (H) 680 (H) 804 (H) 223 (H) 446 (H)  (H): Data is abnormally high  Diabetes history: DM2 Outpatient Diabetes medications: Glipizide  10mg  daily, Lantus  15 units daily Current orders for Inpatient glycemic control: Lantus  12 units daily and Novolog  0-9 units q4hrs.  Inpatient Diabetes Program Recommendations:    Please consider:  Lantus  24 units BID  Thank you, Wyvonna Pinal, MSN, CDCES Diabetes Coordinator Inpatient Diabetes Program [phone] (team pager from 8a-5p)

## 2024-07-05 NOTE — Plan of Care (Signed)
  Problem: Coping: Goal: Ability to adjust to condition or change in health will improve Outcome: Progressing   Problem: Fluid Volume: Goal: Ability to maintain a balanced intake and output will improve Outcome: Progressing   Problem: Health Behavior/Discharge Planning: Goal: Ability to manage health-related needs will improve Outcome: Progressing   Problem: Metabolic: Goal: Ability to maintain appropriate glucose levels will improve Outcome: Progressing   Problem: Nutritional: Goal: Maintenance of adequate nutrition will improve Outcome: Progressing   Problem: Nutritional: Goal: Maintenance of adequate nutrition will improve Outcome: Progressing   Problem: Education: Goal: Knowledge of General Education information will improve Description: Including pain rating scale, medication(s)/side effects and non-pharmacologic comfort measures Outcome: Progressing

## 2024-07-06 ENCOUNTER — Other Ambulatory Visit (HOSPITAL_COMMUNITY): Payer: Self-pay

## 2024-07-06 DIAGNOSIS — K920 Hematemesis: Secondary | ICD-10-CM | POA: Diagnosis not present

## 2024-07-06 DIAGNOSIS — N179 Acute kidney failure, unspecified: Secondary | ICD-10-CM | POA: Diagnosis not present

## 2024-07-06 LAB — GC/CHLAMYDIA PROBE AMP (~~LOC~~) NOT AT ARMC
Chlamydia: NEGATIVE
Comment: NEGATIVE
Comment: NORMAL
Neisseria Gonorrhea: NEGATIVE

## 2024-07-06 LAB — BASIC METABOLIC PANEL WITH GFR
Anion gap: 9 (ref 5–15)
BUN: 9 mg/dL (ref 6–20)
CO2: 23 mmol/L (ref 22–32)
Calcium: 8.4 mg/dL — ABNORMAL LOW (ref 8.9–10.3)
Chloride: 103 mmol/L (ref 98–111)
Creatinine, Ser: 0.81 mg/dL (ref 0.61–1.24)
GFR, Estimated: >60 mL/min (ref >60)
Glucose, Bld: 319 mg/dL — ABNORMAL HIGH (ref 70–99)
Potassium: 3.9 mmol/L (ref 3.5–5.1)
Sodium: 135 mmol/L (ref 135–145)

## 2024-07-06 LAB — GLUCOSE, CAPILLARY
Glucose-Capillary: 423 mg/dL — ABNORMAL HIGH (ref 70–99)
Glucose-Capillary: 265 mg/dL — ABNORMAL HIGH (ref 70–99)
Glucose-Capillary: 64 mg/dL — ABNORMAL LOW (ref 70–99)
Glucose-Capillary: 66 mg/dL — ABNORMAL LOW (ref 70–99)
Glucose-Capillary: 122 mg/dL — ABNORMAL HIGH (ref 70–99)
Glucose-Capillary: 274 mg/dL — ABNORMAL HIGH (ref 70–99)
Glucose-Capillary: 231 mg/dL — ABNORMAL HIGH (ref 70–99)

## 2024-07-06 LAB — MAGNESIUM: Magnesium: 1.8 mg/dL (ref 1.7–2.4)

## 2024-07-06 LAB — VITAMIN B12: Vitamin B-12: 438 pg/mL (ref 180–914)

## 2024-07-06 MED ORDER — INSULIN GLARGINE 100 UNIT/ML ~~LOC~~ SOLN
32.0000 [IU] | Freq: Every day | SUBCUTANEOUS | Status: DC
  Administered 2024-07-06 – 2024-07-07 (×2): 32 [IU] via SUBCUTANEOUS
  Filled 2024-07-06 (×2): qty 0.32

## 2024-07-06 MED ORDER — BLOOD GLUCOSE MONITOR SYSTEM W/DEVICE KIT
1.0000 | PACK | Freq: Three times a day (TID) | 0 refills | Status: AC
  Filled 2024-07-06: qty 1, 30d supply, fill #0

## 2024-07-06 MED ORDER — SUCRALFATE 1 GM/10ML PO SUSP
1.0000 g | Freq: Three times a day (TID) | ORAL | 0 refills | Status: AC
  Filled 2024-07-06: qty 560, 14d supply, fill #0

## 2024-07-06 MED ORDER — GABAPENTIN 300 MG PO CAPS
300.0000 mg | ORAL_CAPSULE | Freq: Three times a day (TID) | ORAL | 2 refills | Status: AC
  Filled 2024-07-06: qty 90, 30d supply, fill #0

## 2024-07-06 MED ORDER — DOXYCYCLINE HYCLATE 100 MG PO TABS
100.0000 mg | ORAL_TABLET | Freq: Two times a day (BID) | ORAL | 0 refills | Status: AC
  Filled 2024-07-06: qty 14, 7d supply, fill #0

## 2024-07-06 MED ORDER — LANTUS SOLOSTAR 100 UNIT/ML ~~LOC~~ SOPN
32.0000 [IU] | PEN_INJECTOR | Freq: Every day | SUBCUTANEOUS | 11 refills | Status: DC
  Filled 2024-07-06: qty 15, 46d supply, fill #0

## 2024-07-06 MED ORDER — DOXYCYCLINE HYCLATE 100 MG PO TABS
100.0000 mg | ORAL_TABLET | Freq: Two times a day (BID) | ORAL | Status: DC
  Administered 2024-07-06 – 2024-07-07 (×3): 100 mg via ORAL
  Filled 2024-07-06 (×3): qty 1

## 2024-07-06 MED ORDER — ALBUTEROL SULFATE HFA 108 (90 BASE) MCG/ACT IN AERS
2.0000 | INHALATION_SPRAY | Freq: Four times a day (QID) | RESPIRATORY_TRACT | 1 refills | Status: AC | PRN
  Filled 2024-07-06: qty 18, 25d supply, fill #0

## 2024-07-06 MED ORDER — INSULIN GLARGINE 100 UNIT/ML ~~LOC~~ SOLN
30.0000 [IU] | Freq: Every day | SUBCUTANEOUS | Status: DC
Start: 2024-07-06 — End: 2024-07-06

## 2024-07-06 MED ORDER — LANCET DEVICE MISC
1.0000 | Freq: Three times a day (TID) | 0 refills | Status: AC
  Filled 2024-07-06: qty 1, 30d supply, fill #0

## 2024-07-06 MED ORDER — INSULIN PEN NEEDLE 32G X 4 MM MISC
1.0000 | 0 refills | Status: AC | PRN
  Filled 2024-07-06: qty 200, 30d supply, fill #0

## 2024-07-06 MED ORDER — BLOOD GLUCOSE TEST VI STRP
1.0000 | ORAL_STRIP | Freq: Three times a day (TID) | 0 refills | Status: AC
Start: 2024-07-06 — End: 2024-08-09
  Filled 2024-07-06: qty 100, 34d supply, fill #0

## 2024-07-06 MED ORDER — PANTOPRAZOLE SODIUM 40 MG PO TBEC
40.0000 mg | DELAYED_RELEASE_TABLET | Freq: Two times a day (BID) | ORAL | 1 refills | Status: AC
  Filled 2024-07-06: qty 60, 30d supply, fill #0

## 2024-07-06 MED ORDER — METFORMIN HCL 500 MG PO TABS
500.0000 mg | ORAL_TABLET | Freq: Two times a day (BID) | ORAL | 1 refills | Status: AC
  Filled 2024-07-06: qty 60, 30d supply, fill #0

## 2024-07-06 MED ORDER — ACCU-CHEK SOFTCLIX LANCETS MISC
1.0000 | Freq: Three times a day (TID) | 0 refills | Status: AC
  Filled 2024-07-06: qty 100, 30d supply, fill #0

## 2024-07-06 NOTE — TOC Initial Note (Addendum)
 Transition of Care [facility]) - Initial/Assessment Note    Patient Details  Name: Andrew [name] MRN: [id number] Date of Birth: April 06, 1966  Transition of Care [facility]) CM/SW Contact:    Inocente GORMAN Kindle, LCSW Phone Number: 07/06/2024, 12:08 PM  Clinical Narrative:                 CSW received homeless consult. CSW met with patient who stated he has been staying in the woods or the sidewalk. He stated he has no family or friends and does not receive any income. CSW asked which housing agencies he has contacted for assistance but patient stated none. CSW provided homeless shelters and SA resources and recommended patient follow up to complete assessments and get on waitlists. CSW also made him aware of the [facility].  He stated his friend is giving him a ride after work and a place to stay just for one night.   CSW updated patient's cell phone number in the system and completed referrals through the [employer] platform with patient's consent, including to [employer] and [employer].    Expected Discharge Plan: Home/Self Care Barriers to Discharge: No Barriers Identified   Patient Goals and CMS Choice            Expected Discharge Plan and Services In-house Referral: Clinical Social Work     Living arrangements for the past 2 months: Homeless                                      Prior Living Arrangements/Services Living arrangements for the past 2 months: Homeless Lives with:: Self Patient language and need for interpreter reviewed:: Yes Do you feel safe going back to the place where you live?: Yes      Need for Family Participation in Patient Care: No (Comment) Care giver support system in place?: No (comment)   Criminal Activity/Legal Involvement Pertinent to Current Situation/Hospitalization: No - Comment as needed  Activities of Daily Living   ADL Screening (condition at time of admission) Independently performs ADLs?: Yes  (appropriate for developmental age) Is the patient deaf or have difficulty hearing?: No Does the patient have difficulty seeing, even when wearing glasses/contacts?: No Does the patient have difficulty concentrating, remembering, or making decisions?: No  Permission Sought/Granted                  Emotional Assessment Appearance:: Appears stated age Attitude/Demeanor/Rapport: Guarded Affect (typically observed): Guarded Orientation: : Oriented to Self, Oriented to Place, Oriented to  Time, Oriented to Situation Alcohol / Substance Use: Illicit Drugs Psych Involvement: No (comment)  Admission diagnosis:  Hyperglycemia [R73.9] Esophagitis [K20.90] Elevated beta-hydroxybutyric acid level [R78.89] Type 2 diabetes mellitus with hyperosmolar hyperglycemic state (HHS) (HCC) [E11.00] Patient Active Problem List   Diagnosis Date Noted   Type 2 diabetes mellitus with hyperosmolar hyperglycemic state (HHS) (HCC) 07/03/2024   AKI (acute kidney injury) 07/03/2024   Urinary tract infection 07/03/2024   Delusion (HCC) 07/03/2024   Hematemesis 07/03/2024   GERD with esophagitis 07/03/2024   Drug overdose 01/08/2021   Type 2 diabetes mellitus with complication, without long-term current use of insulin  (HCC) 05/25/2020   Educated about COVID-19 virus infection 02/23/2020   Crack cocaine overdose (HCC) 09/28/2017   Hypoglycemia 09/28/2017   Hepatitis 09/28/2017   Leucocytosis 09/28/2017   Overdose 09/28/2017   Arthritis of knee, degenerative 01/24/2015   Right knee  pain 01/07/2014   Concern about STD in male without diagnosis 01/06/2014   Dysuria 12/09/2013   High cholesterol 10/25/2013   GERD (gastroesophageal reflux disease) 09/20/2013   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 [place] [address] Phone: [phone] Fax: [phone]     Social Drivers of Health (SDOH) Social History: SDOH Screenings   Food Insecurity: No Food Insecurity (07/03/2024)  Housing: Low Risk  (07/03/2024)  Transportation Needs: Unmet Transportation Needs (07/03/2024)  Utilities: Not At Risk (07/03/2024)  Depression (PHQ2-9): Low Risk  (05/30/2023)  Social Connections: Moderately Integrated (07/03/2024)  Tobacco Use: High Risk (07/03/2024)   SDOH Interventions: Transportation Interventions: Community Resources Provided   Readmission Risk Interventions     No data to display
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
## 2024-07-06 NOTE — Discharge Instructions (Signed)
 In a time of Crisis: Integrated family Services/Therapeutic Alternatives.  Mobile Crisis Management provides immediate crisis response, 24/7.  Call [phone] Depending on the county.  [facility] for MH/DD/[facility] is available 24 hours a day, 7 days a week. Customer Service Specialists will assist you to find a crisis provider that is well-matched with your needs. Your local number is: [phone]  [facility] ([facility]) IOP, individual counseling, medication management [phone] [place], [place] [address] [phone] Call for intake hours; Medicaid and Uninsured    Outpatient Providers  [facility] ([facility]) Group and individual counseling. [address]  [place], [place] [address] [phone] [place]: [phone]  [place]: [phone] Medicaid and uninsured.   [facility] Offers IOP groups multiple times per week. [address] [place], [place] [address] [phone] Takes Medicaid and other insurances.   [facility]  Chemical Dependency Intensive Outpatient Program (IOP) [address] [place], [place] [address] [phone] Takes Nurse, learning disability and [place].   [facility]  IOP and Partial Hospitalization Program  [address]  [place], [place] [address] [phone] Private Insurance, [place] only for partial hospitalization  [facility] [address], [place], [place] [address] [phone] Monday-Friday. Short and Long term options. [facility] ([facility]) IOP, individual counseling, medication management [address] [place], [place] [address] [phone] Medicaid and [facility]  [facility] [address]  [place], [place] [address] [phone] Private Insurance and Self Pay    [facility] Out[facility] [address]  [place], [place] [address] [phone] Private Insurance, [place], and Self Pay   [facility]  [address], [place] [address] [facility]  [address]  [place], [place] [address] [phone]  [facility]  [address] [place], [place] [address] [phone]  [facility]  Locations in [place], [place], [place], [place].  [phone] Accepts all insurances and some uninsured.   Residential Treatment Programs  [facility] [address] [place], [place] [address] [phone] or [phone] Detox and Residential Rehab 21 days (Medicaid, private insurance, and self pay. If Medicare, will look into funding). No methadone. Call for pre-screen.   [facility]) [address]  [place], [place] [address] [phone] Detox 3-7 days (self Pay and Medicaid Limited). Transitional Program for females needs 60 days clean first.  Rehab Only for Males 60 days (Medicare, and Self Pay)-No methadone.  [facility] [address] [place], [place] [address] [phone] or [phone] Private Insurance only  [facility] PHONE: [phone] FAX: [phone] Residential program for women 21 and over for up to a year through a Christian 12-step recovery model. Self-pay.    [facility] [address] [place], [place] [address] Phone:  [phone] Must be detoxed 72 hours prior to admission; 28 day program.  Self-pay.  [facility] [address]  [place], [place] [phone] [employer], Medicare, [place] (not straight [place]). They offer assistance with transportation.   [facility] [address] [place],  [place], [place] [address] [phone] Christian Based Program. Men only. No insurance  [facility] [address] [place], [place] [address] Women's: [phone] Men's: [phone] No Medicaid.   [facility]  [address]  [place], [place] [address] [phone] Treatment Only, must make assessment appointment, and must be sober for assessment appointment. Self pay, [facility], must be [facility] resident. No methadone.   [facility]  [address] [place], [place] [address] [phone] No pending legal charges, Long-term work program. No methadone. Call for assessment.  [facility]  [address], [place], [place] [address] [phone] or [phone] Commercial Insurance Only  [facility] - [phone] Private Insurance (no [place]). Males/Females, call to make referrals, multiple facilities.   [facility] [address]  [place], [place] [address]  [phone] Men Only Upfront Fee  [facility]-no methadone: [facility] [address] [place], [place] [address] [phone] ([phone] (f)  [facility] Locations across the [place] (mainly [place] ) willing to help with transportation.  [phone] [employer].     AA Meetings Meeting Locator:  [email or web address] Also can download an app on that website.  [facility] Program Due to COVID-19, syringe services programs are likely operating under different hours with limited or no fixed site hours. Some programs may not be operating at all. Please contact the program directly using the phone numbers provided below to see if they are still operating under COVID-19. [facility] Solution to the Opioid Problem (GCSTOP) Fixed; mobile; peer-based [place] [phone] [email or web address] Fixed site exchange at [facility], [address] [place], [place] [address] on Wednesdays (2:00 - 5:00 pm) and Thursdays  (4:00 - 8:00 pm). Pop-up mobile exchange locations: [employer], [address] [place], [place] [address] on Tuesdays (11:00 am - 1:00 pm) and Fridays (11:00 am - 1:00 pm) -[facility] - [address], [place], [place] [address] on Tuesdays (2:00 - 4:00 pm) and Fridays (2:00 - 4:00 pm) -[facility] -Fixed; mobile; peer-based [phone] [address], [place], [place] [address] Delivery and outreach available in [place] and [place], please call for more information. Monday, Tuesday: 1:00 -7:00 pm, Thursday: 4:00 pm - 8:00 pm, Friday: 1:00 pm - 8:00 pm) Medication-Assisted Treatment (MAT) -[facility]: Locations in [place], [place], [place], [place]. Accepts all insurances and some uninsured. Methadone, Suboxone, etc. Closed on weekends. Walk ins before 11am.  Call [phone]  -[facility]- services [place] and surrounding areas including [place], [place], [place], [place], [address], [address], [place], [place], [place], and [place], [place]. Options include Methadone, buprenorphine or Suboxone. [address], [place], [place] [address] Phone: [phone] [place] - Fri: 5:30am - 2:00pm, Sat: 5:30am -7:30am, Sun: Closed  -[facility]- We use FDA-approved medications, like methadone/suboxone/sublocade, and vivitrol. These medications are then combined with customized care plans that include individual or group counseling, toxicology, and medical care directed by on-site physicians. Accepts most insurance plans, Medicaid, and private pay.  [address] [place], [place] [address] Phone: [phone] Monday-Friday 5:00 AM - 10:00 AM, Saturday 6:00 AM - 8:30 AM, Sunday 6:00 AM - 7:00 AM  -[facility]- [facility] is a treatment & recovery focused program. In addition to receiving methadone medication, our clients participate in individual and group counseling as well as random  drug testing. If accepted into the [facility] Opioid Program, you will be provided several intake appointments and a physical exam [address] [place], [place] [address] Office: [phone]  Fax: [phone]  -[facility]- We put our community members at the  center of everything we do, for remote treatment services as well as in-person, from alcohol withdrawal to opioid use and more.  [address], [place], [place] [address] [phone] Monday-Wednesday: 9:00am - 5:00pm, Thursday: 9:00am - 6:00pm, Friday: 9:00am - 5:00pm Saturday: 9:00am - 1:00pm, Sunday: Closed  -[facility]- Our clinic in [place], a medication unit, offers daily dosing of methadone or buprenorphine in addition to our clinic in [place] offering counseling to help people overcome addiction to heroin and other opiates. We also offer psychiatric services including medication management, and an office based suboxone program. [address], [place], [place] [address] Phone [phone]  [facility]-We treat Opioid Addiction using medications that are a combination of buprenorphine and naloxone, which are used to treat adults addicted to narcotic painkillers and drugs such as heroin. They reduce the intense cravings and painful symptoms that accompany withdrawal. [address], [place], [place] Phone: [phone]  -[facility] (Or [place]): $12/daily for Methadone Treatment.  [address], [place], [place] [address] [phone] [address] [place], [place] [address]  -[facility]: Suboxone only.  [address], [place], [place] [address] P. [phone]  -[facility] [address] [place], [place] [address] P. [phone]

## 2024-07-06 NOTE — Plan of Care (Signed)
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
## 2024-07-06 NOTE — Progress Notes (Signed)
 No major issues overnight-discussed with RN-she has seen patient walking without assistance.  Discussed with Chiropodist of nursing-he also has seen patient walking without assistance Vital stable On exam-appears comfortable Neurological exam-5/5 all over-when distracted-fluently moving all 4 extremities-easily able to push himself off the foot of the bed MRI LS spine unremarkable Social work eval-and then discharge later today.

## 2024-07-07 ENCOUNTER — Other Ambulatory Visit (HOSPITAL_COMMUNITY): Payer: Self-pay

## 2024-07-07 DIAGNOSIS — N179 Acute kidney failure, unspecified: Secondary | ICD-10-CM | POA: Diagnosis not present

## 2024-07-07 DIAGNOSIS — R739 Hyperglycemia, unspecified: Secondary | ICD-10-CM

## 2024-07-07 DIAGNOSIS — K2101 Gastro-esophageal reflux disease with esophagitis, with bleeding: Secondary | ICD-10-CM | POA: Diagnosis not present

## 2024-07-07 LAB — BASIC METABOLIC PANEL WITH GFR
Anion gap: 5 (ref 5–15)
BUN: 7 mg/dL (ref 6–20)
CO2: 26 mmol/L (ref 22–32)
Calcium: 8.7 mg/dL — ABNORMAL LOW (ref 8.9–10.3)
Chloride: 103 mmol/L (ref 98–111)
Creatinine, Ser: 0.76 mg/dL (ref 0.61–1.24)
GFR, Estimated: >60 mL/min (ref >60)
Glucose, Bld: 175 mg/dL — ABNORMAL HIGH (ref 70–99)
Potassium: 3.9 mmol/L (ref 3.5–5.1)
Sodium: 134 mmol/L — ABNORMAL LOW (ref 135–145)

## 2024-07-07 LAB — GLUCOSE, CAPILLARY
Glucose-Capillary: 160 mg/dL — ABNORMAL HIGH (ref 70–99)
Glucose-Capillary: 188 mg/dL — ABNORMAL HIGH (ref 70–99)
Glucose-Capillary: 234 mg/dL — ABNORMAL HIGH (ref 70–99)

## 2024-07-07 MED ORDER — LANTUS SOLOSTAR 100 UNIT/ML ~~LOC~~ SOPN
30.0000 [IU] | PEN_INJECTOR | Freq: Every day | SUBCUTANEOUS | 11 refills | Status: AC
  Filled 2024-07-07: qty 15, 50d supply, fill #0

## 2024-07-07 MED ORDER — INSULIN LISPRO (1 UNIT DIAL) 100 UNIT/ML (KWIKPEN)
PEN_INJECTOR | SUBCUTANEOUS | 0 refills | Status: AC
  Filled 2024-07-07: qty 15, 25d supply, fill #0

## 2024-07-07 NOTE — Progress Notes (Signed)
 Patient was coming up with reasons not to leave.  Waiting on insulin .  Then will go to lounge

## 2024-07-07 NOTE — TOC Transition Note (Signed)
 Transition of Care [facility]) - Discharge Note   Patient Details  Name: Andrew [name] MRN: [id number] Date of Birth: Mar 18, 1966  Transition of Care [facility]) CM/SW Contact:  Marval Gell, RN Phone Number: 07/07/2024, 8:41 AM   Clinical Narrative:      Patient to DC to home today, provided with resources for SDOH through CSW.  Requested CMA to schedule follow up appointment and call patient with date and time.   Final next level of care: Home/Self Care Barriers to Discharge: No Barriers Identified   Patient Goals and CMS Choice            Discharge Placement                       Discharge Plan and Services Additional resources added to the After Visit Summary for   In-house Referral: Clinical Social Work                                   Social Drivers of Health (SDOH) Interventions SDOH Screenings   Food Insecurity: No Food Insecurity (07/03/2024)  Housing: Low Risk  (07/03/2024)  Transportation Needs: Unmet Transportation Needs (07/03/2024)  Utilities: Not At Risk (07/03/2024)  Depression (PHQ2-9): Low Risk  (05/30/2023)  Social Connections: Moderately Integrated (07/03/2024)  Tobacco Use: High Risk (07/03/2024)     Readmission Risk Interventions     No data to display

## 2024-07-07 NOTE — Plan of Care (Signed)
  Problem: Education: Goal: Ability to describe self-care measures that may prevent or decrease complications (Diabetes Survival Skills Education) will improve Outcome: Progressing   Problem: Coping: Goal: Ability to adjust to condition or change in health will improve Outcome: Progressing   Problem: Metabolic: Goal: Ability to maintain appropriate glucose levels will improve Outcome: Progressing

## 2024-08-18 ENCOUNTER — Emergency Department (HOSPITAL_COMMUNITY)

## 2024-08-18 ENCOUNTER — Encounter (HOSPITAL_COMMUNITY): Payer: Self-pay

## 2024-08-18 ENCOUNTER — Emergency Department (HOSPITAL_COMMUNITY)
Admission: EM | Admit: 2024-08-18 | Discharge: 2024-08-18 | Discharge status: Against Medical Advice | Attending: Emergency Medicine | Admitting: Emergency Medicine

## 2024-08-18 ENCOUNTER — Emergency Department (HOSPITAL_COMMUNITY)
Admission: EM | Admit: 2024-08-18 | Discharge: 2024-08-19 | Disposition: A | Discharge status: Home / Self Care | Attending: Emergency Medicine | Admitting: Emergency Medicine

## 2024-08-18 ENCOUNTER — Other Ambulatory Visit: Payer: Self-pay

## 2024-08-18 DIAGNOSIS — F419 Anxiety disorder, unspecified: Secondary | ICD-10-CM | POA: Diagnosis not present

## 2024-08-18 DIAGNOSIS — F22 Delusional disorders: Secondary | ICD-10-CM | POA: Diagnosis not present

## 2024-08-18 DIAGNOSIS — R451 Restlessness and agitation: Secondary | ICD-10-CM | POA: Diagnosis not present

## 2024-08-18 DIAGNOSIS — F319 Bipolar disorder, unspecified: Secondary | ICD-10-CM | POA: Diagnosis not present

## 2024-08-18 DIAGNOSIS — Z5321 Procedure and treatment not carried out due to patient leaving prior to being seen by health care provider: Secondary | ICD-10-CM | POA: Diagnosis not present

## 2024-08-18 DIAGNOSIS — F1420 Cocaine dependence, uncomplicated: Secondary | ICD-10-CM | POA: Diagnosis not present

## 2024-08-18 DIAGNOSIS — M25532 Pain in left wrist: Secondary | ICD-10-CM | POA: Insufficient documentation

## 2024-08-18 DIAGNOSIS — Z794 Long term (current) use of insulin: Secondary | ICD-10-CM | POA: Diagnosis not present

## 2024-08-18 DIAGNOSIS — F19250 Other psychoactive substance dependence with psychoactive substance-induced psychotic disorder with delusions: Secondary | ICD-10-CM | POA: Diagnosis not present

## 2024-08-18 DIAGNOSIS — F1990 Other psychoactive substance use, unspecified, uncomplicated: Secondary | ICD-10-CM

## 2024-08-18 DIAGNOSIS — R0789 Other chest pain: Secondary | ICD-10-CM | POA: Diagnosis present

## 2024-08-18 DIAGNOSIS — Z7984 Long term (current) use of oral hypoglycemic drugs: Secondary | ICD-10-CM | POA: Diagnosis not present

## 2024-08-18 DIAGNOSIS — Z79899 Other long term (current) drug therapy: Secondary | ICD-10-CM | POA: Diagnosis not present

## 2024-08-18 DIAGNOSIS — F15959 Other stimulant use, unspecified with stimulant-induced psychotic disorder, unspecified: Secondary | ICD-10-CM | POA: Insufficient documentation

## 2024-08-18 DIAGNOSIS — F19959 Other psychoactive substance use, unspecified with psychoactive substance-induced psychotic disorder, unspecified: Secondary | ICD-10-CM | POA: Diagnosis not present

## 2024-08-18 DIAGNOSIS — F29 Unspecified psychosis not due to a substance or known physiological condition: Secondary | ICD-10-CM

## 2024-08-18 LAB — CBC WITH DIFFERENTIAL/PLATELET
Abs Immature Granulocytes: 0.04 K/uL (ref 0.00–0.07)
Basophils Absolute: 0 K/uL (ref 0.0–0.1)
Basophils Relative: 0 %
Eosinophils Absolute: 0 K/uL (ref 0.0–0.5)
Eosinophils Relative: 0 %
HCT: 38 % — ABNORMAL LOW (ref 39.0–52.0)
Hemoglobin: 12.4 g/dL — ABNORMAL LOW (ref 13.0–17.0)
Immature Granulocytes: 0 %
Lymphocytes Relative: 9 %
Lymphs Abs: 1 K/uL (ref 0.7–4.0)
MCH: 30.5 pg (ref 26.0–34.0)
MCHC: 32.6 g/dL (ref 30.0–36.0)
MCV: 93.6 fL (ref 80.0–100.0)
Monocytes Absolute: 1.5 K/uL — ABNORMAL HIGH (ref 0.1–1.0)
Monocytes Relative: 13 %
Neutro Abs: 8.9 K/uL — ABNORMAL HIGH (ref 1.7–7.7)
Neutrophils Relative %: 78 %
Platelets: 295 K/uL (ref 150–400)
RBC: 4.06 MIL/uL — ABNORMAL LOW (ref 4.22–5.81)
RDW: 13.3 % (ref 11.5–15.5)
WBC: 11.5 K/uL — ABNORMAL HIGH (ref 4.0–10.5)
nRBC: 0 % (ref 0.0–0.2)

## 2024-08-18 LAB — COMPREHENSIVE METABOLIC PANEL WITH GFR
ALT: 11 U/L (ref 0–44)
AST: 31 U/L (ref 15–41)
Albumin: 4 g/dL (ref 3.5–5.0)
Alkaline Phosphatase: 87 U/L (ref 38–126)
Anion gap: 17 — ABNORMAL HIGH (ref 5–15)
BUN: 25 mg/dL — ABNORMAL HIGH (ref 6–20)
CO2: 20 mmol/L — ABNORMAL LOW (ref 22–32)
Calcium: 9.4 mg/dL (ref 8.9–10.3)
Chloride: 102 mmol/L (ref 98–111)
Creatinine, Ser: 1.21 mg/dL (ref 0.61–1.24)
GFR, Estimated: >60 mL/min (ref >60)
Glucose, Bld: 118 mg/dL — ABNORMAL HIGH (ref 70–99)
Potassium: 4.1 mmol/L (ref 3.5–5.1)
Sodium: 138 mmol/L (ref 135–145)
Total Bilirubin: 1 mg/dL (ref 0.0–1.2)
Total Protein: 7.2 g/dL (ref 6.5–8.1)

## 2024-08-18 LAB — ETHANOL: Alcohol, Ethyl (B): <15 mg/dL (ref <15)

## 2024-08-18 MED ORDER — RISPERIDONE 0.5 MG PO TBDP: 2.0000 mg | ORAL_TABLET | Freq: Three times a day (TID) | ORAL | Status: DC | PRN

## 2024-08-18 MED ORDER — INSULIN ASPART 100 UNIT/ML IJ SOLN
0.0000 [IU] | Freq: Three times a day (TID) | INTRAMUSCULAR | Status: DC
  Administered 2024-08-19: 5 [IU] via SUBCUTANEOUS
  Administered 2024-08-19: 2 [IU] via SUBCUTANEOUS
  Filled 2024-08-18: qty 5
  Filled 2024-08-18: qty 2

## 2024-08-18 MED ORDER — NICOTINE 21 MG/24HR TD PT24: 21.0000 mg | MEDICATED_PATCH | Freq: Every day | TRANSDERMAL | Status: DC

## 2024-08-18 MED ORDER — ONDANSETRON HCL 4 MG PO TABS: 4.0000 mg | ORAL_TABLET | Freq: Three times a day (TID) | ORAL | Status: DC | PRN

## 2024-08-18 MED ORDER — ACETAMINOPHEN 325 MG PO TABS: 650.0000 mg | ORAL_TABLET | ORAL | Status: DC | PRN

## 2024-08-18 MED ORDER — ALUM & MAG HYDROXIDE-SIMETH 200-200-20 MG/5ML PO SUSP: 30.0000 mL | Freq: Four times a day (QID) | ORAL | Status: DC | PRN

## 2024-08-18 MED ORDER — LORAZEPAM 1 MG PO TABS: 1.0000 mg | ORAL_TABLET | ORAL | Status: DC | PRN

## 2024-08-18 MED ORDER — STERILE WATER FOR INJECTION IJ SOLN
INTRAMUSCULAR | Status: AC
  Filled 2024-08-18: qty 10

## 2024-08-18 MED ORDER — INSULIN ASPART 100 UNIT/ML IJ SOLN: 0.0000 [IU] | Freq: Every day | INTRAMUSCULAR | Status: DC

## 2024-08-18 MED ORDER — ZIPRASIDONE MESYLATE 20 MG IM SOLR
20.0000 mg | INTRAMUSCULAR | Status: AC | PRN
  Administered 2024-08-18: 20 mg via INTRAMUSCULAR
  Filled 2024-08-18: qty 20

## 2024-08-18 MED ORDER — ZOLPIDEM TARTRATE 5 MG PO TABS: 5.0000 mg | ORAL_TABLET | Freq: Every evening | ORAL | Status: DC | PRN
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
## 2024-08-18 NOTE — ED Notes (Signed)
 The patient is refusing to allow me to check his blood sugar at this time.

## 2024-08-18 NOTE — ED Provider Triage Note (Signed)
 Emergency Medicine Provider Triage Evaluation Note  Andrew [name] , a 58 y.o. male  was evaluated in triage.  Pt complains of chest tightness, and feeling like someone has dosed him and would like labs and a x-ray done to evaluate for this.  Patient without SI, HI.  Review of Systems  Positive: Chest tightness Negative: SI, HI  Physical Exam  BP (!) 117/94 (BP Location: Right Arm)   Pulse (!) 133   Resp 18   Ht 5' 7 (1.702 m)   Wt 95.3 kg   SpO2 96%   BMI 32.89 kg/m  Gen:   Awake, no distress   Resp:  Normal effort  MSK:   Moves extremities without difficulty   Medical Decision Making  Medically screening exam initiated at 2:53 PM.  Appropriate orders placed.  Andrew [name] was informed that the remainder of the evaluation will be completed by another provider, this initial triage assessment does not replace that evaluation, and the importance of remaining in the ED until their evaluation is complete.  Patient is paranoid, does believe that someone has dosed him unclear whether or not this is a delusion, however given patient is tachycardic, do feel that patient warrants screening labs and imaging.  Patient is linear, no SI, HI, therefore do not feel that patient requires IVC emergently at this time   Andrew Jerilynn RAMAN, MD 08/18/24 1500

## 2024-08-18 NOTE — BH Assessment (Signed)
 TTS Consult will be completed by IRIS. IRIS Coordinator will communicate in chat assessment time and provider name. Thanks

## 2024-08-18 NOTE — ED Notes (Signed)
 Pt was seen leaving department w/ [employer]/Security.

## 2024-08-18 NOTE — ED Provider Notes (Signed)
 [place] EMERGENCY DEPARTMENT AT [facility] Provider Note   CSN: [id number] Arrival date & time: 08/18/24  1948     Patient presents with: Wrist Pain (left)   ROMIR KLIMOWICZ is a 58 y.o. male.   The history is provided by the patient, the EMS personnel, the police and medical records. No language interpreter was used.  Wrist Pain     58 year old male with significant history of bipolar, cocaine abuse, diabetes brought here via EMS accompanied by [employer] with concerns of altered mentation.  Patient was initially detained by police for abnormal behaviors, he was acting bizarrely and does not seem to follow any direction.  EMS was initially called to have his blood sugar checked but then patient request to come to the ER for drug detox and wrists pain.  It is difficult to obtain history from patient.  He is requesting for help with detox from drugs.  Admits to drug use but I was unable to obtain medical history.  He is complaining of pain to both wrists and ankles.  Currently not complaining of any SI or HI however patient is a poor historian.  Prior to Admission medications   Medication Sig Start Date End Date Taking? Authorizing Provider  albuterol  (VENTOLIN  HFA) 108 (90 Base) MCG/ACT inhaler Inhale 2 puffs into the lungs every 6 (six) hours as needed for wheezing or shortness of breath. 07/06/24   Ghimire, Donalda HERO, MD  Blood Glucose Monitoring Suppl (BLOOD GLUCOSE MONITOR SYSTEM) w/Device KIT Use in the morning, at noon, and at bedtime. 07/06/24   Ghimire, Donalda HERO, MD  gabapentin  (NEURONTIN ) 300 MG capsule Take 1 capsule (300 mg total) by mouth 3 (three) times daily. 07/06/24   Ghimire, Donalda HERO, MD  insulin  lispro (HUMALOG KWIKPEN) 100 UNIT/ML KwikPen 0-9 Units, Subcutaneous, 3 times daily with meals CBG < 70: Implement Hypoglycemia measures CBG 70 - 120: 0 units CBG 121 - 150: 3 units CBG 151 - 200: 4 units CBG 201 - 250: 7 units CBG 251 - 300: 11 units CBG 301 - 350: 15 units  CBG 351 - 400: 20 units   CBG > 400: call MD 07/07/24   Raenelle Donalda HERO, MD  Insulin  Pen Needle 32G X 4 MM MISC Use as needed. 07/06/24   Ghimire, Donalda HERO, MD  LANTUS  SOLOSTAR 100 UNIT/ML Solostar Pen Inject 30 Units into the skin daily. 07/07/24   Ghimire, Donalda HERO, MD  metFORMIN  (GLUCOPHAGE ) 500 MG tablet Take 1 tablet (500 mg total) by mouth 2 (two) times daily with a meal. 07/06/24   Ghimire, Donalda HERO, MD  pantoprazole  (PROTONIX ) 40 MG tablet Take 1 tablet (40 mg total) by mouth 2 (two) times daily. Take twice daily for 6 weeks, then switch to once daily dosing 07/06/24 07/06/25  Ghimire, Donalda HERO, MD  sucralfate (CARAFATE) 1 GM/10ML suspension Take 10 mLs (1 g total) by mouth 4 (four) times daily -  with meals and at bedtime for 14 days. 07/06/24 07/20/24  GhimireDonalda HERO, MD    Allergies: Mushroom extract complex (obsolete), Metformin , and Penicillins    Review of Systems  Unable to perform ROS: Mental status change    Updated Vital Signs BP (!) 129/99   Pulse 100   Temp 98.9 F (37.2 C) (Oral)   Resp 16   Ht 5' 7 (1.702 m)   Wt 95.3 kg   SpO2 99%   BMI 32.91 kg/m   Physical Exam Constitutional:  General: He is not in acute distress.    Appearance: He is well-developed.     Comments: Patient is laying on chair, holding the remote control as if it was a phone and was having conversation to the remote control.  He is squirming about on the chair moving all 4 extremities.  HENT:     Head: Atraumatic.  Eyes:     Conjunctiva/sclera: Conjunctivae normal.  Cardiovascular:     Rate and Rhythm: Tachycardia present.     Pulses: Normal pulses.     Heart sounds: Normal heart sounds.  Pulmonary:     Effort: Pulmonary effort is normal.     Breath sounds: Normal breath sounds.  Abdominal:     Palpations: Abdomen is soft.  Musculoskeletal:     Cervical back: Normal range of motion and neck supple.  Skin:    Findings: No rash.  Neurological:     Mental Status: He is  alert.     GCS: GCS eye subscore is 4. GCS verbal subscore is 5. GCS motor subscore is 6.  Psychiatric:        Mood and Affect: Mood is anxious.        Speech: Speech is rapid and pressured.        Behavior: Behavior is agitated.        Thought Content: Thought content is paranoid. Thought content does not include homicidal or suicidal ideation.        Cognition and Memory: Cognition is impaired.     (all labs ordered are listed, but only abnormal results are displayed) Labs Reviewed  COMPREHENSIVE METABOLIC PANEL WITH GFR - Abnormal; Notable for the following components:      Result Value   CO2 20 (*)    Glucose, Bld 118 (*)    BUN 25 (*)    Anion gap 17 (*)    All other components within normal limits  ETHANOL  URINE DRUG SCREEN  CBC WITH DIFFERENTIAL/PLATELET    EKG: None ED ECG REPORT   Date: 08/18/2024  Rate: 94  Rhythm: normal sinus rhythm  QRS Axis: left  Intervals: normal  ST/T Wave abnormalities: nonspecific ST changes  Conduction Disutrbances:none  Narrative Interpretation:   Old EKG Reviewed: unchanged  I have personally reviewed the EKG tracing and agree with the computerized printout as noted.   Radiology: No results found.   Procedures   Medications Ordered in the ED  risperiDONE (RISPERDAL M-TABS) disintegrating tablet 2 mg (has no administration in time range)    And  LORazepam  (ATIVAN ) tablet 1 mg (has no administration in time range)    And  ziprasidone (GEODON) injection 20 mg (has no administration in time range)  acetaminophen  (TYLENOL ) tablet 650 mg (has no administration in time range)  zolpidem  (AMBIEN ) tablet 5 mg (has no administration in time range)  ondansetron  (ZOFRAN ) tablet 4 mg (has no administration in time range)  alum & mag hydroxide-simeth (MAALOX/MYLANTA) 200-200-20 MG/5ML suspension 30 mL (has no administration in time range)  nicotine  (NICODERM CQ  - dosed in mg/24 hours) patch 21 mg (has no administration in time range)   insulin  aspart (novoLOG ) injection 0-15 Units (has no administration in time range)  insulin  aspart (novoLOG ) injection 0-5 Units (has no administration in time range)                                    Medical Decision Making Amount and/or  Complexity of Data Reviewed Labs: ordered.  Risk OTC drugs. Prescription drug management.   BP (!) 129/99   Pulse 100   Temp 98.9 F (37.2 C) (Oral)   Resp 16   Ht 5' 7 (1.702 m)   Wt 95.3 kg   SpO2 99%   BMI 32.91 kg/m   33:57 PM  58 year old male with significant history of bipolar, cocaine abuse, diabetes brought here via EMS accompanied by [employer] with concerns of altered mentation.  Patient was initially detained by police for abnormal behaviors, he was acting bizarrely and does not seem to follow any direction.  EMS was initially called to have his blood sugar checked but then patient request to come to the ER for drug detox and wrists pain.  It is difficult to obtain history from patient.  He is requesting for help with detox from drugs.  Admits to drug use but I was unable to obtain medical history.  He is complaining of pain to both wrists and ankles.  Currently not complaining of any SI or HI however patient is a poor historian.  On exam patient is exhibiting altered mental status.  He is holding a remote control thinking that it was a fall and was having conversation to bed.  He is moving all 4 extremities.  Responding to both internal and external stimuli.  Examinations of all 4 extremities without any obvious signs of injury and patient is able to move all 4 extremities.  He is neurovascularly intact.  No signs of skin infection either.  EMR reviewed patient was seen at [facility], ER earlier in the day with paranoia believing that someone has dosed him however he eloped prior to full evaluation.  Suspect patient is having drug-induced psychosis  Will perform medical screening exam  -Labs ordered, independently viewed and  interpreted by me.  Labs remarkable for anion gap of 17, possibly due to starvation ketosis as pt sts he's hungry and cbg is 118.  CBC and UDS currently pending -The patient was maintained on a cardiac monitor.  I personally viewed and interpreted the cardiac monitored which showed an underlying rhythm of: NSR -Imaging including head CT scan and wrist xrays considered but felt it is low yield -This patient presents to the ED for concern of altered mental status, this involves an extensive number of treatment options, and is a complaint that carries with it a high risk of complications and morbidity.  The differential diagnosis includes drug induced psychosis, metabolic derangement, delirium, depression, bipolar, schizophrenia -Co morbidities that complicate the patient evaluation includes substance abuse, bipolar, delusion -Treatment includes supportive care -Reevaluation of the patient after these medicines showed that the patient stayed the same -PCP office notes or outside notes reviewed -Discussion with specialist including TTS and psychiatry for psych eval.  Pt sign out to oncoming provider who will f/u on the remainder of the labs -Escalation to admission/observation considered: anticipate psych eval and determination of disposition as pt is pending medical clearance.        Final diagnoses:  Psychosis, unspecified psychosis type [facility])  Polysubstance use disorder    ED Discharge Orders     None          Nivia Colon, PA-C 08/18/24 2206    Doretha Folks, MD 08/18/24 2359

## 2024-08-18 NOTE — ED Triage Notes (Signed)
 [place]IB [place]. Pt was detained by [employer] for strange behavior. Pt was being non compliant. Ems was called for blood sugar check and pt wanted to be transported to hospital for left wrist pain. CBG 116. Pt has been non compliant with ems. Pt has multiple complaints to include wound on right great toe and left wrist pain. Pt will not answer questions during triage.

## 2024-08-19 ENCOUNTER — Encounter (HOSPITAL_COMMUNITY): Payer: Self-pay | Admitting: Psychiatry

## 2024-08-19 DIAGNOSIS — F1420 Cocaine dependence, uncomplicated: Secondary | ICD-10-CM | POA: Diagnosis not present

## 2024-08-19 DIAGNOSIS — F19959 Other psychoactive substance use, unspecified with psychoactive substance-induced psychotic disorder, unspecified: Secondary | ICD-10-CM | POA: Diagnosis not present

## 2024-08-19 DIAGNOSIS — F19250 Other psychoactive substance dependence with psychoactive substance-induced psychotic disorder with delusions: Secondary | ICD-10-CM | POA: Diagnosis not present

## 2024-08-19 LAB — CBG MONITORING, ED
Glucose-Capillary: 128 mg/dL — ABNORMAL HIGH (ref 70–99)
Glucose-Capillary: 203 mg/dL — ABNORMAL HIGH (ref 70–99)
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
## 2024-08-19 NOTE — ED Provider Notes (Signed)
 Physical Exam  BP (!) 129/99   Pulse 100   Temp 98.9 F (37.2 C) (Oral)   Resp 16   Ht 5' 7 (1.702 m)   Wt 95.3 kg   SpO2 99%   BMI 32.91 kg/m   Physical Exam Vitals and nursing note reviewed.  Constitutional:      Appearance: He is not toxic-appearing.  Eyes:     General: No scleral icterus. Pulmonary:     Effort: Pulmonary effort is normal. No respiratory distress.  Skin:    General: Skin is warm and dry.  Neurological:     General: No focal deficit present.     Mental Status: He is alert.  Psychiatric:        Behavior: Behavior is uncooperative, agitated and aggressive.        Thought Content: Thought content is paranoid.     Comments: Patient is acutely paranoid and is having agitation and aggression towards staff.     Procedures  Procedures  ED Course / MDM   Clinical Course as of 08/19/24 0705  Thu Aug 19, 2024  0702 Needs UDS. Provided cup but has not provided sample. AMS, provided Geodon. Suspecting drug abuse. Not under IVC, likely will need IVC if trying to run.  [CB]    Clinical Course User Index [CB] Beola Terrall RAMAN, PA-C   Medical Decision Making Amount and/or Complexity of Data Reviewed Labs: ordered.  Risk OTC drugs. Prescription drug management.   Accepted handoff at shift change from Tran PA-C. Please see prior provider note for more detail.   Briefly: Patient is 58 y.o. male presents to the Emergency Department today for evaluation of blood sugar check was found to be acutely paranoid.  Lab work CBC and UDS pending.  Thought this is likely more drug use into psychosis however patient does have known psychiatric disorder.  The patient came in voluntarily.  IVC not filled out.  Previous clinical research associate thinks that anion gap is from starvation ketosis and can be replenished with p.o. fluids.  Patient is refusing blood glucose checks.  This is in the nursing to hold insulin  in the meantime.  UDS will need to be collected.  Patient did urinate here  however did not get a sample.  The patient was agitated and did require Geodon for he is medically clear for psych evaluation.  However I do feel this is likely polysubstance use given his history, will obtain UDS for clarification.   Handing to on coming shift, Beola PA-C to follow up with results.  Results for orders placed or performed during the hospital encounter of 08/18/24  Comprehensive metabolic panel   Collection Time: 08/18/24  8:45 PM  Result Value Ref Range   Sodium 138 135 - 145 mmol/L   Potassium 4.1 3.5 - 5.1 mmol/L   Chloride 102 98 - 111 mmol/L   CO2 20 (L) 22 - 32 mmol/L   Glucose, Bld 118 (H) 70 - 99 mg/dL   BUN 25 (H) 6 - 20 mg/dL   Creatinine, Ser 8.78 0.61 - 1.24 mg/dL   Calcium  9.4 8.9 - 10.3 mg/dL   Total Protein 7.2 6.5 - 8.1 g/dL   Albumin 4.0 3.5 - 5.0 g/dL   AST 31 15 - 41 U/L   ALT 11 0 - 44 U/L   Alkaline Phosphatase 87 38 - 126 U/L   Total Bilirubin 1.0 0.0 - 1.2 mg/dL   GFR, Estimated >39 >39 mL/min   Anion gap 17 (H) 5 -  15  Ethanol   Collection Time: 08/18/24  8:45 PM  Result Value Ref Range   Alcohol, Ethyl (B) <15 <15 mg/dL  CBC with Diff   Collection Time: 08/18/24  8:45 PM  Result Value Ref Range   WBC 11.5 (H) 4.0 - 10.5 K/uL   RBC 4.06 (L) 4.22 - 5.81 MIL/uL   Hemoglobin 12.4 (L) 13.0 - 17.0 g/dL   HCT 61.9 (L) 60.9 - 47.9 %   MCV 93.6 80.0 - 100.0 fL   MCH 30.5 26.0 - 34.0 pg   MCHC 32.6 30.0 - 36.0 g/dL   RDW 86.6 88.4 - 84.4 %   Platelets 295 150 - 400 K/uL   nRBC 0.0 0.0 - 0.2 %   Neutrophils Relative % 78 %   Neutro Abs 8.9 (H) 1.7 - 7.7 K/uL   Lymphocytes Relative 9 %   Lymphs Abs 1.0 0.7 - 4.0 K/uL   Monocytes Relative 13 %   Monocytes Absolute 1.5 (H) 0.1 - 1.0 K/uL   Eosinophils Relative 0 %   Eosinophils Absolute 0.0 0.0 - 0.5 K/uL   Basophils Relative 0 %   Basophils Absolute 0.0 0.0 - 0.1 K/uL   Immature Granulocytes 0 %   Abs Immature Granulocytes 0.04 0.00 - 0.07 K/uL   No results found.          Bernis Ernst, PA-C 08/19/24 9293    Raford Lenis, MD 08/19/24 [phone]

## 2024-08-19 NOTE — ED Notes (Addendum)
 Patient dressed out and belongings placed in the cabinet behind nurses station. Patient has retained possession of his dentures.

## 2024-08-19 NOTE — ED Notes (Signed)
 Writer informed pt a urine screen was needed. Pt was asked to provide one and he advised he would not.

## 2024-08-19 NOTE — ED Provider Notes (Signed)
  Physical Exam  BP 108/70 (BP Location: Left Arm)   Pulse 71   Temp 98.1 F (36.7 C) (Oral)   Resp 17   Ht 5' 7 (1.702 m)   Wt 95.3 kg   SpO2 98%   BMI 32.91 kg/m   Physical Exam Vitals and nursing note reviewed.  Constitutional:      General: He is not in acute distress.    Appearance: Normal appearance. He is not ill-appearing or diaphoretic.  Eyes:     General:        Right eye: No discharge.        Left eye: No discharge.     Extraocular Movements: Extraocular movements intact.     Conjunctiva/sclera: Conjunctivae normal.  Cardiovascular:     Rate and Rhythm: Normal rate.  Pulmonary:     Effort: Pulmonary effort is normal. No respiratory distress.  Musculoskeletal:     Cervical back: Normal range of motion.  Skin:    General: Skin is warm.     Coloration: Skin is not jaundiced.     Findings: No bruising or erythema.  Neurological:     General: No focal deficit present.     Mental Status: He is alert and oriented to person, place, and time. Mental status is at baseline.  Psychiatric:        Mood and Affect: Mood normal.        Behavior: Behavior normal.        Thought Content: Thought content normal.        Judgment: Judgment normal.     Procedures  Procedures  ED Course / MDM   Clinical Course as of 08/19/24 1143  Thu Aug 19, 2024  0702 Needs UDS. Provided cup but has not provided sample. AMS, provided Geodon. Suspecting drug abuse. Not under IVC, likely will need IVC if trying to run.  [CB]    Clinical Course User Index [CB] Beola Terrall RAMAN, PA-C   Medical Decision Making Amount and/or Complexity of Data Reviewed Labs: ordered.  Risk OTC drugs. Prescription drug management.   See previous provider note.  Patient care transferred over from [facility], PA-C.  At time of handoff, awaiting psychiatric eval and UDS.  Patient does not wish to provide UDS at this time.  Upon my evaluation, patient appears to be alert and oriented 4, with denied  hallucinations, HI, SI.  Patient does not appear to be altered at this time, likely had metabolized off any drug use.  Seen by psychiatry and cleared.  Patient is medically cleared at this time.  Low suspicion for any emergent cause or pathology present at this time for patient symptoms or reported symptoms.  Resources were provided for the patient.  Patient vital signs have remained stable throughout the course of patient's time in the ED. Low suspicion for any other emergent pathology at this time. I believe this patient is safe to be discharged. Provided strict return to ER precautions. Patient expressed agreement and understanding of plan. All questions were answered.       Beola Terrall RAMAN, PA-C 08/19/24 1145    Patsey Lot, MD 08/19/24 [phone]

## 2024-08-19 NOTE — Discharge Instructions (Addendum)
 Please consider substance abuse treatment, if/when amenable Please adhere to safety return precautions discussed today Please consider abstinence from illicit substance use Please consider following up with shelter resources given upon discharge

## 2024-08-19 NOTE — BH Assessment (Addendum)
 [id number] patient refused.  2339 IRIS made another attempt to complete TTS assessment, Sherifat, NP, "patient is disorganized and unable to answer questions. I recommend that he be medicated and reassigned for evaluation when he is able to participate.

## 2024-10-18 NOTE — Progress Notes (Signed)
 Andrew [name]                                          MRN: [id number]   10/18/2024   The [facility] Quality Team Specialist reviewed this patient medical record for the purposes of chart review for care gap closure. The following were reviewed: chart review for care gap closure-glycemic status assessment.    [facility] Quality Team

## 2024-11-02 NOTE — Progress Notes (Signed)
 Andrew [name]                                          MRN: [id number]   11/02/2024   The [facility] Quality Team Specialist reviewed this patient medical record for the purposes of chart review for care gap closure. The following were reviewed: chart review for care gap closure-glycemic status assessment.    [facility] Quality Team
